# Patient Record
Sex: Female | Born: 1950 | Race: Black or African American | Hispanic: No | Marital: Married | Smoking: Former smoker
Health system: Southern US, Community
[De-identification: ages and names within clinical notes are randomized; demographics above are authoritative.]

## PROBLEM LIST (undated history)

## (undated) DIAGNOSIS — I2510 Atherosclerotic heart disease of native coronary artery without angina pectoris: Secondary | ICD-10-CM

## (undated) DIAGNOSIS — E0500 Thyrotoxicosis with diffuse goiter without thyrotoxic crisis or storm: Secondary | ICD-10-CM

## (undated) DIAGNOSIS — J45909 Unspecified asthma, uncomplicated: Secondary | ICD-10-CM

## (undated) DIAGNOSIS — K219 Gastro-esophageal reflux disease without esophagitis: Secondary | ICD-10-CM

## (undated) DIAGNOSIS — F32A Depression, unspecified: Secondary | ICD-10-CM

## (undated) DIAGNOSIS — R519 Headache, unspecified: Secondary | ICD-10-CM

## (undated) DIAGNOSIS — E785 Hyperlipidemia, unspecified: Secondary | ICD-10-CM

## (undated) DIAGNOSIS — Z87891 Personal history of nicotine dependence: Secondary | ICD-10-CM

## (undated) DIAGNOSIS — E042 Nontoxic multinodular goiter: Secondary | ICD-10-CM

## (undated) DIAGNOSIS — R072 Precordial pain: Secondary | ICD-10-CM

## (undated) DIAGNOSIS — E119 Type 2 diabetes mellitus without complications: Secondary | ICD-10-CM

## (undated) DIAGNOSIS — I10 Essential (primary) hypertension: Secondary | ICD-10-CM

## (undated) DIAGNOSIS — M1990 Unspecified osteoarthritis, unspecified site: Secondary | ICD-10-CM

## (undated) HISTORY — PX: ABDOMINAL HYSTERECTOMY: SHX81

## (undated) HISTORY — DX: Precordial pain: R07.2

## (undated) HISTORY — DX: Personal history of nicotine dependence: Z87.891

## (undated) HISTORY — PX: TONSILLECTOMY: SUR1361

## (undated) HISTORY — DX: Hyperlipidemia, unspecified: E78.5

## (undated) HISTORY — PX: EYE SURGERY: SHX253

## (undated) HISTORY — PX: CHOLECYSTECTOMY: SHX55

## (undated) HISTORY — DX: Atherosclerotic heart disease of native coronary artery without angina pectoris: I25.10

---

## 1993-03-12 DIAGNOSIS — K759 Inflammatory liver disease, unspecified: Secondary | ICD-10-CM

## 1993-03-12 HISTORY — DX: Inflammatory liver disease, unspecified: K75.9

## 2010-12-26 DIAGNOSIS — F1911 Other psychoactive substance abuse, in remission: Secondary | ICD-10-CM | POA: Insufficient documentation

## 2010-12-26 DIAGNOSIS — E119 Type 2 diabetes mellitus without complications: Secondary | ICD-10-CM | POA: Insufficient documentation

## 2010-12-26 DIAGNOSIS — F32A Depression, unspecified: Secondary | ICD-10-CM | POA: Diagnosis present

## 2010-12-26 DIAGNOSIS — M4802 Spinal stenosis, cervical region: Secondary | ICD-10-CM | POA: Insufficient documentation

## 2010-12-26 DIAGNOSIS — E785 Hyperlipidemia, unspecified: Secondary | ICD-10-CM | POA: Insufficient documentation

## 2010-12-26 DIAGNOSIS — I10 Essential (primary) hypertension: Secondary | ICD-10-CM | POA: Insufficient documentation

## 2010-12-26 DIAGNOSIS — H547 Unspecified visual loss: Secondary | ICD-10-CM | POA: Insufficient documentation

## 2011-04-17 DIAGNOSIS — H40009 Preglaucoma, unspecified, unspecified eye: Secondary | ICD-10-CM | POA: Insufficient documentation

## 2011-04-28 DIAGNOSIS — K219 Gastro-esophageal reflux disease without esophagitis: Secondary | ICD-10-CM | POA: Insufficient documentation

## 2011-06-21 DIAGNOSIS — J309 Allergic rhinitis, unspecified: Secondary | ICD-10-CM | POA: Insufficient documentation

## 2011-07-18 DIAGNOSIS — IMO0002 Reserved for concepts with insufficient information to code with codable children: Secondary | ICD-10-CM | POA: Insufficient documentation

## 2013-05-20 DIAGNOSIS — M5116 Intervertebral disc disorders with radiculopathy, lumbar region: Secondary | ICD-10-CM | POA: Insufficient documentation

## 2016-01-12 DIAGNOSIS — E042 Nontoxic multinodular goiter: Secondary | ICD-10-CM | POA: Insufficient documentation

## 2016-08-14 DIAGNOSIS — K760 Fatty (change of) liver, not elsewhere classified: Secondary | ICD-10-CM | POA: Insufficient documentation

## 2016-09-20 DIAGNOSIS — G8929 Other chronic pain: Secondary | ICD-10-CM | POA: Insufficient documentation

## 2017-02-26 ENCOUNTER — Ambulatory Visit: Payer: PRIVATE HEALTH INSURANCE

## 2017-02-28 ENCOUNTER — Ambulatory Visit: Payer: PRIVATE HEALTH INSURANCE

## 2017-03-22 ENCOUNTER — Encounter: Payer: Self-pay | Admitting: Emergency Medicine

## 2017-03-22 ENCOUNTER — Emergency Department
Admission: EM | Admit: 2017-03-22 | Discharge: 2017-03-22 | Disposition: A | Discharge status: Home / Self Care | Payer: Commercial Managed Care - HMO | Attending: Emergency Medicine | Admitting: Emergency Medicine

## 2017-03-22 ENCOUNTER — Other Ambulatory Visit: Payer: Self-pay

## 2017-03-22 DIAGNOSIS — I10 Essential (primary) hypertension: Secondary | ICD-10-CM | POA: Insufficient documentation

## 2017-03-22 DIAGNOSIS — F17210 Nicotine dependence, cigarettes, uncomplicated: Secondary | ICD-10-CM | POA: Insufficient documentation

## 2017-03-22 DIAGNOSIS — L309 Dermatitis, unspecified: Secondary | ICD-10-CM | POA: Diagnosis not present

## 2017-03-22 DIAGNOSIS — E119 Type 2 diabetes mellitus without complications: Secondary | ICD-10-CM | POA: Diagnosis not present

## 2017-03-22 DIAGNOSIS — L308 Other specified dermatitis: Secondary | ICD-10-CM | POA: Insufficient documentation

## 2017-03-22 DIAGNOSIS — R21 Rash and other nonspecific skin eruption: Secondary | ICD-10-CM | POA: Diagnosis present

## 2017-03-22 HISTORY — DX: Essential (primary) hypertension: I10

## 2017-03-22 HISTORY — DX: Type 2 diabetes mellitus without complications: E11.9

## 2017-03-22 MED ORDER — CLOTRIMAZOLE-BETAMETHASONE 1-0.05 % EX LOTN: TOPICAL_LOTION | Freq: Two times a day (BID) | CUTANEOUS | 0 refills | Status: DC

## 2017-03-22 NOTE — ED Triage Notes (Signed)
Pt reports that she fell a year ago and ever since has skin breakout since. She states that it started on the right first digit and is now on the 2nd digit also. Skin appears dry in appearance. States that the clinc was suppose to call to get her an appointment with dermatology but they have not called her.

## 2017-03-22 NOTE — ED Provider Notes (Signed)
[facility] Emergency Department Provider Note  ____________________________________________   First MD Initiated Contact with Patient 03/22/17 1303     (approximate)  I have reviewed the triage vital signs and the nursing notes.   HISTORY  Chief Complaint Finger Injury    HPI Erica Estrada is a 67 y.o. female complains of having a rash on her finger for several months, she was seen at her doctor's office 2 weeks ago and they are to refer to dermatology, she has not heard from them so she came to the ER to have it evaluated she reports that there is no drainage from the area is just dark and crackly, she denies fever chills   Past Medical History:  Diagnosis Date  . Diabetes mellitus without complication (HCC)   . Hypertension     There are no active problems to display for this patient.   Past Surgical History:  Procedure Laterality Date  . ABDOMINAL HYSTERECTOMY    . EYE SURGERY      Prior to Admission medications   Medication Sig Start Date End Date Taking? Authorizing Provider  clotrimazole-betamethasone (LOTRISONE) lotion Apply topically 2 (two) times daily. 03/22/17   Faythe Ghee, PA-C    Allergies Patient has no known allergies.  No family history on file.  Social History Social History   Tobacco Use  . Smoking status: Current Every Day Smoker    Packs/day: 0.50  Substance Use Topics  . Alcohol use: No    Frequency: Never  . Drug use: No    Review of Systems  Constitutional: No fever/chills Eyes: No visual changes. ENT: No sore throat. Respiratory: Denies cough Genitourinary: Negative for dysuria. Musculoskeletal: Negative for back pain. Skin: Positive for scaly skin on the middle and fourth finger    ____________________________________________   PHYSICAL EXAM:  VITAL SIGNS: ED Triage Vitals  Enc Vitals Group     BP 03/22/17 1242 140/73     Pulse Rate 03/22/17 1242 93     Resp 03/22/17 1242 20   Temp 03/22/17 1242 98 F (36.7 C)     Temp Source 03/22/17 1242 Oral     SpO2 03/22/17 1242 96 %     Weight 03/22/17 1244 177 lb (80.3 kg)     Height 03/22/17 1244 5\' 5"  (1.651 m)     Head Circumference --      Peak Flow --      Pain Score 03/22/17 1242 8     Pain Loc --      Pain Edu? --      Excl. in GC? --     Constitutional: Alert and oriented. Well appearing and in no acute distress. Eyes: Conjunctivae are normal.  Head: Atraumatic. Nose: No congestion/rhinnorhea. Mouth/Throat: Mucous membranes are moist.   Cardiovascular: Normal rate, regular rhythm.  Heart sounds are normal Respiratory: Normal respiratory effort.  No retractions, lungs clear to auscultation GU: deferred Musculoskeletal: FROM all extremities, warm and well perfused Neurologic:  Normal speech and language.  Skin:  Skin is warm, dry and intact.  There is dark thickened tissue on the inner aspect of the fingers that is covering the volar side of the middle finger, the area is not tender, there is no drainage, no vesicles noted neurovascular is intact Psychiatric: Mood and affect are normal. Speech and behavior are normal.  ____________________________________________   LABS (all labs ordered are listed, but only abnormal results are displayed)  Labs Reviewed - No data to display ____________________________________________  ____________________________________________  RADIOLOGY    ____________________________________________   PROCEDURES  Procedure(s) performed: No  Procedures    ____________________________________________   INITIAL IMPRESSION / ASSESSMENT AND PLAN / ED COURSE  Pertinent labs & imaging results that were available during my care of the patient were reviewed by me and considered in my medical decision making (see chart for details).  Patient is a 67 year old female who is concerned about a rash that is been on her finger for 7-8 months, there is no drainage, she saw  her family doctor 2 weeks ago and they are supposed to refer to dermatology  On physical exam the skin is rough, scaly, dark, questionable eczema versus fungal  The patient was given a prescription for Lotrisone cream to apply to the area, she is given the phone number to dermatology, she is to call for an appointment, advised her that she should call her family doctor first to see if they have already set up an appointment for her, patient states she understands comply with recommendations, she is discharged in stable condition     As part of my medical decision making, I reviewed the following data within the electronic MEDICAL RECORD NUMBER ____________________________________________   FINAL CLINICAL IMPRESSION(S) / ED DIAGNOSES  Final diagnoses:  Other eczema      NEW MEDICATIONS STARTED DURING THIS VISIT:  Discharge Medication List as of 03/22/2017  1:08 PM    START taking these medications   Details  clotrimazole-betamethasone (LOTRISONE) lotion Apply topically 2 (two) times daily., Starting Fri 03/22/2017, Print         Note:  This document was prepared using Dragon voice recognition software and may include unintentional dictation errors.    Faythe GheeFisher, Susan W, PA-C 03/22/17 1630    Jene EveryKinner, Robert, MD 03/25/17 1057

## 2017-03-22 NOTE — Discharge Instructions (Signed)
Use the medication as prescribed, follow up with dermatology

## 2017-03-26 DIAGNOSIS — L089 Local infection of the skin and subcutaneous tissue, unspecified: Secondary | ICD-10-CM | POA: Diagnosis not present

## 2017-03-26 DIAGNOSIS — L308 Other specified dermatitis: Secondary | ICD-10-CM | POA: Diagnosis not present

## 2017-04-08 DIAGNOSIS — R1110 Vomiting, unspecified: Secondary | ICD-10-CM | POA: Diagnosis not present

## 2017-04-12 ENCOUNTER — Emergency Department
Admission: EM | Admit: 2017-04-12 | Discharge: 2017-04-13 | Disposition: A | Discharge status: Home / Self Care | Payer: Medicare HMO | Attending: Emergency Medicine | Admitting: Emergency Medicine

## 2017-04-12 ENCOUNTER — Encounter: Payer: Self-pay | Admitting: Emergency Medicine

## 2017-04-12 ENCOUNTER — Other Ambulatory Visit: Payer: Self-pay

## 2017-04-12 DIAGNOSIS — R1030 Lower abdominal pain, unspecified: Secondary | ICD-10-CM | POA: Diagnosis not present

## 2017-04-12 DIAGNOSIS — R112 Nausea with vomiting, unspecified: Secondary | ICD-10-CM | POA: Diagnosis not present

## 2017-04-12 DIAGNOSIS — E119 Type 2 diabetes mellitus without complications: Secondary | ICD-10-CM | POA: Insufficient documentation

## 2017-04-12 DIAGNOSIS — F17200 Nicotine dependence, unspecified, uncomplicated: Secondary | ICD-10-CM | POA: Insufficient documentation

## 2017-04-12 DIAGNOSIS — I2510 Atherosclerotic heart disease of native coronary artery without angina pectoris: Secondary | ICD-10-CM | POA: Insufficient documentation

## 2017-04-12 DIAGNOSIS — I10 Essential (primary) hypertension: Secondary | ICD-10-CM | POA: Insufficient documentation

## 2017-04-12 DIAGNOSIS — R197 Diarrhea, unspecified: Secondary | ICD-10-CM | POA: Insufficient documentation

## 2017-04-12 DIAGNOSIS — R110 Nausea: Secondary | ICD-10-CM | POA: Diagnosis not present

## 2017-04-12 DIAGNOSIS — R1010 Upper abdominal pain, unspecified: Secondary | ICD-10-CM | POA: Diagnosis not present

## 2017-04-12 DIAGNOSIS — Z794 Long term (current) use of insulin: Secondary | ICD-10-CM | POA: Insufficient documentation

## 2017-04-12 LAB — BASIC METABOLIC PANEL
Anion gap: 11 (ref 5–15)
BUN: 19 mg/dL (ref 6–20)
CHLORIDE: 103 mmol/L (ref 101–111)
CO2: 21 mmol/L — AB (ref 22–32)
CREATININE: 1.09 mg/dL — AB (ref 0.44–1.00)
Calcium: 8.8 mg/dL — ABNORMAL LOW (ref 8.9–10.3)
GFR calc non Af Amer: 51 mL/min — ABNORMAL LOW (ref >60)
GFR, EST AFRICAN AMERICAN: 59 mL/min — AB (ref >60)
GLUCOSE: 270 mg/dL — AB (ref 65–99)
Potassium: 3.2 mmol/L — ABNORMAL LOW (ref 3.5–5.1)
Sodium: 135 mmol/L (ref 135–145)

## 2017-04-12 LAB — CBC
HCT: 37 % (ref 35.0–47.0)
Hemoglobin: 11.8 g/dL — ABNORMAL LOW (ref 12.0–16.0)
MCH: 26.4 pg (ref 26.0–34.0)
MCHC: 32 g/dL (ref 32.0–36.0)
MCV: 82.4 fL (ref 80.0–100.0)
PLATELETS: 255 10*3/uL (ref 150–440)
RBC: 4.49 MIL/uL (ref 3.80–5.20)
RDW: 15.8 % — ABNORMAL HIGH (ref 11.5–14.5)
WBC: 19.7 10*3/uL — ABNORMAL HIGH (ref 3.6–11.0)

## 2017-04-12 LAB — URINALYSIS, COMPLETE (UACMP) WITH MICROSCOPIC
Bilirubin Urine: NEGATIVE
Ketones, ur: NEGATIVE mg/dL
Leukocytes, UA: NEGATIVE
Nitrite: NEGATIVE
PROTEIN: NEGATIVE mg/dL
Specific Gravity, Urine: 1.025 (ref 1.005–1.030)
pH: 5 (ref 5.0–8.0)

## 2017-04-12 MED ORDER — ONDANSETRON HCL 4 MG/2ML IJ SOLN
4.0000 mg | Freq: Once | INTRAMUSCULAR | Status: DC
  Filled 2017-04-12: qty 2

## 2017-04-12 MED ORDER — SODIUM CHLORIDE 0.9 % IV BOLUS (SEPSIS): 1000.0000 mL | Freq: Once | INTRAVENOUS | Status: DC

## 2017-04-12 NOTE — ED Notes (Signed)
[facility] stat [facility] [employer] [facility].  Per EMS patient reported similar episode 3 days ago that lasted approximately 20-30 minutes.  VS:  HR - 94, BP - 124/72; pulse oxi 90% on room air; cbg - 326

## 2017-04-12 NOTE — ED Provider Notes (Signed)
[facility] Emergency Department Provider Note  ____________________________________________   First MD Initiated Contact with Patient 04/12/17 2335     (approximate)  I have reviewed the triage vital signs and the nursing notes.   HISTORY  Chief Complaint Emesis and Weakness   HPI Erica Fairyatricia Brucato is a 67 y.o. female who resents emergency department via EMS with nausea vomiting and upper abdominal pain that began at dinner tonight [place] [facility] [employer].  She began to eat her meal and she felt immediate severe nausea.  She stood up felt lightheaded and felt she might pass out.  She had cramping upper abdominal discomfort.  Nonradiating.  Her symptoms have nearly completely resolved now that she is here in the hospital.  No one else had identical symptoms.  She denies diarrhea.  She denies chest pain shortness of breath or palpitations.  Past Medical History:  Diagnosis Date  . Diabetes mellitus without complication (HCC)   . Hypertension     There are no active problems to display for this patient.   Past Surgical History:  Procedure Laterality Date  . ABDOMINAL HYSTERECTOMY    . EYE SURGERY      Prior to Admission medications   Medication Sig Start Date End Date Taking? Authorizing Provider  amLODipine (NORVASC) 10 MG tablet Take 1 tablet by mouth daily. 03/26/17  Yes [provider]  atorvastatin (LIPITOR) 80 MG tablet Take 1 tablet by mouth daily. 03/26/17  Yes [provider]  betamethasone dipropionate (DIPROLENE) 0.05 % cream Apply 1 application topically daily as needed. 04/01/17  Yes [provider]  clotrimazole-betamethasone (LOTRISONE) lotion Apply topically 2 (two) times daily. 03/22/17  Yes Fisher, Roselyn BeringSusan W, PA-C  JARDIANCE 10 MG TABS tablet Take 1 tablet by mouth daily. 03/26/17  Yes [provider]  metformin (FORTAMET) 1000 MG (OSM) 24 hr tablet Take 1 tablet by mouth 2 (two) times daily with a meal.   03/21/17  Yes [provider]  naproxen (NAPROSYN) 500 MG tablet Take 1 tablet by mouth 2 (two) times daily. 03/26/17  Yes [provider]  NOVOLOG FLEXPEN 100 UNIT/ML FlexPen Inject 20 Units into the skin 3 (three) times daily with meals. Plus sliding scale for up to 100 units TDD 03/22/17  Yes [provider]  omeprazole (PRILOSEC) 40 MG capsule Take 1 capsule by mouth daily. 04/08/17  Yes [provider]  SPIRIVA HANDIHALER 18 MCG inhalation capsule Place 1 capsule into inhaler and inhale daily. 03/22/17  Yes [provider]  TRESIBA FLEXTOUCH 200 UNIT/ML SOPN Inject 60 Units into the skin daily. 04/02/17  Yes [provider]  VENTOLIN HFA 108 (90 Base) MCG/ACT inhaler Inhale 2 puffs into the lungs every 6 (six) hours as needed. 03/26/17  Yes [provider]  insulin lispro (HUMALOG KWIKPEN) 100 UNIT/ML KiwkPen Inject 20 Units into the skin 3 (three) times daily. Plus sliding scale for up to 100 units TDD 04/18/16 04/18/17  [provider]  ondansetron (ZOFRAN ODT) 4 MG disintegrating tablet Take 1 tablet (4 mg total) by mouth every 8 (eight) hours as needed for nausea or vomiting. 04/13/17   Merrily Brittleifenbark, Haven Foss, MD    Allergies Patient has no known allergies.  No family history on file.  Social History Social History   Tobacco Use  . Smoking status: Current Every Day Smoker    Packs/day: 0.50  . Smokeless tobacco: Never Used  Substance Use Topics  . Alcohol use: No    Frequency: Never  .  Drug use: No    Review of Systems Constitutional: No fever/chills Eyes: No visual changes. ENT: No sore throat. Cardiovascular: Denies chest pain. Respiratory: Denies shortness of breath. Gastrointestinal: Positive for abdominal pain.  Positive for nausea, no vomiting.  No diarrhea.  No constipation. Genitourinary: Negative for dysuria. Musculoskeletal: Negative for back pain. Skin: Negative for rash. Neurological: Negative for  headaches, focal weakness or numbness.   ____________________________________________   PHYSICAL EXAM:  VITAL SIGNS: ED Triage Vitals  Enc Vitals Group     BP 04/12/17 1918 134/64     Pulse Rate 04/12/17 1918 94     Resp 04/12/17 1918 20     Temp 04/12/17 1918 99.1 F (37.3 C)     Temp Source 04/12/17 1918 Oral     SpO2 04/12/17 1918 94 %     Weight 04/12/17 1915 177 lb (80.3 kg)     Height 04/12/17 1915 5\' 5"  (1.651 m)     Head Circumference --      Peak Flow --      Pain Score 04/12/17 1915 9     Pain Loc --      Pain Edu? --      Excl. in GC? --     Constitutional: Alert and oriented x4 pleasant cooperative speaks in full clear sentences no diaphoresis Eyes: PERRL EOMI. Head: Atraumatic. Nose: No congestion/rhinnorhea. Mouth/Throat: No trismus Neck: No stridor.   Cardiovascular: Normal rate, regular rhythm. Grossly normal heart sounds.  Good peripheral circulation. Respiratory: Normal respiratory effort.  No retractions. Lungs CTAB and moving good air Gastrointestinal: Soft abdomen mild upper tenderness with no rebound or guarding no peritonitis no McBurney's tenderness negative Rovsing's no costovertebral tenderness Musculoskeletal: No lower extremity edema   Neurologic:  Normal speech and language. No gross focal neurologic deficits are appreciated. Skin:  Skin is warm, dry and intact. No rash noted. Psychiatric: Mood and affect are normal. Speech and behavior are normal.    ____________________________________________   DIFFERENTIAL includes but not limited to  Biliary colic, cholecystitis, cholangitis, appendicitis, diverticulitis, dehydration, food poisoning ____________________________________________   LABS (all labs ordered are listed, but only abnormal results are displayed)  Labs Reviewed  BASIC METABOLIC PANEL - Abnormal; Notable for the following components:      Result Value   Potassium 3.2 (*)    CO2 21 (*)    Glucose, Bld 270 (*)     Creatinine, Ser 1.09 (*)    Calcium 8.8 (*)    GFR calc non Af Amer 51 (*)    GFR calc Af Amer 59 (*)    All other components within normal limits  CBC - Abnormal; Notable for the following components:   WBC 19.7 (*)    Hemoglobin 11.8 (*)    RDW 15.8 (*)    All other components within normal limits  URINALYSIS, COMPLETE (UACMP) WITH MICROSCOPIC - Abnormal; Notable for the following components:   Color, Urine YELLOW (*)    APPearance CLOUDY (*)    Glucose, UA >=500 (*)    Hgb urine dipstick SMALL (*)    Bacteria, UA RARE (*)    Squamous Epithelial / LPF 0-5 (*)    All other components within normal limits  HEPATIC FUNCTION PANEL - Abnormal; Notable for the following components:   ALT 10 (*)    Bilirubin, Direct <0.1 (*)    All other components within normal limits  GLUCOSE, CAPILLARY - Abnormal; Notable for the following components:   Glucose-Capillary 253 (*)  All other components within normal limits  LIPASE, BLOOD  CBG MONITORING, ED    Lab work reviewed by me with elevated white count which is nonspecific and could be secondary to infection versus pain __________________________________________  EKG  ED ECG REPORT I, Merrily Brittle, the attending physician, personally viewed and interpreted this ECG.  Date: 04/12/2017 EKG Time:  Rate: 95 Rhythm: normal sinus rhythm QRS Axis: Rightward axis Intervals: normal ST/T Wave abnormalities: normal Narrative Interpretation: no evidence of acute ischemia  ____________________________________________  RADIOLOGY  CT abdomen pelvis reviewed by me with no acute disease ____________________________________________   PROCEDURES  Procedure(s) performed: no  Procedures  Critical Care performed: no  Observation: no ____________________________________________   INITIAL IMPRESSION / ASSESSMENT AND PLAN / ED COURSE  Pertinent labs & imaging results that were available during my care of the patient were reviewed by  me and considered in my medical decision making (see chart for details).  Currently the patient is relatively asymptomatic and well-appearing, however postprandial abdominal pain diarrhea vomiting and diaphoresis is concerning.  She does have a white count of 20.  CT abdomen pelvis is pending now.    ----------------------------------------- 1:26 AM on 04/13/2017 -----------------------------------------  Fortunately the patient's LFTs and CT scan were reassuring.  Her symptoms are completely resolved.  She is able to eat and drink.  She likely had food poisoning from dinner.  I will discharge her home with a short course of Zofran and strict return precautions.  She verbalizes understanding and agreement with the plan.  ____________________________________________   FINAL CLINICAL IMPRESSION(S) / ED DIAGNOSES  Final diagnoses:  Nausea vomiting and diarrhea      NEW MEDICATIONS STARTED DURING THIS VISIT:  Discharge Medication List as of 04/13/2017  1:26 AM    START taking these medications   Details  ondansetron (ZOFRAN ODT) 4 MG disintegrating tablet Take 1 tablet (4 mg total) by mouth every 8 (eight) hours as needed for nausea or vomiting., Starting Sat 04/13/2017, Print         Note:  This document was prepared using Dragon voice recognition software and may include unintentional dictation errors.     Merrily Brittle, MD 04/14/17 [phone]

## 2017-04-12 NOTE — ED Triage Notes (Signed)
Pt reports that she had some red emesis "the week before last" but felt like she was getting better. [facility] [employer] and was feeling nauseated and felt like she was going to pass out. Pt reports extreme weakness without LOC. Pt is diabetic and reports EMS CBG of 236. Pt is in NAD.

## 2017-04-12 NOTE — ED Notes (Signed)
ED Provider at bedside. 

## 2017-04-13 ENCOUNTER — Encounter: Payer: Self-pay | Admitting: Radiology

## 2017-04-13 ENCOUNTER — Emergency Department: Payer: Medicare HMO

## 2017-04-13 DIAGNOSIS — R110 Nausea: Secondary | ICD-10-CM | POA: Diagnosis not present

## 2017-04-13 LAB — HEPATIC FUNCTION PANEL
ALK PHOS: 104 U/L (ref 38–126)
ALT: 10 U/L — AB (ref 14–54)
AST: 31 U/L (ref 15–41)
Albumin: 3.7 g/dL (ref 3.5–5.0)
BILIRUBIN TOTAL: 0.3 mg/dL (ref 0.3–1.2)
Bilirubin, Direct: <0.1 mg/dL — ABNORMAL LOW (ref 0.1–0.5)
Total Protein: 7.8 g/dL (ref 6.5–8.1)

## 2017-04-13 LAB — LIPASE, BLOOD: Lipase: 23 U/L (ref 11–51)

## 2017-04-13 MED ORDER — IOPAMIDOL (ISOVUE-300) INJECTION 61%
100.0000 mL | Freq: Once | INTRAVENOUS | Status: AC | PRN
  Administered 2017-04-13: 100 mL via INTRAVENOUS

## 2017-04-13 MED ORDER — ONDANSETRON 4 MG PO TBDP: 4.0000 mg | ORAL_TABLET | Freq: Three times a day (TID) | ORAL | 0 refills | Status: DC | PRN

## 2017-04-13 NOTE — Discharge Instructions (Signed)
Fortunately today your blood work and your CT scan were reassuring.  Please take your nausea medication as needed for severe symptoms and follow-up with primary care as needed.  Return to the emergency department for any new or worsening symptoms such as fevers, chills, if you cannot eat or drink, or for any other issues whatsoever.  It was a pleasure to take care of you today, and thank you for coming to our emergency department.  If you have any questions or concerns before leaving please ask the nurse to grab me and I'm more than happy to go through your aftercare instructions again.  If you were prescribed any opioid pain medication today such as Norco, Vicodin, Percocet, morphine, hydrocodone, or oxycodone please make sure you do not drive when you are taking this medication as it can alter your ability to drive safely.  If you have any concerns once you are home that you are not improving or are in fact getting worse before you can make it to your follow-up appointment, please do not hesitate to call [phone] and come back for further evaluation.  Merrily BrittleNeil Tyrion Glaude, MD  Results for orders placed or performed during the hospital encounter of 04/12/17  Basic metabolic panel  Result Value Ref Range   Sodium 135 135 - 145 mmol/L   Potassium 3.2 (L) 3.5 - 5.1 mmol/L   Chloride 103 101 - 111 mmol/L   CO2 21 (L) 22 - 32 mmol/L   Glucose, Bld 270 (H) 65 - 99 mg/dL   BUN 19 6 - 20 mg/dL   Creatinine, Ser 1.661.09 (H) 0.44 - 1.00 mg/dL   Calcium 8.8 (L) 8.9 - 10.3 mg/dL   GFR calc non Af Amer 51 (L) >60 mL/min   GFR calc Af Amer 59 (L) >60 mL/min   Anion gap 11 5 - 15  CBC  Result Value Ref Range   WBC 19.7 (H) 3.6 - 11.0 K/uL   RBC 4.49 3.80 - 5.20 MIL/uL   Hemoglobin 11.8 (L) 12.0 - 16.0 g/dL   HCT 06.337.0 01.635.0 - 01.047.0 %   MCV 82.4 80.0 - 100.0 fL   MCH 26.4 26.0 - 34.0 pg   MCHC 32.0 32.0 - 36.0 g/dL   RDW 93.215.8 (H) 35.511.5 - 73.214.5 %   Platelets 255 150 - 440 K/uL  Urinalysis, Complete w Microscopic    Result Value Ref Range   Color, Urine YELLOW (A) YELLOW   APPearance CLOUDY (A) CLEAR   Specific Gravity, Urine 1.025 1.005 - 1.030   pH 5.0 5.0 - 8.0   Glucose, UA >=500 (A) NEGATIVE mg/dL   Hgb urine dipstick SMALL (A) NEGATIVE   Bilirubin Urine NEGATIVE NEGATIVE   Ketones, ur NEGATIVE NEGATIVE mg/dL   Protein, ur NEGATIVE NEGATIVE mg/dL   Nitrite NEGATIVE NEGATIVE   Leukocytes, UA NEGATIVE NEGATIVE   RBC / HPF 6-30 0 - 5 RBC/hpf   WBC, UA 6-30 0 - 5 WBC/hpf   Bacteria, UA RARE (A) NONE SEEN   Squamous Epithelial / LPF 0-5 (A) NONE SEEN  Lipase, blood  Result Value Ref Range   Lipase 23 11 - 51 U/L  Hepatic function panel  Result Value Ref Range   Total Protein 7.8 6.5 - 8.1 g/dL   Albumin 3.7 3.5 - 5.0 g/dL   AST 31 15 - 41 U/L   ALT 10 (L) 14 - 54 U/L   Alkaline Phosphatase 104 38 - 126 U/L   Total Bilirubin 0.3 0.3 - 1.2 mg/dL  Bilirubin, Direct <0.1 (L) 0.1 - 0.5 mg/dL   Indirect Bilirubin NOT CALCULATED 0.3 - 0.9 mg/dL   Ct Abdomen Pelvis W Contrast  Result Date: 04/13/2017 CLINICAL DATA:  Presents from [employer] with nausea. Similar episode 3 days ago. History of hypertension and diabetes. EXAM: CT ABDOMEN AND PELVIS WITH CONTRAST TECHNIQUE: Multidetector CT imaging of the abdomen and pelvis was performed using the standard protocol following bolus administration of intravenous contrast. CONTRAST:  ISOVUE-300 IOPAMIDOL (ISOVUE-300) INJECTION 61% COMPARISON:  None. FINDINGS: LOWER CHEST: Patchy ground-glass opacities bilateral lung bases. Included heart size is normal. Mild coronary artery calcifications. No pericardial effusion. HEPATOBILIARY: Normal gallbladder. Tiny hypodensities in the liver, too small to characterize. Liver otherwise unremarkable. PANCREAS: Normal. SPLEEN: Normal. ADRENALS/URINARY TRACT: Kidneys are orthotopic, demonstrating symmetric enhancement. No nephrolithiasis, hydronephrosis or solid renal masses. The unopacified ureters are normal in  course and caliber. Delayed imaging through the kidneys demonstrates symmetric prompt contrast excretion within the proximal urinary collecting system. Urinary bladder is partially distended and unremarkable. Normal adrenal glands. STOMACH/BOWEL: The stomach, small and large bowel are normal in course and caliber without inflammatory changes. Normal appendix. VASCULAR/LYMPHATIC: Aortoiliac vessels are normal in course and caliber. Moderate calcific atherosclerosis aortoiliac vessels. No lymphadenopathy by CT size criteria. Phleboliths in the pelvis. REPRODUCTIVE: Status post hysterectomy. OTHER: No intraperitoneal free fluid or free air. MUSCULOSKELETAL: Nonacute. 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 Anterior abdominal wall scarring. IMPRESSION: 1. Moderate amount of retained large bowel stool without bowel obstruction or acute intra-abdominal/pelvic process. 2. Bilateral included lung non-specific ground-glass consolidation seen with pneumonia or pulmonary edema. Aortic Atherosclerosis (ICD10-I70.0). Electronically Signed   By: Awilda Metro M.D.   On: 04/13/2017 01:17

## 2017-04-14 LAB — GLUCOSE, CAPILLARY: GLUCOSE-CAPILLARY: 253 mg/dL — AB (ref 65–99)

## 2017-04-15 ENCOUNTER — Other Ambulatory Visit: Payer: Self-pay

## 2017-04-15 ENCOUNTER — Ambulatory Visit: Payer: Medicare HMO | Attending: Family Medicine

## 2017-04-15 VITALS — BP 110/66 | HR 89

## 2017-04-15 DIAGNOSIS — M25512 Pain in left shoulder: Secondary | ICD-10-CM | POA: Insufficient documentation

## 2017-04-15 DIAGNOSIS — M5412 Radiculopathy, cervical region: Secondary | ICD-10-CM | POA: Insufficient documentation

## 2017-04-15 DIAGNOSIS — G8929 Other chronic pain: Secondary | ICD-10-CM | POA: Diagnosis not present

## 2017-04-15 DIAGNOSIS — M6281 Muscle weakness (generalized): Secondary | ICD-10-CM

## 2017-04-15 NOTE — Therapy (Signed)
[facility] PHYSICAL AND SPORTS MEDICINE [address] [place], [address] Phone: [phone]   Fax:  [phone]  Physical Therapy Evaluation  Patient Details  Name: Erica Estrada MRN: [id number] Date of Birth: Aug 18, 1950 Referring Provider: Armstead Peaks FNP   Encounter Date: 04/15/2017  PT End of Session - 04/15/17 1741    Visit Number  1    Number of Visits  13    Date for PT Re-Evaluation  05/27/17    PT Start Time  0935    PT Stop Time  1030    PT Time Calculation (min)  55 min    Activity Tolerance  Patient tolerated treatment well    Behavior During Therapy  [facility] for tasks assessed/performed       Past Medical History:  Diagnosis Date  . Diabetes mellitus without complication (HCC)   . Hypertension     Past Surgical History:  Procedure Laterality Date  . ABDOMINAL HYSTERECTOMY    . EYE SURGERY      Vitals:   04/15/17 1105  BP: 110/66  Pulse: 89     Subjective Assessment - 04/15/17 1114    Subjective  Patient reports L UE pain with insidious onset about 7 months ago. Pt reports no cervical pain. Pt reports pain travels from L shoulder to just proximal to L wrist, but reports no numbness or tingling. Pt reports pain is currently 5/10.  Rates worst pain as 10/10 and states best pain as 4-5/10. Pt states she does not know what aggravates sx but that resting her L UE decreases pain within 15-20 minutes.    Limitations  Lifting    Currently in Pain?  Yes    Pain Score  5     Pain Location  Shoulder    Pain Orientation  Left    Pain Descriptors / Indicators  Aching    Pain Type  Chronic pain    Pain Radiating Towards  down L UE toward wrist    Pain Onset  More than a month ago    Pain Frequency  Constant    Aggravating Factors   household chores    Pain Relieving Factors  rest         [facility] PT Assessment - 04/15/17 0936      Assessment   Medical Diagnosis  L elbow/arm/shoulder pain    Referring Provider   Armstead Peaks FNP    Hand Dominance  Right    Next MD Visit  unknown    Prior Therapy  yes, for LBP      Balance Screen   Has the patient fallen in the past 6 months  Yes    How many times?  2    Has the patient had a decrease in activity level because of a fear of falling?   Yes      Prior Function   Level of Independence  Independent    Vocation  Retired    Leisure  Special educational needs teacher   Overall Cognitive Status  Within Functional Limits for tasks assessed      Observation/Other Assessments   Quick DASH   61%      ROM / Strength   AROM / PROM / Strength  AROM;Strength      AROM   Overall AROM Comments  L shoulder impaired and painful; R UE WNL    AROM Assessment Site  Shoulder;Elbow;Cervical    Right/Left Shoulder  Right;Left  Right Shoulder Flexion  170 Degrees    Right Shoulder ABduction  170 Degrees    Left Shoulder Extension  -- full range, painful    Left Shoulder ABduction  110 Degrees    Left Shoulder Internal Rotation  -- limited by pain    Left Shoulder External Rotation  -- limited by pain    Left Shoulder Horizontal ABduction  -- limited painful    Right/Left Elbow  Right;Left    Cervical Flexion  WNL    Cervical Extension  WNL with pain at end range    Cervical - Right Side Bend  WNL    Cervical - Left Side Bend  WNL with pain at end range    Cervical - Right Rotation  WNL    Cervical - Left Rotation  WNL with pain at end range      Strength   Strength Assessment Site  Shoulder;Elbow    Right/Left Shoulder  Right;Left    Right Shoulder Flexion  5/5    Right Shoulder Internal Rotation  5/5    Right Shoulder External Rotation  5/5    Left Shoulder Flexion  4/5 painful    Left Shoulder ABduction  4-/5 painful    Left Shoulder Internal Rotation  4+/5 painful    Left Shoulder External Rotation  4+/5 weak but not painful    Right/Left Elbow  Right;Left    Right Elbow Flexion  5/5    Right Elbow Extension  5/5    Left Elbow Flexion  5/5    Left  Elbow Extension  5/5      Palpation   Palpation comment  C7, L RTC, L upper trap, and L cervical extensors TTP      Spurling's   Findings  Positive    Side  Left       Treatment- Standing scapular squeezes - 1x20 to decrease pain Seated, scapular retraction with RTB - 1x20 to decrease pain and increase endurance of the scapular retractor musculature  Patient demonstrates decreased pain at end of session.      PT Education - 04/15/17 1514    Education provided  Yes    Education Details  HEP: Scapular retraction with RTB    Person(s) Educated  Patient    Methods  Explanation;Demonstration;Verbal cues;Tactile cues    Comprehension  Verbalized understanding;Returned demonstration          PT Long Term Goals - 04/15/17 1716      PT LONG TERM GOAL #1   Title  Patient will be independent with HEP to continue the benefits of therapy after discharge from therapy.    Baseline  Pt requires cueing with HEP    Time  6    Period  Weeks    Status  New    Target Date  05/27/17      PT LONG TERM GOAL #2   Title  Patient's worst pain will decrease from 10/10 to 5/10 in order for patient to perform household chores without increased pain.    Baseline  Pt's activity is currently limited secondary to increased L shoulder and UE pain.    Time  6    Period  Weeks    Status  New    Target Date  05/27/17      PT LONG TERM GOAL #3   Title  Patient's QuickDash score will decrease from 65% to 40% to indicate improvement in L shoulder function and pain so patient will be able to prepare  food without increased pain.    Baseline  Pt has increased L shoulder pain with preparing food.    Time  6    Period  Weeks    Status  New    Target Date  05/27/17      PT LONG TERM GOAL #4   Title  --    Baseline  --    Time  --    Period  --    Status  --    Target Date  --             Plan - 04/15/17 1515    Clinical Impression Statement  Pt is 67 yo, right hand-dominant female  presenting with L shoulder pain that radiates down the L UE of insidious onset ~7 months ago.  Pt demonstrates L cervical and shoulder dysfunction as indicated by increased pain and decreased strength with MMT of the L UE and with increased pain with shoulder flexion, abduction, and IR; patient also demonstrates increased pain with cervical extension, left cervical lateral flexion and cervical rotation to the left.  Pt. also demonstrates a positive Spurlings test on the left.  The aforementioned symptoms are consistent with cervical radiculopathy with referred pain down the L UE.  Pt will benefit from further skilled therapy to return to prior levels of function.    History and Personal Factors relevant to plan of care:  diabetes, RA, high blood pressure, Hepatitis A/B/C, history of depression    Clinical Presentation  Stable    Clinical Presentation due to:  L shoulder pain with radiating L UE not improving    Rehab Potential  Fair    Clinical Impairments Affecting Rehab Potential  + highly motivated ; - chronicity of pain    PT Frequency  2x / week    PT Duration  6 weeks    PT Treatment/Interventions  ADLs/Self Care Home Management;Cryotherapy;Electrical Stimulation;Iontophoresis 4mg /ml Dexamethasone;Moist Heat;Traction;Ultrasound;Parrafin;Fluidtherapy;Contrast Bath;Functional mobility training;Therapeutic activities;Therapeutic exercise;Neuromuscular re-education;Patient/family education;Manual techniques;Passive range of motion;Dry needling;Energy conservation;Splinting;Taping    PT Next Visit Plan  manual therapy, strength and endurance exercises    PT Home Exercise Plan  see education     Consulted and Agree with Plan of Care  Patient       Patient will benefit from skilled therapeutic intervention in order to improve the following deficits and impairments:  Decreased range of motion, Abnormal gait, Increased fascial restricitons, Improper body mechanics, Pain, Decreased coordination, Decreased  mobility, Increased muscle spasms, Postural dysfunction, Decreased activity tolerance, Decreased endurance, Decreased strength, Hypomobility, Impaired UE functional use, Impaired flexibility  Visit Diagnosis: Chronic left shoulder pain  Muscle weakness (generalized)  Radiculopathy, cervical region     Problem List There are no active problems to display for this patient.   Temple PaciniHaley Kyisha Fowle, SPT 04/15/2017, 5:50 PM  [facility] [address] [place], [place], [address] Phone: [phone]   Fax:  [phone]  Name: Erica Estrada MRN: [id number] Date of Birth: [birth year]

## 2017-04-17 ENCOUNTER — Encounter: Payer: Self-pay | Admitting: Physical Therapy

## 2017-04-17 ENCOUNTER — Ambulatory Visit: Payer: Medicare HMO | Admitting: Physical Therapy

## 2017-04-17 DIAGNOSIS — M25512 Pain in left shoulder: Secondary | ICD-10-CM | POA: Diagnosis not present

## 2017-04-17 DIAGNOSIS — M6281 Muscle weakness (generalized): Secondary | ICD-10-CM | POA: Diagnosis not present

## 2017-04-17 DIAGNOSIS — M5412 Radiculopathy, cervical region: Secondary | ICD-10-CM | POA: Diagnosis not present

## 2017-04-17 DIAGNOSIS — G8929 Other chronic pain: Secondary | ICD-10-CM

## 2017-04-17 NOTE — Therapy (Signed)
[facility] PHYS[facility]ICINE [address] [place], [place], [address] Phone: [phone]   Fax:  [phone]  Physical Therapy Treatment  Patient Details  Name: Erica Estrada MRN: [id number] Date of Birth: [birth year] Referring Provider: Armstead PeaksHeadrick, Emiliy FNP   Encounter Date: 04/17/2017  PT End of Session - 04/17/17 0930    Visit Number  2    Number of Visits  13    Date for PT Re-Evaluation  05/27/17    PT Start Time  0930    PT Stop Time  1011    PT Time Calculation (min)  41 min    Activity Tolerance  Patient tolerated treatment well    Behavior During Therapy  [facility] for tasks assessed/performed       Past Medical History:  Diagnosis Date  . Diabetes mellitus without complication (HCC)   . Hypertension     Past Surgical History:  Procedure Laterality Date  . ABDOMINAL HYSTERECTOMY    . EYE SURGERY      There were no vitals filed for this visit.  Subjective Assessment - 04/17/17 0933    Subjective  Pt reports her symptoms seems to have improved since last session.  She feels like her pain is mainly down in her L forearm now.      Pertinent History  diabetes, hypertension, RA, Hepatitis    Limitations  Lifting    Patient Stated Goals  To decrease pain    Currently in Pain?  Yes    Pain Score  4     Pain Location  -- forearm    Pain Orientation  Left    Pain Descriptors / Indicators  Aching    Pain Onset  More than a month ago       TREATMENT   Manual Therapy:   Gentle cervical distraction in supine 3x30 seconds, pt reports decrease in symptoms with this   Bil UT manual stretch 2x30 seconds each UE   STM L UT and Bil cervical paraspinals   Pt reports decrease in pain following manual therapy techniques      Therapeutic Exercise:   Sitting L UT stretch 4x30 seconds (added to HEP)   Standing Bil shoulder ER with GTB 2x10 with cues for scapular squeeze   Standing scapular retraction with RTB 2x10  with demonstration and cues for proper form and technique   Standing low row with RTB 2x10 with demonstration and cues for proper form and technique                        PT Education - 04/17/17 0930    Education provided  Yes    Education Details  Exercise technique    Person(s) Educated  Patient    Methods  Explanation;Verbal cues    Comprehension  Verbalized understanding;Returned demonstration;Verbal cues required;Need further instruction          PT Long Term Goals - 04/15/17 1716      PT LONG TERM GOAL #1   Title  Patient will be independent with HEP to continue the benefits of therapy after discharge from therapy.    Baseline  Pt requires cueing with HEP    Time  6    Period  Weeks    Status  New    Target Date  05/27/17      PT LONG TERM GOAL #2   Title  Patient's worst pain will decrease from 10/10 to 5/10 in  order for patient to perform household chores without increased pain.    Baseline  Pt's activity is currently limited secondary to increased L shoulder and UE pain.    Time  6    Period  Weeks    Status  New    Target Date  05/27/17      PT LONG TERM GOAL #3   Title  Patient's QuickDash score will decrease from 65% to 40% to indicate improvement in L shoulder function and pain so patient will be able to prepare food without increased pain.    Baseline  Pt has increased L shoulder pain with preparing food.    Time  6    Period  Weeks    Status  New    Target Date  05/27/17      PT LONG TERM GOAL #4   Title  --    Baseline  --    Time  --    Period  --    Status  --    Target Date  --            Plan - 04/17/17 1610    Clinical Impression Statement  Pt demonstrates significant muscle tension in L UT and Bil paraspinal regions.  STM provided to these areas with pt reporting decrease in pain following.  Pt responded positively to gentle cervical distraction as well.  Introduced L UT stretch to address tight musculature.  Pt  will benfit from continued skilled PT interventions for decreased pain and improved QOL.     Rehab Potential  Fair    Clinical Impairments Affecting Rehab Potential  + highly motivated ; - chronicity of pain    PT Frequency  2x / week    PT Duration  6 weeks    PT Treatment/Interventions  ADLs/Self Care Home Management;Cryotherapy;Electrical Stimulation;Iontophoresis 4mg /ml Dexamethasone;Moist Heat;Traction;Ultrasound;Parrafin;Fluidtherapy;Contrast Bath;Functional mobility training;Therapeutic activities;Therapeutic exercise;Neuromuscular re-education;Patient/family education;Manual techniques;Passive range of motion;Dry needling;Energy conservation;Splinting;Taping    PT Next Visit Plan  manual therapy, strength and endurance exercises    PT Home Exercise Plan  see education; UT stretch added on 2/6    Consulted and Agree with Plan of Care  Patient       Patient will benefit from skilled therapeutic intervention in order to improve the following deficits and impairments:  Decreased range of motion, Abnormal gait, Increased fascial restricitons, Improper body mechanics, Pain, Decreased coordination, Decreased mobility, Increased muscle spasms, Postural dysfunction, Decreased activity tolerance, Decreased endurance, Decreased strength, Hypomobility, Impaired UE functional use, Impaired flexibility  Visit Diagnosis: Chronic left shoulder pain  Muscle weakness (generalized)  Radiculopathy, cervical region     Problem List There are no active problems to display for this patient.  Encarnacion Chu PT, DPT 04/17/2017, 10:08 AM  [facility] PHYSICAL AND SPORTS MEDICINE [address] [place], [address] Phone: [phone]   Fax:  [phone]  Name: Erica Estrada MRN: [id number] Date of Birth: [birth year]

## 2017-04-22 ENCOUNTER — Ambulatory Visit: Payer: Medicare HMO

## 2017-04-22 DIAGNOSIS — M6281 Muscle weakness (generalized): Secondary | ICD-10-CM

## 2017-04-22 DIAGNOSIS — M25512 Pain in left shoulder: Secondary | ICD-10-CM | POA: Diagnosis not present

## 2017-04-22 DIAGNOSIS — M5412 Radiculopathy, cervical region: Secondary | ICD-10-CM | POA: Diagnosis not present

## 2017-04-22 DIAGNOSIS — G8929 Other chronic pain: Secondary | ICD-10-CM

## 2017-04-22 NOTE — Therapy (Signed)
[facility] [address] [place], [place], [address] Phone: [phone]   Fax:  [phone]  Physical Therapy Treatment  Patient Details  Name: Erica Estrada MRN: [id number] Date of Birth: 1950-08-19 Referring Provider: Armstead PeaksHeadrick, Emiliy FNP   Encounter Date: 04/22/2017  PT End of Session - 04/22/17 1609    Visit Number  3    Number of Visits  13    Date for PT Re-Evaluation  05/27/17    PT Start Time  1609    PT Stop Time  1650    PT Time Calculation (min)  41 min    Activity Tolerance  Patient tolerated treatment well    Behavior During Therapy  [facility] for tasks assessed/performed       Past Medical History:  Diagnosis Date  . Diabetes mellitus without complication (HCC)   . Hypertension     Past Surgical History:  Procedure Laterality Date  . ABDOMINAL HYSTERECTOMY    . EYE SURGERY      There were no vitals filed for this visit.  Subjective Assessment - 04/22/17 1610    Subjective  Pt states having to go the the ER last Friday due to eating something. Feels a lot better now. Able to raise her L arm up a little higher. Still has pain in her L forearm and posterior arm.     Pertinent History  diabetes, hypertension, RA, Hepatitis    Limitations  Lifting    Patient Stated Goals  To decrease pain    Currently in Pain?  Yes    Pain Score  0-No pain    Pain Onset  More than a month ago                              PT Education - 04/22/17 1634    Education provided  Yes    Education Details  ther-ex, HEP    Person(s) Educated  Patient    Methods  Explanation;Demonstration;Tactile cues;Verbal cues;Handout    Comprehension  Returned demonstration;Verbalized understanding        Objectives  Manual Therapy:    STM L UT. No change STM R UT to decrease tension. Improved abilty to raise L arm up with less pain   STM Bil cervical paraspinals R > L     Therapeutic  Exercise:  Supine chin tucks 10x5 seconds for 2 sets. Reviewed and given as part of her HEP. Pt demonstrated and verbalized understanding.   Seated manually resisted R scapular retraction targeting lower trap 10x5 seconds for 3 sets to help decrease R upper trap tension  Standing Bil shoulder ER with GTB 2x10 with cues for scapular squeeze   Standing scapular retraction with RTB 2x10 with demonstration and cues for proper form and technique   L shoulder abduction with R cervical rotation position 2x. Decreased pain with L shoulder abduction.   Improved exercise technique, movement at target joints, use of target muscles after min to mod verbal, visual, tactile cues.   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 Decreased L UE pain with abduction following STM to decrease R UT muscle tension and decreasing L cervical rotation position.             PT Long Term Goals - 04/15/17 1716      PT LONG TERM GOAL #1  Title  Patient will be independent with HEP to continue the benefits of therapy after discharge from therapy.    Baseline  Pt requires cueing with HEP    Time  6    Period  Weeks    Status  New    Target Date  05/27/17      PT LONG TERM GOAL #2   Title  Patient's worst pain will decrease from 10/10 to 5/10 in order for patient to perform household chores without increased pain.    Baseline  Pt's activity is currently limited secondary to increased L shoulder and UE pain.    Time  6    Period  Weeks    Status  New    Target Date  05/27/17      PT LONG TERM GOAL #3   Title  Patient's QuickDash score will decrease from 65% to 40% to indicate improvement in L shoulder function and pain so patient will be able to prepare food without increased pain.    Baseline  Pt has increased L shoulder pain with preparing food.    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 Decreased L UE pain with abduction following STM to decrease R UT muscle tension and decreasing L cervical rotation position.    Rehab Potential  Fair    Clinical Impairments Affecting Rehab Potential  + highly motivated ; - chronicity of pain    PT Frequency  2x / week    PT Duration  6 weeks    PT Treatment/Interventions  ADLs/Self Care Home Management;Cryotherapy;Electrical Stimulation;Iontophoresis 4mg /ml Dexamethasone;Moist Heat;Traction;Ultrasound;Parrafin;Fluidtherapy;Contrast Bath;Functional mobility training;Therapeutic activities;Therapeutic exercise;Neuromuscular re-education;Patient/family education;Manual techniques;Passive range of motion;Dry needling;Energy conservation;Splinting;Taping    PT Next Visit Plan  manual therapy, strength and endurance exercises    PT Home Exercise Plan  see education; UT stretch added on 2/6    Consulted and Agree with Plan of Care  Patient       Patient will benefit from skilled therapeutic intervention in order to improve the following deficits and impairments:  Decreased range of motion, Abnormal gait, Increased fascial restricitons, Improper body mechanics, Pain, Decreased coordination, Decreased mobility, Increased muscle spasms, Postural dysfunction, Decreased activity tolerance, Decreased endurance, Decreased strength, Hypomobility, Impaired UE functional use, Impaired flexibility  Visit Diagnosis: Chronic left shoulder pain  Muscle weakness (generalized)  Radiculopathy, cervical region     Problem List There are no active problems to display for this patient.   Loralyn Freshwater PT, DPT   04/22/2017, 5:04 PM  [place] [facility] PHYSICAL AND SPORTS MEDICINE [address] [place], [address] Phone: [phone]   Fax:  [phone]  Name: Carlyn Lemke MRN: [id number] Date of Birth: 1950-04-13

## 2017-04-22 NOTE — Patient Instructions (Signed)
Lying on your back, looking straight   Give yourself a double chin for 5 seconds.    Repeat 10 times   Perform 3 sets daily.

## 2017-04-24 ENCOUNTER — Ambulatory Visit: Payer: Medicare HMO

## 2017-04-30 ENCOUNTER — Ambulatory Visit: Payer: Medicare HMO

## 2017-04-30 DIAGNOSIS — G8929 Other chronic pain: Secondary | ICD-10-CM

## 2017-04-30 DIAGNOSIS — M6281 Muscle weakness (generalized): Secondary | ICD-10-CM

## 2017-04-30 DIAGNOSIS — M5412 Radiculopathy, cervical region: Secondary | ICD-10-CM

## 2017-04-30 DIAGNOSIS — M25512 Pain in left shoulder: Principal | ICD-10-CM

## 2017-04-30 DIAGNOSIS — L2084 Intrinsic (allergic) eczema: Secondary | ICD-10-CM | POA: Diagnosis not present

## 2017-04-30 NOTE — Therapy (Signed)
[facility] PHYSICAL AND SPORTS MEDICINE [address] [place], [place], [address] Phone: [phone]   Fax:  [phone]  Physical Therapy Treatment  Patient Details  Name: Erica Fairyatricia Mccorry MRN: [id number] Date of Birth: [birth year] Referring Provider: Armstead PeaksHeadrick, Emiliy FNP   Encounter Date: 04/30/2017  PT End of Session - 04/30/17 0944    Visit Number  4    Number of Visits  13    Date for PT Re-Evaluation  05/27/17    PT Start Time  0900    PT Stop Time  0945    PT Time Calculation (min)  45 min    Activity Tolerance  Patient tolerated treatment well    Behavior During Therapy  [facility] for tasks assessed/performed       Past Medical History:  Diagnosis Date  . Diabetes mellitus without complication (HCC)   . Hypertension     Past Surgical History:  Procedure Laterality Date  . ABDOMINAL HYSTERECTOMY    . EYE SURGERY      There were no vitals filed for this visit.  Subjective Assessment - 04/30/17 0950    Subjective  Pt states arm aches from L elbow down. . Pt reports doing HEP. Pt reports no shoulder pain currently.  Pt rates L arm pain can get up to 10/10.     Pertinent History  diabetes, hypertension, RA, Hepatitis    Limitations  Lifting    Patient Stated Goals  To decrease pain    Currently in Pain?  No/denies    Pain Onset  More than a month ago      Treatment  Manual therapy: with patient in sitting  STM utilizing a superficial technique to the L tricep, and bilateral cervical extensors and upper traps.  Treatment Therapeutic exercise Chin tucks - 1x15 to decrease cervical pain  Seated scapular retractions with RTB -- 2x20 to increase strength and endurance of the scapular retractors RTB pulldowns - 2x20 to increase strength and endurance of the upper traps Prayer stretch - x3 min to decrease shoulder and cervical pain   Pt demonstrates increased fatigue with exercise at end of session.     PT Education - 04/30/17  0944    Education provided  Yes    Education Details  Form/technique with exercise    Person(s) Educated  Patient    Methods  Explanation;Demonstration    Comprehension  Verbalized understanding;Returned demonstration          PT Long Term Goals - 04/15/17 1716      PT LONG TERM GOAL #1   Title  Patient will be independent with HEP to continue the benefits of therapy after discharge from therapy.    Baseline  Pt requires cueing with HEP    Time  6    Period  Weeks    Status  New    Target Date  05/27/17      PT LONG TERM GOAL #2   Title  Patient's worst pain will decrease from 10/10 to 5/10 in order for patient to perform household chores without increased pain.    Baseline  Pt's activity is currently limited secondary to increased L shoulder and UE pain.    Time  6    Period  Weeks    Status  New    Target Date  05/27/17      PT LONG TERM GOAL #3   Title  Patient's QuickDash score will decrease from 65% to 40% to indicate improvement  in L shoulder function and pain so patient will be able to prepare food without increased pain.    Baseline  Pt has increased L shoulder pain with preparing food.    Time  6    Period  Weeks    Status  New    Target Date  05/27/17      PT LONG TERM GOAL #4   Title  --    Baseline  --    Time  --    Period  --    Status  --    Target Date  --            Plan - 04/30/17 0946    Clinical Impression Statement  Pt demonstrates improvement with scapular retractions by performing more repititions wtih greater range of motion compared to previous session, indicating functional carryover between sessions. Although patient demonstrates improvement, pt continues to demonstrate deceased range and poor technique with cervical retractions, indicating poor motor control and spasm of the cervical musculature.  Pt will continue to benefit from further skilled therapy to return to prior levels of function.    Rehab Potential  Fair    Clinical  Impairments Affecting Rehab Potential  + highly motivated ; - chronicity of pain    PT Frequency  2x / week    PT Duration  6 weeks    PT Treatment/Interventions  ADLs/Self Care Home Management;Cryotherapy;Electrical Stimulation;Iontophoresis 4mg /ml Dexamethasone;Moist Heat;Traction;Ultrasound;Parrafin;Fluidtherapy;Contrast Bath;Functional mobility training;Therapeutic activities;Therapeutic exercise;Neuromuscular re-education;Patient/family education;Manual techniques;Passive range of motion;Dry needling;Energy conservation;Splinting;Taping    PT Next Visit Plan  manual therapy, strength and endurance exercises    PT Home Exercise Plan  see education; UT stretch added on 2/6    Consulted and Agree with Plan of Care  Patient       Patient will benefit from skilled therapeutic intervention in order to improve the following deficits and impairments:  Decreased range of motion, Abnormal gait, Increased fascial restricitons, Improper body mechanics, Pain, Decreased coordination, Decreased mobility, Increased muscle spasms, Postural dysfunction, Decreased activity tolerance, Decreased endurance, Decreased strength, Hypomobility, Impaired UE functional use, Impaired flexibility  Visit Diagnosis: Chronic left shoulder pain  Muscle weakness (generalized)  Radiculopathy, cervical region     Problem List There are no active problems to display for this patient.   Temple Pacini, SPT 04/30/2017, 10:45 AM  [facility] PHYSICAL AND SPORTS MEDICINE [address], [place], [address] Phone: [phone]   Fax:  [phone]  Name: Erica Estrada MRN: [id number] Date of Birth: 01-17-1951

## 2017-05-02 ENCOUNTER — Ambulatory Visit: Payer: Medicare HMO

## 2017-05-06 ENCOUNTER — Ambulatory Visit: Payer: Medicare HMO

## 2017-05-07 DIAGNOSIS — E119 Type 2 diabetes mellitus without complications: Secondary | ICD-10-CM | POA: Diagnosis not present

## 2017-05-07 DIAGNOSIS — M25512 Pain in left shoulder: Secondary | ICD-10-CM | POA: Diagnosis not present

## 2017-05-08 ENCOUNTER — Ambulatory Visit: Payer: Medicare HMO

## 2017-05-13 ENCOUNTER — Ambulatory Visit: Payer: Medicare HMO | Attending: Family Medicine

## 2017-05-16 ENCOUNTER — Ambulatory Visit: Payer: Medicare HMO

## 2017-09-21 ENCOUNTER — Encounter: Payer: Self-pay | Admitting: Emergency Medicine

## 2017-09-21 ENCOUNTER — Other Ambulatory Visit: Payer: Self-pay

## 2017-09-21 ENCOUNTER — Emergency Department
Admission: EM | Admit: 2017-09-21 | Discharge: 2017-09-21 | Disposition: A | Discharge status: Home / Self Care | Payer: Medicare Other | Attending: Emergency Medicine | Admitting: Emergency Medicine

## 2017-09-21 DIAGNOSIS — M542 Cervicalgia: Secondary | ICD-10-CM

## 2017-09-21 DIAGNOSIS — M5412 Radiculopathy, cervical region: Secondary | ICD-10-CM | POA: Diagnosis not present

## 2017-09-21 DIAGNOSIS — G8929 Other chronic pain: Secondary | ICD-10-CM | POA: Diagnosis not present

## 2017-09-21 DIAGNOSIS — E119 Type 2 diabetes mellitus without complications: Secondary | ICD-10-CM | POA: Insufficient documentation

## 2017-09-21 DIAGNOSIS — Z794 Long term (current) use of insulin: Secondary | ICD-10-CM | POA: Insufficient documentation

## 2017-09-21 DIAGNOSIS — Z79899 Other long term (current) drug therapy: Secondary | ICD-10-CM | POA: Insufficient documentation

## 2017-09-21 DIAGNOSIS — I10 Essential (primary) hypertension: Secondary | ICD-10-CM | POA: Diagnosis not present

## 2017-09-21 DIAGNOSIS — F17210 Nicotine dependence, cigarettes, uncomplicated: Secondary | ICD-10-CM | POA: Diagnosis not present

## 2017-09-21 LAB — COMPREHENSIVE METABOLIC PANEL
ALBUMIN: 3.7 g/dL (ref 3.5–5.0)
ALT: 8 U/L (ref 0–44)
AST: 22 U/L (ref 15–41)
Alkaline Phosphatase: 117 U/L (ref 38–126)
Anion gap: 9 (ref 5–15)
BUN: 10 mg/dL (ref 8–23)
CHLORIDE: 103 mmol/L (ref 98–111)
CO2: 28 mmol/L (ref 22–32)
CREATININE: 0.76 mg/dL (ref 0.44–1.00)
Calcium: 9.3 mg/dL (ref 8.9–10.3)
GFR calc Af Amer: >60 mL/min (ref >60)
GLUCOSE: 136 mg/dL — AB (ref 70–99)
Potassium: 3.8 mmol/L (ref 3.5–5.1)
Sodium: 140 mmol/L (ref 135–145)
Total Bilirubin: 0.3 mg/dL (ref 0.3–1.2)
Total Protein: 8 g/dL (ref 6.5–8.1)

## 2017-09-21 LAB — CBC WITH DIFFERENTIAL/PLATELET
BASOS ABS: 0.2 10*3/uL — AB (ref 0–0.1)
BASOS PCT: 1 %
EOS PCT: 1 %
Eosinophils Absolute: 0.1 10*3/uL (ref 0–0.7)
HEMATOCRIT: 37.5 % (ref 35.0–47.0)
Hemoglobin: 12.5 g/dL (ref 12.0–16.0)
LYMPHS PCT: 20 %
Lymphs Abs: 2.9 10*3/uL (ref 1.0–3.6)
MCH: 27.8 pg (ref 26.0–34.0)
MCHC: 33.3 g/dL (ref 32.0–36.0)
MCV: 83.4 fL (ref 80.0–100.0)
Monocytes Absolute: 0.6 10*3/uL (ref 0.2–0.9)
Monocytes Relative: 4 %
NEUTROS ABS: 10.6 10*3/uL — AB (ref 1.4–6.5)
Neutrophils Relative %: 74 %
PLATELETS: 290 10*3/uL (ref 150–440)
RBC: 4.5 MIL/uL (ref 3.80–5.20)
RDW: 14.8 % — ABNORMAL HIGH (ref 11.5–14.5)
WBC: 14.5 10*3/uL — ABNORMAL HIGH (ref 3.6–11.0)

## 2017-09-21 LAB — TROPONIN I

## 2017-09-21 MED ORDER — METHOCARBAMOL 500 MG PO TABS: 500.0000 mg | ORAL_TABLET | Freq: Four times a day (QID) | ORAL | 0 refills | Status: DC

## 2017-09-21 MED ORDER — OXYCODONE-ACETAMINOPHEN 5-325 MG PO TABS
1.0000 | ORAL_TABLET | Freq: Once | ORAL | Status: AC
  Administered 2017-09-21: 1 via ORAL
  Filled 2017-09-21: qty 1

## 2017-09-21 MED ORDER — HYDROCODONE-ACETAMINOPHEN 5-325 MG PO TABS: 1.0000 | ORAL_TABLET | Freq: Four times a day (QID) | ORAL | 0 refills | Status: DC | PRN

## 2017-09-21 NOTE — ED Provider Notes (Signed)
[facility] Emergency Department Provider Note  ____________________________________________   First MD Initiated Contact with Patient 09/21/17 1211     (approximate)  I have reviewed the triage vital signs and the nursing notes.   HISTORY  Chief Complaint Neck Pain  HPI Erica Estrada is a 67 y.o. female is here with complaint of left-sided neck pain that radiates from her neck to her upper back and down her left arm.  Patient states that she had symptoms since Monday.  She states she saw her PCP yesterday and was told to increase her gabapentin.  She denies any nausea, vomiting, shortness of breath, diaphoresis or chest pain.  Patient does have hypertension and hyperlipidemia.  Currently she rates her pain as an 8/10.   Past Medical History:  Diagnosis Date  . Diabetes mellitus without complication (HCC)   . Hypertension     There are no active problems to display for this patient.   Past Surgical History:  Procedure Laterality Date  . ABDOMINAL HYSTERECTOMY    . EYE SURGERY      Prior to Admission medications   Medication Sig Start Date End Date Taking? Authorizing Provider  amLODipine (NORVASC) 10 MG tablet Take 1 tablet by mouth daily. 03/26/17   [provider]  atorvastatin (LIPITOR) 80 MG tablet Take 1 tablet by mouth daily. 03/26/17   [provider]  betamethasone dipropionate (DIPROLENE) 0.05 % cream Apply 1 application topically daily as needed. 04/01/17   [provider]  buPROPion (ZYBAN) 150 MG 12 hr tablet Take 150 mg by mouth 2 (two) times daily.    [provider]  clotrimazole-betamethasone (LOTRISONE) lotion Apply topically 2 (two) times daily. 03/22/17   Fisher, Roselyn BeringSusan W, PA-C  HYDROcodone-acetaminophen (NORCO/VICODIN) 5-325 MG tablet Take 1 tablet by mouth every 6 (six) hours as needed for moderate pain. 09/21/17   Tommi RumpsSummers, Rhonda L, PA-C  insulin lispro (HUMALOG KWIKPEN) 100 UNIT/ML KiwkPen  Inject 20 Units into the skin 3 (three) times daily. Plus sliding scale for up to 100 units TDD 04/18/16 04/18/17  [provider]  JARDIANCE 10 MG TABS tablet Take 1 tablet by mouth daily. 03/26/17   [provider]  losartan-hydrochlorothiazide (HYZAAR) 100-25 MG tablet Take 1 tablet by mouth daily.    [provider]  metformin (FORTAMET) 1000 MG (OSM) 24 hr tablet Take 1 tablet by mouth 2 (two) times daily with a meal.  03/21/17   [provider]  methocarbamol (ROBAXIN) 500 MG tablet Take 1 tablet (500 mg total) by mouth 4 (four) times daily. 09/21/17   Tommi RumpsSummers, Rhonda L, PA-C  naproxen (NAPROSYN) 500 MG tablet Take 1 tablet by mouth 2 (two) times daily. 03/26/17   [provider]  NOVOLOG FLEXPEN 100 UNIT/ML FlexPen Inject 20 Units into the skin 3 (three) times daily with meals. Plus sliding scale for up to 100 units TDD 03/22/17   [provider]  omeprazole (PRILOSEC) 40 MG capsule Take 1 capsule by mouth daily. 04/08/17   [provider]  ondansetron (ZOFRAN ODT) 4 MG disintegrating tablet Take 1 tablet (4 mg total) by mouth every 8 (eight) hours as needed for nausea or vomiting. 04/13/17   Merrily Brittleifenbark, Neil, MD  PARoxetine (PAXIL) 40 MG tablet Take 40 mg by mouth every morning.    [provider]  SPIRIVA HANDIHALER 18 MCG inhalation capsule Place 1 capsule into inhaler and inhale daily. 03/22/17   [provider]  TRESIBA FLEXTOUCH 200 UNIT/ML SOPN Inject 60  Units into the skin daily. 04/02/17   [provider]  VENTOLIN HFA 108 (90 Base) MCG/ACT inhaler Inhale 2 puffs into the lungs every 6 (six) hours as needed. 03/26/17   [provider]    Allergies Patient has no known allergies.  No family history on file.  Social History Social History   Tobacco Use  . Smoking status: Current Every Day Smoker    Packs/day: 0.50  . Smokeless tobacco: Never Used  Substance Use Topics  . Alcohol use: No     Frequency: Never  . Drug use: No    Review of Systems Constitutional: No fever/chills Cardiovascular: Denies chest pain. Respiratory: Denies shortness of breath. Gastrointestinal:  No nausea, no vomiting.   Musculoskeletal: Positive left upper back pain with radiation into the left arm. Skin: Negative for rash. Neurological: Negative for headaches, focal weakness or numbness. ____________________________________________   PHYSICAL EXAM:  VITAL SIGNS: ED Triage Vitals  Enc Vitals Group     BP 09/21/17 1127 (!) 141/89     Pulse Rate 09/21/17 1127 77     Resp 09/21/17 1127 15     Temp 09/21/17 1127 98.3 F (36.8 C)     Temp Source 09/21/17 1127 Oral     SpO2 09/21/17 1127 99 %     Weight 09/21/17 1123 177 lb (80.3 kg)     Height 09/21/17 1122 5\' 5"  (1.651 m)     Head Circumference --      Peak Flow --      Pain Score 09/21/17 1122 8     Pain Loc --      Pain Edu? --      Excl. in GC? --    Constitutional: Alert and oriented. Well appearing and in no acute distress. Eyes: Conjunctivae are normal.  Head: Atraumatic. Neck: No stridor.  Mild diffuse tenderness on palpation of the cervical spine.  Range of motion is restricted secondary to discomfort but patient is able to move in all 4 planes.  There is tenderness on palpation of the left trapezius muscle.  No ecchymosis or abrasions are seen.  And is intact. Cardiovascular: Normal rate, regular rhythm. Grossly normal heart sounds.  Good peripheral circulation. Respiratory: Normal respiratory effort.  No retractions. Lungs CTAB. Musculoskeletal: On examination of left shoulder there is no gross deformity and no soft tissue swelling.  Patient is able to move in all 4 planes without any difficulties.  No crepitus is appreciated.  Skin is intact.  Pulse is present and motor sensory function intact. Neurologic:  Normal speech and language. No gross focal neurologic deficits are appreciated.  Skin:  Skin is warm, dry and intact. No  rash noted. Psychiatric: Mood and affect are normal. Speech and behavior are normal.  ____________________________________________   LABS (all labs ordered are listed, but only abnormal results are displayed)  Labs Reviewed  CBC WITH DIFFERENTIAL/PLATELET - Abnormal; Notable for the following components:      Result Value   WBC 14.5 (*)    RDW 14.8 (*)    Neutro Abs 10.6 (*)    Basophils Absolute 0.2 (*)    All other components within normal limits  COMPREHENSIVE METABOLIC PANEL - Abnormal; Notable for the following components:   Glucose, Bld 136 (*)    All other components within normal limits  TROPONIN I   ____________________________________________  EKG  Was read by Dr. Don Perking. Normal sinus rhythm with a ventricular rate of 73, PR interval 148, QRS duration 92 ____________________________________________  PROCEDURES  Procedure(s) performed: None  Procedures  Critical Care performed: No  ____________________________________________   INITIAL IMPRESSION / ASSESSMENT AND PLAN / ED COURSE  As part of my medical decision making, I reviewed the following data within the electronic MEDICAL RECORD NUMBER Notes from prior ED visits and [place] Controlled Substance Database  Patient has acute exacerbation of her chronic neck pain.  Patient is to follow-up with her PCP if any continued problems.  She was discharged with a prescription for the carbinol 500 mg 4 times daily and Norco 1 every 6 hours as needed for moderate pain.  She is to continue with her regular medications.  She is reassured that her lab studies did not indicate any problems with her heart. ____________________________________________   FINAL CLINICAL IMPRESSION(S) / ED DIAGNOSES  Final diagnoses:  Acute cervical radiculopathy  Chronic neck pain     ED Discharge Orders        Ordered    HYDROcodone-acetaminophen (NORCO/VICODIN) 5-325 MG tablet  Every 6 hours PRN     09/21/17 1423    methocarbamol  (ROBAXIN) 500 MG tablet  4 times daily     09/21/17 1423       Note:  This document was prepared using Dragon voice recognition software and may include unintentional dictation errors.    Tommi Rumps, PA-C 09/21/17 1459    Minna Antis, MD 09/21/17 1505

## 2017-09-21 NOTE — ED Notes (Signed)
See triage note  States she has had neck pain and left upper back pain   Has been seen by PCP  But states pain is not any better

## 2017-09-21 NOTE — ED Triage Notes (Signed)
First Nurse Note:  C/O left neck pain that radiates to upper back and left arm.  Onset of symptoms Monday 7/8.  Seen by PCP yesterday for same and was told to increase gabapentin.

## 2017-09-21 NOTE — Discharge Instructions (Addendum)
Increase her gabapentin as directed by your doctor.  You may use ice or heat to your neck as needed for discomfort.  Also begin taking hydrocodone 1 every 6 hours as needed for pain and Robaxin 500 mg 4 times daily as needed for muscle pain.  You should plan on following up with your primary care provider next week if any continued problems.

## 2017-09-21 NOTE — ED Provider Notes (Signed)
ED ECG REPORT I, Nita Sicklearolina Bright Spielmann, the attending physician, personally viewed and interpreted this ECG.  NSR, rate 73, normal intervals, normal axis, STE on V2, no ST depression. Unchanged from prior   [place], [place], MD 09/21/17 1137

## 2017-09-30 ENCOUNTER — Other Ambulatory Visit: Payer: Self-pay | Admitting: Emergency Medicine

## 2017-11-09 ENCOUNTER — Emergency Department: Payer: Medicare Other

## 2017-11-09 ENCOUNTER — Other Ambulatory Visit: Payer: Self-pay

## 2017-11-09 ENCOUNTER — Emergency Department
Admission: EM | Admit: 2017-11-09 | Discharge: 2017-11-10 | Disposition: A | Discharge status: Other Acute Inpatient Hospital | Payer: Medicare Other | Attending: Emergency Medicine | Admitting: Emergency Medicine

## 2017-11-09 DIAGNOSIS — Z79899 Other long term (current) drug therapy: Secondary | ICD-10-CM | POA: Diagnosis not present

## 2017-11-09 DIAGNOSIS — Z794 Long term (current) use of insulin: Secondary | ICD-10-CM | POA: Diagnosis not present

## 2017-11-09 DIAGNOSIS — I10 Essential (primary) hypertension: Secondary | ICD-10-CM | POA: Diagnosis not present

## 2017-11-09 DIAGNOSIS — F331 Major depressive disorder, recurrent, moderate: Secondary | ICD-10-CM

## 2017-11-09 DIAGNOSIS — E119 Type 2 diabetes mellitus without complications: Secondary | ICD-10-CM | POA: Diagnosis not present

## 2017-11-09 DIAGNOSIS — F17200 Nicotine dependence, unspecified, uncomplicated: Secondary | ICD-10-CM | POA: Insufficient documentation

## 2017-11-09 DIAGNOSIS — F329 Major depressive disorder, single episode, unspecified: Secondary | ICD-10-CM | POA: Diagnosis present

## 2017-11-09 LAB — COMPREHENSIVE METABOLIC PANEL
ALBUMIN: 4 g/dL (ref 3.5–5.0)
ALT: 9 U/L (ref 0–44)
AST: 26 U/L (ref 15–41)
Alkaline Phosphatase: 112 U/L (ref 38–126)
Anion gap: 8 (ref 5–15)
BUN: 22 mg/dL (ref 8–23)
CHLORIDE: 102 mmol/L (ref 98–111)
CO2: 26 mmol/L (ref 22–32)
Calcium: 9.4 mg/dL (ref 8.9–10.3)
Creatinine, Ser: 0.84 mg/dL (ref 0.44–1.00)
GFR calc Af Amer: >60 mL/min (ref >60)
GFR calc non Af Amer: >60 mL/min (ref >60)
Glucose, Bld: 149 mg/dL — ABNORMAL HIGH (ref 70–99)
POTASSIUM: 3.3 mmol/L — AB (ref 3.5–5.1)
Sodium: 136 mmol/L (ref 135–145)
Total Bilirubin: 0.3 mg/dL (ref 0.3–1.2)
Total Protein: 8.1 g/dL (ref 6.5–8.1)

## 2017-11-09 LAB — URINALYSIS, COMPLETE (UACMP) WITH MICROSCOPIC
Bilirubin Urine: NEGATIVE
Glucose, UA: >=500 mg/dL — AB
HGB URINE DIPSTICK: NEGATIVE
Ketones, ur: NEGATIVE mg/dL
Leukocytes, UA: NEGATIVE
Nitrite: NEGATIVE
PROTEIN: NEGATIVE mg/dL
Specific Gravity, Urine: 1.018 (ref 1.005–1.030)
pH: 6 (ref 5.0–8.0)

## 2017-11-09 LAB — SALICYLATE LEVEL: Salicylate Lvl: <7 mg/dL (ref 2.8–30.0)

## 2017-11-09 LAB — ACETAMINOPHEN LEVEL: Acetaminophen (Tylenol), Serum: <10 ug/mL — ABNORMAL LOW (ref 10–30)

## 2017-11-09 LAB — URINE DRUG SCREEN, QUALITATIVE (ARMC ONLY)
AMPHETAMINES, UR SCREEN: NOT DETECTED
Barbiturates, Ur Screen: NOT DETECTED
Cannabinoid 50 Ng, Ur ~~LOC~~: POSITIVE — AB
Cocaine Metabolite,Ur ~~LOC~~: NOT DETECTED
MDMA (ECSTASY) UR SCREEN: NOT DETECTED
Methadone Scn, Ur: NOT DETECTED
OPIATE, UR SCREEN: NOT DETECTED
PHENCYCLIDINE (PCP) UR S: NOT DETECTED
Tricyclic, Ur Screen: NOT DETECTED

## 2017-11-09 LAB — ETHANOL: Alcohol, Ethyl (B): <10 mg/dL (ref <10)

## 2017-11-09 LAB — GLUCOSE, CAPILLARY
GLUCOSE-CAPILLARY: 199 mg/dL — AB (ref 70–99)
GLUCOSE-CAPILLARY: 121 mg/dL — AB (ref 70–99)
GLUCOSE-CAPILLARY: 110 mg/dL — AB (ref 70–99)
Glucose-Capillary: 167 mg/dL — ABNORMAL HIGH (ref 70–99)

## 2017-11-09 LAB — CBC
HEMATOCRIT: 38.3 % (ref 35.0–47.0)
HEMOGLOBIN: 12.5 g/dL (ref 12.0–16.0)
MCH: 27.6 pg (ref 26.0–34.0)
MCHC: 32.6 g/dL (ref 32.0–36.0)
MCV: 84.7 fL (ref 80.0–100.0)
PLATELETS: 292 10*3/uL (ref 150–440)
RBC: 4.52 MIL/uL (ref 3.80–5.20)
RDW: 15.6 % — ABNORMAL HIGH (ref 11.5–14.5)
WBC: 17 10*3/uL — AB (ref 3.6–11.0)

## 2017-11-09 MED ORDER — HYDROCHLOROTHIAZIDE 25 MG PO TABS
25.0000 mg | ORAL_TABLET | Freq: Every day | ORAL | Status: DC
  Administered 2017-11-09 – 2017-11-10 (×2): 25 mg via ORAL
  Filled 2017-11-09 (×2): qty 1

## 2017-11-09 MED ORDER — BUPROPION HCL ER (SR) 150 MG PO TB12
150.0000 mg | ORAL_TABLET | Freq: Two times a day (BID) | ORAL | Status: DC
  Administered 2017-11-09 – 2017-11-10 (×3): 150 mg via ORAL
  Filled 2017-11-09 (×5): qty 1

## 2017-11-09 MED ORDER — ALBUTEROL SULFATE (2.5 MG/3ML) 0.083% IN NEBU
3.0000 mL | INHALATION_SOLUTION | Freq: Four times a day (QID) | RESPIRATORY_TRACT | Status: DC | PRN
Start: 2017-11-09 — End: 2017-11-10
  Filled 2017-11-09: qty 3

## 2017-11-09 MED ORDER — INSULIN ASPART 100 UNIT/ML ~~LOC~~ SOLN
0.0000 [IU] | Freq: Three times a day (TID) | SUBCUTANEOUS | Status: DC
  Administered 2017-11-09: 3 [IU] via SUBCUTANEOUS
  Administered 2017-11-09 – 2017-11-10 (×2): 2 [IU] via SUBCUTANEOUS
  Filled 2017-11-09 (×4): qty 1

## 2017-11-09 MED ORDER — LOSARTAN POTASSIUM 50 MG PO TABS
100.0000 mg | ORAL_TABLET | Freq: Every day | ORAL | Status: DC
  Administered 2017-11-09 – 2017-11-10 (×2): 100 mg via ORAL
  Filled 2017-11-09 (×2): qty 2

## 2017-11-09 MED ORDER — TIOTROPIUM BROMIDE MONOHYDRATE 18 MCG IN CAPS
1.0000 | ORAL_CAPSULE | Freq: Every day | RESPIRATORY_TRACT | Status: DC
  Administered 2017-11-09: 18 ug via RESPIRATORY_TRACT
  Filled 2017-11-09 (×2): qty 5

## 2017-11-09 MED ORDER — METFORMIN HCL ER 500 MG PO TB24
1000.0000 mg | ORAL_TABLET | Freq: Two times a day (BID) | ORAL | Status: DC
  Administered 2017-11-09 – 2017-11-10 (×2): 1000 mg via ORAL
  Filled 2017-11-09 (×4): qty 2

## 2017-11-09 MED ORDER — PAROXETINE HCL 20 MG PO TABS
40.0000 mg | ORAL_TABLET | ORAL | Status: DC
  Administered 2017-11-09 – 2017-11-10 (×2): 40 mg via ORAL
  Filled 2017-11-09 (×3): qty 2

## 2017-11-09 MED ORDER — LOSARTAN POTASSIUM-HCTZ 100-25 MG PO TABS: 1.0000 | ORAL_TABLET | Freq: Every day | ORAL | Status: DC

## 2017-11-09 MED ORDER — PANTOPRAZOLE SODIUM 40 MG PO TBEC
40.0000 mg | DELAYED_RELEASE_TABLET | Freq: Every day | ORAL | Status: DC
  Administered 2017-11-09 – 2017-11-10 (×2): 40 mg via ORAL
  Filled 2017-11-09 (×2): qty 1

## 2017-11-09 MED ORDER — INSULIN ASPART 100 UNIT/ML FLEXPEN: 20.0000 [IU] | PEN_INJECTOR | Freq: Three times a day (TID) | SUBCUTANEOUS | Status: DC

## 2017-11-09 MED ORDER — INSULIN LISPRO 100 UNIT/ML (KWIKPEN): 20.0000 [IU] | PEN_INJECTOR | Freq: Three times a day (TID) | SUBCUTANEOUS | Status: DC

## 2017-11-09 MED ORDER — ATORVASTATIN CALCIUM 20 MG PO TABS
80.0000 mg | ORAL_TABLET | Freq: Every day | ORAL | Status: DC
  Administered 2017-11-09 – 2017-11-10 (×2): 80 mg via ORAL
  Filled 2017-11-09 (×2): qty 8

## 2017-11-09 MED ORDER — AMLODIPINE BESYLATE 5 MG PO TABS
10.0000 mg | ORAL_TABLET | Freq: Every day | ORAL | Status: DC
  Administered 2017-11-09 – 2017-11-10 (×2): 10 mg via ORAL
  Filled 2017-11-09 (×2): qty 2

## 2017-11-09 MED ORDER — LORAZEPAM 2 MG/ML IJ SOLN: 2.0000 mg | Freq: Three times a day (TID) | INTRAMUSCULAR | Status: DC | PRN

## 2017-11-09 MED ORDER — METFORMIN HCL 500 MG PO TABS
ORAL_TABLET | ORAL | Status: AC
  Administered 2017-11-09: 1000 mg
  Filled 2017-11-09: qty 2

## 2017-11-09 NOTE — ED Notes (Signed)
Hourly rounding reveals patient in room. No complaints, stable, in no acute distress. Q15 minute rounds and monitoring via Security Cameras to continue. 

## 2017-11-09 NOTE — ED Notes (Signed)
One red printed fleece jacket, one green t shirt, one pair of green and white pj pants, one pair of black sandals, one purple scarf and one yellow metal ring with black stone placed in labeled belonging bag. One moto cell phone with black wired earbuds and pink case placed in labeled one of one belonging bag with clothing.

## 2017-11-09 NOTE — ED Notes (Signed)
Report to include Situation, Background, Assessment, and Recommendations received from RN Amy. Patient alert and oriented, warm and dry, in no acute distress. Patient denies SI, HI, AVH and pain. Patient made aware of Q15 minute rounds and security cameras for their safety. Patient instructed to come to me with needs or concerns.  

## 2017-11-09 NOTE — ED Provider Notes (Signed)
[facility] Emergency Department Provider Note   ____________________________________________   First MD Initiated Contact with Patient 11/09/17 0222     (approximate)  I have reviewed the triage vital signs and the nursing notes.   HISTORY  Chief Complaint Psychiatric Evaluation    HPI Erica Estrada is a 67 y.o. female brought to the ED under IVC for depression.  Patient has a history of depression, taking antidepressants, who endorses vague suicidal ideation without plan.  Her IVC papers that she is off her medications.  Voices no medical complaints.  Specifically, denies recent fever, chills, chest pain, shortness of breath, abdominal pain, nausea, vomiting or dysuria.  Denies recent travel or trauma.   Past Medical History:  Diagnosis Date  . Diabetes mellitus without complication (HCC)   . Hypertension     There are no active problems to display for this patient.   Past Surgical History:  Procedure Laterality Date  . ABDOMINAL HYSTERECTOMY    . EYE SURGERY      Prior to Admission medications   Medication Sig Start Date End Date Taking? Authorizing Provider  amLODipine (NORVASC) 10 MG tablet Take 1 tablet by mouth daily. 03/26/17   [provider]  atorvastatin (LIPITOR) 80 MG tablet Take 1 tablet by mouth daily. 03/26/17   [provider]  betamethasone dipropionate (DIPROLENE) 0.05 % cream Apply 1 application topically daily as needed. 04/01/17   [provider]  buPROPion (ZYBAN) 150 MG 12 hr tablet Take 150 mg by mouth 2 (two) times daily.    [provider]  clotrimazole-betamethasone (LOTRISONE) lotion Apply topically 2 (two) times daily. 03/22/17   Fisher, Roselyn Bering, PA-C  HYDROcodone-acetaminophen (NORCO/VICODIN) 5-325 MG tablet Take 1 tablet by mouth every 6 (six) hours as needed for moderate pain. 09/21/17   Tommi Rumps, PA-C  insulin lispro (HUMALOG KWIKPEN) 100 UNIT/ML KiwkPen Inject 20 Units  into the skin 3 (three) times daily. Plus sliding scale for up to 100 units TDD 04/18/16 04/18/17  [provider]  JARDIANCE 10 MG TABS tablet Take 1 tablet by mouth daily. 03/26/17   [provider]  losartan-hydrochlorothiazide (HYZAAR) 100-25 MG tablet Take 1 tablet by mouth daily.    [provider]  metformin (FORTAMET) 1000 MG (OSM) 24 hr tablet Take 1 tablet by mouth 2 (two) times daily with a meal.  03/21/17   [provider]  methocarbamol (ROBAXIN) 500 MG tablet Take 1 tablet (500 mg total) by mouth 4 (four) times daily. 09/21/17   Tommi Rumps, PA-C  naproxen (NAPROSYN) 500 MG tablet Take 1 tablet by mouth 2 (two) times daily. 03/26/17   [provider]  NOVOLOG FLEXPEN 100 UNIT/ML FlexPen Inject 20 Units into the skin 3 (three) times daily with meals. Plus sliding scale for up to 100 units TDD 03/22/17   [provider]  omeprazole (PRILOSEC) 40 MG capsule Take 1 capsule by mouth daily. 04/08/17   [provider]  ondansetron (ZOFRAN ODT) 4 MG disintegrating tablet Take 1 tablet (4 mg total) by mouth every 8 (eight) hours as needed for nausea or vomiting. 04/13/17   Merrily Brittle, MD  PARoxetine (PAXIL) 40 MG tablet Take 40 mg by mouth every morning.    [provider]  SPIRIVA HANDIHALER 18 MCG inhalation capsule Place 1 capsule into inhaler and inhale daily. 03/22/17   [provider]  TRESIBA FLEXTOUCH 200 UNIT/ML SOPN Inject 60 Units into the skin daily. 04/02/17   [provider]  VENTOLIN HFA 108 (90 Base) MCG/ACT inhaler Inhale 2 puffs into the lungs every 6 (six) hours as needed. 03/26/17   [provider]    Allergies Ace inhibitors and Fluticasone  No family history on file.  Social History Social History   Tobacco Use  . Smoking status: Current Every Day Smoker    Packs/day: 0.50  . Smokeless tobacco: Never Used  Substance Use Topics  . Alcohol use: No    Frequency: Never    . Drug use: No    Review of Systems  Constitutional: No fever/chills Eyes: No visual changes. ENT: No sore throat. Cardiovascular: Denies chest pain. Respiratory: Denies shortness of breath. Gastrointestinal: No abdominal pain.  No nausea, no vomiting.  No diarrhea.  No constipation. Genitourinary: Negative for dysuria. Musculoskeletal: Negative for back pain. Skin: Negative for rash. Neurological: Negative for headaches, focal weakness or numbness. Psychiatric: Positive for depression with suicidal thoughts.  ____________________________________________   PHYSICAL EXAM:  VITAL SIGNS: ED Triage Vitals [11/09/17 0136]  Enc Vitals Group     BP (!) 164/91     Pulse Rate (!) 108     Resp (!) 22     Temp 98.8 F (37.1 C)     Temp Source Oral     SpO2 100 %     Weight 172 lb (78 kg)     Height 5\' 5"  (1.651 m)     Head Circumference      Peak Flow      Pain Score 0     Pain Loc      Pain Edu?      Excl. in GC?     Constitutional: Alert and oriented. Well appearing and in mild acute distress. Eyes: Conjunctivae are normal. PERRL. EOMI. Left eye with sequela of eye surgery. Head: Atraumatic. Nose: No congestion/rhinnorhea. Mouth/Throat: Mucous membranes are moist.  Oropharynx non-erythematous. Neck: No stridor.   Cardiovascular: Normal rate, regular rhythm. Grossly normal heart sounds.  Good peripheral circulation. Respiratory: Normal respiratory effort.  No retractions. Lungs CTAB. Gastrointestinal: Soft and nontender. No distention. No abdominal bruits. No CVA tenderness. Musculoskeletal: No lower extremity tenderness nor edema.  No joint effusions. Neurologic:  Normal speech and language. No gross focal neurologic deficits are appreciated. No gait instability. Skin:  Skin is warm, dry and intact. No rash noted. Psychiatric: Mood and affect are tearful. Speech and behavior are normal.  ____________________________________________   LABS (all labs ordered are  listed, but only abnormal results are displayed)  Labs Reviewed  COMPREHENSIVE METABOLIC PANEL - Abnormal; Notable for the following components:      Result Value   Potassium 3.3 (*)    Glucose, Bld 149 (*)    All other components within normal limits  ACETAMINOPHEN LEVEL - Abnormal; Notable for the following components:   Acetaminophen (Tylenol), Serum <10 (*)    All other components within normal limits  CBC - Abnormal; Notable for the following components:   WBC 17.0 (*)    RDW 15.6 (*)    All other components within normal limits  URINE DRUG SCREEN, QUALITATIVE ([facility] ONLY) - Abnormal; Notable for the following components:   Cannabinoid 50 Ng, Ur [place] POSITIVE (*)    Benzodiazepine, Ur Scrn TEST NOT PERFORMED, REAGENT NOT AVAILABLE (*)    All other components within normal limits  URINALYSIS, COMPLETE (UACMP) WITH MICROSCOPIC - Abnormal; Notable for the following components:   Color, Urine STRAW (*)    APPearance CLEAR (*)    Glucose,  UA >=500 (*)    Bacteria, UA RARE (*)    All other components within normal limits  ETHANOL  SALICYLATE LEVEL   ____________________________________________  EKG  None ____________________________________________  RADIOLOGY  ED MD interpretation: None  Official radiology report(s): Dg Chest Port 1 View  Result Date: 11/09/2017 CLINICAL DATA:  67 year old female with chest pain and shortness of breath. EXAM: PORTABLE CHEST 1 VIEW COMPARISON:  None. FINDINGS: There is no focal consolidation, pleural effusion, or pneumothorax. The cardiac silhouette is within normal limits. Atherosclerotic calcification of the aortic arch. No acute osseous pathology. IMPRESSION: No active disease. Electronically Signed   By: Elgie CollardArash  Radparvar M.D.   On: 11/09/2017 03:32    ____________________________________________   PROCEDURES  Procedure(s) performed: None  Procedures  Critical Care performed:  No  ____________________________________________   INITIAL IMPRESSION / ASSESSMENT AND PLAN / ED COURSE  As part of my medical decision making, I reviewed the following data within the electronic MEDICAL RECORD NUMBER Nursing notes reviewed and incorporated, Labs reviewed, Old chart reviewed, A consult was requested and obtained from this/these consultant(s) Psychiatry and Notes from prior ED visits   67 year old female who presents under IVC for depression with suicidal ideation without plan.  Laboratory and urine results remarkable for leukocytosis, positive cannabinoids.  Patient denies fever, cough or dysuria.  Will check chest x-ray for thorough work-up.  Will maintain [facility].  Clinical Course as of Nov 10 734  Sat Nov 09, 2017  0307 Medications are not reconciled.  There are multiple insulins listed for the patient in the computer.  I am unsure which ones she is supposed be taking.  Will cover her with sliding scale insulin until pharmacy tech can reconcile her medications.   [JS]  0609 [facility] Dr[facility] Orpah Clintonollin who recommends admission to inpatient psychiatry service.  Recommends continuing Paxil and Zyban.  Recommends as needed Ativan.   [JS]    Clinical Course User Index [JS] Irean HongSung, Niquita Digioia J, MD     ____________________________________________   FINAL CLINICAL IMPRESSION(S) / ED DIAGNOSES  Final diagnoses:  Moderate episode of recurrent major depressive disorder [facility](HCC)     ED Discharge Orders    None       Note:  This document was prepared using Dragon voice recognition software and may include unintentional dictation errors.    Irean HongSung, Eisa Conaway J, MD 11/09/17 [phone]

## 2017-11-09 NOTE — ED Notes (Signed)
Patient is alert and oriented to person, place and time.  Presents with sad flat, affect and depressed mood.  Denies suicidal thoughts, auditory and visual hallucinations.  Medications given as prescribed.  No signs/ symptoms of hypoglycemia noted.  Offered support and encouragement as needed.  Routine safety checks  Maintained every 15 minutes.  Patient is safe on the unit.

## 2017-11-09 NOTE — ED Notes (Signed)
Pt reports increased stress over family issues and finances over the last few weeks. Pt reports she has been on antidepressants for years. Pt reports hx of one prior in patient hospitalization in the 1990's. Pt states she wished it would all just end. Pt will not contract for safety at this time with this RN. Charge RN informed and will place sitter at bedside.

## 2017-11-09 NOTE — ED Notes (Signed)
Patient BG 167 at 2019. No coverage given per MD order.

## 2017-11-09 NOTE — ED Notes (Signed)
IVC 
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
## 2017-11-09 NOTE — ED Notes (Signed)
Pt. Introduced to unit.  Pt. Advised of security cameras and 15 min. Safety checks.  Pt. Requested and was given additional blanket, pillow and cup of apple juice.  Pt. Calm and cooperative.

## 2017-11-09 NOTE — ED Notes (Signed)
Report given to Dr Orpah Clintonollin, [facility]. Camera in the room and pt understanding of process.

## 2017-11-09 NOTE — BH Assessment (Signed)
Per AC-Shaleta, BMU is holding at 13 patients due to a shortage in staffing tomorrow. AC stated she will review patient for possible admission to [facility] tomorrow pending transport. Informed AC, per ED Secretary Luann, there is no transportation today.  

## 2017-11-09 NOTE — ED Notes (Signed)
Pt to room 20. Bagged, labeled clothing handed to ann, Melina Coparn. Sarah ed tech to bedside for one to one.

## 2017-11-09 NOTE — ED Triage Notes (Signed)
Pt here with BPD with IVC papers for depression. Pt states she does not actively feel suicidal but then states "if I wasn't here it would be alright". Pt very tearful in triage.

## 2017-11-09 NOTE — BH Assessment (Signed)
[facility].  Patient assigned to Room 405-Bed 1 [facility].  Call report to [phone].  Representative was AC-Shaleta.   ER Staff is aware of it:  Ronnie: ER Secretary  Dr. Langston MaskerShaevitz: ER MD  Amy: Patient's Nurse     Patient's Family/Support System [facility](Selvie Durwin NoraWinston [phone] (daughter) left HIPPA message) have been updated as well.  Address: [address], [place], [place] [id number]

## 2017-11-09 NOTE — ED Notes (Signed)
Snack and beverage given. 

## 2017-11-09 NOTE — BH Assessment (Signed)
[facility]  [facility]eta [facility] and she stated patients white blood count is low which indicates an infection and she needs to be given her home meds for blood pressure due to it being elevated.   Writer informed [facility] nurse and she stated she will informed EDP Dr. Darnelle CatalanMalinda.

## 2017-11-10 ENCOUNTER — Encounter (HOSPITAL_COMMUNITY): Payer: Self-pay | Admitting: Behavioral Health

## 2017-11-10 ENCOUNTER — Inpatient Hospital Stay (HOSPITAL_COMMUNITY)
Admission: AD | Admit: 2017-11-10 | Discharge: 2017-11-13 | DRG: 885 | Disposition: A | Discharge status: Home / Self Care | Payer: Medicare Other | Attending: Psychiatry | Admitting: Psychiatry

## 2017-11-10 ENCOUNTER — Other Ambulatory Visit: Payer: Self-pay

## 2017-11-10 DIAGNOSIS — K219 Gastro-esophageal reflux disease without esophagitis: Secondary | ICD-10-CM | POA: Diagnosis present

## 2017-11-10 DIAGNOSIS — R45851 Suicidal ideations: Secondary | ICD-10-CM | POA: Diagnosis present

## 2017-11-10 DIAGNOSIS — F419 Anxiety disorder, unspecified: Secondary | ICD-10-CM | POA: Diagnosis not present

## 2017-11-10 DIAGNOSIS — F4310 Post-traumatic stress disorder, unspecified: Secondary | ICD-10-CM

## 2017-11-10 DIAGNOSIS — Z62810 Personal history of physical and sexual abuse in childhood: Secondary | ICD-10-CM | POA: Diagnosis present

## 2017-11-10 DIAGNOSIS — F331 Major depressive disorder, recurrent, moderate: Secondary | ICD-10-CM | POA: Diagnosis not present

## 2017-11-10 DIAGNOSIS — F332 Major depressive disorder, recurrent severe without psychotic features: Secondary | ICD-10-CM | POA: Diagnosis present

## 2017-11-10 DIAGNOSIS — I10 Essential (primary) hypertension: Secondary | ICD-10-CM | POA: Diagnosis present

## 2017-11-10 DIAGNOSIS — J45909 Unspecified asthma, uncomplicated: Secondary | ICD-10-CM | POA: Diagnosis present

## 2017-11-10 DIAGNOSIS — Z888 Allergy status to other drugs, medicaments and biological substances status: Secondary | ICD-10-CM | POA: Diagnosis not present

## 2017-11-10 DIAGNOSIS — G4700 Insomnia, unspecified: Secondary | ICD-10-CM | POA: Diagnosis present

## 2017-11-10 DIAGNOSIS — F411 Generalized anxiety disorder: Secondary | ICD-10-CM | POA: Diagnosis present

## 2017-11-10 DIAGNOSIS — E119 Type 2 diabetes mellitus without complications: Secondary | ICD-10-CM | POA: Diagnosis present

## 2017-11-10 DIAGNOSIS — Z791 Long term (current) use of non-steroidal anti-inflammatories (NSAID): Secondary | ICD-10-CM

## 2017-11-10 DIAGNOSIS — Z79899 Other long term (current) drug therapy: Secondary | ICD-10-CM | POA: Diagnosis not present

## 2017-11-10 DIAGNOSIS — F329 Major depressive disorder, single episode, unspecified: Secondary | ICD-10-CM | POA: Diagnosis present

## 2017-11-10 DIAGNOSIS — Z90710 Acquired absence of both cervix and uterus: Secondary | ICD-10-CM

## 2017-11-10 DIAGNOSIS — F32A Depression, unspecified: Secondary | ICD-10-CM | POA: Diagnosis present

## 2017-11-10 DIAGNOSIS — Z794 Long term (current) use of insulin: Secondary | ICD-10-CM | POA: Diagnosis not present

## 2017-11-10 DIAGNOSIS — F17210 Nicotine dependence, cigarettes, uncomplicated: Secondary | ICD-10-CM | POA: Diagnosis present

## 2017-11-10 LAB — GLUCOSE, CAPILLARY
GLUCOSE-CAPILLARY: 138 mg/dL — AB (ref 70–99)
GLUCOSE-CAPILLARY: 142 mg/dL — AB (ref 70–99)
GLUCOSE-CAPILLARY: 116 mg/dL — AB (ref 70–99)
Glucose-Capillary: 165 mg/dL — ABNORMAL HIGH (ref 70–99)

## 2017-11-10 MED ORDER — ACETAMINOPHEN 325 MG PO TABS: 650.0000 mg | ORAL_TABLET | Freq: Four times a day (QID) | ORAL | Status: DC | PRN

## 2017-11-10 MED ORDER — HYDROCHLOROTHIAZIDE 25 MG PO TABS
25.0000 mg | ORAL_TABLET | Freq: Every day | ORAL | Status: DC
  Administered 2017-11-10 – 2017-11-13 (×4): 25 mg via ORAL
  Filled 2017-11-10 (×8): qty 1

## 2017-11-10 MED ORDER — ATORVASTATIN CALCIUM 80 MG PO TABS
80.0000 mg | ORAL_TABLET | Freq: Every day | ORAL | Status: DC
  Administered 2017-11-10 – 2017-11-13 (×4): 80 mg via ORAL
  Filled 2017-11-10 (×3): qty 1
  Filled 2017-11-10: qty 2
  Filled 2017-11-10 (×2): qty 1
  Filled 2017-11-10: qty 2
  Filled 2017-11-10: qty 1

## 2017-11-10 MED ORDER — METFORMIN HCL 500 MG PO TABS
ORAL_TABLET | ORAL | Status: AC
  Filled 2017-11-10: qty 2

## 2017-11-10 MED ORDER — PANTOPRAZOLE SODIUM 40 MG PO TBEC
40.0000 mg | DELAYED_RELEASE_TABLET | Freq: Every day | ORAL | Status: DC
  Administered 2017-11-10 – 2017-11-13 (×4): 40 mg via ORAL
  Filled 2017-11-10 (×8): qty 1

## 2017-11-10 MED ORDER — ALUM & MAG HYDROXIDE-SIMETH 200-200-20 MG/5ML PO SUSP: 30.0000 mL | ORAL | Status: DC | PRN

## 2017-11-10 MED ORDER — LOSARTAN POTASSIUM 50 MG PO TABS
100.0000 mg | ORAL_TABLET | Freq: Every day | ORAL | Status: DC
  Administered 2017-11-11 – 2017-11-13 (×3): 100 mg via ORAL
  Filled 2017-11-10 (×8): qty 2

## 2017-11-10 MED ORDER — PAROXETINE HCL 20 MG PO TABS
40.0000 mg | ORAL_TABLET | ORAL | Status: DC
  Administered 2017-11-11 – 2017-11-13 (×3): 40 mg via ORAL
  Filled 2017-11-10 (×6): qty 2

## 2017-11-10 MED ORDER — TRAZODONE HCL 50 MG PO TABS
50.0000 mg | ORAL_TABLET | Freq: Every evening | ORAL | Status: DC | PRN
  Administered 2017-11-11 – 2017-11-12 (×2): 50 mg via ORAL
  Filled 2017-11-10 (×2): qty 1

## 2017-11-10 MED ORDER — AMLODIPINE BESYLATE 10 MG PO TABS
10.0000 mg | ORAL_TABLET | Freq: Every day | ORAL | Status: DC
  Administered 2017-11-10 – 2017-11-13 (×4): 10 mg via ORAL
  Filled 2017-11-10 (×8): qty 1

## 2017-11-10 MED ORDER — METFORMIN HCL ER 500 MG PO TB24
1000.0000 mg | ORAL_TABLET | Freq: Two times a day (BID) | ORAL | Status: DC
  Administered 2017-11-10 – 2017-11-11 (×2): 1000 mg via ORAL
  Filled 2017-11-10 (×8): qty 2

## 2017-11-10 MED ORDER — HYDROXYZINE HCL 25 MG PO TABS: 25.0000 mg | ORAL_TABLET | Freq: Three times a day (TID) | ORAL | Status: DC | PRN

## 2017-11-10 MED ORDER — BUPROPION HCL ER (SR) 150 MG PO TB12: 150.0000 mg | ORAL_TABLET | Freq: Two times a day (BID) | ORAL | Status: DC

## 2017-11-10 MED ORDER — ACETAMINOPHEN 325 MG PO TABS
650.0000 mg | ORAL_TABLET | Freq: Once | ORAL | Status: AC
  Administered 2017-11-10: 650 mg via ORAL
  Filled 2017-11-10: qty 2

## 2017-11-10 MED ORDER — NICOTINE 7 MG/24HR TD PT24
7.0000 mg | MEDICATED_PATCH | Freq: Every day | TRANSDERMAL | Status: DC
  Filled 2017-11-10 (×6): qty 1

## 2017-11-10 MED ORDER — INSULIN ASPART 100 UNIT/ML ~~LOC~~ SOLN
0.0000 [IU] | Freq: Three times a day (TID) | SUBCUTANEOUS | Status: DC
  Administered 2017-11-11 (×3): 3 [IU] via SUBCUTANEOUS
  Administered 2017-11-12: 8 [IU] via SUBCUTANEOUS
  Administered 2017-11-12: 3 [IU] via SUBCUTANEOUS
  Administered 2017-11-12: 2 [IU] via SUBCUTANEOUS
  Administered 2017-11-13: 5 [IU] via SUBCUTANEOUS
  Administered 2017-11-13: 2 [IU] via SUBCUTANEOUS

## 2017-11-10 MED ORDER — ALBUTEROL SULFATE (2.5 MG/3ML) 0.083% IN NEBU
3.0000 mL | INHALATION_SOLUTION | Freq: Four times a day (QID) | RESPIRATORY_TRACT | Status: DC | PRN
  Administered 2017-11-13: 3 mL via RESPIRATORY_TRACT
  Filled 2017-11-10: qty 3

## 2017-11-10 MED ORDER — MAGNESIUM HYDROXIDE 400 MG/5ML PO SUSP: 30.0000 mL | Freq: Every day | ORAL | Status: DC | PRN

## 2017-11-10 MED ORDER — TIOTROPIUM BROMIDE MONOHYDRATE 18 MCG IN CAPS
1.0000 | ORAL_CAPSULE | Freq: Every day | RESPIRATORY_TRACT | Status: DC
  Administered 2017-11-11 – 2017-11-13 (×3): 18 ug via RESPIRATORY_TRACT
  Filled 2017-11-10 (×2): qty 5

## 2017-11-10 NOTE — ED Notes (Signed)
Hourly rounding reveals patient in room. No complaints, stable, in no acute distress. Q15 minute rounds and monitoring via Security Cameras to continue. 
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
## 2017-11-10 NOTE — Progress Notes (Signed)
D Patient is adjusting well to the milieu of the 400 hall in the hospital. She is cooperative, appreciative and she is involved in her care, ie she asks appropriate questions about her medications, she is knowledgeable about her dosages and times, etc and she  Suggested to this writer that she may have taken " some of those pills" earlier today- which is documented on patient's MAR.         A She shares with this writer that she has no suicidality- that she is feeling " much better now that I've been here awhile" and she is " settling in" now.     R Safety is in place.

## 2017-11-10 NOTE — ED Notes (Signed)
Patient transferred to [facility] with ASD, patient and ASD received discharge papers. Patient received belongings and verbalized she has received all of her belongings. Patient appropriate and cooperative, Denies SI/HI AVH. Vital signs taken. NAD noted.

## 2017-11-10 NOTE — BH Assessment (Signed)
This Clinical research associate received a call from pt's daughter who was irate about her mother being admitted in the hospital. She questioned "Why is my mother being held against her will? She does not want to be there!" HIPPA compliant information was provided to caller. This Clinical research associate provided caller with the accepting facility address and contact information.

## 2017-11-10 NOTE — ED Provider Notes (Signed)
-----------------------------------------   8:06 AM on 11/10/2017 -----------------------------------------   Blood pressure (!) 95/51, pulse 82, temperature 98.3 F (36.8 C), temperature source Oral, resp. rate 18, height 5\' 5"  (1.651 m), weight 78 kg, SpO2 98 %.  The patient had no acute events since last update.  Calm and cooperative at this time.  Disposition is pending Psychiatry/Behavioral Medicine team recommendations.     Sharyn Creamer, MD 11/10/17 [phone]

## 2017-11-10 NOTE — Plan of Care (Signed)
Erica Estrada is in dayroom with her peers. She denies current S.I. and reports she does not remember what she was screaming prior her admission but reports she apparently made a statement about wanting to be dead. She currently denies S.I. And is unable to identify what was making her so sad prior admission. She is quiet and cooperative and participated in group. She has no physical complaints and is contracting for safety.
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
## 2017-11-10 NOTE — ED Notes (Signed)
Hourly rounding reveals patient sleeping in room. No complaints, stable, in no acute distress. Q15 minute rounds and monitoring via Security Cameras to continue. 

## 2017-11-10 NOTE — ED Provider Notes (Signed)
Patient accepted in transfer to [facility].  Patient understanding of plan for transfer, somewhat disappointed that she requires transfer but is agreeable and understanding of the plan.  Alert and oriented no distress.  Stable for transfer.  Vitals:   11/09/17 1945 11/10/17 1038  BP: (!) 95/51 128/70  Pulse: 82 78  Resp: 18 18  Temp: 98.3 F (36.8 C) 98.2 F (36.8 C)  SpO2: 98% 100%    Normal vital signs   Sharyn Creamer, MD 11/10/17 1100

## 2017-11-10 NOTE — Tx Team (Addendum)
Initial Treatment Plan 11/10/2017 1:52 PM [name] BBC:[id number]    PATIENT STRESSORS: Marital or family conflict   PATIENT STRENGTHS: Ability for insight Active sense of humor Capable of independent living   PATIENT IDENTIFIED PROBLEMS: "coping skills"  "being myself"                   DISCHARGE CRITERIA:  Ability to meet basic life and health needs Adequate post-discharge living arrangements Improved stabilization in mood, thinking, and/or behavior  PRELIMINARY DISCHARGE PLAN: Attend aftercare/continuing care group  PATIENT/FAMILY INVOLVEMENT: This treatment plan has been presented to and reviewed with the patient, Erica Estrada, and/or family member.  The patient and family have been given the opportunity to ask questions and make suggestions.  Layla Barter, RN 11/10/2017, 1:52 PM
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
## 2017-11-10 NOTE — Plan of Care (Signed)
  Problem: Health Behavior/Discharge Planning: Goal: Identification of resources available to assist in meeting health care needs will improve Outcome: Progressing   

## 2017-11-10 NOTE — Progress Notes (Signed)
Writer called pt's daughter [name]. No answer. Writer left a Engineer, technical sales for [employer] to return writer's called in regards to verifying pt's medications.

## 2017-11-11 LAB — GLUCOSE, CAPILLARY
GLUCOSE-CAPILLARY: 153 mg/dL — AB (ref 70–99)
GLUCOSE-CAPILLARY: 185 mg/dL — AB (ref 70–99)
Glucose-Capillary: 168 mg/dL — ABNORMAL HIGH (ref 70–99)
Glucose-Capillary: 188 mg/dL — ABNORMAL HIGH (ref 70–99)

## 2017-11-11 LAB — TSH: TSH: 1.127 u[IU]/mL (ref 0.350–4.500)

## 2017-11-11 MED ORDER — BUSPIRONE HCL 15 MG PO TABS
15.0000 mg | ORAL_TABLET | Freq: Two times a day (BID) | ORAL | Status: DC
  Administered 2017-11-11 – 2017-11-13 (×4): 15 mg via ORAL
  Filled 2017-11-11 (×8): qty 1

## 2017-11-11 MED ORDER — INSULIN ASPART 100 UNIT/ML ~~LOC~~ SOLN: 0.0000 [IU] | Freq: Three times a day (TID) | SUBCUTANEOUS | Status: DC

## 2017-11-11 MED ORDER — METFORMIN HCL 500 MG PO TABS
500.0000 mg | ORAL_TABLET | Freq: Two times a day (BID) | ORAL | Status: DC
  Administered 2017-11-11 – 2017-11-13 (×4): 500 mg via ORAL
  Filled 2017-11-11 (×10): qty 1

## 2017-11-11 MED ORDER — DOCUSATE SODIUM 100 MG PO CAPS
100.0000 mg | ORAL_CAPSULE | Freq: Every day | ORAL | Status: DC
  Administered 2017-11-11 – 2017-11-13 (×3): 100 mg via ORAL
  Filled 2017-11-11 (×5): qty 1

## 2017-11-11 MED ORDER — GABAPENTIN 100 MG PO CAPS
100.0000 mg | ORAL_CAPSULE | Freq: Every day | ORAL | Status: DC
  Administered 2017-11-11 – 2017-11-12 (×2): 100 mg via ORAL
  Filled 2017-11-11 (×5): qty 1

## 2017-11-11 MED ORDER — INSULIN DEGLUDEC 100 UNIT/ML ~~LOC~~ SOPN: 50.0000 [IU] | PEN_INJECTOR | Freq: Every day | SUBCUTANEOUS | Status: DC

## 2017-11-11 MED ORDER — ASPIRIN EC 81 MG PO TBEC
81.0000 mg | DELAYED_RELEASE_TABLET | Freq: Every day | ORAL | Status: DC
  Administered 2017-11-11 – 2017-11-13 (×3): 81 mg via ORAL
  Filled 2017-11-11 (×5): qty 1

## 2017-11-11 MED ORDER — METFORMIN HCL ER 500 MG PO TB24
500.0000 mg | ORAL_TABLET | Freq: Two times a day (BID) | ORAL | Status: DC
  Filled 2017-11-11 (×2): qty 1

## 2017-11-11 MED ORDER — INSULIN ASPART PROT & ASPART (70-30 MIX) 100 UNIT/ML ~~LOC~~ SUSP: 20.0000 [IU] | Freq: Two times a day (BID) | SUBCUTANEOUS | Status: DC

## 2017-11-11 MED ORDER — INSULIN GLARGINE 100 UNIT/ML ~~LOC~~ SOLN
50.0000 [IU] | Freq: Every day | SUBCUTANEOUS | Status: DC
  Administered 2017-11-11 – 2017-11-12 (×2): 50 [IU] via SUBCUTANEOUS

## 2017-11-11 MED ORDER — MELOXICAM 7.5 MG PO TABS
7.5000 mg | ORAL_TABLET | Freq: Two times a day (BID) | ORAL | Status: DC
  Administered 2017-11-11 – 2017-11-13 (×4): 7.5 mg via ORAL
  Filled 2017-11-11 (×8): qty 1

## 2017-11-11 NOTE — Progress Notes (Signed)
D:  Patient's self inventory sheet, patient sleeps good, no sleep medication given.  Good appetite, normal energy level, good concentration.   Rated depression, hopeless and anxiety #9.  Denied withdrawals.  Denied SI.  Denied physical problems.  Denied physical pain.  Goal is stay focused and strong strong.  No discharge plans. A:  Medications administered per MD orders.  Emotional support and encouragement given patient. R:  Denied SI and Hi, contracts for safety.  Denied A/V hallucinations.  Safety maintained with 15 minute checks.

## 2017-11-11 NOTE — Progress Notes (Signed)
Adult Psychoeducational Group Note  Date:  11/11/2017 Time:  10:39 PM  Group Topic/Focus:  Wrap-Up Group:   The focus of this group is to help patients review their daily goal of treatment and discuss progress on daily workbooks.  Participation Level:  Active  Participation Quality:  Appropriate  Affect:  Appropriate  Cognitive:  Appropriate  Insight: Appropriate  Engagement in Group:  Engaged  Modes of Intervention:  Discussion  Additional Comments:  Pt worked on being strong/positive and trying not overly worry about things she can't control.  Pt rated the day at a 9/10.  Jj Enyeart 11/11/2017, 10:39 PM

## 2017-11-11 NOTE — Progress Notes (Signed)
Recreation Therapy Notes  Date: 11/11/17 Time: 0930 Location: [address] Dayroom  Group Topic: Stress Management  Goal Area(s) Addresses:  Patient will verbalize importance of using healthy stress management.  Patient will identify positive emotions associated with healthy stress management.   Intervention: Stress Management  Activity :  Meditation.  LRT introduced the stress management technique of meditation.  Patients were to listen and follow along as the meditation played in order to relax.  Education:  Stress Management, Discharge Planning.   Education Outcome: Acknowledges edcuation/In group clarification offered/Needs additional education  Clinical Observations/Feedback: Pt did not attend group.     Forest Pruden, LRT/CTRS         Eviana Sibilia A 11/11/2017 12:19 PM 

## 2017-11-11 NOTE — Progress Notes (Signed)
Patient ID: Erica Estrada, female   DOB: [birth year], 66 y.o.   MRN: [id number]  Patient's pharmacy currently closed due to holiday [facility] on [address]). With consent from patient, patient's daughter Lattie Corns [phone]) contacted to obtain list of medications. Medication list provided by daughter. Copy of list placed on chart and handed to MD.

## 2017-11-11 NOTE — Plan of Care (Signed)
Nurse discussed anxiety, depression and coping skills with patient.  

## 2017-11-11 NOTE — Progress Notes (Signed)
[facility] MD Progress Note  11/11/2017 2:28 PM Erica Estrada  MRN:  [id number] Subjective: Patient is seen and examined.  Patient is a 67 year old female with a past psychiatric history significant for major depression, generalized anxiety disorder and posttraumatic stress disorder who is seen in follow-up.  She states she feels good today.  She denied any suicidal ideation.  She is pleasant and smiling.  She is engaging.  She is not sure what happened yesterday.  We finally got a list of her active medications, and she was not taking the Wellbutrin at home.  She was taking buspirone.  With regards to her diabetic medication she is also on metformin 500 mg p.o. twice daily versus the Metformin extended release at thousand milligrams p.o. twice daily.  Her sugars have been elevated.  She is also taking Jardiance, Tresiba, and as well Humalog 20 units and a sliding scale up to 32 units with meals 3 times daily.  Her laboratories are present, and her creatinine is 0.84.  Her blood sugar this morning was 168.  Thyroid was normal at 1.127.  No anemias.  Her blood pressure was 111/65 this morning, she is afebrile and her heart rate was 75.  Sleep was not recorded. Principal Problem: <principal problem not specified> Diagnosis:   Patient Active Problem List   Diagnosis Date Noted  . MDD (major depressive disorder) [F32.9] 11/10/2017  . Severe recurrent major depression without psychotic features (HCC) [F33.2] 11/10/2017  . Posttraumatic stress disorder [F43.10]    Total Time spent with patient: 15 minutes  Past Psychiatric History: See admission H&P  Past Medical History:  Past Medical History:  Diagnosis Date  . Diabetes mellitus without complication (HCC)   . Hypertension     Past Surgical History:  Procedure Laterality Date  . ABDOMINAL HYSTERECTOMY    . EYE SURGERY     Family History: History reviewed. No pertinent family history. Family Psychiatric  History: See admission H&P Social History:   Social History   Substance and Sexual Activity  Alcohol Use No  . Frequency: Never     Social History   Substance and Sexual Activity  Drug Use No    Social History   Socioeconomic History  . Marital status: Married    Spouse name: Not on file  . Number of children: Not on file  . Years of education: Not on file  . Highest education level: Not on file  Occupational History  . Not on file  Social Needs  . Financial resource strain: Not on file  . Food insecurity:    Worry: Not on file    Inability: Not on file  . Transportation needs:    Medical: Not on file    Non-medical: Not on file  Tobacco Use  . Smoking status: Current Every Day Smoker    Packs/day: 0.50  . Smokeless tobacco: Never Used  Substance and Sexual Activity  . Alcohol use: No    Frequency: Never  . Drug use: No  . Sexual activity: Yes  Lifestyle  . Physical activity:    Days per week: Not on file    Minutes per session: Not on file  . Stress: Not on file  Relationships  . Social connections:    Talks on phone: Not on file    Gets together: Not on file    Attends religious service: Not on file    Active member of club or organization: Not on file    Attends meetings of clubs or  organizations: Not on file    Relationship status: Not on file  Other Topics Concern  . Not on file  Social History Narrative  . Not on file   Additional Social History:                         Sleep: Fair  Appetite:  Good  Current Medications: Current Facility-Administered Medications  Medication Dose Route Frequency Provider Last Rate Last Dose  . acetaminophen (TYLENOL) tablet 650 mg  650 mg Oral Q6H PRN Antonieta Pert, MD      . albuterol (PROVENTIL) (2.5 MG/3ML) 0.083% nebulizer solution 3 mL  3 mL Inhalation Q6H PRN He, Jun, MD      . alum & mag hydroxide-simeth (MAALOX/MYLANTA) 200-200-20 MG/5ML suspension 30 mL  30 mL Oral Q4H PRN Nira Conn A, NP      . amLODipine (NORVASC) tablet 10  mg  10 mg Oral Daily He, Jun, MD   10 mg at 11/11/17 0801  . aspirin EC tablet 81 mg  81 mg Oral Daily Antonieta Pert, MD      . atorvastatin (LIPITOR) tablet 80 mg  80 mg Oral Daily He, Jun, MD   80 mg at 11/11/17 0801  . busPIRone (BUSPAR) tablet 15 mg  15 mg Oral BID Antonieta Pert, MD      . docusate sodium (COLACE) capsule 100 mg  100 mg Oral Daily Antonieta Pert, MD      . gabapentin (NEURONTIN) capsule 100 mg  100 mg Oral QHS Antonieta Pert, MD      . losartan (COZAAR) tablet 100 mg  100 mg Oral Daily He, Jun, MD   100 mg at 11/11/17 0802   And  . hydrochlorothiazide (HYDRODIURIL) tablet 25 mg  25 mg Oral Daily He, Jun, MD   25 mg at 11/11/17 1610  . hydrOXYzine (ATARAX/VISTARIL) tablet 25 mg  25 mg Oral TID PRN Nira Conn A, NP      . insulin aspart (novoLOG) injection 0-15 Units  0-15 Units Subcutaneous TID WC He, Jun, MD   3 Units at 11/11/17 1216  . insulin aspart protamine- aspart (NOVOLOG MIX 70/30) injection 20 Units  20 Units Subcutaneous BID WC Antonieta Pert, MD      . insulin degludec (TRESIBA) 100 UNIT/ML FlexTouch Pen 50 Units  50 Units Subcutaneous Q2200 Antonieta Pert, MD      . magnesium hydroxide (MILK OF MAGNESIA) suspension 30 mL  30 mL Oral Daily PRN Antonieta Pert, MD      . meloxicam [facility]) tablet 7.5 mg  7.5 mg Oral BID Antonieta Pert, MD      . metFORMIN (GLUCOPHAGE-XR) 24 hr tablet 500 mg  500 mg Oral BID WC Antonieta Pert, MD      . nicotine (NICODERM CQ - dosed in mg/24 hr) patch 7 mg  7 mg Transdermal Daily Antonieta Pert, MD      . pantoprazole (PROTONIX) EC tablet 40 mg  40 mg Oral Daily He, Jun, MD   40 mg at 11/11/17 0807  . PARoxetine (PAXIL) tablet 40 mg  40 mg Oral BH-q7a He, Jun, MD   40 mg at 11/11/17 0808  . tiotropium [facility]) inhalation capsule 18 mcg  1 capsule Inhalation Daily He, Jun, MD   18 mcg at 11/11/17 0809  . traZODone (DESYREL) tablet 50 mg  50 mg Oral QHS PRN Antonieta Pert, MD  Lab  Results:  Results for orders placed or performed during the hospital encounter of 11/10/17 (from the past 48 hour(s))  Glucose, capillary     Status: Abnormal   Collection Time: 11/10/17  1:13 PM  Result Value Ref Range   Glucose-Capillary 142 (H) 70 - 99 mg/dL   Comment 1 Notify RN   Glucose, capillary     Status: Abnormal   Collection Time: 11/10/17  5:04 PM  Result Value Ref Range   Glucose-Capillary 116 (H) 70 - 99 mg/dL   Comment 1 Notify RN    Comment 2 Document in Chart   Glucose, capillary     Status: Abnormal   Collection Time: 11/10/17  9:02 PM  Result Value Ref Range   Glucose-Capillary 165 (H) 70 - 99 mg/dL   Comment 1 Notify RN    Comment 2 Document in Chart   Glucose, capillary     Status: Abnormal   Collection Time: 11/11/17  6:05 AM  Result Value Ref Range   Glucose-Capillary 153 (H) 70 - 99 mg/dL   Comment 1 Notify RN    Comment 2 Document in Chart   TSH     Status: None   Collection Time: 11/11/17  6:24 AM  Result Value Ref Range   TSH 1.127 0.350 - 4.500 uIU/mL    Comment: Performed by a 3rd Generation assay with a functional sensitivity of <=0.01 uIU/mL. Performed at [facility], [address] [place], [place] [address]   Glucose, capillary     Status: Abnormal   Collection Time: 11/11/17 12:01 PM  Result Value Ref Range   Glucose-Capillary 168 (H) 70 - 99 mg/dL   Comment 1 Notify RN    Comment 2 Document in Chart     Blood Alcohol level:  Lab Results  Component Value Date   ETH <10 11/09/2017    Metabolic Disorder Labs: No results found for: HGBA1C, MPG No results found for: PROLACTIN No results found for: CHOL, TRIG, HDL, CHOLHDL, VLDL, LDLCALC  Physical Findings: AIMS: Facial and Oral Movements Muscles of Facial Expression: None, normal Lips and Perioral Area: None, normal Jaw: None, normal Tongue: None, normal,Extremity Movements Upper (arms, wrists, hands, fingers): None, normal Lower (legs, knees, ankles, toes):  None, normal, Trunk Movements Neck, shoulders, hips: None, normal, Overall Severity Severity of abnormal movements (highest score from questions above): None, normal Incapacitation due to abnormal movements: None, normal Patient's awareness of abnormal movements (rate only patient's report): No Awareness, Dental Status Current problems with teeth and/or dentures?: No Does patient usually wear dentures?: No  CIWA:    COWS:     Musculoskeletal: Strength & Muscle Tone: within normal limits Gait & Station: normal Patient leans: N/A  Psychiatric Specialty Exam: Physical Exam  Nursing note and vitals reviewed. Constitutional: She is oriented to person, place, and time. She appears well-developed and well-nourished.  HENT:  Head: Normocephalic and atraumatic.  Respiratory: Effort normal.  Neurological: She is alert and oriented to person, place, and time.    ROS  Blood pressure (!) 101/59, pulse 75, temperature 98.4 F (36.9 C), temperature source Oral, resp. rate 16, height 5\' 4"  (1.626 m), weight 76.7 kg.Body mass index is 29.01 kg/m.  General Appearance: Casual  Eye Contact:  Fair  Speech:  Normal Rate  Volume:  Normal  Mood:  Euthymic  Affect:  Congruent  Thought Process:  Coherent and Descriptions of Associations: Intact  Orientation:  Full (Time, Place, and Person)  Thought Content:  Logical  Suicidal Thoughts:  No  Homicidal Thoughts:  No  Memory:  Immediate;   Fair Recent;   Fair Remote;   Fair  Judgement:  Intact  Insight:  Fair  Psychomotor Activity:  Normal  Concentration:  Concentration: Fair and Attention Span: Fair  Recall:  [employer] of Knowledge:  Fair  Language:  Fair  Akathisia:  Negative  Handed:  Right  AIMS (if indicated):     Assets:  Communication Skills Desire for Improvement Financial Resources/Insurance Housing Leisure Time Physical Health Resilience Social Support Talents/Skills  ADL's:  Intact  Cognition:  WNL  Sleep:  Number of  Hours: 6.5     Treatment Plan Summary: Daily contact with patient to assess and evaluate symptoms and progress in treatment, Medication management and Plan : Patient is seen and examined.  Patient is a 67 year old female with a past psychiatric history significant for major depression and generalized anxiety disorder she is seen in follow-up.  #1 we will continue the Paxil at 40 mg p.o. daily for mood and anxiety.  BuSpar 15 mg p.o. twice daily will be added.  At home she was taking 15 mg p.o. daily.  #2 aspirin at 81 mg a day will be restarted, #3 meloxicam 7.5 mg p.o. twice daily for arthritic pain will be restarted, #4 metformin short acting will be added 500 mg p.o. twice daily for diabetes, and the extended release Metformin will be stopped.  #5 Protonix 40 mg p.o. daily has been substituted for omeprazole for GERD, #6 gabapentin 100 mg p.o. nightly for sleep will be re-added.  #7 Tresiba 50 units subcu nightly will be added, #7 Humalog 20 units will be substituted and be given as insulin aspartate 70/3020 units subcu twice daily with meals, a sliding scale will be continued.  #8 discharge planning with probable discharge in 1 to 2 days.  Antonieta Pert, MD 11/11/2017, 2:28 PM

## 2017-11-11 NOTE — Tx Team (Signed)
Interdisciplinary Treatment and Diagnostic Plan Update  11/11/2017 Time of Session: 9:25am Erica Estrada MRN: [id number]  Principal Diagnosis: <principal problem not specified>  Secondary Diagnoses: Active Problems:   MDD (major depressive disorder)   Severe recurrent major depression without psychotic features (HCC)   Posttraumatic stress disorder   Current Medications:  Current Facility-Administered Medications  Medication Dose Route Frequency Provider Last Rate Last Dose  . acetaminophen (TYLENOL) tablet 650 mg  650 mg Oral Q6H PRN Sharma Covert, MD      . albuterol (PROVENTIL) (2.5 MG/3ML) 0.083% nebulizer solution 3 mL  3 mL Inhalation Q6H PRN He, Jun, MD      . alum & mag hydroxide-simeth (MAALOX/MYLANTA) 200-200-20 MG/5ML suspension 30 mL  30 mL Oral Q4H PRN Lindon Romp A, NP      . amLODipine (NORVASC) tablet 10 mg  10 mg Oral Daily He, Jun, MD   10 mg at 11/11/17 0801  . atorvastatin (LIPITOR) tablet 80 mg  80 mg Oral Daily He, Jun, MD   80 mg at 11/11/17 0801  . losartan (COZAAR) tablet 100 mg  100 mg Oral Daily He, Jun, MD   100 mg at 11/11/17 0802   And  . hydrochlorothiazide (HYDRODIURIL) tablet 25 mg  25 mg Oral Daily He, Jun, MD   25 mg at 11/11/17 1103  . hydrOXYzine (ATARAX/VISTARIL) tablet 25 mg  25 mg Oral TID PRN Lindon Romp A, NP      . insulin aspart (novoLOG) injection 0-15 Units  0-15 Units Subcutaneous TID WC He, Jun, MD   3 Units at 11/11/17 1594  . magnesium hydroxide (MILK OF MAGNESIA) suspension 30 mL  30 mL Oral Daily PRN Sharma Covert, MD      . metFORMIN (GLUCOPHAGE-XR) 24 hr tablet 1,000 mg  1,000 mg Oral BID WC He, Jun, MD   1,000 mg at 11/11/17 0810  . nicotine (NICODERM CQ - dosed in mg/24 hr) patch 7 mg  7 mg Transdermal Daily Sharma Covert, MD      . pantoprazole (PROTONIX) EC tablet 40 mg  40 mg Oral Daily He, Jun, MD   40 mg at 11/11/17 0807  . PARoxetine (PAXIL) tablet 40 mg  40 mg Oral BH-q7a He, Jun, MD   40 mg at 11/11/17  0808  . tiotropium [facility]) inhalation capsule 18 mcg  1 capsule Inhalation Daily He, Jun, MD   18 mcg at 11/11/17 0809  . traZODone (DESYREL) tablet 50 mg  50 mg Oral QHS PRN Sharma Covert, MD       PTA Medications: Medications Prior to Admission  Medication Sig Dispense Refill Last Dose  . amLODipine (NORVASC) 10 MG tablet Take 1 tablet by mouth daily.   11/10/2017  . atorvastatin (LIPITOR) 80 MG tablet Take 1 tablet by mouth daily.   11/10/2017  . betamethasone dipropionate (DIPROLENE) 0.05 % cream Apply 1 application topically daily as needed.   11/10/2017  . buPROPion (ZYBAN) 150 MG 12 hr tablet Take 150 mg by mouth 2 (two) times daily.   11/10/2017  . clotrimazole-betamethasone (LOTRISONE) lotion Apply topically 2 (two) times daily. 30 mL 0 11/10/2017  . HYDROcodone-acetaminophen (NORCO/VICODIN) 5-325 MG tablet Take 1 tablet by mouth every 6 (six) hours as needed for moderate pain. 15 tablet 0 11/10/2017  . JARDIANCE 10 MG TABS tablet Take 1 tablet by mouth daily.   11/10/2017  . losartan-hydrochlorothiazide (HYZAAR) 100-25 MG tablet Take 1 tablet by mouth daily.   11/10/2017  . metformin (  FORTAMET) 1000 MG (OSM) 24 hr tablet Take 1 tablet by mouth 2 (two) times daily with a meal.    11/10/2017  . methocarbamol (ROBAXIN) 500 MG tablet Take 1 tablet (500 mg total) by mouth 4 (four) times daily. 12 tablet 0 11/10/2017  . naproxen (NAPROSYN) 500 MG tablet Take 1 tablet by mouth 2 (two) times daily.   11/10/2017  . NOVOLOG FLEXPEN 100 UNIT/ML FlexPen Inject 20 Units into the skin 3 (three) times daily with meals. Plus sliding scale for up to 100 units TDD   11/10/2017  . omeprazole (PRILOSEC) 40 MG capsule Take 1 capsule by mouth daily.   11/10/2017  . ondansetron (ZOFRAN ODT) 4 MG disintegrating tablet Take 1 tablet (4 mg total) by mouth every 8 (eight) hours as needed for nausea or vomiting. 20 tablet 0 11/10/2017  . PARoxetine (PAXIL) 40 MG tablet Take 40 mg by mouth every morning.   11/10/2017  . SPIRIVA  HANDIHALER 18 MCG inhalation capsule Place 1 capsule into inhaler and inhale daily.   11/10/2017  . TRESIBA FLEXTOUCH 200 UNIT/ML SOPN Inject 60 Units into the skin daily.   11/10/2017  . VENTOLIN HFA 108 (90 Base) MCG/ACT inhaler Inhale 2 puffs into the lungs every 6 (six) hours as needed.   11/10/2017  . insulin lispro (HUMALOG KWIKPEN) 100 UNIT/ML KiwkPen Inject 20 Units into the skin 3 (three) times daily. Plus sliding scale for up to 100 units TDD   Not Taking at Unknown time    Patient Stressors: Marital or family conflict  Patient Strengths: Ability for insight Active sense of humor Capable of independent living  Treatment Modalities: Medication Management, Group therapy, Case management,  1 to 1 session with clinician, Psychoeducation, Recreational therapy.   Physician Treatment Plan for Primary Diagnosis: <principal problem not specified> Long Term Goal(s): Improvement in symptoms so as ready for discharge Improvement in symptoms so as ready for discharge   Short Term Goals: Ability to identify changes in lifestyle to reduce recurrence of condition will improve Ability to verbalize feelings will improve Ability to disclose and discuss suicidal ideas Ability to demonstrate self-control will improve Ability to identify and develop effective coping behaviors will improve Ability to maintain clinical measurements within normal limits will improve Ability to identify changes in lifestyle to reduce recurrence of condition will improve Ability to verbalize feelings will improve Ability to disclose and discuss suicidal ideas Ability to demonstrate self-control will improve Ability to identify and develop effective coping behaviors will improve Ability to maintain clinical measurements within normal limits will improve  Medication Management: Evaluate patient's response, side effects, and tolerance of medication regimen.  Therapeutic Interventions: 1 to 1 sessions, Unit Group sessions  and Medication administration.  Evaluation of Outcomes: Not Met  Physician Treatment Plan for Secondary Diagnosis: Active Problems:   MDD (major depressive disorder)   Severe recurrent major depression without psychotic features (HCC)   Posttraumatic stress disorder  Long Term Goal(s): Improvement in symptoms so as ready for discharge Improvement in symptoms so as ready for discharge   Short Term Goals: Ability to identify changes in lifestyle to reduce recurrence of condition will improve Ability to verbalize feelings will improve Ability to disclose and discuss suicidal ideas Ability to demonstrate self-control will improve Ability to identify and develop effective coping behaviors will improve Ability to maintain clinical measurements within normal limits will improve Ability to identify changes in lifestyle to reduce recurrence of condition will improve Ability to verbalize feelings will improve Ability to disclose  and discuss suicidal ideas Ability to demonstrate self-control will improve Ability to identify and develop effective coping behaviors will improve Ability to maintain clinical measurements within normal limits will improve     Medication Management: Evaluate patient's response, side effects, and tolerance of medication regimen.  Therapeutic Interventions: 1 to 1 sessions, Unit Group sessions and Medication administration.  Evaluation of Outcomes: Not Met   RN Treatment Plan for Primary Diagnosis: <principal problem not specified> Long Term Goal(s): Knowledge of disease and therapeutic regimen to maintain health will improve  Short Term Goals: Ability to verbalize frustration and anger appropriately will improve, Ability to verbalize feelings will improve, Ability to disclose and discuss suicidal ideas, Ability to identify and develop effective coping behaviors will improve and Compliance with prescribed medications will improve  Medication Management: RN will  administer medications as ordered by provider, will assess and evaluate patient's response and provide education to patient for prescribed medication. RN will report any adverse and/or side effects to prescribing provider.  Therapeutic Interventions: 1 on 1 counseling sessions, Psychoeducation, Medication administration, Evaluate responses to treatment, Monitor vital signs and CBGs as ordered, Perform/monitor CIWA, COWS, AIMS and Fall Risk screenings as ordered, Perform wound care treatments as ordered.  Evaluation of Outcomes: Not Met   LCSW Treatment Plan for Primary Diagnosis: <principal problem not specified> Long Term Goal(s): Safe transition to appropriate next level of care at discharge, Engage patient in therapeutic group addressing interpersonal concerns.  Short Term Goals: Engage patient in aftercare planning with referrals and resources and Increase skills for wellness and recovery  Therapeutic Interventions: Assess for all discharge needs, 1 to 1 time with Social worker, Explore available resources and support systems, Assess for adequacy in community support network, Educate family and significant other(s) on suicide prevention, Complete Psychosocial Assessment, Interpersonal group therapy.  Evaluation of Outcomes: Not Met   Progress in Treatment: Attending groups: No. New to unit Participating in groups: No. Taking medication as prescribed: Yes. Toleration medication: Yes. Family/Significant other contact made: No, will contact:  if patient consents Patient understands diagnosis: Yes. Discussing patient identified problems/goals with staff: Yes. Medical problems stabilized or resolved: Yes. Denies suicidal/homicidal ideation: Yes. Issues/concerns per patient self-inventory: No. Other:   New problem(s) identified: None  New Short Term/Long Term Goal(s):medication stabilization, elimination of SI thoughts, development of comprehensive mental wellness plan.   Patient  Goals:  "To get better, improve my actions and become stronger"  Discharge Plan or Barriers: CSW will assess for an appropriate discharge plan.   Reason for Continuation of Hospitalization: Depression Medication stabilization Suicidal ideation  Estimated Length of Stay: 3-5 days  Attendees: Patient: Erica Estrada  11/11/2017 8:30 AM  Physician: Dr. Myles Lipps, MD 11/11/2017 8:30 AM  Nursing: Rise Paganini.Raliegh Ip, RN 11/11/2017 8:30 AM  RN Care Manager:X 11/11/2017 8:30 AM  Social Worker: Radonna Ricker, [place] 11/11/2017 8:30 AM  Recreational Therapist: X 11/11/2017 8:30 AM  Other: X 11/11/2017 8:30 AM  Other: X 11/11/2017 8:30 AM  Other:X 11/11/2017 8:30 AM    Scribe for Treatment Team: Marylee Floras, [place] 11/11/2017 8:30 AM
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
## 2017-11-11 NOTE — Progress Notes (Signed)
Adult Psychoeducational Group Note  Date:  11/11/2017 Time:  4:52 PM  Group Topic/Focus:  Developing a Wellness Toolbox:   The focus of this group is to help patients develop a "wellness toolbox" with skills and strategies to promote recovery upon discharge.  Participation Level:  Active  Participation Quality:  Appropriate  Affect:  Appropriate  Cognitive:  Appropriate  Insight: Appropriate  Engagement in Group:  Engaged  Modes of Intervention:  Discussion  Additional Comments:  Pt attended group and shared what parts of the wellness she needed to work on.   Erica Estrada 11/11/2017, 4:52 PM

## 2017-11-12 DIAGNOSIS — G4700 Insomnia, unspecified: Secondary | ICD-10-CM

## 2017-11-12 DIAGNOSIS — F419 Anxiety disorder, unspecified: Secondary | ICD-10-CM

## 2017-11-12 DIAGNOSIS — F331 Major depressive disorder, recurrent, moderate: Secondary | ICD-10-CM

## 2017-11-12 LAB — GLUCOSE, CAPILLARY
GLUCOSE-CAPILLARY: 175 mg/dL — AB (ref 70–99)
Glucose-Capillary: 144 mg/dL — ABNORMAL HIGH (ref 70–99)
Glucose-Capillary: 297 mg/dL — ABNORMAL HIGH (ref 70–99)
Glucose-Capillary: 179 mg/dL — ABNORMAL HIGH (ref 70–99)
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
## 2017-11-12 NOTE — BHH Group Notes (Signed)
Pt attended spiritual care group on grief and loss facilitated by chaplain Ygnacio Fecteau   Group opened with brief discussion and psycho-social ed around grief and loss in relationships and in relation to self.  Group identifyed life patterns, circumstances, changes that are connected to grief response. Established group norm of speaking from own life experience. Group goal of establishing open and affirming space for members to share loss and experience with grief, normalize grief experience and provide psycho social education and grief support. Group engaged in facilitated discussion or grief process.  Engaged with Worden's 4 tasks of grief.    

## 2017-11-12 NOTE — Progress Notes (Signed)
D: Patient is pleasant; she feels she is ready for discharge.  Patient continues to report some depressive symptoms, however, she feels better since admission.  She rates her depression and hopelessness as a 9; anxiety as an 8.  She is now focused on discharge.  Patient is interacting well with staff and peers.  A: Continue to monitor medication management and MD orders.  Safety checks completed every 15 minutes per protocol.  Offer support and encouragement as needed.  R: Patient is receptive to staff; her behavior is appropriate.

## 2017-11-12 NOTE — Progress Notes (Signed)
D: Pt denies SI/HI/AVH. Pt is pleasant and cooperative. Pt stated she was feeling " good" and was ready to go home. Pt visible on unit interacting with peers at times.  A: Pt was offered support and encouragement. Pt was given scheduled medications. Pt was encourage to attend groups. Q 15 minute checks were done for safety.  R:Pt attends groups and interacts well with peers and staff. Pt is taking medication. Pt has no complaints.Pt receptive to treatment and safety maintained on unit.  Problem: Education: Goal: Ability to make informed decisions regarding treatment will improve Outcome: Progressing   Problem: Coping: Goal: Coping ability will improve Outcome: Progressing   Problem: Medication: Goal: Compliance with prescribed medication regimen will improve Outcome: Progressing   Problem: Self-Concept: Goal: Will verbalize positive feelings about self Outcome: Progressing   Problem: Activity: Goal: Interest or engagement in leisure activities will improve Outcome: Progressing

## 2017-11-12 NOTE — B[facility] Group Notes (Signed)
LCSW Group Therapy Note 11/12/2017 11:03 AM  Type of Therapy and Topic: Group Therapy: Overcoming Obstacles  Participation Level: Active  Description of Group:  In this group patients will be encouraged to explore what they see as obstacles to their own wellness and recovery. They will be guided to discuss their thoughts, feelings, and behaviors related to these obstacles. The group will process together ways to cope with barriers, with attention given to specific choices patients can make. Each patient will be challenged to identify changes they are motivated to make in order to overcome their obstacles. This group will be process-oriented, with patients participating in exploration of their own experiences as well as giving and receiving support and challenge from other group members.  Therapeutic Goals: 1. Patient will identify personal and current obstacles as they relate to admission. 2. Patient will identify barriers that currently interfere with their wellness or overcoming obstacles.  3. Patient will identify feelings, thought process and behaviors related to these barriers. 4. Patient will identify two changes they are willing to make to overcome these obstacles:   Summary of Patient Progress  Erica Estrada was engaged and participated throughout the group session. Erica Estrada reports that her main obstacle currently is "I'm nervous about discharging". Erica Estrada states that she is worried about confronting the same stressors that led her to the hospital. Erica Estrada states that she plans to use the coping skills and tools she has learned so far to help her manage her life once she returns to the community.     Therapeutic Modalities:  Cognitive Behavioral Therapy Solution Focused Therapy Motivational Interviewing Relapse Prevention Therapy   Alcario Drought Clinical Social Worker
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
## 2017-11-12 NOTE — Progress Notes (Signed)
D: Pt denies SI/HI/AVH. Pt is pleasant and cooperative. Pt stated she was doing good this evening . Pt visible on the unit  A: Pt was offered support and encouragement. Pt was given scheduled medications. Pt was encourage to attend groups. Q 15 minute checks were done for safety.   R:Pt attends groups and interacts well with peers and staff. Pt is taking medication. Pt has no complaints.Pt receptive to treatment and safety maintained on unit.   Problem: Education: Goal: Ability to make informed decisions regarding treatment will improve Outcome: Progressing   Problem: Coping: Goal: Coping ability will improve Outcome: Progressing

## 2017-11-13 ENCOUNTER — Encounter (HOSPITAL_COMMUNITY): Payer: Self-pay | Admitting: Behavioral Health

## 2017-11-13 LAB — BASIC METABOLIC PANEL
Anion gap: 8 (ref 5–15)
BUN: 15 mg/dL (ref 8–23)
CALCIUM: 9.7 mg/dL (ref 8.9–10.3)
CHLORIDE: 104 mmol/L (ref 98–111)
CO2: 29 mmol/L (ref 22–32)
CREATININE: 0.77 mg/dL (ref 0.44–1.00)
GFR calc Af Amer: >60 mL/min (ref >60)
GFR calc non Af Amer: >60 mL/min (ref >60)
GLUCOSE: 166 mg/dL — AB (ref 70–99)
Potassium: 3.9 mmol/L (ref 3.5–5.1)
Sodium: 141 mmol/L (ref 135–145)

## 2017-11-13 LAB — HEMOGLOBIN A1C
HEMOGLOBIN A1C: 7.1 % — AB (ref 4.8–5.6)
Mean Plasma Glucose: 157.07 mg/dL

## 2017-11-13 LAB — GLUCOSE, CAPILLARY
Glucose-Capillary: 140 mg/dL — ABNORMAL HIGH (ref 70–99)
Glucose-Capillary: 228 mg/dL — ABNORMAL HIGH (ref 70–99)

## 2017-11-13 MED ORDER — BUSPIRONE HCL 15 MG PO TABS: 15.0000 mg | ORAL_TABLET | Freq: Two times a day (BID) | ORAL | 0 refills | Status: DC

## 2017-11-13 MED ORDER — MELOXICAM 7.5 MG PO TABS: 7.5000 mg | ORAL_TABLET | Freq: Two times a day (BID) | ORAL | 0 refills | Status: DC

## 2017-11-13 MED ORDER — AMLODIPINE BESYLATE 10 MG PO TABS: 10.0000 mg | ORAL_TABLET | Freq: Every day | ORAL | 0 refills | Status: DC

## 2017-11-13 MED ORDER — ATORVASTATIN CALCIUM 80 MG PO TABS: 80.0000 mg | ORAL_TABLET | Freq: Every day | ORAL | 0 refills | Status: AC

## 2017-11-13 MED ORDER — INSULIN GLARGINE 100 UNIT/ML ~~LOC~~ SOLN: 50.0000 [IU] | Freq: Every day | SUBCUTANEOUS | 0 refills | Status: DC

## 2017-11-13 MED ORDER — HYDROXYZINE HCL 25 MG PO TABS: 25.0000 mg | ORAL_TABLET | Freq: Three times a day (TID) | ORAL | 0 refills | Status: DC | PRN

## 2017-11-13 MED ORDER — GABAPENTIN 100 MG PO CAPS: 100.0000 mg | ORAL_CAPSULE | Freq: Every day | ORAL | 0 refills | Status: DC

## 2017-11-13 MED ORDER — HYDROCHLOROTHIAZIDE 25 MG PO TABS: 25.0000 mg | ORAL_TABLET | Freq: Every day | ORAL | 0 refills | Status: DC

## 2017-11-13 MED ORDER — TRAZODONE HCL 50 MG PO TABS: 50.0000 mg | ORAL_TABLET | Freq: Every evening | ORAL | 0 refills | Status: DC | PRN

## 2017-11-13 MED ORDER — METFORMIN HCL 500 MG PO TABS: 500.0000 mg | ORAL_TABLET | Freq: Two times a day (BID) | ORAL | 0 refills | Status: DC

## 2017-11-13 MED ORDER — PAROXETINE HCL 40 MG PO TABS: 40.0000 mg | ORAL_TABLET | ORAL | 0 refills | Status: AC

## 2017-11-13 MED ORDER — LOSARTAN POTASSIUM 100 MG PO TABS: 100.0000 mg | ORAL_TABLET | Freq: Every day | ORAL | 0 refills | Status: DC

## 2017-11-13 NOTE — Progress Notes (Signed)
  [facility] Adult Case Management Discharge Plan :  Will you be returning to the same living situation after discharge:  Yes,  patient reports she will return home with her daughter at discharge At discharge, do you have transportation home?: Yes,  patient reports her daughter will pick her up at discharge Do you have the ability to pay for your medications: Yes,  [employer], [employer], support from daughter  Release of information consent forms completed and in the chart;  Patient's signature needed at discharge.  Patient to Follow up at: Follow-up Information    [facility] Go on 11/13/2017.   Why:  Hospital follow up appointment is Monday, 11/18/17 at 2:30pm. Please be sure to bring your Photo ID, insurance information, and any discharge paperwork from this hospitalization. If you cannot make this appointment time, please call and reschedule.  Contact information: [address] [place] [address] [phone]           Next level of care provider has access to [facility] Link:yes  Safety Planning and Suicide Prevention discussed: Yes,  with the patient's daughter  Have you used any form of tobacco in the last 30 days? (Cigarettes, Smokeless Tobacco, Cigars, and/or Pipes): Yes  Has patient been referred to the Quitline?: Patient refused referral  Patient has been referred for addiction treatment: N/A  Maeola Sarah, LCSWA 11/13/2017, 11:05 AM
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
## 2017-11-13 NOTE — Progress Notes (Signed)
Recreation Therapy Notes  Date: 9.4.19 Time: 0930 Location: [address] Dayroom  Group Topic: Stress Management  Goal Area(s) Addresses:  Patient will verbalize importance of using healthy stress management.  Patient will identify positive emotions associated with healthy stress management.   Intervention: Stress Management  Activity :  Guided Imagery.  LRT introduced the stress management technique of guided imagery.  LRT read Estrada script that allowed patients to enjoy the summer clouds.  Patients were to follow along as LRT read script to engage in activity.  Education:  Stress Management, Discharge Planning.   Education Outcome: Acknowledges edcuation/In group clarification offered/Needs additional education  Clinical Observations/Feedback: Pt did not attend group.     Erica Estrada, LRT/CTRS         Erica Estrada, Erica Estrada 11/13/2017 11:01 AM

## 2017-11-13 NOTE — [facility] Group Notes (Signed)
[facility] Mental [facility] Association Group Therapy 11/13/2017 1:15pm  Type of Therapy: Mental Health Association Presentation  Participation Level: Active  Participation Quality: Attentive  Affect: Appropriate  Cognitive: Oriented  Insight: Developing/Improving  Engagement in Therapy: Engaged  Modes of Intervention: Discussion, Education and Socialization  Summary of Progress/Problems: Mental Health Association (MHA) Speaker came to talk about his personal journey with mental health. The pt processed ways by which to relate to the speaker. MHA speaker provided handouts and educational information pertaining to groups and services offered by the [facility]. Pt was engaged in speaker's presentation and was receptive to resources provided.    Rona Ravens, LCSW 11/13/2017 11:48 AM

## 2017-11-13 NOTE — Progress Notes (Signed)
Patient ID: Erica Estrada, female   DOB: 28-Jul-1950, 67 y.o.   MRN: [id number]   Patient discharged per MD orders. Patient given education regarding follow-up appointments and medications. Patient denies any questions or concerns about these instructions. Patient was escorted to locker and given belongings before discharge to hospital lobby. Patient currently denies SI/HI and auditory and visual hallucinations on discharge.
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
## 2017-11-25 ENCOUNTER — Emergency Department
Admission: EM | Admit: 2017-11-25 | Discharge: 2017-11-26 | Disposition: A | Discharge status: Home / Self Care | Payer: Medicare Other | Attending: Emergency Medicine | Admitting: Emergency Medicine

## 2017-11-25 ENCOUNTER — Emergency Department: Payer: Medicare Other

## 2017-11-25 ENCOUNTER — Other Ambulatory Visit: Payer: Self-pay

## 2017-11-25 DIAGNOSIS — I10 Essential (primary) hypertension: Secondary | ICD-10-CM | POA: Insufficient documentation

## 2017-11-25 DIAGNOSIS — Z794 Long term (current) use of insulin: Secondary | ICD-10-CM | POA: Insufficient documentation

## 2017-11-25 DIAGNOSIS — E119 Type 2 diabetes mellitus without complications: Secondary | ICD-10-CM | POA: Insufficient documentation

## 2017-11-25 DIAGNOSIS — R0789 Other chest pain: Secondary | ICD-10-CM | POA: Insufficient documentation

## 2017-11-25 DIAGNOSIS — Z79899 Other long term (current) drug therapy: Secondary | ICD-10-CM | POA: Insufficient documentation

## 2017-11-25 DIAGNOSIS — R110 Nausea: Secondary | ICD-10-CM | POA: Insufficient documentation

## 2017-11-25 DIAGNOSIS — F17210 Nicotine dependence, cigarettes, uncomplicated: Secondary | ICD-10-CM | POA: Diagnosis not present

## 2017-11-25 DIAGNOSIS — R51 Headache: Secondary | ICD-10-CM | POA: Insufficient documentation

## 2017-11-25 DIAGNOSIS — R519 Headache, unspecified: Secondary | ICD-10-CM

## 2017-11-25 LAB — CBC
HCT: 34.5 % — ABNORMAL LOW (ref 35.0–47.0)
HEMOGLOBIN: 11.4 g/dL — AB (ref 12.0–16.0)
MCH: 27.8 pg (ref 26.0–34.0)
MCHC: 33.2 g/dL (ref 32.0–36.0)
MCV: 83.7 fL (ref 80.0–100.0)
PLATELETS: 264 10*3/uL (ref 150–440)
RBC: 4.12 MIL/uL (ref 3.80–5.20)
RDW: 15.8 % — ABNORMAL HIGH (ref 11.5–14.5)
WBC: 15.3 10*3/uL — ABNORMAL HIGH (ref 3.6–11.0)

## 2017-11-25 LAB — TROPONIN I

## 2017-11-25 MED ORDER — KETOROLAC TROMETHAMINE 30 MG/ML IJ SOLN
30.0000 mg | Freq: Once | INTRAMUSCULAR | Status: AC
  Administered 2017-11-25: 30 mg via INTRAVENOUS

## 2017-11-25 MED ORDER — KETOROLAC TROMETHAMINE 30 MG/ML IJ SOLN
INTRAMUSCULAR | Status: AC
  Filled 2017-11-25: qty 1

## 2017-11-25 NOTE — ED Provider Notes (Signed)
[facility] Emergency Department Provider Note  ____________________________________________   None    (approximate)  I have reviewed the triage vital signs and the nursing notes.   HISTORY  Chief Complaint Chest Pain and Headache   HPI Erica Estrada is a 67 y.o. female who presents to the emergency department for treatment and evaluation of chest pain and headache. She states that they both started at the same time this afternoon. She also had some nausea and cold sweat but ate a cracker and the nausea went away. She took a nitroglycerin and chest pain lessened but headache got worse. She has been evaluated for this in the past and was told it was "gas."  Past Medical History:  Diagnosis Date  . Diabetes mellitus without complication (HCC)   . Hypertension     Patient Active Problem List   Diagnosis Date Noted  . MDD (major depressive disorder) 11/10/2017  . 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 Posttraumatic stress disorder     Past Surgical History:  Procedure Laterality Date  . ABDOMINAL HYSTERECTOMY    . EYE SURGERY      Prior to Admission medications   Medication Sig Start Date End Date Taking? Authorizing Provider  amLODipine (NORVASC) 10 MG tablet Take 1 tablet (10 mg total) by mouth daily. 11/13/17   Denzil Magnuson, NP  atorvastatin (LIPITOR) 80 MG tablet Take 1 tablet (80 mg total) by mouth daily. 11/13/17   Denzil Magnuson, NP  busPIRone (BUSPAR) 15 MG tablet Take 1 tablet (15 mg total) by mouth 2 (two) times daily. 11/13/17   Denzil Magnuson, NP  gabapentin (NEURONTIN) 100 MG capsule Take 1 capsule (100 mg total) by mouth at bedtime. 11/13/17   Denzil Magnuson, NP  hydrochlorothiazide (HYDRODIURIL) 25 MG tablet Take 1 tablet (25 mg total) by mouth daily. 11/14/17   Denzil Magnuson, NP  hydrOXYzine (ATARAX/VISTARIL) 25 MG tablet Take 1 tablet (25 mg total) by mouth 3 (three) times daily as  needed for anxiety. 11/13/17   Denzil Magnuson, NP  insulin glargine (LANTUS) 100 UNIT/ML injection Inject 0.5 mLs (50 Units total) into the skin at bedtime. 11/13/17   Denzil Magnuson, NP  insulin lispro (HUMALOG KWIKPEN) 100 UNIT/ML KiwkPen Inject 20 Units into the skin 3 (three) times daily. Plus sliding scale for up to 100 units TDD 04/18/16 04/18/17  [provider]  losartan (COZAAR) 100 MG tablet Take 1 tablet (100 mg total) by mouth daily. 11/14/17   Denzil Magnuson, NP  meloxicam (MOBIC) 7.5 MG tablet Take 1 tablet (7.5 mg total) by mouth 2 (two) times daily. 11/13/17   Denzil Magnuson, NP  metFORMIN (GLUCOPHAGE) 500 MG tablet Take 1 tablet (500 mg total) by mouth 2 (two) times daily with a meal. 11/13/17   Denzil Magnuson, NP  NOVOLOG FLEXPEN 100 UNIT/ML FlexPen Inject 20 Units into the skin 3 (three) times daily with meals. Plus sliding scale for up to 100 units TDD 03/22/17   [provider]  omeprazole (PRILOSEC) 40 MG capsule Take 1 capsule by mouth daily. 04/08/17   [provider]  PARoxetine (PAXIL) 40 MG tablet Take 1 tablet (40 mg total) by mouth every morning. 11/13/17   Denzil Magnuson, NP  SPIRIVA HANDIHALER 18 MCG inhalation capsule Place 1 capsule into inhaler and inhale daily. 03/22/17   [provider]  traZODone (DESYREL) 50 MG tablet Take 1 tablet (50 mg total) by mouth at bedtime as needed for sleep. 11/13/17  Denzil Magnusonhomas, [place], NP  VENTOLIN HFA 108 (90 Base) MCG/ACT inhaler Inhale 2 puffs into the lungs every 6 (six) hours as needed. 03/26/17   [provider]    Allergies Ace inhibitors and Fluticasone  History reviewed. No pertinent family history.  Social History Social History   Tobacco Use  . Smoking status: Current Every Day Smoker    Packs/day: 0.50  . Smokeless tobacco: Never Used  Substance Use Topics  . Alcohol use: No    Frequency: Never  . Drug use: No    Review of Systems  Constitutional: No fever/chills Eyes:  No visual changes. ENT: No sore throat. Cardiovascular: Positive for chest pain. Respiratory: Denies shortness of breath. Gastrointestinal: No abdominal pain.  No nausea, no vomiting.  No diarrhea.  No constipation. Genitourinary: Negative for dysuria. Musculoskeletal: Negative for back pain. Skin: Negative for rash. Neurological: Negative for headaches, focal weakness or numbness. ____________________________________________   PHYSICAL EXAM:  VITAL SIGNS: ED Triage Vitals  Enc Vitals Group     BP 11/25/17 2137 128/70     Pulse Rate 11/25/17 2137 92     Resp 11/25/17 2137 18     Temp 11/25/17 2137 98.5 F (36.9 C)     Temp Source 11/25/17 2137 Oral     SpO2 11/25/17 2137 98 %     Weight 11/25/17 2138 173 lb (78.5 kg)     Height 11/25/17 2138 5\' 5"  (1.651 m)     Head Circumference --      Peak Flow --      Pain Score 11/25/17 2138 3     Pain Loc --      Pain Edu? --      Excl. in GC? --     Constitutional: Alert and oriented. Well appearing and in no acute distress. Eyes: Conjunctivae are normal.  Head: Atraumatic. Nose: No congestion/rhinnorhea. Mouth/Throat: Mucous membranes are moist.  Oropharynx non-erythematous. Neck: No stridor.   Cardiovascular: Normal rate, regular rhythm. Grossly normal heart sounds.  Good peripheral circulation. Respiratory: Normal respiratory effort.  No retractions. Lungs CTAB. Gastrointestinal: Soft and nontender. No distention. No abdominal bruits. No CVA tenderness. Musculoskeletal: No lower extremity tenderness nor edema.  No joint effusions. Neurologic:  Normal speech and language. No gross focal neurologic deficits are appreciated. No gait instability. Skin:  Skin is warm, dry and intact. No rash noted. Psychiatric: Mood and affect are normal. Speech and behavior are normal.  ____________________________________________   LABS (all labs ordered are listed, but only abnormal results are displayed)  Labs Reviewed  CBC - Abnormal;  Notable for the following components:      Result Value   WBC 15.3 (*)    Hemoglobin 11.4 (*)    HCT 34.5 (*)    RDW 15.8 (*)    All other components within normal limits  BASIC METABOLIC PANEL - Abnormal; Notable for the following components:   Potassium 3.1 (*)    Glucose, Bld 154 (*)    All other components within normal limits  TROPONIN I  TROPONIN I   ____________________________________________  EKG  Sinus rhythm, rate 90, normal axis, no ST elevation. ____________________________________________  RADIOLOGY  ED MD interpretation:  Chest x-ray negative for acute cardiopulmonary abnormality.  Official radiology report(s): Dg Chest 2 View  Result Date: 11/25/2017 CLINICAL DATA:  Chest pain and headache throughout the day. History of smoker, hypertension, diabetes. EXAM: CHEST - 2 VIEW COMPARISON:  11/09/2017 FINDINGS: Normal heart size and pulmonary vascularity. No focal airspace disease or  consolidation in the lungs. No blunting of costophrenic angles. No pneumothorax. Mediastinal contours appear intact. Calcification of the aorta. IMPRESSION: No active cardiopulmonary disease. Electronically Signed   By: Burman Nieves M.D.   On: 11/25/2017 22:18    ____________________________________________   PROCEDURES  Procedure(s) performed: None  Procedures  Critical Care performed: No  ____________________________________________   INITIAL IMPRESSION / ASSESSMENT AND PLAN / ED COURSE  As part of my medical decision making, I reviewed the following data within the electronic MEDICAL RECORD NUMBER Notes from prior ED visits   67 year old female presenting to the emergency department for treatment and evaluation of chest pain and headache. She has no past cardiac history.  ----------------------------------------- 1:48 AM on 11/26/2017 -----------------------------------------  Patient is currently chest pain free. Serial troponins are normal. She is to follow up with  cardiology as an outpatient. She is to return to the ER for symptoms that change or worsen if unable to see her PCP or cardiology.      ____________________________________________   FINAL CLINICAL IMPRESSION(S) / ED DIAGNOSES  Final diagnoses:  Atypical chest pain  Acute intractable headache, unspecified headache type     ED Discharge Orders    None       Note:  This document was prepared using Dragon voice recognition software and may include unintentional dictation errors.    Chinita Pester, FNP 11/26/17 0214    Minna Antis, MD 12/02/17 [phone]

## 2017-11-25 NOTE — ED Provider Notes (Signed)
-----------------------------------------   11:28 PM on 11/25/2017 -----------------------------------------  Patient seen in conjunction with nurse practitioner Triplett.  Overall patient appears well, negative cardiac enzymes, normal chest x-ray.  Denies any chest pain at this time her only complaint is moderate headache.  States she has a history of headaches but it has been a long time since she has had one this bad.  The only thing she has tried at home is a aspirin powder, without significant relief.  We will dose headache medication for the patient continue to closely monitor.  If the patient appears well with an otherwise normal work-up anticipate likely discharge home.  Patient agreeable to plan of care  EKG reviewed and interpreted by myself shows sinus rhythm at 90 bpm with a narrow QRS, normal axis, normal intervals, no concerning ST changes.   Minna AntisPaduchowski, Mikhail Hallenbeck, MD 11/25/17 2329

## 2017-11-25 NOTE — ED Triage Notes (Addendum)
Pt arrives via ems from home. Ems states pt has been experiencing chest pain and headache throughout the day. Ems report VS WNS. NAD at this time. EMS states pt took one SL nitro prior to their arrival

## 2017-11-26 LAB — BASIC METABOLIC PANEL
Anion gap: 8 (ref 5–15)
BUN: 11 mg/dL (ref 8–23)
CHLORIDE: 102 mmol/L (ref 98–111)
CO2: 28 mmol/L (ref 22–32)
Calcium: 9.1 mg/dL (ref 8.9–10.3)
Creatinine, Ser: 0.93 mg/dL (ref 0.44–1.00)
GFR calc Af Amer: >60 mL/min (ref >60)
GFR calc non Af Amer: >60 mL/min (ref >60)
Glucose, Bld: 154 mg/dL — ABNORMAL HIGH (ref 70–99)
POTASSIUM: 3.1 mmol/L — AB (ref 3.5–5.1)
SODIUM: 138 mmol/L (ref 135–145)

## 2017-11-26 LAB — TROPONIN I

## 2017-11-26 NOTE — Discharge Instructions (Signed)
Take an 81mg  aspirin daily. Call and schedule an appointment with Cardiology. Return to the ER for symptoms that change or worsen if unable to see your primary care provider or cardiology.

## 2017-11-26 NOTE — ED Notes (Signed)
Pt resting quietly.

## 2018-01-09 ENCOUNTER — Emergency Department: Payer: Medicare Other

## 2018-01-09 ENCOUNTER — Other Ambulatory Visit: Payer: Self-pay

## 2018-01-09 ENCOUNTER — Emergency Department
Admission: EM | Admit: 2018-01-09 | Discharge: 2018-01-09 | Disposition: A | Discharge status: Home / Self Care | Payer: Medicare Other | Attending: Student in an Organized Health Care Education/Training Program | Admitting: Student in an Organized Health Care Education/Training Program

## 2018-01-09 ENCOUNTER — Encounter: Payer: Self-pay | Admitting: Emergency Medicine

## 2018-01-09 DIAGNOSIS — Z794 Long term (current) use of insulin: Secondary | ICD-10-CM | POA: Insufficient documentation

## 2018-01-09 DIAGNOSIS — Z79899 Other long term (current) drug therapy: Secondary | ICD-10-CM | POA: Diagnosis not present

## 2018-01-09 DIAGNOSIS — I10 Essential (primary) hypertension: Secondary | ICD-10-CM | POA: Insufficient documentation

## 2018-01-09 DIAGNOSIS — E119 Type 2 diabetes mellitus without complications: Secondary | ICD-10-CM | POA: Diagnosis not present

## 2018-01-09 DIAGNOSIS — F17210 Nicotine dependence, cigarettes, uncomplicated: Secondary | ICD-10-CM | POA: Diagnosis not present

## 2018-01-09 DIAGNOSIS — K5641 Fecal impaction: Secondary | ICD-10-CM | POA: Diagnosis not present

## 2018-01-09 DIAGNOSIS — K5900 Constipation, unspecified: Secondary | ICD-10-CM | POA: Diagnosis present

## 2018-01-09 LAB — CBC
HCT: 44.2 % (ref 36.0–46.0)
Hemoglobin: 13.7 g/dL (ref 12.0–15.0)
MCH: 26.3 pg (ref 26.0–34.0)
MCHC: 31 g/dL (ref 30.0–36.0)
MCV: 84.8 fL (ref 80.0–100.0)
NRBC: 0 % (ref 0.0–0.2)
PLATELETS: 376 10*3/uL (ref 150–400)
RBC: 5.21 MIL/uL — AB (ref 3.87–5.11)
RDW: 14 % (ref 11.5–15.5)
WBC: 12 10*3/uL — ABNORMAL HIGH (ref 4.0–10.5)

## 2018-01-09 LAB — COMPREHENSIVE METABOLIC PANEL
ALK PHOS: 133 U/L — AB (ref 38–126)
ALT: 10 U/L (ref 0–44)
ANION GAP: 10 (ref 5–15)
AST: 25 U/L (ref 15–41)
Albumin: 4.1 g/dL (ref 3.5–5.0)
BILIRUBIN TOTAL: 0.6 mg/dL (ref 0.3–1.2)
BUN: 18 mg/dL (ref 8–23)
CALCIUM: 9.7 mg/dL (ref 8.9–10.3)
CO2: 25 mmol/L (ref 22–32)
Chloride: 105 mmol/L (ref 98–111)
Creatinine, Ser: 0.98 mg/dL (ref 0.44–1.00)
GFR calc Af Amer: >60 mL/min (ref >60)
GFR calc non Af Amer: 58 mL/min — ABNORMAL LOW (ref >60)
Glucose, Bld: 148 mg/dL — ABNORMAL HIGH (ref 70–99)
POTASSIUM: 4.1 mmol/L (ref 3.5–5.1)
SODIUM: 140 mmol/L (ref 135–145)
TOTAL PROTEIN: 8.9 g/dL — AB (ref 6.5–8.1)

## 2018-01-09 LAB — LIPASE, BLOOD: Lipase: 32 U/L (ref 11–51)

## 2018-01-09 MED ORDER — LACTULOSE 10 G PO PACK: 10.0000 g | PACK | Freq: Three times a day (TID) | ORAL | 0 refills | Status: DC

## 2018-01-09 MED ORDER — LIDOCAINE HCL URETHRAL/MUCOSAL 2 % EX GEL
1.0000 "application " | Freq: Once | CUTANEOUS | Status: AC
  Administered 2018-01-09: 1

## 2018-01-09 MED ORDER — LIDOCAINE HCL URETHRAL/MUCOSAL 2 % EX GEL
CUTANEOUS | Status: AC
  Administered 2018-01-09: 1
  Filled 2018-01-09: qty 10

## 2018-01-09 MED ORDER — ACETAMINOPHEN 500 MG PO TABS
1000.0000 mg | ORAL_TABLET | Freq: Once | ORAL | Status: AC
  Administered 2018-01-09: 1000 mg via ORAL
  Filled 2018-01-09: qty 2

## 2018-01-09 MED ORDER — MAGNESIUM CITRATE PO SOLN
1.0000 | Freq: Once | ORAL | Status: AC
  Administered 2018-01-09: 1 via ORAL
  Filled 2018-01-09: qty 296

## 2018-01-09 MED ORDER — MORPHINE SULFATE (PF) 4 MG/ML IV SOLN
4.0000 mg | INTRAVENOUS | Status: DC | PRN
  Administered 2018-01-09: 4 mg via INTRAVENOUS
  Filled 2018-01-09: qty 1

## 2018-01-09 MED ORDER — LIDOCAINE HCL URETHRAL/MUCOSAL 2 % EX GEL
1.0000 "application " | Freq: Once | CUTANEOUS | Status: AC
  Administered 2018-01-09: 1 via URETHRAL
  Filled 2018-01-09: qty 10

## 2018-01-09 MED ORDER — METOCLOPRAMIDE HCL 5 MG/ML IJ SOLN
10.0000 mg | Freq: Once | INTRAMUSCULAR | Status: AC
  Administered 2018-01-09: 10 mg via INTRAVENOUS
  Filled 2018-01-09: qty 2

## 2018-01-09 MED ORDER — GLYCERIN (LAXATIVE) 2.1 G RE SUPP
1.0000 | Freq: Once | RECTAL | Status: AC
  Administered 2018-01-09: 1 via RECTAL
  Filled 2018-01-09 (×2): qty 1

## 2018-01-09 NOTE — ED Notes (Signed)
Pt offered crackers and given pain medication. Pt also informed of needing to consume the mag citrate.

## 2018-01-09 NOTE — ED Notes (Signed)
Report given to Alivia RN 

## 2018-01-09 NOTE — ED Notes (Signed)
Nurse in to assist Dr. Roxan Hockey with removal of stool. Pt is laying on left side

## 2018-01-09 NOTE — ED Notes (Signed)
Pt tolerated well. About six medium balls of stool were removed from pt's rectum and Glycerin was given. Pt stating pressure is gone at this time.

## 2018-01-09 NOTE — ED Triage Notes (Signed)
Patient reports constipation for 5-6 days with no relief from OTC medications. Denies N/V. Reports pain in abdomen and rectum.

## 2018-01-09 NOTE — ED Provider Notes (Signed)
[facility] Emergency Department Provider Note    First MD Initiated Contact with Patient 01/09/18 1246     (approximate)  I have reviewed the triage vital signs and the nursing notes.   HISTORY  Chief Complaint Constipation    HPI Erica Estrada is a 67 y.o. female history of diabetes hypertension presents to the  for constipation and inability to move her bowels for the past several days.  Denies any history of constipation.  Does feel bloated.  No nausea or vomiting.  No fevers.  Has not tried anything to help at home.   Past Medical History:  Diagnosis Date  . Diabetes mellitus without complication (HCC)   . Hypertension    No family history on file. Past Surgical History:  Procedure Laterality Date  . ABDOMINAL HYSTERECTOMY    . EYE SURGERY     Patient Active Problem List   Diagnosis Date Noted  . MDD (major depressive disorder) 11/10/2017  . 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 Posttraumatic stress disorder       Prior to Admission medications   Medication Sig Start Date End Date Taking? Authorizing Provider  amLODipine (NORVASC) 10 MG tablet Take 1 tablet (10 mg total) by mouth daily. 11/13/17   Denzil Magnuson, NP  atorvastatin (LIPITOR) 80 MG tablet Take 1 tablet (80 mg total) by mouth daily. 11/13/17   Denzil Magnuson, NP  busPIRone (BUSPAR) 15 MG tablet Take 1 tablet (15 mg total) by mouth 2 (two) times daily. 11/13/17   Denzil Magnuson, NP  gabapentin (NEURONTIN) 100 MG capsule Take 1 capsule (100 mg total) by mouth at bedtime. 11/13/17   Denzil Magnuson, NP  hydrochlorothiazide (HYDRODIURIL) 25 MG tablet Take 1 tablet (25 mg total) by mouth daily. 11/14/17   Denzil Magnuson, NP  hydrOXYzine (ATARAX/VISTARIL) 25 MG tablet Take 1 tablet (25 mg total) by mouth 3 (three) times daily as needed for anxiety. 11/13/17   Denzil Magnuson, NP  insulin glargine (LANTUS) 100 UNIT/ML injection Inject 0.5  mLs (50 Units total) into the skin at bedtime. 11/13/17   Denzil Magnuson, NP  insulin lispro (HUMALOG KWIKPEN) 100 UNIT/ML KiwkPen Inject 20 Units into the skin 3 (three) times daily. Plus sliding scale for up to 100 units TDD 04/18/16 04/18/17  [provider]  lactulose (CEPHULAC) 10 g packet Take 1 packet (10 g total) by mouth 3 (three) times daily. 01/09/18   Willy Eddy, MD  losartan (COZAAR) 100 MG tablet Take 1 tablet (100 mg total) by mouth daily. 11/14/17   Denzil Magnuson, NP  meloxicam (MOBIC) 7.5 MG tablet Take 1 tablet (7.5 mg total) by mouth 2 (two) times daily. 11/13/17   Denzil Magnuson, NP  metFORMIN (GLUCOPHAGE) 500 MG tablet Take 1 tablet (500 mg total) by mouth 2 (two) times daily with a meal. 11/13/17   Denzil Magnuson, NP  NOVOLOG FLEXPEN 100 UNIT/ML FlexPen Inject 20 Units into the skin 3 (three) times daily with meals. Plus sliding scale for up to 100 units TDD 03/22/17   [provider]  omeprazole (PRILOSEC) 40 MG capsule Take 1 capsule by mouth daily. 04/08/17   [provider]  PARoxetine (PAXIL) 40 MG tablet Take 1 tablet (40 mg total) by mouth every morning. 11/13/17   Denzil Magnuson, NP  SPIRIVA HANDIHALER 18 MCG inhalation capsule Place 1 capsule into inhaler and inhale daily. 03/22/17   [provider]  traZODone (DESYREL) 50 MG tablet Take 1 tablet (50 mg  total) by mouth at bedtime as needed for sleep. 11/13/17   Denzil Magnuson, NP  VENTOLIN HFA 108 (90 Base) MCG/ACT inhaler Inhale 2 puffs into the lungs every 6 (six) hours as needed. 03/26/17   [provider]    Allergies Ace inhibitors and Fluticasone    Social History Social History   Tobacco Use  . Smoking status: Current Every Day Smoker    Packs/day: 0.50  . Smokeless tobacco: Never Used  Substance Use Topics  . Alcohol use: No    Frequency: Never  . Drug use: No    Review of Systems Patient denies headaches, rhinorrhea, blurry vision, numbness,  shortness of breath, chest pain, edema, cough, abdominal pain, nausea, vomiting, diarrhea, dysuria, fevers, rashes or hallucinations unless otherwise stated above in HPI. ____________________________________________   PHYSICAL EXAM:  VITAL SIGNS: Vitals:   01/09/18 1430 01/09/18 1500  BP: 116/83 (!) 135/95  Pulse: 81 82  Resp:  15  Temp:    SpO2: 100% 99%    Constitutional: Alert and oriented.  Eyes: Conjunctivae are normal.  Head: Atraumatic. Nose: No congestion/rhinnorhea. Mouth/Throat: Mucous membranes are moist.   Neck: No stridor. Painless ROM.  Cardiovascular: Normal rate, regular rhythm. Grossly normal heart sounds.  Good peripheral circulation. Respiratory: Normal respiratory effort.  No retractions. Lungs CTAB. Gastrointestinal: Soft and nontender. No distention. No abdominal bruits. No CVA tenderness. Genitourinary: Large firm stool ball in the rectal vault.  No masses.  No blood on DRE Musculoskeletal: No lower extremity tenderness nor edema.  No joint effusions. Neurologic:  Normal speech and language. No gross focal neurologic deficits are appreciated. No facial droop Skin:  Skin is warm, dry and intact. No rash noted. Psychiatric: Mood and affect are normal. Speech and behavior are normal.  ____________________________________________   LABS (all labs ordered are listed, but only abnormal results are displayed)  Results for orders placed or performed during the hospital encounter of 01/09/18 (from the past 24 hour(s))  Lipase, blood     Status: None   Collection Time: 01/09/18 12:27 PM  Result Value Ref Range   Lipase 32 11 - 51 U/L  Comprehensive metabolic panel     Status: Abnormal   Collection Time: 01/09/18 12:27 PM  Result Value Ref Range   Sodium 140 135 - 145 mmol/L   Potassium 4.1 3.5 - 5.1 mmol/L   Chloride 105 98 - 111 mmol/L   CO2 25 22 - 32 mmol/L   Glucose, Bld 148 (H) 70 - 99 mg/dL   BUN 18 8 - 23 mg/dL   Creatinine, Ser 1.61 0.44 - 1.00  mg/dL   Calcium 9.7 8.9 - 09.6 mg/dL   Total Protein 8.9 (H) 6.5 - 8.1 g/dL   Albumin 4.1 3.5 - 5.0 g/dL   AST 25 15 - 41 U/L   ALT 10 0 - 44 U/L   Alkaline Phosphatase 133 (H) 38 - 126 U/L   Total Bilirubin 0.6 0.3 - 1.2 mg/dL   GFR calc non Af Amer 58 (L) >60 mL/min   GFR calc Af Amer >60 >60 mL/min   Anion gap 10 5 - 15  CBC     Status: Abnormal   Collection Time: 01/09/18 12:27 PM  Result Value Ref Range   WBC 12.0 (H) 4.0 - 10.5 K/uL   RBC 5.21 (H) 3.87 - 5.11 MIL/uL   Hemoglobin 13.7 12.0 - 15.0 g/dL   HCT 04.5 40.9 - 81.1 %   MCV 84.8 80.0 - 100.0 fL  MCH 26.3 26.0 - 34.0 pg   MCHC 31.0 30.0 - 36.0 g/dL   RDW 16.1 09.6 - 04.5 %   Platelets 376 150 - 400 K/uL   nRBC 0.0 0.0 - 0.2 %   ____________________________________________ ____________________________________________  RADIOLOGY  I personally reviewed all radiographic images ordered to evaluate for the above acute complaints and reviewed radiology reports and findings.  These findings were personally discussed with the patient.  Please see medical record for radiology report.  ____________________________________________   PROCEDURES  Procedure(s) performed:  Procedures  ------------------------------------------------------------------------------------------------------------------- Fecal Disimpaction Procedure Note:  Performed by me:  Patient placed in the lateral recumbent position with knees drawn towards chest. Nurse present for patient support. Large amount of hard brown stool removed. No complications during procedure.   ------------------------------------------------------------------------------------------------------------------    Critical Care performed: no ____________________________________________   INITIAL IMPRESSION / ASSESSMENT AND PLAN / ED COURSE  Pertinent labs & imaging results that were available during my care of the patient were reviewed by me and considered in my  medical decision making (see chart for details).   DDX: Fecal impaction, mass, constipation, obstipation, SBO  Erica Estrada is a 67 y.o. who presents to the ED with symptoms as described above.  High suspicion for fecal impaction.  Blood work sent for the above differential does show mild leukocytosis likely secondary to pain possible stercoral colitis.  Remainder of abdominal exam is benign.  We will plan disimpaction.  Clinical Course as of Jan 09 1606  Thu Jan 09, 2018  1350 ------------------------------------------------------------------------------------------------------------------- Fecal Disimpaction Procedure Note:  Performed by me:  Patient placed in the lateral recumbent position with knees drawn towards chest. Nurse present for patient support. Large amount of hard brown stool removed. No complications during procedure.   ------------------------------------------------------------------------------------------------------------------     [PR]  1416 Called emergently to bedside.  Patient had vasovagal episode while sitting on commode receiving enema.  Protecting her airway.  Placed back in stretcher with improvement in symptoms.  We will continue to observe patient.   [PR]  1435 Based the patient's age however will order CT imaging to exclude other intra-abdominal process that could be concomitantly present with constipation fecal impaction.  Vital signs remained stable.   [PR]  1553 Patient with recurrent fecal impaction.  Will repeat disimpaction and discharge patient home on oral lactulose.   [PR]    Clinical Course User Index [PR] Willy Eddy, MD    ----------------------------------------- 4:07 PM on 01/09/2018 -----------------------------------------  Patient tolerated repeat disimpaction now with only soft formed stool.  Glycerin suppository placed.  Have discussed with the patient and available family all diagnostics and treatments performed thus  far and all questions were answered to the best of my ability. The patient demonstrates understanding and agreement with plan.    As part of my medical decision making, I reviewed the following data within the electronic MEDICAL RECORD NUMBER Nursing notes reviewed and incorporated, Labs reviewed, notes from prior ED visits and [place] Controlled Substance Database   ____________________________________________   FINAL CLINICAL IMPRESSION(S) / ED DIAGNOSES  Final diagnoses:  Fecal impaction (HCC)      NEW MEDICATIONS STARTED DURING THIS VISIT:  New Prescriptions   LACTULOSE (CEPHULAC) 10 G PACKET    Take 1 packet (10 g total) by mouth 3 (three) times daily.     Note:  This document was prepared using Dragon voice recognition software and may include unintentional dictation errors.    Willy Eddy, MD 01/09/18 [phone]

## 2018-01-09 NOTE — ED Notes (Signed)
Pt was able to have large BM on toilet. Pt became little dizzy while on toilet. Pt assisted back into bed by RN Annette Stable, Student techs Shawna Orleans and Natalia Leatherwood. Pt resting comfortably in bed at this time.  MD aware

## 2018-01-09 NOTE — ED Notes (Signed)
Pt back from CT

## 2018-01-09 NOTE — ED Notes (Signed)
Patient transported to CT 

## 2018-03-23 ENCOUNTER — Encounter: Payer: Self-pay | Admitting: Emergency Medicine

## 2018-03-23 ENCOUNTER — Emergency Department
Admission: EM | Admit: 2018-03-23 | Discharge: 2018-03-23 | Disposition: A | Discharge status: Home / Self Care | Payer: Medicare Other | Attending: Emergency Medicine | Admitting: Emergency Medicine

## 2018-03-23 ENCOUNTER — Other Ambulatory Visit: Payer: Self-pay

## 2018-03-23 DIAGNOSIS — N898 Other specified noninflammatory disorders of vagina: Secondary | ICD-10-CM

## 2018-03-23 DIAGNOSIS — I10 Essential (primary) hypertension: Secondary | ICD-10-CM | POA: Diagnosis not present

## 2018-03-23 DIAGNOSIS — E119 Type 2 diabetes mellitus without complications: Secondary | ICD-10-CM | POA: Diagnosis not present

## 2018-03-23 DIAGNOSIS — R102 Pelvic and perineal pain: Secondary | ICD-10-CM | POA: Diagnosis not present

## 2018-03-23 DIAGNOSIS — F17210 Nicotine dependence, cigarettes, uncomplicated: Secondary | ICD-10-CM | POA: Diagnosis not present

## 2018-03-23 DIAGNOSIS — R81 Glycosuria: Secondary | ICD-10-CM | POA: Diagnosis not present

## 2018-03-23 DIAGNOSIS — N939 Abnormal uterine and vaginal bleeding, unspecified: Secondary | ICD-10-CM | POA: Diagnosis present

## 2018-03-23 LAB — URINALYSIS, COMPLETE (UACMP) WITH MICROSCOPIC
Bacteria, UA: NONE SEEN
Bilirubin Urine: NEGATIVE
HGB URINE DIPSTICK: NEGATIVE
KETONES UR: NEGATIVE mg/dL
Leukocytes, UA: NEGATIVE
NITRITE: NEGATIVE
PROTEIN: NEGATIVE mg/dL
Specific Gravity, Urine: 1.031 — ABNORMAL HIGH (ref 1.005–1.030)
pH: 5 (ref 5.0–8.0)

## 2018-03-23 LAB — GLUCOSE, CAPILLARY: Glucose-Capillary: 114 mg/dL — ABNORMAL HIGH (ref 70–99)

## 2018-03-23 LAB — CHLAMYDIA/NGC RT PCR (ARMC ONLY)
Chlamydia Tr: NOT DETECTED
N gonorrhoeae: NOT DETECTED

## 2018-03-23 LAB — WET PREP, GENITAL
Clue Cells Wet Prep HPF POC: NONE SEEN
Sperm: NONE SEEN
Trich, Wet Prep: NONE SEEN
Yeast Wet Prep HPF POC: NONE SEEN

## 2018-03-23 MED ORDER — ACETAMINOPHEN 500 MG PO TABS: 1000.0000 mg | ORAL_TABLET | Freq: Once | ORAL | Status: DC

## 2018-03-23 MED ORDER — CLOTRIMAZOLE 1 % VA CREA: 1.0000 | TOPICAL_CREAM | Freq: Every day | VAGINAL | 0 refills | Status: AC

## 2018-03-23 NOTE — ED Triage Notes (Signed)
Vaginal itching / pain / small amount of discharge. x1 month , comes and goes

## 2018-03-23 NOTE — ED Notes (Signed)
Pelvic cart to bedside, pt instructed to remove pants at this time. Dr. Sharma Covert aware pt is ready.

## 2018-03-23 NOTE — Discharge Instructions (Signed)
Today, your test did not show evidence of any infection in your vagina, including a yeast infection.  However, you may feel improvement with clotrimazole cream, which you can apply inside the vagina and around the vulva to decrease itching.  Please make a follow-up appointment with a gynecologist for further evaluation, as there are other conditions that can cause vaginal itching.  Please make an appoint with your primary care physician for reevaluation of your diabetes, as you do have blood sugar that is spilling into your urine.  Turn to the emergency department if you develop severe pain, lightheadedness or fainting, fever, inability keep down fluids, or any other symptoms concerning to you.

## 2018-03-23 NOTE — ED Provider Notes (Signed)
[facility] Emergency Department Provider Note  ____________________________________________  Time seen: Approximately 8:21 AM  I have reviewed the triage vital signs and the nursing notes.   HISTORY  Chief Complaint Vaginal Bleeding    HPI Erica Estrada is a 68 y.o. female diabetic, status post hysterectomy, but no longer sexually active, presenting with 1 month of intermittent vaginal itching, discomfort, and white discharge.  The patient reports that her blood sugars have been running in the low 100s.  She has not had any fevers or chills, abdominal pain.  She does have pain with urination.  She has not tried anything for her symptoms.    Past Medical History:  Diagnosis Date  . Diabetes mellitus without complication (HCC)   . Hypertension     Patient Active Problem List   Diagnosis Date Noted  . MDD (major depressive disorder) 11/10/2017  . 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 Posttraumatic stress disorder     Past Surgical History:  Procedure Laterality Date  . ABDOMINAL HYSTERECTOMY    . EYE SURGERY      Current Outpatient Rx  . Order #: [id number] Class: Print  . Order #: [id number] Class: Print  . Order #: [id number] Class: Print  . Order #: [id number] Class: Print  . Order #: [id number] Class: Print  . Order #: [id number] Class: Print  . Order #: [id number] Class: Print  . Order #: [id number] Class: Print  . Order #: [id number] Class: Historical Med  . Order #: [id number] Class: Print  . Order #: [id number] Class: Print  . Order #: [id number] Class: Print  . Order #: [id number] Class: Print  . Order #: [id number] Class: Historical Med  . Order #: [id number] Class: Historical Med  . Order #: [id number] Class: Print  . Order #: [id number] Class: Historical Med  . Order #: [id number] Class: Print  . Order #: [id number] Class: Historical Med    Allergies Ace inhibitors and Fluticasone  No family history on  file.  Social History Social History   Tobacco Use  . Smoking status: Current Every Day Smoker    Packs/day: 0.50  . Smokeless tobacco: Never Used  Substance Use Topics  . Alcohol use: No    Frequency: Never  . Drug use: No    Review of Systems Constitutional: No fever/chills.  No lightheadedness or syncope. Eyes: No visual changes. ENT: No sore throat. No congestion or rhinorrhea. Cardiovascular: Denies chest pain. Denies palpitations. Respiratory: Denies shortness of breath.  No cough. Gastrointestinal: No abdominal pain.  No nausea, no vomiting.  No diarrhea.  No constipation. Genitourinary: Positive for dysuria without urinary frequency or hematuria.  Positive for vaginal itching, pain, and increased white discharge. Musculoskeletal: Negative for back pain. Skin: Negative for rash. Neurological: Negative for headaches. No focal numbness, tingling or weakness.     ____________________________________________   PHYSICAL EXAM:  VITAL SIGNS: ED Triage Vitals [03/23/18 0730]  Enc Vitals Group     BP (!) 162/79     Pulse Rate 82     Resp 18     Temp 97.9 F (36.6 C)     Temp Source Oral     SpO2 100 %     Weight 170 lb (77.1 kg)     Height 5\' 5"  (1.651 m)     Head Circumference      Peak Flow      Pain Score 10     Pain Loc      Pain Edu?      Excl. in GC?     Constitutional: Alert and  oriented. Answers questions appropriately. Eyes: Conjunctivae are normal.  EOMI. No scleral icterus. Head: Atraumatic. Nose: No congestion/rhinnorhea. Mouth/Throat: Mucous membranes are moist.  Neck: No stridor.  Supple.   Cardiovascular: Normal rate Respiratory: Normal respiratory effort.   Gastrointestinal: Soft, nontender and nondistended.  No guarding or rebound.  No peritoneal signs. Genitourinary: Normal-appearing external genitalia without lesions or significant erythema or excoriations. Vaginal exam with moderate white thick discharge, the patient has a vaginal  cough; I do not see a cervix;, normal vaginal wall tissue. Bimanual exam is negative for CMT, adnexal tenderness to palpation, no palpable masses.   Musculoskeletal: No LE edema. No ttp in the calves or palpable cords.  Negative Homan's sign. Neurologic:  A&Ox3.  Speech is clear.  Face and smile are symmetric.  EOMI.  Moves all extremities well. Skin:  Skin is warm, dry and intact. No rash noted. Psychiatric: Mood and affect are normal. Speech and behavior are normal.  Normal judgement.  ____________________________________________   LABS (all labs ordered are listed, but only abnormal results are displayed)  Labs Reviewed  WET PREP, GENITAL - Abnormal; Notable for the following components:      Result Value   WBC, Wet Prep HPF POC FEW (*)    All other components within normal limits  URINALYSIS, COMPLETE (UACMP) WITH MICROSCOPIC - Abnormal; Notable for the following components:   Color, Urine YELLOW (*)    APPearance CLEAR (*)    Specific Gravity, Urine 1.031 (*)    Glucose, UA >=500 (*)    All other components within normal limits  CHLAMYDIA/NGC RT PCR ([facility] ONLY)  CBG MONITORING, ED   ____________________________________________  EKG  Not indicated ____________________________________________  RADIOLOGY  No results found.  ____________________________________________   PROCEDURES  Procedure(s) performed: None  Procedures  Critical Care performed: No ____________________________________________   INITIAL IMPRESSION / ASSESSMENT AND PLAN / ED COURSE  Pertinent labs & imaging results that were available during my care of the patient were reviewed by me and considered in my medical decision making (see chart for details).  68 y.o. female, diabetic, presenting with 1 month of intermittent vaginal discharge.  Overall, given the patient's diabetes, I am most concerned about a candidal infection.  A pelvic examination has been performed, and samples have  been sent to the lab, including gonorrhea and chlamydia although the patient states she is not sexually active.  We also get a UA to rule out UTI.  Patient will be given Tylenol for her symptoms.  Plan reevaluation for final disposition.  ----------------------------------------- 10:14 AM on 03/23/2018 -----------------------------------------  The patient's pelvic studies show a few white blood cells but otherwise no evidence of yeast or other infection including gonorrhea or chlamydia.  However, given her history of diabetes and itching, I will give her a 7-day course of clotrimazole to see if this helps with her symptoms.  Other etiologies that are less common, including skin disorder such as lichen sclerosis, are still on the differential and I have encouraged the patient to follow-up with gynecology if she has persistent symptoms.  She does have glucosuria in her urine so we will get a CBG; I have let the patient know about this abnormality and asked her to follow-up with her primary care physician about her diabetes medications.  At this time, the patient continues to be hemodynamically stable and will discharge her safely from the emergency department.  Follow-up instructions as well as return precautions were discussed. ____________________________________________  FINAL CLINICAL IMPRESSION(S) / ED DIAGNOSES  Final diagnoses:  Glucosuria  Vaginal itching  Vaginal pain  Vaginal discharge         NEW MEDICATIONS STARTED DURING THIS VISIT:  New Prescriptions   CLOTRIMAZOLE (GYNE-LOTRIMIN) 1 % VAGINAL CREAM    Place 1 Applicatorful vaginally at bedtime for 7 days. You may also put some cream on your vulva as needed for itching.      Rockne Menghini, MD 03/23/18 1015

## 2018-07-23 IMAGING — CT CT ABD-PELV W/ CM
2 of 5 series · 16 of 46 positions shown, 18 images · IV contrast (iopamidol)
Comparison: None.

CLINICAL DATA: Presents from local restaurant with nausea. Similar
episode 3 days ago. History of hypertension and diabetes.

EXAM:
CT ABDOMEN AND PELVIS WITH CONTRAST
TECHNIQUE: Multidetector CT imaging of the abdomen and pelvis was performed
using the standard protocol following bolus administration of
intravenous contrast.
CONTRAST:  100mL HEUXU8-NJJ IOPAMIDOL (HEUXU8-NJJ) INJECTION 61%

[Series 2: routine abd/pel with · axial · 0.85mm/px · z∈[-1042,-617]mm · 13 of 97 slices shown, 15 images]
[im 6/97  soft-tissue]
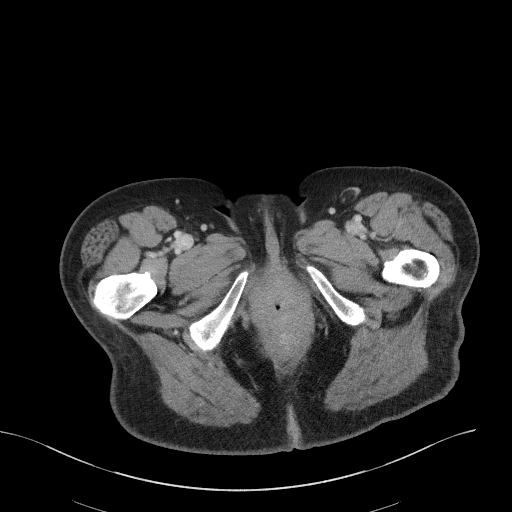
[im 6/97  bone]
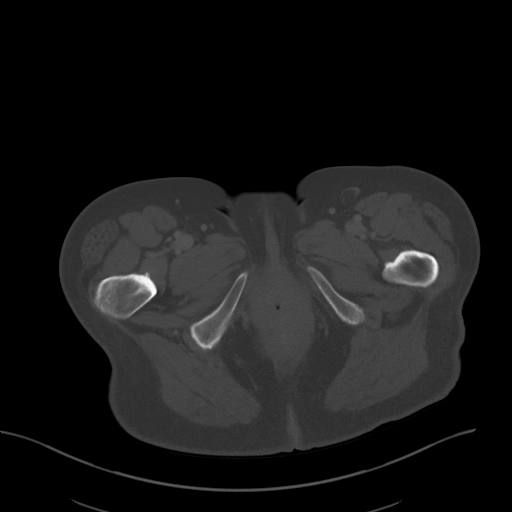
[im 16/97  soft-tissue]
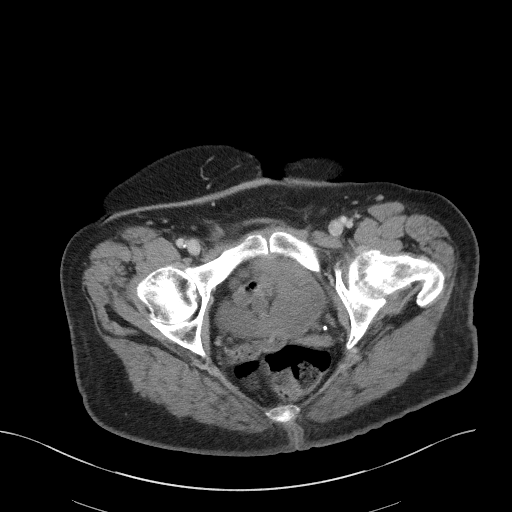
[im 21/97  soft-tissue]
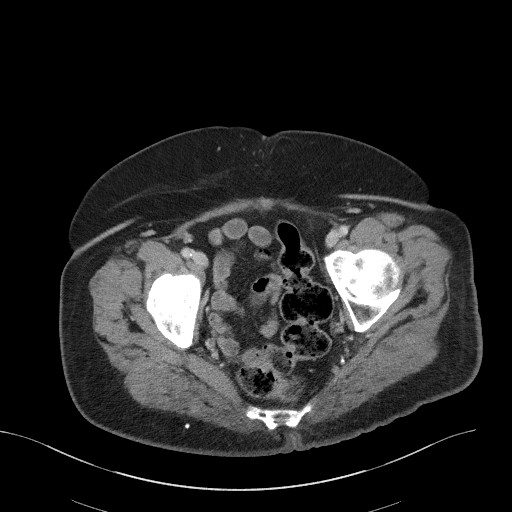
[im 26/97  soft-tissue]
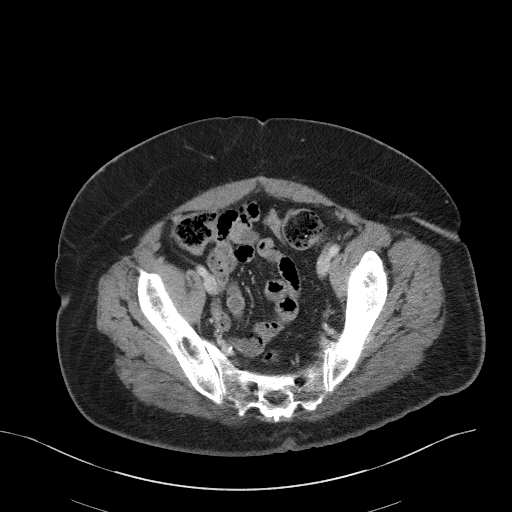
[im 36/97  soft-tissue]
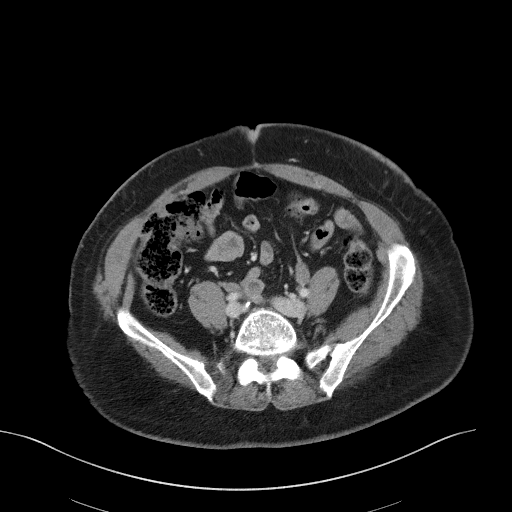
[im 41/97  soft-tissue]
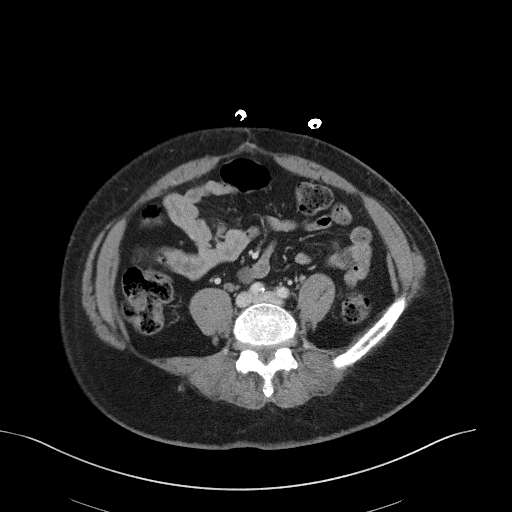
[im 51/97  soft-tissue]
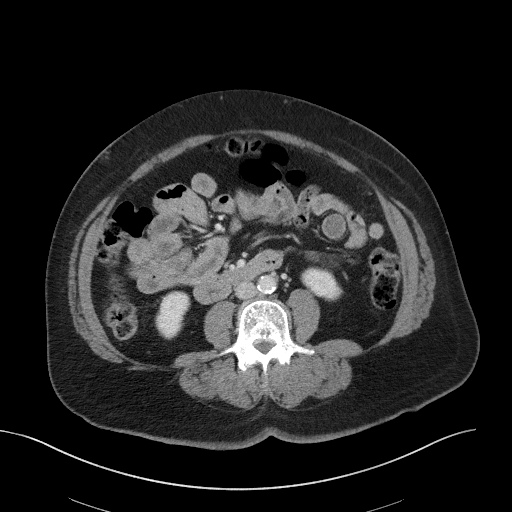
[im 56/97  soft-tissue]
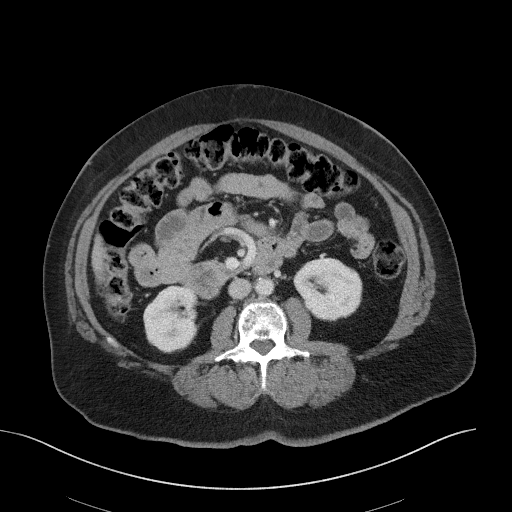
[im 61/97  soft-tissue]
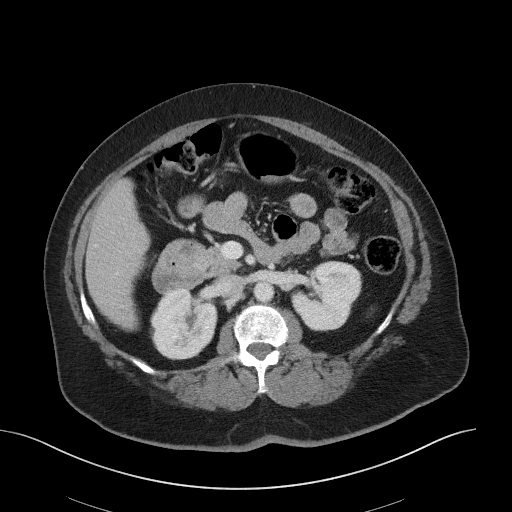
[im 61/97  bone]
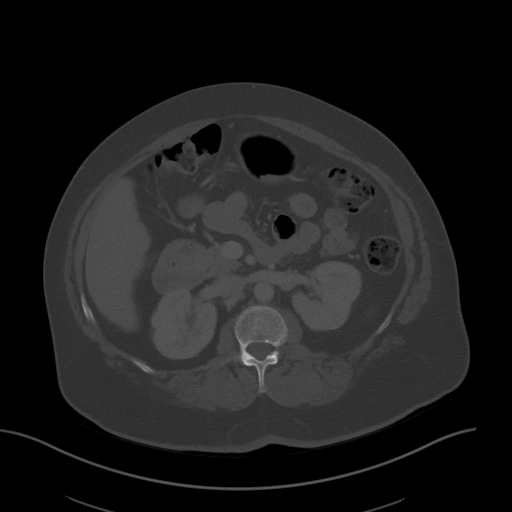
[im 71/97  soft-tissue]
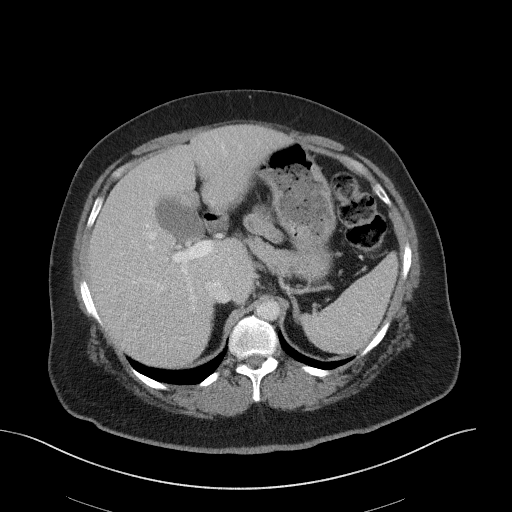
[im 76/97  soft-tissue]
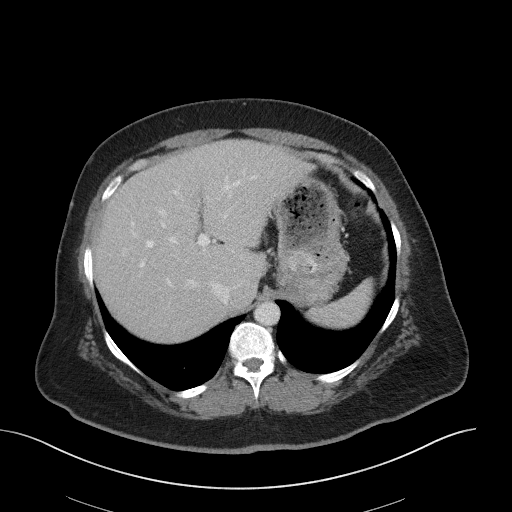
[im 81/97  soft-tissue]
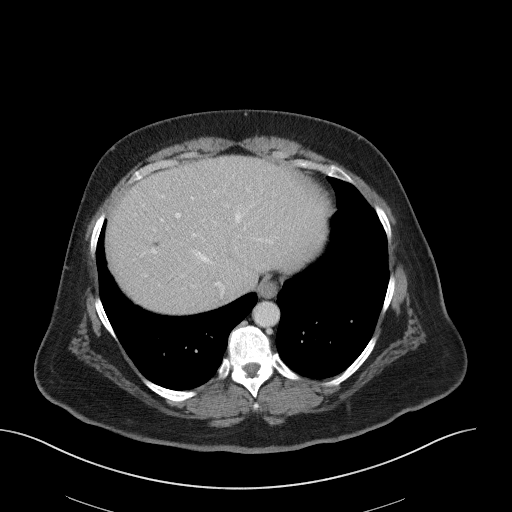
[im 91/97  soft-tissue]
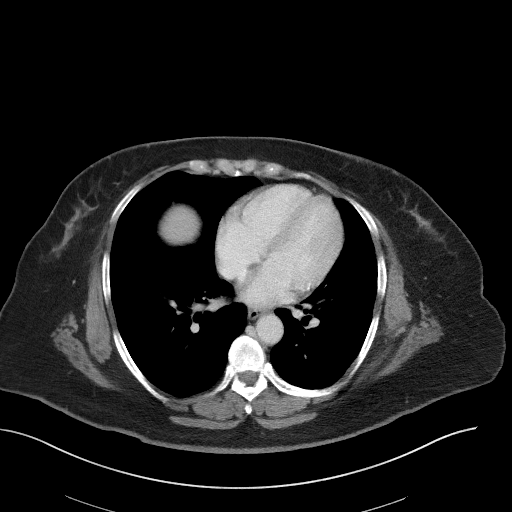

[Series 5: coronal st · coronal · 0.80mm/px · 3 of 102 slices shown]
[im 34/102  soft-tissue]
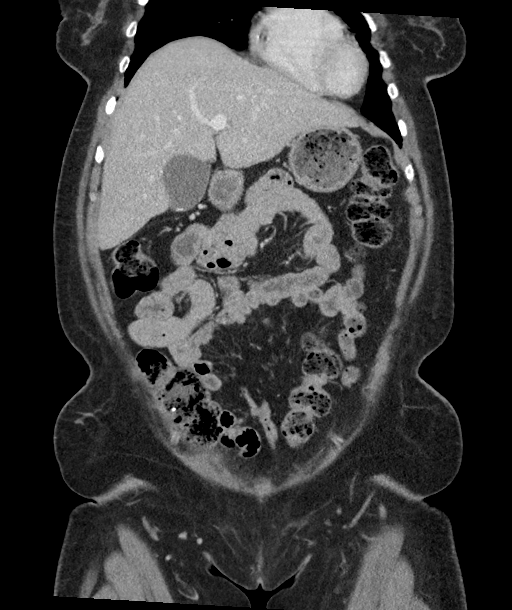
[im 45/102  soft-tissue]
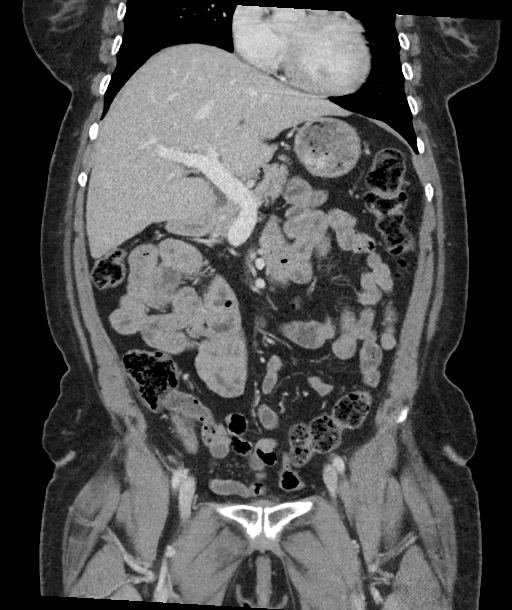
[im 57/102  soft-tissue]
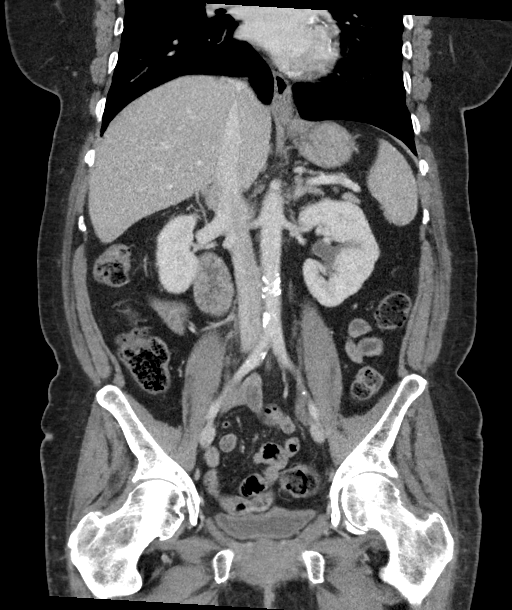

[16 of 46 positions shown; findings below may reference images not displayed]

FINDINGS: LOWER CHEST: Patchy ground-glass opacities bilateral lung bases.
Included heart size is normal. Mild coronary artery calcifications.
No pericardial effusion.

HEPATOBILIARY: Normal gallbladder. Tiny hypodensities in the liver,
too small to characterize. Liver otherwise unremarkable.

PANCREAS: Normal.

SPLEEN: Normal.

ADRENALS/URINARY TRACT: Kidneys are orthotopic, demonstrating
symmetric enhancement. No nephrolithiasis, hydronephrosis or solid
renal masses. The unopacified ureters are normal in course and
caliber. Delayed imaging through the kidneys demonstrates symmetric
prompt contrast excretion within the proximal urinary collecting
system. Urinary bladder is partially distended and unremarkable.
Normal adrenal glands.

STOMACH/BOWEL: The stomach, small and large bowel are normal in
course and caliber without inflammatory changes. Normal appendix.

VASCULAR/LYMPHATIC: Aortoiliac vessels are normal in course and
caliber. Moderate calcific atherosclerosis aortoiliac vessels. No
lymphadenopathy by CT size criteria. Phleboliths in the pelvis.

REPRODUCTIVE: Status post hysterectomy.

OTHER: No intraperitoneal free fluid or free air.

MUSCULOSKELETAL: Nonacute. Moderate LEFT greater than RIGHT hip
osteoarthrosis. Anterior abdominal wall scarring.
IMPRESSION: 1. Moderate amount of retained large bowel stool without bowel
obstruction or acute intra-abdominal/pelvic process.
2. Bilateral included lung non-specific ground-glass consolidation
seen with pneumonia or pulmonary edema.

Aortic Atherosclerosis ([id number]-BV3.3).

## 2018-08-01 ENCOUNTER — Other Ambulatory Visit: Payer: Self-pay | Admitting: Family

## 2018-08-01 DIAGNOSIS — R52 Pain, unspecified: Secondary | ICD-10-CM

## 2018-08-15 ENCOUNTER — Ambulatory Visit
Admission: RE | Admit: 2018-08-15 | Discharge: 2018-08-15 | Disposition: A | Discharge status: Home / Self Care | Payer: Medicare (Managed Care) | Source: Ambulatory Visit | Attending: Family | Admitting: Family

## 2018-08-15 ENCOUNTER — Ambulatory Visit
Admission: RE | Admit: 2018-08-15 | Discharge: 2018-08-15 | Disposition: A | Discharge status: Home / Self Care | Payer: Medicare (Managed Care) | Attending: Family | Admitting: Family

## 2018-08-15 DIAGNOSIS — R52 Pain, unspecified: Secondary | ICD-10-CM | POA: Diagnosis not present

## 2018-11-11 ENCOUNTER — Other Ambulatory Visit: Payer: Self-pay | Admitting: Family

## 2018-11-11 DIAGNOSIS — Z0000 Encounter for general adult medical examination without abnormal findings: Secondary | ICD-10-CM

## 2018-11-11 DIAGNOSIS — Z1231 Encounter for screening mammogram for malignant neoplasm of breast: Secondary | ICD-10-CM

## 2018-12-02 ENCOUNTER — Other Ambulatory Visit: Payer: Self-pay

## 2018-12-23 DIAGNOSIS — F4310 Post-traumatic stress disorder, unspecified: Secondary | ICD-10-CM

## 2018-12-25 ENCOUNTER — Emergency Department: Payer: Medicare (Managed Care)

## 2018-12-25 ENCOUNTER — Emergency Department
Admission: EM | Admit: 2018-12-25 | Discharge: 2018-12-25 | Disposition: A | Discharge status: Home / Self Care | Payer: Medicare (Managed Care) | Attending: Emergency Medicine | Admitting: Emergency Medicine

## 2018-12-25 ENCOUNTER — Other Ambulatory Visit: Payer: Self-pay

## 2018-12-25 DIAGNOSIS — I10 Essential (primary) hypertension: Secondary | ICD-10-CM | POA: Insufficient documentation

## 2018-12-25 DIAGNOSIS — E119 Type 2 diabetes mellitus without complications: Secondary | ICD-10-CM | POA: Insufficient documentation

## 2018-12-25 DIAGNOSIS — R55 Syncope and collapse: Secondary | ICD-10-CM | POA: Diagnosis present

## 2018-12-25 DIAGNOSIS — E860 Dehydration: Secondary | ICD-10-CM | POA: Diagnosis not present

## 2018-12-25 DIAGNOSIS — F17210 Nicotine dependence, cigarettes, uncomplicated: Secondary | ICD-10-CM | POA: Diagnosis not present

## 2018-12-25 DIAGNOSIS — Z794 Long term (current) use of insulin: Secondary | ICD-10-CM | POA: Diagnosis not present

## 2018-12-25 DIAGNOSIS — Z79899 Other long term (current) drug therapy: Secondary | ICD-10-CM | POA: Diagnosis not present

## 2018-12-25 LAB — CBC WITH DIFFERENTIAL/PLATELET
Abs Immature Granulocytes: 0.04 10*3/uL (ref 0.00–0.07)
Basophils Absolute: 0 10*3/uL (ref 0.0–0.1)
Basophils Relative: 0 %
Eosinophils Absolute: 0.1 10*3/uL (ref 0.0–0.5)
Eosinophils Relative: 1 %
HCT: 36.5 % (ref 36.0–46.0)
Hemoglobin: 11.1 g/dL — ABNORMAL LOW (ref 12.0–15.0)
Immature Granulocytes: 0 %
Lymphocytes Relative: 20 %
Lymphs Abs: 2.3 10*3/uL (ref 0.7–4.0)
MCH: 24.9 pg — ABNORMAL LOW (ref 26.0–34.0)
MCHC: 30.4 g/dL (ref 30.0–36.0)
MCV: 82 fL (ref 80.0–100.0)
Monocytes Absolute: 0.6 10*3/uL (ref 0.1–1.0)
Monocytes Relative: 5 %
Neutro Abs: 8.4 10*3/uL — ABNORMAL HIGH (ref 1.7–7.7)
Neutrophils Relative %: 74 %
Platelets: 286 10*3/uL (ref 150–400)
RBC: 4.45 MIL/uL (ref 3.87–5.11)
RDW: 15.3 % (ref 11.5–15.5)
WBC: 11.5 10*3/uL — ABNORMAL HIGH (ref 4.0–10.5)
nRBC: 0 % (ref 0.0–0.2)

## 2018-12-25 LAB — URINALYSIS, COMPLETE (UACMP) WITH MICROSCOPIC
Bacteria, UA: NONE SEEN
Bilirubin Urine: NEGATIVE
Glucose, UA: >=500 mg/dL — AB
Hgb urine dipstick: NEGATIVE
Ketones, ur: NEGATIVE mg/dL
Leukocytes,Ua: NEGATIVE
Nitrite: NEGATIVE
Protein, ur: NEGATIVE mg/dL
Specific Gravity, Urine: 1.02 (ref 1.005–1.030)
pH: 6 (ref 5.0–8.0)

## 2018-12-25 LAB — BASIC METABOLIC PANEL
Anion gap: 5 (ref 5–15)
BUN: 9 mg/dL (ref 8–23)
CO2: 27 mmol/L (ref 22–32)
Calcium: 9.2 mg/dL (ref 8.9–10.3)
Chloride: 107 mmol/L (ref 98–111)
Creatinine, Ser: 0.81 mg/dL (ref 0.44–1.00)
GFR calc Af Amer: >60 mL/min (ref >60)
GFR calc non Af Amer: >60 mL/min (ref >60)
Glucose, Bld: 91 mg/dL (ref 70–99)
Potassium: 4.3 mmol/L (ref 3.5–5.1)
Sodium: 139 mmol/L (ref 135–145)

## 2018-12-25 LAB — TROPONIN I (HIGH SENSITIVITY): Troponin I (High Sensitivity): 4 ng/L (ref <18)

## 2018-12-25 MED ORDER — SODIUM CHLORIDE 0.9 % IV BOLUS
1000.0000 mL | Freq: Once | INTRAVENOUS | Status: AC
  Administered 2018-12-25: 1000 mL via INTRAVENOUS

## 2018-12-25 NOTE — ED Notes (Signed)
Pt c/o R knee pain upon standing. States this is not chronic. States may have hit it today when she passed out.

## 2018-12-25 NOTE — ED Notes (Signed)
Pt denies hitting head today.

## 2018-12-25 NOTE — ED Notes (Addendum)
Pt updated. Pt to be switched to room 18 soon.

## 2018-12-25 NOTE — ED Provider Notes (Signed)
[facility] Emergency Department Provider Note  ____________________________________________   First MD Initiated Contact with Patient 12/25/18 1301     (approximate)  I have reviewed the triage vital signs and the nursing notes.   HISTORY  Chief Complaint Loss of Consciousness    HPI Brett Fairyatricia Lenz is a 68 y.o. female with history of diabetes, hypertension, here with near syncopal episode.  The patient states that yesterday, she got 3 teeth pulled at the dentist.  She admits to not eating and drinking as much.  She got up this morning, and started to get ready for the day.  She states she began to feel mildly lightheaded, and weak in her bilateral legs.  She then felt like her leg gave out, causing her to go to the floor.  She does not believe she actually lost consciousness.  She did not hit her head.  She states that she now feels normal.  She denies any recent medication changes.  She does admit to not eating and drinking much yesterday.  Her tooth pain is improving.  She been taking ibuprofen as prescribed.  There is no fevers or chills.  No chest pain or palpitations.  No history of recurrent syncope.  No known history of coronary disease.        Past Medical History:  Diagnosis Date  . Diabetes mellitus without complication (HCC)   . Hypertension     Patient Active Problem List   Diagnosis Date Noted  . MDD (major depressive disorder) 11/10/2017  . 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 Posttraumatic stress disorder     Past Surgical History:  Procedure Laterality Date  . ABDOMINAL HYSTERECTOMY    . EYE SURGERY      Prior to Admission medications   Medication Sig Start Date End Date Taking? Authorizing Provider  amLODipine (NORVASC) 10 MG tablet Take 1 tablet (10 mg total) by mouth daily. 11/13/17   Denzil Magnusonhomas, Lashunda, NP  atorvastatin (LIPITOR) 80 MG tablet Take 1 tablet (80 mg total) by mouth  daily. 11/13/17   Denzil Magnusonhomas, Lashunda, NP  busPIRone (BUSPAR) 15 MG tablet Take 1 tablet (15 mg total) by mouth 2 (two) times daily. 11/13/17   Denzil Magnusonhomas, Lashunda, NP  gabapentin (NEURONTIN) 100 MG capsule Take 1 capsule (100 mg total) by mouth at bedtime. 11/13/17   Denzil Magnusonhomas, Lashunda, NP  hydrochlorothiazide (HYDRODIURIL) 25 MG tablet Take 1 tablet (25 mg total) by mouth daily. 11/14/17   Denzil Magnusonhomas, Lashunda, NP  hydrOXYzine (ATARAX/VISTARIL) 25 MG tablet Take 1 tablet (25 mg total) by mouth 3 (three) times daily as needed for anxiety. 11/13/17   Denzil Magnusonhomas, Lashunda, NP  insulin glargine (LANTUS) 100 UNIT/ML injection Inject 0.5 mLs (50 Units total) into the skin at bedtime. 11/13/17   Denzil Magnusonhomas, Lashunda, NP  insulin lispro (HUMALOG KWIKPEN) 100 UNIT/ML KiwkPen Inject 20 Units into the skin 3 (three) times daily. Plus sliding scale for up to 100 units TDD 04/18/16 04/18/17  [provider]  lactulose (CEPHULAC) 10 g packet Take 1 packet (10 g total) by mouth 3 (three) times daily. 01/09/18   Willy Eddyobinson, Patrick, MD  losartan (COZAAR) 100 MG tablet Take 1 tablet (100 mg total) by mouth daily. 11/14/17   Denzil Magnusonhomas, Lashunda, NP  meloxicam (MOBIC) 7.5 MG tablet Take 1 tablet (7.5 mg total) by mouth 2 (two) times daily. 11/13/17   Denzil Magnusonhomas, Lashunda, NP  metFORMIN (GLUCOPHAGE) 500 MG tablet Take 1 tablet (500 mg total) by mouth 2 (two) times  daily with a meal. 11/13/17   Denzil Magnuson, NP  NOVOLOG FLEXPEN 100 UNIT/ML FlexPen Inject 20 Units into the skin 3 (three) times daily with meals. Plus sliding scale for up to 100 units TDD 03/22/17   [provider]  omeprazole (PRILOSEC) 40 MG capsule Take 1 capsule by mouth daily. 04/08/17   [provider]  PARoxetine (PAXIL) 40 MG tablet Take 1 tablet (40 mg total) by mouth every morning. 11/13/17   Denzil Magnuson, NP  SPIRIVA HANDIHALER 18 MCG inhalation capsule Place 1 capsule into inhaler and inhale daily. 03/22/17   [provider]  traZODone (DESYREL) 50 MG  tablet Take 1 tablet (50 mg total) by mouth at bedtime as needed for sleep. 11/13/17   Denzil Magnuson, NP  VENTOLIN HFA 108 (90 Base) MCG/ACT inhaler Inhale 2 puffs into the lungs every 6 (six) hours as needed. 03/26/17   [provider]    Allergies Ace inhibitors and Fluticasone  History reviewed. No pertinent family history.  Social History Social History   Tobacco Use  . Smoking status: Current Every Day Smoker    Packs/day: 0.50  . Smokeless tobacco: Never Used  Substance Use Topics  . Alcohol use: No    Frequency: Never  . Drug use: No    Review of Systems  Review of Systems  Constitutional: Positive for fatigue. Negative for fever.  HENT: Negative for congestion and sore throat.   Eyes: Negative for visual disturbance.  Respiratory: Negative for cough and shortness of breath.   Cardiovascular: Negative for chest pain.  Gastrointestinal: Negative for abdominal pain, diarrhea, nausea and vomiting.  Genitourinary: Negative for flank pain.  Musculoskeletal: Negative for back pain and neck pain.  Skin: Negative for rash and wound.  Neurological: Positive for syncope, weakness and light-headedness.  All other systems reviewed and are negative.    ____________________________________________  PHYSICAL EXAM:      VITAL SIGNS: ED Triage Vitals  Enc Vitals Group     BP 12/25/18 1301 106/72     Pulse Rate 12/25/18 1301 66     Resp 12/25/18 1301 18     Temp 12/25/18 1301 98.7 F (37.1 C)     Temp Source 12/25/18 1301 Oral     SpO2 12/25/18 1301 98 %     Weight 12/25/18 1258 171 lb (77.6 kg)     Height 12/25/18 1258 5\' 5"  (1.651 m)     Head Circumference --      Peak Flow --      Pain Score 12/25/18 1257 0     Pain Loc --      Pain Edu? --      Excl. in GC? --      Physical Exam Vitals signs and nursing note reviewed.  Constitutional:      General: She is not in acute distress.    Appearance: She is well-developed.  HENT:     Head: Normocephalic  and atraumatic.     Mouth/Throat:     Mouth: Mucous membranes are dry.  Eyes:     Conjunctiva/sclera: Conjunctivae normal.  Neck:     Musculoskeletal: Neck supple.  Cardiovascular:     Rate and Rhythm: Normal rate and regular rhythm.     Heart sounds: Normal heart sounds. No murmur. No friction rub.  Pulmonary:     Effort: Pulmonary effort is normal. No respiratory distress.     Breath sounds: Normal breath sounds. No wheezing or rales.  Abdominal:  General: There is no distension.     Palpations: Abdomen is soft.     Tenderness: There is no abdominal tenderness.  Skin:    General: Skin is warm.     Capillary Refill: Capillary refill takes less than 2 seconds.  Neurological:     Mental Status: She is alert and oriented to person, place, and time.     GCS: GCS eye subscore is 4. GCS verbal subscore is 5. GCS motor subscore is 6.     Cranial Nerves: No cranial nerve deficit.     Sensory: Sensation is intact. No sensory deficit.     Motor: No abnormal muscle tone.     Coordination: Coordination is intact.       ____________________________________________   LABS (all labs ordered are listed, but only abnormal results are displayed)  Labs Reviewed  CBC WITH DIFFERENTIAL/PLATELET - Abnormal; Notable for the following components:      Result Value   WBC 11.5 (*)    Hemoglobin 11.1 (*)    MCH 24.9 (*)    Neutro Abs 8.4 (*)    All other components within normal limits  URINALYSIS, COMPLETE (UACMP) WITH MICROSCOPIC - Abnormal; Notable for the following components:   Color, Urine YELLOW (*)    APPearance HAZY (*)    Glucose, UA >=500 (*)    All other components within normal limits  BASIC METABOLIC PANEL  TROPONIN I (HIGH SENSITIVITY)    ____________________________________________  EKG: Normal sinus rhythm, ventricular rate 68.  PR 140, QRS 98, QTc 440.  No acute ST elevations or depressions. ________________________________________  RADIOLOGY All imaging,  including plain films, CT scans, and ultrasounds, independently reviewed by me, and interpretations confirmed via formal radiology reads.  ED MD interpretation:   Chest x-ray: Negative Knee x-ray: Negative  Official radiology report(s): Dg Chest 2 View  Result Date: 12/25/2018 CLINICAL DATA:  Fall after syncope. EXAM: CHEST - 2 VIEW COMPARISON:  November 25, 2017. FINDINGS: The heart size and mediastinal contours are within normal limits. Both lungs are clear. The visualized skeletal structures are unremarkable. IMPRESSION: No active cardiopulmonary disease. Electronically Signed   By: Marijo Conception M.D.   On: 12/25/2018 15:08   Dg Knee Complete 4 Views Right  Result Date: 12/25/2018 CLINICAL DATA:  Right knee pain after fall EXAM: RIGHT KNEE - COMPLETE 4+ VIEW COMPARISON:  None. FINDINGS: No evidence of fracture, dislocation, or joint effusion. Joint spaces are maintained. Distal quadriceps enthesopathy. Soft tissues are unremarkable. IMPRESSION: Negative. Electronically Signed   By: Davina Poke M.D.   On: 12/25/2018 15:08    ____________________________________________  PROCEDURES   Procedure(s) performed (including Critical Care):  Procedures  ____________________________________________  INITIAL IMPRESSION / MDM / [place] / ED COURSE  As part of my medical decision making, I reviewed the following data within the [place] was evaluated in Emergency Department on 12/25/2018 for the symptoms described in the history of present illness. She was evaluated in the context of the global COVID-19 pandemic, which necessitated consideration that the patient might be at risk for infection with the SARS-CoV-2 virus that causes COVID-19. Institutional protocols and algorithms that pertain to the evaluation of patients at risk for COVID-19 are in a state of rapid change based on information released by regulatory bodies including  the CDC and federal and state organizations. These policies and algorithms were followed during the patient's care in the ED.  Some ED  evaluations and interventions may be delayed as a result of limited staffing during the pandemic.*   Clinical Course as of Dec 24 1721  Thu Dec 25, 2018  1324 69 yo F here with near syncopal episode. VSS here though mildly hypotensive. Suspect possible orthostasis in setting of poor PO intake 2/2 getting her teeth pulled yesterday. No bleeding from her dental sites. Pt is o/w without complaints and had reassuring prodrome. She is a patient of [facility] and would like to avoid hospitalization. Will check basic labs, EKG, keep on monitor and re-assess.    [CI]  1538 Case was discussed with patient's provider at [facility].  She has a mild baseline anemia and leukocytosis that is not acutely worsened.  Troponin negative.  BMP unremarkable.  Of note, her baseline heart rate is in the 50s and blood pressure in the 140s per [facility].  Suspect this is due to dehydration.  Is a history of similar near syncopal episodes in the setting of not eating and drinking much.  Otherwise, she appears clinically well.  Urinalysis is pending.  Per shared decision making discussion with the patient, she would like to return home which I think is reasonable.  Her [facility] provider is aware and will follow up very closely with her.  Suspect this was likely a combination of dehydration and mild orthostasis due to poor p.o. intake from getting her teeth pulled yesterday.  No evidence of arrhythmia, ACS, PE, or acute emergent pathology.   [CI]  1708 Urinalysis reassuring.  Patient feels markedly improved with fluids and her blood pressure is back to her baseline.  I suspect this was likely due to p.o. or p.o. intake.  As mentioned, based on shared decision-making with the patient as well as her PCP, will have her follow-up at home.  Return precautions given.   [CI]    Clinical Course User Index [CI] Shaune Pollack,  MD    Medical Decision Making: As above  ____________________________________________  FINAL CLINICAL IMPRESSION(S) / ED DIAGNOSES  Final diagnoses:  Near syncope  Dehydration     MEDICATIONS GIVEN DURING THIS VISIT:  Medications  sodium chloride 0.9 % bolus 1,000 mL (0 mLs Intravenous Stopped 12/25/18 1605)     ED Discharge Orders    None       Note:  This document was prepared using Dragon voice recognition software and may include unintentional dictation errors.   Shaune Pollack, MD 12/25/18 1723

## 2018-12-25 NOTE — ED Notes (Signed)
Pt given snack and drink with verbal okay from EDP Isaacs.  

## 2018-12-25 NOTE — ED Notes (Signed)
EKG to EDP Isaacs.  

## 2018-12-25 NOTE — ED Notes (Signed)
Pt leaving for imaging.

## 2018-12-25 NOTE — ED Notes (Signed)
EKG completed by Sam RN.

## 2018-12-25 NOTE — Discharge Instructions (Addendum)
As we discussed, it is very important that you eat frequent, small meals, and eat a normal amount over the next several days.  Drink at least 6 to 8 glasses of water throughout the next 24 hours.  Call your doctor's office on discharge to set up an appointment in the next 12 to 24 hours.  Be very careful when going from sitting to standing.  If you begin to feel lightheaded, sit back down immediately, and ideally lay down.

## 2018-12-25 NOTE — ED Triage Notes (Signed)
Pt in via EMS from home d/t near syncope. BG 102. 98/58 BP with EMS. NSR unremarkable EKG with EMS. Unsteady gate, NO facial droop or slurred speech. DNR and Most form with pt. DM and HTN history. 20g L fa IV by EMS.

## 2018-12-25 NOTE — ED Notes (Signed)
Red/grn/blue/lav tubes sent to lab. Lab aware tubes sent with white labels as yellow ones wouldn't print from portable computer.

## 2018-12-25 NOTE — ED Notes (Signed)
Pt up to bedside toilet to provide urine sample. Pt steady with cane. States R knee still hurts but feels it is "probably just related to the joint." Will send urine sample to lab once pt done.

## 2019-02-18 IMAGING — DX DG CHEST 1V PORT
1 series · 1 of 1 positions shown · non-contrast
Comparison: None.

CLINICAL DATA: 67-year-old female with chest pain and shortness of
breath.

EXAM:
PORTABLE CHEST 1 VIEW

[chest ap]
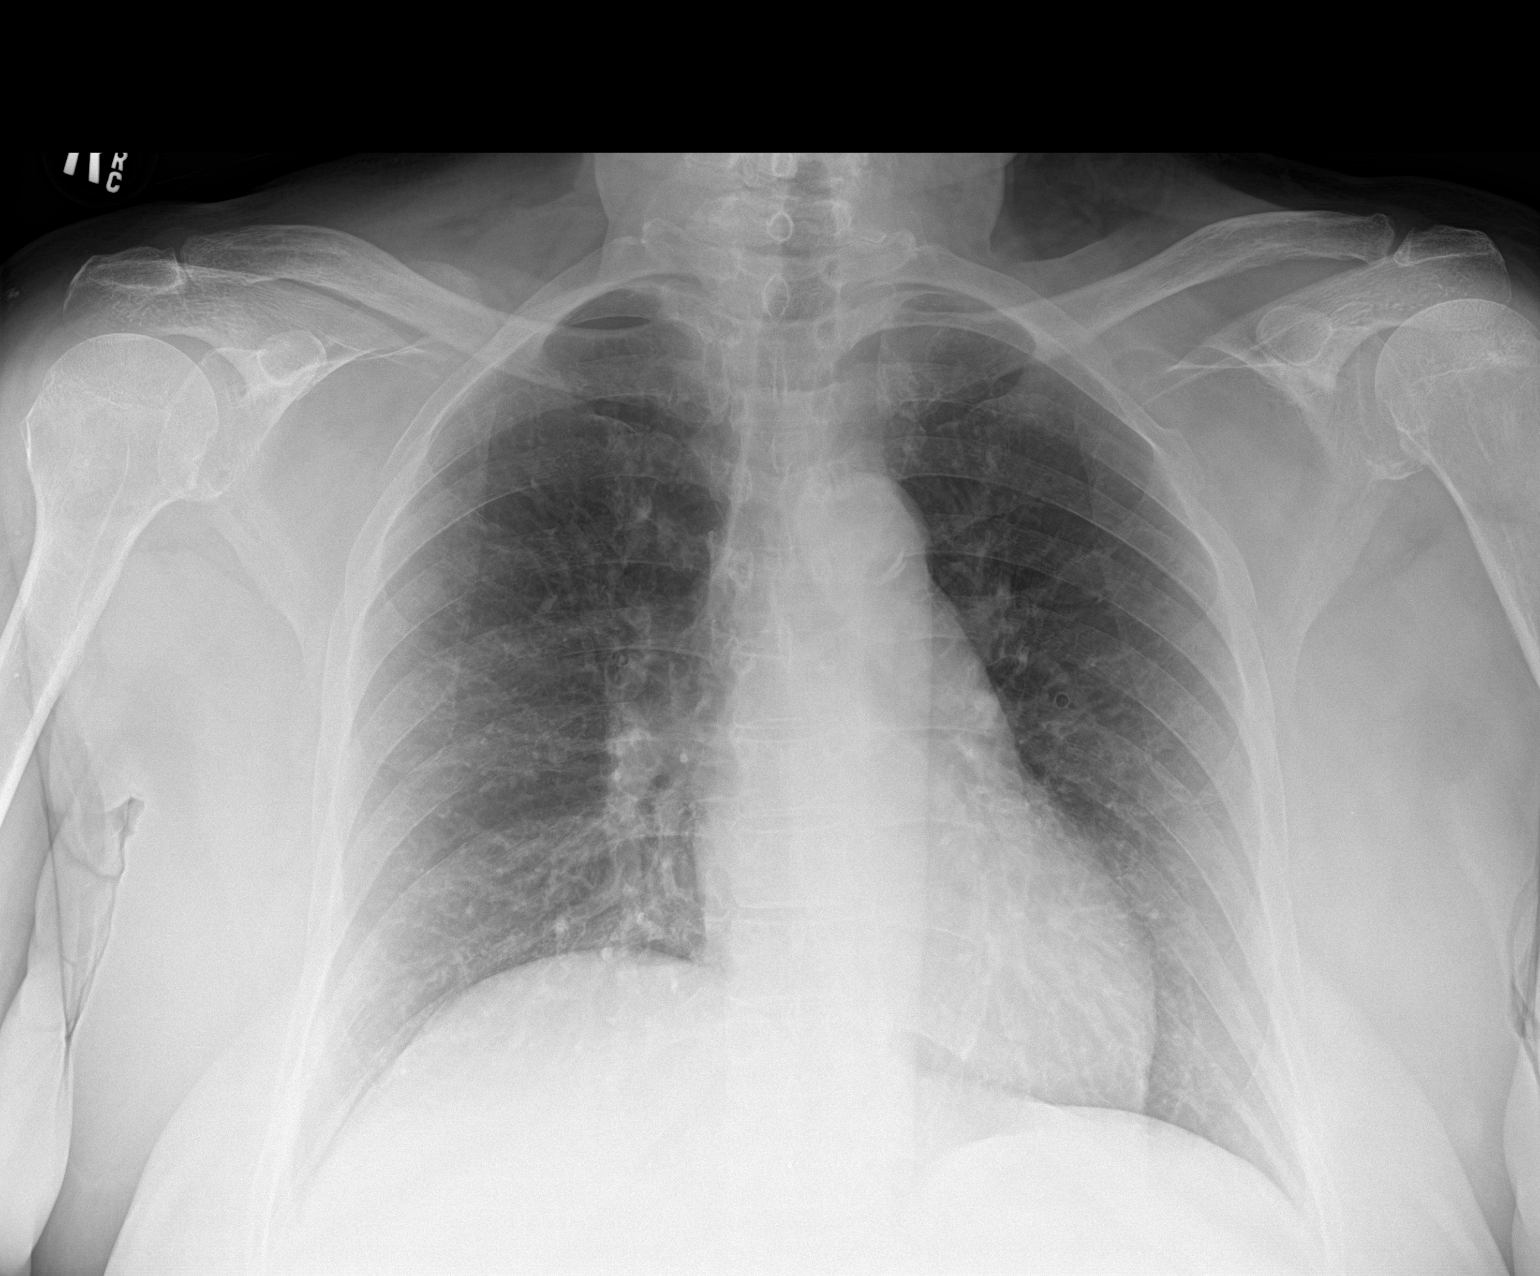

[1 of 1 positions shown; findings below may reference images not displayed]

FINDINGS: There is no focal consolidation, pleural effusion, or pneumothorax.
The cardiac silhouette is within normal limits. Atherosclerotic
calcification of the aortic arch. No acute osseous pathology.
IMPRESSION: No active disease.

## 2019-11-19 ENCOUNTER — Other Ambulatory Visit: Payer: Self-pay | Admitting: Family

## 2019-11-19 DIAGNOSIS — Z1231 Encounter for screening mammogram for malignant neoplasm of breast: Secondary | ICD-10-CM

## 2019-12-10 ENCOUNTER — Ambulatory Visit
Admission: RE | Admit: 2019-12-10 | Discharge: 2019-12-10 | Disposition: A | Discharge status: Home / Self Care | Payer: Medicare (Managed Care) | Source: Ambulatory Visit | Attending: Family | Admitting: Family

## 2019-12-10 ENCOUNTER — Other Ambulatory Visit: Payer: Self-pay

## 2019-12-10 DIAGNOSIS — Z1231 Encounter for screening mammogram for malignant neoplasm of breast: Secondary | ICD-10-CM | POA: Diagnosis present

## 2019-12-14 ENCOUNTER — Inpatient Hospital Stay
Admission: RE | Admit: 2019-12-14 | Discharge: 2019-12-14 | Disposition: A | Discharge status: Home / Self Care | Payer: Self-pay | Source: Ambulatory Visit | Attending: *Deleted | Admitting: *Deleted

## 2019-12-14 ENCOUNTER — Other Ambulatory Visit: Payer: Self-pay | Admitting: *Deleted

## 2019-12-14 DIAGNOSIS — Z1231 Encounter for screening mammogram for malignant neoplasm of breast: Secondary | ICD-10-CM

## 2019-12-16 ENCOUNTER — Other Ambulatory Visit: Payer: Self-pay | Admitting: Family

## 2019-12-16 DIAGNOSIS — N6320 Unspecified lump in the left breast, unspecified quadrant: Secondary | ICD-10-CM

## 2019-12-16 DIAGNOSIS — R928 Other abnormal and inconclusive findings on diagnostic imaging of breast: Secondary | ICD-10-CM

## 2020-01-21 ENCOUNTER — Ambulatory Visit
Admission: RE | Admit: 2020-01-21 | Discharge: 2020-01-21 | Disposition: A | Discharge status: Home / Self Care | Payer: Medicare (Managed Care) | Source: Ambulatory Visit | Attending: Family | Admitting: Family

## 2020-01-21 ENCOUNTER — Other Ambulatory Visit: Payer: Self-pay

## 2020-01-21 DIAGNOSIS — N6320 Unspecified lump in the left breast, unspecified quadrant: Secondary | ICD-10-CM

## 2020-01-21 DIAGNOSIS — R928 Other abnormal and inconclusive findings on diagnostic imaging of breast: Secondary | ICD-10-CM | POA: Diagnosis present

## 2020-03-16 ENCOUNTER — Other Ambulatory Visit (HOSPITAL_COMMUNITY): Payer: Self-pay | Admitting: Family

## 2020-03-16 ENCOUNTER — Other Ambulatory Visit: Payer: Self-pay | Admitting: Family

## 2020-03-16 DIAGNOSIS — S0990XA Unspecified injury of head, initial encounter: Secondary | ICD-10-CM

## 2020-03-16 DIAGNOSIS — R519 Headache, unspecified: Secondary | ICD-10-CM

## 2020-03-17 ENCOUNTER — Ambulatory Visit
Admission: RE | Admit: 2020-03-17 | Discharge: 2020-03-17 | Disposition: A | Discharge status: Home / Self Care | Payer: Medicare (Managed Care) | Source: Ambulatory Visit | Attending: Family | Admitting: Family

## 2020-03-17 ENCOUNTER — Other Ambulatory Visit: Payer: Self-pay

## 2020-03-17 DIAGNOSIS — S0990XA Unspecified injury of head, initial encounter: Secondary | ICD-10-CM

## 2020-03-17 DIAGNOSIS — R519 Headache, unspecified: Secondary | ICD-10-CM | POA: Insufficient documentation

## 2020-04-08 ENCOUNTER — Other Ambulatory Visit: Payer: Self-pay | Admitting: Family

## 2020-04-08 DIAGNOSIS — Z1231 Encounter for screening mammogram for malignant neoplasm of breast: Secondary | ICD-10-CM

## 2020-08-29 ENCOUNTER — Encounter: Payer: Self-pay | Admitting: Ophthalmology

## 2020-08-29 ENCOUNTER — Encounter: Payer: Self-pay | Admitting: Anesthesiology

## 2020-09-13 ENCOUNTER — Ambulatory Visit (LOCAL_COMMUNITY_HEALTH_CENTER): Payer: Medicare (Managed Care)

## 2020-09-13 ENCOUNTER — Other Ambulatory Visit: Payer: Self-pay

## 2020-09-13 DIAGNOSIS — Z111 Encounter for screening for respiratory tuberculosis: Secondary | ICD-10-CM

## 2020-09-13 NOTE — Progress Notes (Signed)
Patient is here today for QFT testing.  Her daughter has Active TB. Informed [facility] will TB test now and in 8-10 weeks. Patient verbalized understanding.  Would like HIV testing today.Richmond Campbell, RN    No charge QFT

## 2020-09-16 LAB — QUANTIFERON-TB GOLD PLUS
QuantiFERON Mitogen Value: >10 IU/mL
QuantiFERON Nil Value: 8.51 IU/mL
QuantiFERON TB1 Ag Value: >10 IU/mL
QuantiFERON TB2 Ag Value: >10 IU/mL
QuantiFERON-TB Gold Plus: UNDETERMINED — AB

## 2020-09-16 LAB — HM HIV SCREENING LAB: HM HIV Screening: NEGATIVE

## 2020-09-22 ENCOUNTER — Encounter: Payer: Self-pay | Admitting: Ophthalmology

## 2020-09-22 NOTE — Progress Notes (Signed)
Spoke to patient regarding upcoming cataract surgery,  Written instructions mailed to patient per her request

## 2020-09-22 NOTE — Discharge Instructions (Addendum)

## 2020-09-26 ENCOUNTER — Ambulatory Visit (LOCAL_COMMUNITY_HEALTH_CENTER): Payer: Medicare (Managed Care)

## 2020-09-26 DIAGNOSIS — Z111 Encounter for screening for respiratory tuberculosis: Secondary | ICD-10-CM

## 2020-09-26 NOTE — Progress Notes (Signed)
Patient in clinic today for a repeat QFT d/t indeterminate QFT test from 09/13/20. Denies TB sx. Richmond Campbell, RN   No charge QFT today

## 2020-10-01 LAB — QUANTIFERON-TB GOLD PLUS
QuantiFERON Mitogen Value: 3.97 IU/mL
QuantiFERON Nil Value: 2.28 IU/mL
QuantiFERON TB1 Ag Value: >10 IU/mL
QuantiFERON TB2 Ag Value: >10 IU/mL
QuantiFERON-TB Gold Plus: POSITIVE — AB

## 2020-10-03 ENCOUNTER — Other Ambulatory Visit: Payer: Self-pay | Admitting: Family Medicine

## 2020-10-03 ENCOUNTER — Telehealth: Payer: Self-pay

## 2020-10-03 ENCOUNTER — Ambulatory Visit (LOCAL_COMMUNITY_HEALTH_CENTER): Payer: Medicare (Managed Care)

## 2020-10-03 VITALS — Wt 173.0 lb

## 2020-10-03 DIAGNOSIS — R7612 Nonspecific reaction to cell mediated immunity measurement of gamma interferon antigen response without active tuberculosis: Secondary | ICD-10-CM

## 2020-10-03 NOTE — Telephone Encounter (Signed)
TC with patient. Informed +QFT and need for CXR. Denies sx's of TB.  TB RN will call [facility] to see if they can do baseline labs for patient. (CBC and LFTs). Patient would like to do LTBI tx Richmond Campbell, RN

## 2020-10-03 NOTE — Telephone Encounter (Signed)
TC with [facility]. [facility] aware of +QFT; will draw CBC and LFTs Thursday when patient is there.  Informed CXR order has been faxed to [facility]. Richmond Campbell, RN

## 2020-10-05 ENCOUNTER — Ambulatory Visit: Admission: RE | Admit: 2020-10-05 | Payer: Medicare (Managed Care) | Source: Home / Self Care | Admitting: Ophthalmology

## 2020-10-05 DIAGNOSIS — R7612 Nonspecific reaction to cell mediated immunity measurement of gamma interferon antigen response without active tuberculosis: Secondary | ICD-10-CM | POA: Insufficient documentation

## 2020-10-05 HISTORY — DX: Thyrotoxicosis with diffuse goiter without thyrotoxic crisis or storm: E05.00

## 2020-10-05 HISTORY — DX: Unspecified osteoarthritis, unspecified site: M19.90

## 2020-10-05 HISTORY — DX: Unspecified asthma, uncomplicated: J45.909

## 2020-10-05 SURGERY — PHACOEMULSIFICATION, CATARACT, WITH IOL INSERTION
Anesthesia: Topical | Laterality: Right

## 2020-10-05 NOTE — Progress Notes (Signed)
EPI completed via phone.  Patient informed +QFT and need for CXR.  States she will go Thursday 10/06/20 for CXR.  TB RN spoke with RN at [facility]. [facility] will draw baseline labs when patient comes for her regular visit on 10/06/20. HIV on 09/13/20 was negative. Patient is a close contact to her daughter that is being treated for Active TB. Patient denies sx's of TB.  Discussed LTBI tx and patient would like to proceed with treatment once the CXR is complete.Richmond Campbell, RN

## 2020-10-07 ENCOUNTER — Ambulatory Visit
Admission: RE | Admit: 2020-10-07 | Discharge: 2020-10-07 | Disposition: A | Discharge status: Home / Self Care | Payer: Medicare (Managed Care) | Source: Ambulatory Visit | Attending: Family Medicine | Admitting: Family Medicine

## 2020-10-07 DIAGNOSIS — R7612 Nonspecific reaction to cell mediated immunity measurement of gamma interferon antigen response without active tuberculosis: Secondary | ICD-10-CM

## 2020-10-17 ENCOUNTER — Other Ambulatory Visit (LOCAL_COMMUNITY_HEALTH_CENTER): Payer: Medicare (Managed Care)

## 2020-10-17 DIAGNOSIS — R7612 Nonspecific reaction to cell mediated immunity measurement of gamma interferon antigen response without active tuberculosis: Secondary | ICD-10-CM
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
## 2020-10-18 LAB — CBC WITH DIFFERENTIAL/PLATELET
Basophils Absolute: 0.1 10*3/uL (ref 0.0–0.2)
Basos: 1 %
EOS (ABSOLUTE): 0.3 10*3/uL (ref 0.0–0.4)
Eos: 3 %
Hematocrit: 34.4 % (ref 34.0–46.6)
Hemoglobin: 10.1 g/dL — ABNORMAL LOW (ref 11.1–15.9)
Immature Grans (Abs): 0 10*3/uL (ref 0.0–0.1)
Immature Granulocytes: 0 %
Lymphocytes Absolute: 2.6 10*3/uL (ref 0.7–3.1)
Lymphs: 25 %
MCH: 22.3 pg — ABNORMAL LOW (ref 26.6–33.0)
MCHC: 29.4 g/dL — ABNORMAL LOW (ref 31.5–35.7)
MCV: 76 fL — ABNORMAL LOW (ref 79–97)
Monocytes Absolute: 0.6 10*3/uL (ref 0.1–0.9)
Monocytes: 5 %
Neutrophils Absolute: 7 10*3/uL (ref 1.4–7.0)
Neutrophils: 66 %
Platelets: 361 10*3/uL (ref 150–450)
RBC: 4.53 x10E6/uL (ref 3.77–5.28)
RDW: 15.9 % — ABNORMAL HIGH (ref 11.7–15.4)
WBC: 10.5 10*3/uL (ref 3.4–10.8)

## 2020-10-18 LAB — HEPATIC FUNCTION PANEL
ALT: 6 IU/L (ref 0–32)
AST: 19 IU/L (ref 0–40)
Albumin: 4.1 g/dL (ref 3.7–4.7)
Alkaline Phosphatase: 143 IU/L — ABNORMAL HIGH (ref 44–121)
Bilirubin Total: <0.2 mg/dL (ref 0.0–1.2)
Bilirubin, Direct: <0.1 mg/dL (ref 0.00–0.40)
Total Protein: 7.6 g/dL (ref 6.0–8.5)

## 2020-10-20 ENCOUNTER — Other Ambulatory Visit: Payer: Self-pay | Admitting: Family Medicine

## 2020-10-20 NOTE — Progress Notes (Signed)
Tuberculosis treatment orders  All patients are to be monitored per [place] and county TB policies.   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 Treat for latent TB per the following:  Rifampin 600mg  daily by mouth x 4 months, draw LFTs monthly per Dr. .  +QFT 09/26/20 CXR without Active TB 10/07/20 Close contact to Active TB  HTN Hepatic steatosis DM type 2 Hyperlipidemia

## 2020-10-24 ENCOUNTER — Ambulatory Visit (LOCAL_COMMUNITY_HEALTH_CENTER): Payer: Self-pay

## 2020-10-24 VITALS — Wt 173.0 lb

## 2020-10-24 DIAGNOSIS — R7612 Nonspecific reaction to cell mediated immunity measurement of gamma interferon antigen response without active tuberculosis: Secondary | ICD-10-CM

## 2020-10-24 NOTE — Progress Notes (Signed)
Rifampin 300mg  #60 dispensed to patient per [facility] orders.[facility], RN

## 2020-11-17 ENCOUNTER — Emergency Department: Payer: Medicare (Managed Care)

## 2020-11-17 ENCOUNTER — Encounter: Payer: Self-pay | Admitting: Emergency Medicine

## 2020-11-17 ENCOUNTER — Other Ambulatory Visit: Payer: Self-pay

## 2020-11-17 DIAGNOSIS — Z87891 Personal history of nicotine dependence: Secondary | ICD-10-CM | POA: Insufficient documentation

## 2020-11-17 DIAGNOSIS — Z7982 Long term (current) use of aspirin: Secondary | ICD-10-CM | POA: Diagnosis not present

## 2020-11-17 DIAGNOSIS — E1169 Type 2 diabetes mellitus with other specified complication: Secondary | ICD-10-CM | POA: Diagnosis not present

## 2020-11-17 DIAGNOSIS — Z7984 Long term (current) use of oral hypoglycemic drugs: Secondary | ICD-10-CM | POA: Insufficient documentation

## 2020-11-17 DIAGNOSIS — I10 Essential (primary) hypertension: Secondary | ICD-10-CM | POA: Insufficient documentation

## 2020-11-17 DIAGNOSIS — J45909 Unspecified asthma, uncomplicated: Secondary | ICD-10-CM | POA: Diagnosis not present

## 2020-11-17 DIAGNOSIS — K5900 Constipation, unspecified: Secondary | ICD-10-CM | POA: Insufficient documentation

## 2020-11-17 DIAGNOSIS — Z794 Long term (current) use of insulin: Secondary | ICD-10-CM | POA: Insufficient documentation

## 2020-11-17 DIAGNOSIS — Z79899 Other long term (current) drug therapy: Secondary | ICD-10-CM | POA: Insufficient documentation

## 2020-11-17 DIAGNOSIS — E785 Hyperlipidemia, unspecified: Secondary | ICD-10-CM | POA: Insufficient documentation

## 2020-11-17 LAB — CBC WITH DIFFERENTIAL/PLATELET
Abs Immature Granulocytes: 0.04 10*3/uL (ref 0.00–0.07)
Basophils Absolute: 0.1 10*3/uL (ref 0.0–0.1)
Basophils Relative: 1 %
Eosinophils Absolute: 0.3 10*3/uL (ref 0.0–0.5)
Eosinophils Relative: 2 %
HCT: 31.5 % — ABNORMAL LOW (ref 36.0–46.0)
Hemoglobin: 9.6 g/dL — ABNORMAL LOW (ref 12.0–15.0)
Immature Granulocytes: 0 %
Lymphocytes Relative: 22 %
Lymphs Abs: 3.1 10*3/uL (ref 0.7–4.0)
MCH: 22.7 pg — ABNORMAL LOW (ref 26.0–34.0)
MCHC: 30.5 g/dL (ref 30.0–36.0)
MCV: 74.5 fL — ABNORMAL LOW (ref 80.0–100.0)
Monocytes Absolute: 0.7 10*3/uL (ref 0.1–1.0)
Monocytes Relative: 5 %
Neutro Abs: 10.2 10*3/uL — ABNORMAL HIGH (ref 1.7–7.7)
Neutrophils Relative %: 70 %
Platelets: 296 10*3/uL (ref 150–400)
RBC: 4.23 MIL/uL (ref 3.87–5.11)
RDW: 16.5 % — ABNORMAL HIGH (ref 11.5–15.5)
WBC: 14.4 10*3/uL — ABNORMAL HIGH (ref 4.0–10.5)
nRBC: 0 % (ref 0.0–0.2)

## 2020-11-17 LAB — BASIC METABOLIC PANEL
Anion gap: 9 (ref 5–15)
BUN: 7 mg/dL — ABNORMAL LOW (ref 8–23)
CO2: 26 mmol/L (ref 22–32)
Calcium: 9.2 mg/dL (ref 8.9–10.3)
Chloride: 100 mmol/L (ref 98–111)
Creatinine, Ser: 0.77 mg/dL (ref 0.44–1.00)
GFR, Estimated: >60 mL/min (ref >60)
Glucose, Bld: 348 mg/dL — ABNORMAL HIGH (ref 70–99)
Potassium: 4.2 mmol/L (ref 3.5–5.1)
Sodium: 135 mmol/L (ref 135–145)

## 2020-11-17 LAB — HEPATIC FUNCTION PANEL
ALT: 15 U/L (ref 0–44)
AST: 45 U/L — ABNORMAL HIGH (ref 15–41)
Albumin: 3.2 g/dL — ABNORMAL LOW (ref 3.5–5.0)
Alkaline Phosphatase: 118 U/L (ref 38–126)
Bilirubin, Direct: <0.1 mg/dL (ref 0.0–0.2)
Total Bilirubin: 0.4 mg/dL (ref 0.3–1.2)
Total Protein: 7.7 g/dL (ref 6.5–8.1)

## 2020-11-17 LAB — LIPASE, BLOOD: Lipase: 41 U/L (ref 11–51)

## 2020-11-17 NOTE — ED Triage Notes (Signed)
Pt reports that she has not been able to have a BM for the last 4 to 5 days. Her husband tried to dig her out but did not have any success. She did not try any laxatives or suppositories.

## 2020-11-17 NOTE — ED Notes (Signed)
Assisted pt to restroom. Patient aware that we need urine sample for testing, unable at this time. Pt given instruction on providing urine sample when able to do so.

## 2020-11-17 NOTE — ED Notes (Signed)
First nurse- pt brought in via ems from home with rectal pain.  Pt constipated x 4 days.  Fsbs 363 per ems.  Bp123/68  pt alert.   Iv 20g lac. No meds for 2 months per ems.

## 2020-11-18 ENCOUNTER — Emergency Department
Admission: EM | Admit: 2020-11-18 | Discharge: 2020-11-18 | Disposition: A | Discharge status: Home / Self Care | Payer: Medicare (Managed Care) | Attending: Emergency Medicine | Admitting: Emergency Medicine

## 2020-11-18 DIAGNOSIS — K5900 Constipation, unspecified: Secondary | ICD-10-CM

## 2020-11-18 MED ORDER — DOCUSATE SODIUM 50 MG/5ML PO LIQD
100.0000 mg | ORAL | Status: AC
  Administered 2020-11-18: 100 mg
  Filled 2020-11-18: qty 10

## 2020-11-18 MED ORDER — LACTULOSE 10 GM/15ML PO SOLN
20.0000 g | Freq: Once | ORAL | Status: AC
  Administered 2020-11-18: 20 g via ORAL
  Filled 2020-11-18: qty 30

## 2020-11-18 MED ORDER — ONDANSETRON HCL 4 MG/2ML IJ SOLN
4.0000 mg | INTRAMUSCULAR | Status: AC
  Administered 2020-11-18: 4 mg via INTRAVENOUS
  Filled 2020-11-18: qty 2

## 2020-11-18 NOTE — ED Provider Notes (Signed)
[facility] Emergency Department Provider Note  ____________________________________________   Event Date/Time   First MD Initiated Contact with Patient 11/18/20 [phone]     (approximate)  I have reviewed the triage vital signs and the nursing notes.   HISTORY  Chief Complaint Constipation    HPI Erica Fairyatricia Housman is a 70 y.o. female with medical history as listed below who presents for evaluation of abdominal cramping and constipation.  She states she has not had a bowel movement in 4 to 5 days.  She has had trouble with constipation in the past and she usually takes stool softeners but she is out and states that no one could get any from the store for her.  She is not sure which she usually takes.  She has been having episodes of abdominal cramping that have gotten worse and tonight it was severe.  She said that she and her husband both tried to pull some stool out of her rectum and they got a little bit out but not enough.  She has had some nausea but no vomiting.  She denies fever, chest pain, shortness of breath.  Symptoms are severe and nothing in particular seems to make it better or worse but she has not tried taking anything at home.     Past Medical History:  Diagnosis Date   Arthritis    Asthma    Diabetes mellitus without complication (HCC)    Graves' disease    Hepatitis 1995   Hep C.  Treated.   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   Abdominal pain, chronic, generalized 09/20/2016   Hepatic steatosis 08/14/2016   Multiple thyroid nodules 01/12/2016   Herniation of lumbar intervertebral disc with radiculopathy 05/20/2013   Degenerative disc disease 07/18/2011   Allergic rhinitis 06/21/2011   GERD (gastroesophageal reflux disease) 04/28/2011    Glaucoma suspect with open angle 04/17/2011   Depression 12/26/2010   Vision impairment 12/26/2010   Hypertension 12/26/2010   Hyperlipidemia with target LDL less than 70 12/26/2010   H/O: substance abuse (HCC) 12/26/2010   Diabetes mellitus, type 2 (HCC) 12/26/2010   Cervical stenosis of spine 12/26/2010    Past Surgical History:  Procedure Laterality Date   ABDOMINAL HYSTERECTOMY     EYE SURGERY      Prior to Admission medications   Medication Sig Start Date End Date Taking? Authorizing Provider  acetaminophen (ACETAMINOPHEN 8 HOUR) 650 MG CR tablet Take 1,300 mg by mouth 2 (two) times daily as needed for pain.    [provider]  amLODipine (NORVASC) 10 MG tablet Take 1 tablet (10 mg total) by mouth daily. 11/13/17   Denzil Magnusonhomas, Lashunda, NP  ASPIRIN 81 PO Take by mouth daily.    [provider]  atorvastatin (LIPITOR) 80 MG tablet Take 1 tablet (80 mg total) by mouth daily. 11/13/17   Denzil Magnusonhomas, Lashunda, NP  calcium carbonate (TUMS - DOSED IN MG ELEMENTAL CALCIUM) 500 MG chewable tablet Chew 2 tablets by mouth 2 (two) times daily as needed for indigestion or heartburn.    [provider]  camphor-menthol Wynelle Fanny(SARNA) lotion Apply 1 application topically as needed for itching.    [provider]  Cholecalciferol 25 MCG (1000 UT) capsule Take 1,000 Units by mouth daily.    [provider]  clobetasol cream (  TEMOVATE) 0.05 % Apply 1 application topically 2 (two) times a week.    [provider]  clotrimazole (LOTRIMIN) 1 % cream Apply 1 application topically 2 (two) times daily.    [provider]  empagliflozin (JARDIANCE) 10 MG TABS tablet Take 10 mg by mouth daily.    [provider]  insulin degludec (TRESIBA FLEXTOUCH) 200 UNIT/ML FlexTouch Pen Inject 60 Units into the skin at bedtime.    [provider]  meloxicam (MOBIC) 7.5 MG tablet Take 1 tablet (7.5 mg total) by mouth 2 (two) times daily. 11/13/17   Denzil Magnuson, NP  metFORMIN (GLUCOPHAGE) 500 MG tablet Take 1 tablet (500 mg total) by mouth 2 (two) times daily with a meal. Patient taking differently: Take 1,000 mg by mouth 2 (two) times daily with a meal. 11/13/17   Denzil Magnuson, NP  methimazole (TAPAZOLE) 5 MG tablet Take 2.5 mg by mouth daily.    [provider]  mometasone (NASONEX) 50 MCG/ACT nasal spray Place 2 sprays into the nose daily as needed.    [provider]  nitroGLYCERIN (NITROSTAT) 0.4 MG SL tablet Place 0.4 mg under the tongue every 5 (five) minutes as needed for chest pain.    [provider]  omeprazole (PRILOSEC) 40 MG capsule Take 1 capsule by mouth daily. 04/08/17   [provider]  PARoxetine (PAXIL) 40 MG tablet Take 1 tablet (40 mg total) by mouth every morning. 11/13/17   Denzil Magnuson, NP  pregabalin (LYRICA) 100 MG capsule Take 100 mg by mouth 2 (two) times daily.    [provider]  pregabalin (LYRICA) 25 MG capsule Take 25 mg by mouth 2 (two) times daily.    [provider]  SPIRIVA HANDIHALER 18 MCG inhalation capsule Place 1 capsule into inhaler and inhale daily. 03/22/17   [provider]  traZODone (DESYREL) 50 MG tablet Take 1 tablet (50 mg total) by mouth at bedtime as needed for sleep. 11/13/17   Denzil Magnuson, NP  triamcinolone 0.5%-Eucerin equivalent 1:1 cream mixture Apply topically 2 (two) times daily.    [provider]  VENTOLIN HFA 108 (90 Base) MCG/ACT inhaler Inhale 2 puffs into the lungs every 6 (six) hours as needed. 03/26/17   [provider]    Allergies Ace inhibitors and Fluticasone  Family History  Problem Relation Age of Onset   Breast cancer Neg Hx     Social History Social History   Tobacco Use   Smoking status: Former    Packs/day: 0.50    Types: Cigarettes    Quit date: 08/11/2019    Years since quitting: 1.2   Smokeless tobacco: Never  Vaping Use   Vaping Use: Never used  Substance Use Topics    Alcohol use: No   Drug use: No    Review of Systems Constitutional: No fever/chills Eyes: No visual changes. ENT: No sore throat. Cardiovascular: Denies chest pain. Respiratory: Denies shortness of breath. Gastrointestinal: Constipation with abdominal cramping.  Nausea, no vomiting. Genitourinary: Negative for dysuria. Musculoskeletal: Negative for neck pain.  Negative for back pain. Integumentary: Negative for rash. Neurological: Negative for headaches, focal weakness or numbness.   ____________________________________________   PHYSICAL EXAM:  VITAL SIGNS: ED Triage Vitals  Enc Vitals Group     BP 11/17/20 2224 (!) 182/69     Pulse Rate 11/17/20 2224 61     Resp 11/17/20 2224 18     Temp 11/17/20 2224 98.5 F (36.9 C)  Temp Source 11/17/20 2224 Oral     SpO2 11/17/20 2224 96 %     Weight 11/17/20 2225 78 kg (172 lb)     Height 11/17/20 2225 1.651 m (5\' 5" )     Head Circumference --      Peak Flow --      Pain Score 11/17/20 2224 10     Pain Loc --      Pain Edu? --      Excl. in GC? --     Constitutional: Alert and oriented.  Appears uncomfortable. Eyes: Conjunctivae are normal.  Head: Atraumatic. Nose: No congestion/rhinnorhea. Mouth/Throat: Patient is wearing a mask. Neck: No stridor.  No meningeal signs.   Cardiovascular: Normal rate, regular rhythm. Good peripheral circulation. Respiratory: Normal respiratory effort.  No retractions. Gastrointestinal: Soft with no obvious distention.  Mild nonlocalized tenderness to palpation.  No peritonitis. Rectal: Patient declines rectal exam and manual disimpaction. Musculoskeletal: No lower extremity tenderness nor edema. No gross deformities of extremities. Neurologic:  Normal speech and language. No gross focal neurologic deficits are appreciated.  Skin:  Skin is warm, dry and intact. Psychiatric: Mood and affect are normal. Speech and behavior are normal.  ____________________________________________    LABS (all labs ordered are listed, but only abnormal results are displayed)  Labs Reviewed  CBC WITH DIFFERENTIAL/PLATELET - Abnormal; Notable for the following components:      Result Value   WBC 14.4 (*)    Hemoglobin 9.6 (*)    HCT 31.5 (*)    MCV 74.5 (*)    MCH 22.7 (*)    RDW 16.5 (*)    Neutro Abs 10.2 (*)    All other components within normal limits  BASIC METABOLIC PANEL - Abnormal; Notable for the following components:   Glucose, Bld 348 (*)    BUN 7 (*)    All other components within normal limits  HEPATIC FUNCTION PANEL - Abnormal; Notable for the following components:   Albumin 3.2 (*)    AST 45 (*)    All other components within normal limits  LIPASE, BLOOD  URINALYSIS, COMPLETE (UACMP) WITH MICROSCOPIC   ____________________________________________   RADIOLOGY I, 2225, personally viewed and evaluated these images (plain radiographs) as part of my medical decision making, as well as reviewing the written report by the radiologist.  ED MD interpretation: Large amount of stool with nonobstructive pattern  Official radiology report(s): DG Abdomen 1 View  Result Date: 11/17/2020 CLINICAL DATA:  Constipation EXAM: ABDOMEN - 1 VIEW COMPARISON:  01/09/2018 FINDINGS: Nonobstructed gas pattern with large volume of stool colon moderate retained feces at the rectum. Multiple phleboliths. No radiopaque calculi over the kidneys IMPRESSION: Nonobstructed gas pattern with large amount of stool and rectum Electronically Signed   By: 01/11/2018 M.D.   On: 11/17/2020 22:47    ____________________________________________   PROCEDURES   Procedure(s) performed (including Critical Care):  Procedures   ____________________________________________   INITIAL IMPRESSION / MDM / ASSESSMENT AND PLAN / ED COURSE  As part of my medical decision making, I reviewed the following data within the electronic MEDICAL RECORD NUMBER Nursing notes reviewed and incorporated, Labs  reviewed , Old chart reviewed, and Notes from prior ED visits   Differential diagnosis includes, but is not limited to, constipation, obstipation, SBO/ileus, acute intra-abdominal infection.  Vital signs are notable for some mild tachycardia which could be due to discomfort.  Basic metabolic panel normal other than hyperglycemia.  Lipase normal, hepatic function tests are essentially normal  other than a very slight elevation of AST which is likely not clinically significant.  CBC is notable for mild leukocytosis of 14.4 of unclear etiology.  Patient and I discussed the plan and she declines rectal exam and manual disimpaction and would prefer to try an enema.  We will try an enema first and see if any additional treatment or imaging is needed.  I also am giving her lactulose 20 g by mouth.  She agrees with the plan.       Clinical Course as of 11/18/20 0334  Fri Nov 18, 2020  0230 The patient is now vomiting after drinking the lactulose, providing Zofran 4 mg IV [CF]  0318 Patient had good success from her enema.  I went back in to reassess and she says she feels "like a new woman".  No more abdominal pain, she is laughing and joking with me, and feels completely better and wants to go home.  I told her I was considering getting a CT scan but she says she feels all better and would like to go home.  At this point I will discharge her but I gave her strict instructions to return to the ED if she develops new or worsening symptoms that she understands and agrees.  I also strongly suggested she stop by a store on the way home to pick up some Colace and/or MiraLAX and take them according to label instructions at least until she can follow-up with her PCP. [CF]    Clinical Course User Index [CF] Loleta Rose, MD     ____________________________________________  FINAL CLINICAL IMPRESSION(S) / ED DIAGNOSES  Final diagnoses:  Constipation, unspecified constipation type     MEDICATIONS GIVEN  DURING THIS VISIT:  Medications  lactulose (CHRONULAC) 10 GM/15ML solution 20 g (20 g Oral Given 11/18/20 0201)  docusate (COLACE) 50 MG/5ML liquid 100 mg (100 mg Per Tube Given 11/18/20 0236)  ondansetron (ZOFRAN) injection 4 mg (4 mg Intravenous Given 11/18/20 0233)     ED Discharge Orders     None        Note:  This document was prepared using Dragon voice recognition software and may include unintentional dictation errors.   Loleta Rose, MD 11/18/20 [phone]

## 2020-11-18 NOTE — Discharge Instructions (Addendum)
You were seen in the emergency department today for constipation.  We recommend that you use one or more of the following over-the-counter medications in the order described:   1)  Miralax (powder):  This medication works by drawing additional fluid into your intestines and helps to flush out your stool.  Mix the powder with water or juice according to label instructions.  Be sure to use the recommended amount of water or juice when you mix up the powder.  Plenty of fluids will help to prevent constipation. 2)  Colace (or Dulcolax) 100 mg:  This is a stool softener, and you may take it once or twice a day as needed. 3)  Senna tablets:  This is a bowel stimulant that will help "push" out your stool. It is the next step to add after you have tried a stool softener.  If the three options above are not working, even when you use them together, look for magnesium citrate at the pharmacy (it is usually in a small glass bottle).  Drink the bottle according to the label instructions.  You may also want to consider using glycerin suppositories, which you insert into your rectum.  You hold it in place and is dissolves and softens your stool and stimulates your bowels.  You could also consider using an enema, which is also available over the counter.  Remember that narcotic pain medications are constipating, so avoid them or minimize their use.  Drink plenty of fluids.  Please return to the Emergency Department immediately if you develop new or worsening symptoms that concern you, such as (but not limited to) fever > 101 degrees, severe abdominal pain, or persistent vomiting.  

## 2020-11-21 ENCOUNTER — Ambulatory Visit (LOCAL_COMMUNITY_HEALTH_CENTER): Payer: Medicare (Managed Care)

## 2020-11-21 VITALS — Wt 173.0 lb

## 2020-11-21 DIAGNOSIS — R7612 Nonspecific reaction to cell mediated immunity measurement of gamma interferon antigen response without active tuberculosis: Secondary | ICD-10-CM

## 2020-11-21 MED ORDER — RIFAMPIN 300 MG PO CAPS: 600.0000 mg | ORAL_CAPSULE | Freq: Every day | ORAL | 0 refills | Status: AC

## 2020-11-21 NOTE — Progress Notes (Signed)
Rifampin 300mg  #60 dispensed to patient.  Explained that patient will have 2 more months after this month of LTBI tx. This is month #2.  Patient did not come into clinic today for visit.  TB RN completed a home visit. Patient had a recent ER visit and LFTs were completed. No labs today.  See Epic for 11/17/20 LFTs; Dr. 01/17/21 made aware of this info.Alvester Morin, RN

## 2020-11-24 ENCOUNTER — Encounter: Payer: Self-pay | Admitting: Ophthalmology

## 2020-12-01 ENCOUNTER — Encounter: Admission: RE | Payer: Self-pay | Source: Home / Self Care

## 2020-12-01 ENCOUNTER — Ambulatory Visit: Admission: RE | Admit: 2020-12-01 | Payer: Medicare (Managed Care) | Source: Home / Self Care | Admitting: Ophthalmology

## 2020-12-01 SURGERY — PHACOEMULSIFICATION, CATARACT, WITH IOL INSERTION
Anesthesia: Topical | Laterality: Right

## 2020-12-22 ENCOUNTER — Other Ambulatory Visit: Payer: Self-pay

## 2020-12-22 ENCOUNTER — Ambulatory Visit: Payer: Medicare (Managed Care) | Attending: Family

## 2021-01-02 ENCOUNTER — Ambulatory Visit (LOCAL_COMMUNITY_HEALTH_CENTER): Payer: Medicare (Managed Care)

## 2021-01-02 VITALS — Wt 173.0 lb

## 2021-01-02 DIAGNOSIS — R7612 Nonspecific reaction to cell mediated immunity measurement of gamma interferon antigen response without active tuberculosis: Secondary | ICD-10-CM

## 2021-01-03 LAB — HEPATIC FUNCTION PANEL
ALT: 9 IU/L (ref 0–32)
AST: 34 IU/L (ref 0–40)
Albumin: 4.2 g/dL (ref 3.7–4.7)
Alkaline Phosphatase: 120 IU/L (ref 44–121)
Bilirubin Total: <0.2 mg/dL (ref 0.0–1.2)
Bilirubin, Direct: <0.1 mg/dL (ref 0.00–0.40)
Total Protein: 8.1 g/dL (ref 6.0–8.5)

## 2021-01-05 ENCOUNTER — Ambulatory Visit: Payer: Medicare (Managed Care)

## 2021-01-05 VITALS — Wt 173.0 lb

## 2021-01-05 DIAGNOSIS — R7612 Nonspecific reaction to cell mediated immunity measurement of gamma interferon antigen response without active tuberculosis: Secondary | ICD-10-CM

## 2021-01-05 NOTE — Progress Notes (Signed)
Patient in for LFTs and flowsheet today for LTBI  tx.  Reports in the last week she has had nausea after taking Rifampin.  Denies abdominal pain, diarrhea and unusual bruising or bleeding. Patient had recently changed Rifampin to nights d/t the nausea during the day. Will check LFTs today and TB RN will follow up with patient. No meds dispensed today.  Denies changes to any of her regular medications.  Instructed patient to pick up OTC Prilosec or Pepcid and take daily.  Encouraged her to follow up with her PCP.  States her PCP has changed recently and has had trouble getting in with her provider to review and refill regular meds.  She thinks she has an appt with [facility] the first week of November. Richmond Campbell, RN

## 2021-01-06 NOTE — Progress Notes (Signed)
Rifampin 300mg  #60 dispensed to patient at home. Had LFTs earlier this week and complained of nausea.  Patient has not taken Rifampin since last Friday and still has nausea. States she "can't stand the smell of grease". Asked if she still had her gallbladder and she doesn't know.  Is awaiting her PCP appt next week. Sunday, RN

## 2021-01-21 ENCOUNTER — Emergency Department: Payer: Medicare Other

## 2021-01-21 ENCOUNTER — Observation Stay
Admission: EM | Admit: 2021-01-21 | Discharge: 2021-01-23 | Disposition: A | Discharge status: Home / Self Care | Payer: Medicare Other | Attending: Emergency Medicine | Admitting: Emergency Medicine

## 2021-01-21 ENCOUNTER — Other Ambulatory Visit: Payer: Self-pay

## 2021-01-21 DIAGNOSIS — R079 Chest pain, unspecified: Secondary | ICD-10-CM | POA: Diagnosis present

## 2021-01-21 DIAGNOSIS — J45909 Unspecified asthma, uncomplicated: Secondary | ICD-10-CM | POA: Diagnosis not present

## 2021-01-21 DIAGNOSIS — H40009 Preglaucoma, unspecified, unspecified eye: Secondary | ICD-10-CM | POA: Diagnosis present

## 2021-01-21 DIAGNOSIS — R911 Solitary pulmonary nodule: Secondary | ICD-10-CM | POA: Insufficient documentation

## 2021-01-21 DIAGNOSIS — E1165 Type 2 diabetes mellitus with hyperglycemia: Secondary | ICD-10-CM

## 2021-01-21 DIAGNOSIS — E042 Nontoxic multinodular goiter: Secondary | ICD-10-CM | POA: Diagnosis present

## 2021-01-21 DIAGNOSIS — I700 Atherosclerosis of aorta: Secondary | ICD-10-CM | POA: Insufficient documentation

## 2021-01-21 DIAGNOSIS — Z20822 Contact with and (suspected) exposure to covid-19: Secondary | ICD-10-CM | POA: Insufficient documentation

## 2021-01-21 DIAGNOSIS — Z7982 Long term (current) use of aspirin: Secondary | ICD-10-CM | POA: Insufficient documentation

## 2021-01-21 DIAGNOSIS — E119 Type 2 diabetes mellitus without complications: Secondary | ICD-10-CM

## 2021-01-21 DIAGNOSIS — K219 Gastro-esophageal reflux disease without esophagitis: Secondary | ICD-10-CM | POA: Diagnosis present

## 2021-01-21 DIAGNOSIS — Z7984 Long term (current) use of oral hypoglycemic drugs: Secondary | ICD-10-CM | POA: Diagnosis not present

## 2021-01-21 DIAGNOSIS — Z87891 Personal history of nicotine dependence: Secondary | ICD-10-CM | POA: Insufficient documentation

## 2021-01-21 DIAGNOSIS — R0789 Other chest pain: Secondary | ICD-10-CM | POA: Diagnosis not present

## 2021-01-21 DIAGNOSIS — I10 Essential (primary) hypertension: Secondary | ICD-10-CM | POA: Diagnosis not present

## 2021-01-21 DIAGNOSIS — J449 Chronic obstructive pulmonary disease, unspecified: Secondary | ICD-10-CM

## 2021-01-21 DIAGNOSIS — D649 Anemia, unspecified: Secondary | ICD-10-CM | POA: Diagnosis not present

## 2021-01-21 DIAGNOSIS — Z79899 Other long term (current) drug therapy: Secondary | ICD-10-CM | POA: Diagnosis not present

## 2021-01-21 LAB — BASIC METABOLIC PANEL
Anion gap: 7 (ref 5–15)
BUN: 11 mg/dL (ref 8–23)
CO2: 22 mmol/L (ref 22–32)
Calcium: 8.8 mg/dL — ABNORMAL LOW (ref 8.9–10.3)
Chloride: 106 mmol/L (ref 98–111)
Creatinine, Ser: 0.67 mg/dL (ref 0.44–1.00)
GFR, Estimated: >60 mL/min (ref >60)
Glucose, Bld: 358 mg/dL — ABNORMAL HIGH (ref 70–99)
Potassium: 4.5 mmol/L (ref 3.5–5.1)
Sodium: 135 mmol/L (ref 135–145)

## 2021-01-21 LAB — CBC
HCT: 31.7 % — ABNORMAL LOW (ref 36.0–46.0)
Hemoglobin: 9.3 g/dL — ABNORMAL LOW (ref 12.0–15.0)
MCH: 22.3 pg — ABNORMAL LOW (ref 26.0–34.0)
MCHC: 29.3 g/dL — ABNORMAL LOW (ref 30.0–36.0)
MCV: 76 fL — ABNORMAL LOW (ref 80.0–100.0)
Platelets: 284 10*3/uL (ref 150–400)
RBC: 4.17 MIL/uL (ref 3.87–5.11)
RDW: 16.5 % — ABNORMAL HIGH (ref 11.5–15.5)
WBC: 11.4 10*3/uL — ABNORMAL HIGH (ref 4.0–10.5)
nRBC: 0 % (ref 0.0–0.2)

## 2021-01-21 LAB — TROPONIN I (HIGH SENSITIVITY): Troponin I (High Sensitivity): 6 ng/L (ref <18)

## 2021-01-21 LAB — RESP PANEL BY RT-PCR (FLU A&B, COVID) ARPGX2
Influenza A by PCR: NEGATIVE
Influenza B by PCR: NEGATIVE
SARS Coronavirus 2 by RT PCR: NEGATIVE

## 2021-01-21 MED ORDER — ENOXAPARIN SODIUM 40 MG/0.4ML IJ SOSY
40.0000 mg | PREFILLED_SYRINGE | INTRAMUSCULAR | Status: DC
  Administered 2021-01-21 – 2021-01-22 (×2): 40 mg via SUBCUTANEOUS
  Filled 2021-01-21 (×3): qty 0.4

## 2021-01-21 MED ORDER — ONDANSETRON HCL 4 MG/2ML IJ SOLN
4.0000 mg | Freq: Four times a day (QID) | INTRAMUSCULAR | Status: DC | PRN
  Administered 2021-01-22: 4 mg via INTRAVENOUS
  Filled 2021-01-21: qty 2

## 2021-01-21 MED ORDER — ALBUTEROL SULFATE (2.5 MG/3ML) 0.083% IN NEBU: 2.5000 mg | INHALATION_SOLUTION | Freq: Four times a day (QID) | RESPIRATORY_TRACT | Status: DC | PRN

## 2021-01-21 MED ORDER — NITROGLYCERIN 0.4 MG SL SUBL: 0.4000 mg | SUBLINGUAL_TABLET | SUBLINGUAL | Status: DC | PRN

## 2021-01-21 MED ORDER — PAROXETINE HCL 20 MG PO TABS
40.0000 mg | ORAL_TABLET | ORAL | Status: DC
  Administered 2021-01-22 – 2021-01-23 (×2): 40 mg via ORAL
  Filled 2021-01-21 (×2): qty 2

## 2021-01-21 MED ORDER — ATORVASTATIN CALCIUM 80 MG PO TABS
80.0000 mg | ORAL_TABLET | Freq: Every day | ORAL | Status: DC
  Administered 2021-01-22 – 2021-01-23 (×2): 80 mg via ORAL
  Filled 2021-01-21: qty 1
  Filled 2021-01-21: qty 4

## 2021-01-21 MED ORDER — ACETAMINOPHEN 500 MG PO TABS
1000.0000 mg | ORAL_TABLET | Freq: Once | ORAL | Status: AC
  Administered 2021-01-21: 1000 mg via ORAL
  Filled 2021-01-21: qty 2

## 2021-01-21 MED ORDER — NITROGLYCERIN 0.4 MG SL SUBL
0.4000 mg | SUBLINGUAL_TABLET | SUBLINGUAL | Status: DC | PRN
  Administered 2021-01-21: 0.4 mg via SUBLINGUAL
  Filled 2021-01-21: qty 1

## 2021-01-21 MED ORDER — ACETAMINOPHEN 325 MG PO TABS
650.0000 mg | ORAL_TABLET | ORAL | Status: DC | PRN
  Administered 2021-01-22: 650 mg via ORAL
  Filled 2021-01-21: qty 2

## 2021-01-21 MED ORDER — INSULIN ASPART 100 UNIT/ML IJ SOLN
0.0000 [IU] | Freq: Three times a day (TID) | INTRAMUSCULAR | Status: DC
  Administered 2021-01-22 (×2): 3 [IU] via SUBCUTANEOUS
  Administered 2021-01-22: 5 [IU] via SUBCUTANEOUS
  Administered 2021-01-23 (×2): 3 [IU] via SUBCUTANEOUS
  Filled 2021-01-21 (×5): qty 1

## 2021-01-21 MED ORDER — METHIMAZOLE 5 MG PO TABS
2.5000 mg | ORAL_TABLET | Freq: Every day | ORAL | Status: DC
  Administered 2021-01-22 – 2021-01-23 (×2): 2.5 mg via ORAL
  Filled 2021-01-21 (×3): qty 1

## 2021-01-21 MED ORDER — PANTOPRAZOLE SODIUM 40 MG PO TBEC
40.0000 mg | DELAYED_RELEASE_TABLET | Freq: Every day | ORAL | Status: DC
  Administered 2021-01-22 – 2021-01-23 (×2): 40 mg via ORAL
  Filled 2021-01-21 (×2): qty 1

## 2021-01-21 MED ORDER — TIOTROPIUM BROMIDE MONOHYDRATE 18 MCG IN CAPS
1.0000 | ORAL_CAPSULE | Freq: Every day | RESPIRATORY_TRACT | Status: DC
  Administered 2021-01-22 – 2021-01-23 (×2): 18 ug via RESPIRATORY_TRACT
  Filled 2021-01-21: qty 5

## 2021-01-21 MED ORDER — ATORVASTATIN CALCIUM 20 MG PO TABS: 40.0000 mg | ORAL_TABLET | Freq: Every day | ORAL | Status: DC

## 2021-01-21 MED ORDER — ASPIRIN 81 MG PO CHEW
81.0000 mg | CHEWABLE_TABLET | Freq: Every day | ORAL | Status: DC
  Administered 2021-01-22 – 2021-01-23 (×2): 81 mg via ORAL
  Filled 2021-01-21 (×2): qty 1

## 2021-01-21 MED ORDER — AMLODIPINE BESYLATE 10 MG PO TABS
10.0000 mg | ORAL_TABLET | Freq: Every day | ORAL | Status: DC
  Administered 2021-01-22 – 2021-01-23 (×2): 10 mg via ORAL
  Filled 2021-01-21: qty 1
  Filled 2021-01-21: qty 2

## 2021-01-21 MED ORDER — FLUTICASONE PROPIONATE 50 MCG/ACT NA SUSP: 1.0000 | Freq: Every day | NASAL | Status: DC

## 2021-01-21 MED ORDER — TRAZODONE HCL 50 MG PO TABS
50.0000 mg | ORAL_TABLET | Freq: Every evening | ORAL | Status: DC | PRN
  Filled 2021-01-21: qty 1

## 2021-01-21 MED ORDER — IOHEXOL 350 MG/ML SOLN
75.0000 mL | Freq: Once | INTRAVENOUS | Status: AC | PRN
  Administered 2021-01-21: 75 mL via INTRAVENOUS

## 2021-01-21 NOTE — H&P (Signed)
History and Physical    Erica Estrada [id number] DOB: Sep 19, 1950 DOA: 01/21/2021  PCP: Bunnie Pion, FNP    Patient coming from:  Home   Chief Complaint:  Chest pain   HPI:  Erica Estrada is a 70 y.o. female seen in ed with complaints of chest pain that started today at about 9:00 patient reports the pain is 10/10, like a tightness, it does radiate to the left arm, she did have shortness of breath and it is a sharp pain intermittently that is worse with exertion and better with rest.  Patient had sublingual nitroglycerin that she took that helped her pain. Patient otherwise denies any palpitations, orthopnea, blurred vision headaches dizziness speech or gait issues abdominal pain fevers chills bowel or bladder complaints.  Patient took 324 mg of aspirin today.  Chart review shows that patient was seen by Dr. Clayborn Bigness in 2019 October for cardiology.  I do not see echocardiograms cardiac caths or any procedures or stress tests in our chart review system.  Pt has past medical history of past medical history of diabetes mellitus type 2, hypertension, asthma, arthritis, Graves' disease, GERD, glaucoma, hyperlipidemia, thyroid nodules.  ED Course: In the emergency room patient is afebrile hypertensive oxygenating 100% on room air. Vitals:   01/21/21 2200 01/21/21 2202 01/21/21 2300 01/22/21 0000  BP: 131/67  137/82 135/66  Pulse: 87 87 84 85  Resp: 11 16 12 18   Temp:      TempSrc:      SpO2: 98% 98% 98% 100%  Weight:      Height:      Blood work shows hyperglycemia with a glucose of 358, normal creatinine at 0.67 with a GFR of more than 60, CBC shows white count of 11.4 hemoglobin of 9.3 MCV of 76 RDW of 16.5 platelets of 284.  Initial troponin of 6.  Respiratory panel pending.  Chart review shows anemia as far back as 2019 February when the patient's hemoglobin was 11.8.  Patient received 1 g of Tylenol, sublingual nitroglycerin.  And is currently in CT angio of the  chest. EKG sinus rhythm 85 with no ST-T wave changes.  Q wave in lead III and T wave inversion in lead III.  Chest x-ray is negative.   Review of Systems:  Review of Systems  Constitutional:  Positive for malaise/fatigue.  Cardiovascular:  Positive for chest pain.  All other systems reviewed and are negative.   Past Medical History:  Diagnosis Date   Arthritis    Asthma    Diabetes mellitus without complication ([place])    Graves' disease    Hepatitis 1995   Hep C.  Treated.   Hypertension     Past Surgical History:  Procedure Laterality Date   ABDOMINAL HYSTERECTOMY     EYE SURGERY       reports that she quit smoking about 17 months ago. Her smoking use included cigarettes. She smoked an average of .5 packs per day. She has never used smokeless tobacco. She reports that she does not drink alcohol and does not use drugs.  Allergies  Allergen Reactions   Ace Inhibitors Cough    Chronic cough for 3 months. Trial off lisinopril starting 05/11/2011 effective in stopping cough.   Fluticasone Other (See Comments)    choking    Family History  Problem Relation Age of Onset   Breast cancer Neg Hx     Prior to Admission medications   Medication Sig Start Date End Date Taking? Authorizing Provider  acetaminophen (TYLENOL) 650 MG CR tablet Take 1,300 mg by mouth 2 (two) times daily as needed for pain.    [provider]  amLODipine (NORVASC) 10 MG tablet Take 1 tablet (10 mg total) by mouth daily. 11/13/17   Denzil Magnuson, NP  Artificial Saliva (BIOTENE MOISTURIZING MOUTH MT) Use as directed 1-2 sprays in the mouth or throat 4 (four) times daily as needed.    [provider]  ASPERCREME LIDOCAINE 4 % CREA Apply 1 application topically 2 (two) times daily. Apply to back and knees prn for pain    [provider]  ASPIRIN 81 PO Take by mouth daily.    [provider]  atorvastatin (LIPITOR) 80 MG tablet Take 1 tablet (80 mg total) by mouth daily.  11/13/17   Denzil Magnuson, NP  calcium carbonate (TUMS - DOSED IN MG ELEMENTAL CALCIUM) 500 MG chewable tablet Chew 2 tablets by mouth 2 (two) times daily as needed for indigestion or heartburn.    [provider]  camphor-menthol Wynelle Fanny) lotion Apply 1 application topically as needed for itching.    [provider]  chlorhexidine (PERIDEX) 0.12 % solution Use as directed 15 mLs in the mouth or throat 2 (two) times daily. Swish for 30 seconds then spit    [provider]  Cholecalciferol 25 MCG (1000 UT) capsule Take 1,000 Units by mouth daily.    [provider]  clobetasol cream (TEMOVATE) 0.05 % Apply 1 application topically 2 (two) times a week. Apply to vulva    [provider]  clotrimazole (LOTRIMIN) 1 % cream Apply 1 application topically 2 (two) times daily.    [provider]  empagliflozin (JARDIANCE) 10 MG TABS tablet Take 10 mg by mouth daily.    [provider]  insulin degludec (TRESIBA FLEXTOUCH) 200 UNIT/ML FlexTouch Pen Inject 60 Units into the skin at bedtime.    [provider]  meloxicam (MOBIC) 7.5 MG tablet Take 1 tablet (7.5 mg total) by mouth 2 (two) times daily. 11/13/17   Denzil Magnuson, NP  metFORMIN (GLUCOPHAGE) 500 MG tablet Take 1 tablet (500 mg total) by mouth 2 (two) times daily with a meal. Patient not taking: Reported on 11/24/2020 11/13/17   Denzil Magnuson, NP  metFORMIN (GLUMETZA) 1000 MG (MOD) 24 hr tablet Take 1,000 mg by mouth 2 (two) times daily with a meal.    [provider]  methimazole (TAPAZOLE) 5 MG tablet Take 2.5 mg by mouth daily.    [provider]  mometasone (NASONEX) 50 MCG/ACT nasal spray Place 2 sprays into the nose daily as needed.    [provider]  nitroGLYCERIN (NITROSTAT) 0.4 MG SL tablet Place 0.4 mg under the tongue every 5 (five) minutes as needed for chest pain.    [provider]  omeprazole (PRILOSEC) 40 MG capsule Take 1  capsule by mouth daily. 04/08/17   [provider]  PARoxetine (PAXIL) 40 MG tablet Take 1 tablet (40 mg total) by mouth every morning. 11/13/17   Denzil Magnuson, NP  pregabalin (LYRICA) 100 MG capsule Take 25 mg by mouth 2 (two) times daily. Patient not taking: Reported on 11/24/2020    [provider]  pregabalin (LYRICA) 100 MG capsule Take 100 mg by mouth 2 (two) times daily. Patient not taking: Reported on 11/24/2020    [provider]  pregabalin (LYRICA) 50 MG capsule Take 50 mg by mouth daily. In the afternoon for neuropathy    [provider]  SPIRIVA HANDIHALER 18 MCG inhalation capsule Place 1 capsule into inhaler and inhale daily. 03/22/17   [provider]  traZODone (DESYREL) 50 MG tablet Take 1 tablet (50 mg total) by mouth at bedtime as needed for sleep. 11/13/17   Mordecai Maes, NP  triamcinolone 0.5%-Eucerin equivalent 1:1 cream mixture Apply topically 2 (two) times daily.    [provider]  Vaginal Lubricant (REPLENS) GEL Place 1 application vaginally 3 (three) times daily as needed. Apply to vagina and vulva as needed for itching. Use on days that you do not use clobetasol    [provider]  VENTOLIN HFA 108 (90 Base) MCG/ACT inhaler Inhale 2 puffs into the lungs every 6 (six) hours as needed. 03/26/17   [provider]    Physical Exam: Vitals:   01/21/21 2200 01/21/21 2202 01/21/21 2300 01/22/21 0000  BP: 131/67  137/82 135/66  Pulse: 87 87 84 85  Resp: 11 16 12 18   Temp:      TempSrc:      SpO2: 98% 98% 98% 100%  Weight:      Height:       Physical Exam Vitals and nursing note reviewed.  Constitutional:      General: She is not in acute distress.    Appearance: She is not ill-appearing, toxic-appearing or diaphoretic.  HENT:     Head: Normocephalic and atraumatic.     Right Ear: External ear normal.     Left Ear: External ear normal.     Nose: Nose normal.     Mouth/Throat:     Mouth:  Mucous membranes are dry.  Eyes:     Extraocular Movements: Extraocular movements intact.     Pupils: Pupils are equal, round, and reactive to light.     Comments: Left eye blindness  Neck:     Vascular: No carotid bruit.  Cardiovascular:     Rate and Rhythm: Normal rate and regular rhythm.     Pulses: Normal pulses.     Heart sounds: Normal heart sounds.  Pulmonary:     Effort: Pulmonary effort is normal.     Breath sounds: Normal breath sounds.  Abdominal:     General: Bowel sounds are normal. There is no distension.     Palpations: Abdomen is soft. There is no mass.     Tenderness: There is no abdominal tenderness. There is no guarding.     Hernia: No hernia is present.  Skin:    General: Skin is warm and dry.  Neurological:     General: No focal deficit present.     Mental Status: She is alert and oriented to person, place, and time.     Cranial Nerves: No cranial nerve deficit.     Motor: No weakness.  Psychiatric:        Mood and Affect: Mood normal.        Behavior: Behavior normal.    Labs on Admission: I have personally reviewed following labs and imaging studies  No results for input(s): CKTOTAL, CKMB, TROPONINI in the last 72 hours. Lab Results  Component Value Date   WBC 11.4 (H) 01/21/2021   HGB 9.3 (L) 01/21/2021   HCT 31.7 (L) 01/21/2021   MCV 76.0 (L) 01/21/2021   PLT 284 01/21/2021    Recent Labs  Lab 01/21/21 1203  NA 135  K 4.5  CL 106  CO2 22  BUN 11  CREATININE 0.67  CALCIUM 8.8*  GLUCOSE 358*   No results found  for: CHOL, HDL, LDLCALC, TRIG No results found for: DDIMER Invalid input(s): POCBNP   COVID-19 Labs No results for input(s): DDIMER, FERRITIN, LDH, CRP in the last 72 hours. Lab Results  Component Value Date   [place] NEGATIVE 01/21/2021    Radiological Exams on Admission: DG Chest 2 View  Result Date: 01/21/2021 CLINICAL DATA:  Sudden onset chest pain. EXAM: CHEST - 2 VIEW COMPARISON:  Chest x-ray dated October 07, 2020. FINDINGS: The heart size and mediastinal contours are within normal limits. Both lungs are clear. The visualized skeletal structures are unremarkable. IMPRESSION: No active cardiopulmonary disease. Electronically Signed   By: Titus Dubin M.D.   On: 01/21/2021 13:06   CT Angio Chest PE W and/or Wo Contrast  Result Date: 01/21/2021 CLINICAL DATA:  Chest pain EXAM: CT ANGIOGRAPHY CHEST WITH CONTRAST TECHNIQUE: Multidetector CT imaging of the chest was performed using the standard protocol during bolus administration of intravenous contrast. Multiplanar CT image reconstructions and MIPs were obtained to evaluate the vascular anatomy. CONTRAST:  33mL OMNIPAQUE IOHEXOL 350 MG/ML SOLN COMPARISON:  None. FINDINGS: Cardiovascular: Adequate contrast opacification of the pulmonary arteries. No evidence pulmonary embolus. Normal heart size. Three-vessel coronary artery calcifications. Atherosclerotic disease of the thoracic aorta. Mediastinum/Nodes: Enlarged heterogeneous thyroid. Esophagus is unremarkable. No pathologically enlarged lymph nodes seen in the chest. Lungs/Pleura: Central airways are patent. Mild bibasilar atelectasis. No consolidation, pleural effusion or pneumothorax. Juxtapleural nodule of the right upper lobe measuring 4 mm series 6, image 32. Upper Abdomen: Cholelithiasis.  No acute abnormality. Musculoskeletal: No chest wall abnormality. No acute or significant osseous findings. Review of the MIP images confirms the above findings. IMPRESSION: 1. No evidence of pulmonary embolus. 2. No acute lung parenchymal abnormality. 3. Three-vessel coronary artery calcifications, recommend ASCVD risk assessment. 4. Small solid pulmonary nodule of the right upper lobe measuring 4 mm. No follow-up needed if patient is low-risk. Non-contrast chest CT can be considered in 12 months if patient is high-risk. This recommendation follows the consensus statement: Guidelines for Management of Incidental Pulmonary  Nodules Detected on CT Images: From the Fleischner Society 2017; Radiology 2017; 284:228-243. 5.  Aortic Atherosclerosis (ICD10-I70.0). Electronically Signed   By: Yetta Glassman M.D.   On: 01/21/2021 19:59    EKG: Independently reviewed.  SR 85 with twi in lead III.   Assessment/Plan: Patient is a 70 year old African-American female presenting with acute onset of chest pain. Principal Problem:   Chest pain Active Problems:   Anemia   Hypertension   GERD (gastroesophageal reflux disease)   Multiple thyroid nodules   Diabetes mellitus, type 2 (HCC)   Glaucoma suspect with open angle Chest pain: Differentials include unstable angina, PE, PUD, GERD. Will await CT scan of the chest. Will also obtain a Lexiscan and echocardiogram. As needed nitroglycerin, lipid panel in am. As needed morphine for pain. Patient has ACE inhibitor allergy and therefore cannot get ACE or ARB. GI prophylaxis with IV PPI. Cardiology consult for a.m. team  as deemed appropriate. Supportive care with antiemetics antipyretics and nausea medications. Supplemental oxygen as needed. We will continue patient on oxygen, aspirin, amlodipine, Lipitor 80, as needed sublingual nitro.   Anemia: Chart review shows patient is anemic from April 12, 2017 till current date. Patient's hemoglobin today is 9.3. Will obtain anemia panel, IV PPI, type and screen, stool guaiac. I suspect patient's anemia also is contributing to her anginal symptoms secondary to increased demand. We will therefore evaluate and correct.    Hypertension: Blood pressure 135/66, pulse 85,  temperature 98.6 F (37 C), temperature source Oral, resp. rate 18, height 5\' 5"  (1.651 m), weight 73.9 kg, SpO2 100 %. We will continue patient on amlodipine.  GERD: IV PPI therapy.  Multiple thyroid nodules: We will check a free T4 and TSH and continue methimazole per home regimen.   Diabetes mellitus type 2: Regimen with insulin and metformin and  Jardiance held. We will continue patient on sliding scale insulin regimen with glycemic protocol.   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 Once med rec is available we will resume her home eyedrops.  DVT prophylaxis:  Heparin    Code Status:  Full code    Family Communication:  Brunetta Genera (Daughter)  [phone] (Mobile)   Disposition Plan:  Home    Consults called:  None   Admission status: Inpatient.    Para Skeans MD [facility] [phone] How to contact the [facility] Attending or Consulting provider [place] or covering provider during after hours [place], for this patient.    Check the care team in [facility] and look for a) attending/consulting [facility] provider listed and b) the [facility] team listed Log into [email or web address] and use [place]'s universal password to access. If you do not have the password, please contact the hospital operator. Locate the [facility] provider you are looking for under [facility] and page to a number that you can be directly reached. If you still have difficulty reaching the provider, please page the [facility] (Director on Call) for the Hospitalists listed on amion for assistance. [email or web address] Password [email or web address] 01/22/2021, 1:24 AM

## 2021-01-21 NOTE — ED Triage Notes (Signed)
Pt arrives via EMS from home for CP- pt having pain in her L chest into her back and down her L arm- pt states it started around 9 this AM

## 2021-01-21 NOTE — ED Notes (Signed)
Second troponin collected late - drawn from IV upon arrival to room 26 from lobby.

## 2021-01-21 NOTE — ED Triage Notes (Signed)
Pt in via EMS from home with c/o CP starting suddenly stabbing and sharp in nature radiating to left arm. 143/60, pt took 1 nitro prior to EMS arrival. FSBS 540, HR 81, pt was given 324 asa at 10:57.

## 2021-01-21 NOTE — ED Provider Notes (Signed)
Emergency Medicine Provider Triage Evaluation Note  Erica Estrada , a 70 y.o. female  was evaluated in triage.  Pt complains of left-sided chest pain.  Patient states that she has had a pain radiating in her front to posterior left chest.  It is sharp, occasionally radiates into the arm.  No cardiac history.  She states that this started after becoming involved in an argument with her daughter.  No symptoms precipitating, no URI symptoms.  No shortness of breath.  Review of Systems  Positive: Left-sided chest pain Negative: URI, fever, chills, shortness of breath, abdominal complaints  Physical Exam  BP (!) 142/74 (BP Location: Right Arm)   Pulse 90   Temp 98.6 F (37 C) (Oral)   Resp 20   Ht 5\' 5"  (1.651 m)   Wt 73.9 kg   SpO2 97%   BMI 27.12 kg/m  Gen:   Awake, no distress   Resp:  Normal effort  MSK:   Moves extremities without difficulty  Other:    Medical Decision Making  Medically screening exam initiated at 12:11 PM.  Appropriate orders placed.  Erica Estrada was informed that the remainder of the evaluation will be completed by another provider, this initial triage assessment does not replace that evaluation, and the importance of remaining in the ED until their evaluation is complete.  Patient arrives with left-sided chest pain x3 hours.  This started after becoming involved in an argument with her daughter.  Patient will have labs, chest x-ray, EKG   Erica Estrada 01/21/21 1211    13/12/22, MD 01/21/21 [phone]

## 2021-01-21 NOTE — ED Provider Notes (Signed)
[facility] Emergency Department Provider Note  ____________________________________________  Time seen: Approximately 7:25 PM  I have reviewed the triage vital signs and the nursing notes.   HISTORY  Chief Complaint Chest Pain    HPI Erica Estrada is a 70 y.o. female with a history of asthma, diabetes, hypertension who comes ED complaining of chest pain that started this morning at about 9:00 AM.  Feels like tightness.  Radiates to the left arm.  Associated with shortness of breath.  Feels sharp.  Worse with exertion, better with rest.  Tried a nitroglycerin at home which did temporarily help a little bit but describes it at 10/10 at its worst.  She had 324 mg of aspirin earlier today.  Denies palpitations or syncope.  She does endorse a pleuritic component of the chest pain.    Past Medical History:  Diagnosis Date   Arthritis    Asthma    Diabetes mellitus without complication (HCC)    Graves' disease    Hepatitis 1995   Hep C.  Treated.   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   Posttraumatic stress disorder 12/23/2018   Severe recurrent major depression without psychotic features (HCC) 11/10/2017   Abdominal pain, chronic, generalized 09/20/2016   Hepatic steatosis 08/14/2016   Multiple thyroid nodules 01/12/2016   Herniation of lumbar intervertebral disc with radiculopathy 05/20/2013   Degenerative disc disease 07/18/2011   Allergic rhinitis 06/21/2011   GERD (gastroesophageal reflux disease) 04/28/2011   Glaucoma suspect with open angle 04/17/2011   Depression 12/26/2010   Vision impairment 12/26/2010   Hypertension 12/26/2010   Hyperlipidemia with target LDL less than 70 12/26/2010   H/O: substance abuse (HCC) 12/26/2010   Diabetes mellitus, type 2 (HCC) 12/26/2010   Cervical stenosis of spine 12/26/2010     Past Surgical History:  Procedure  Laterality Date   ABDOMINAL HYSTERECTOMY     EYE SURGERY       Prior to Admission medications   Medication Sig Start Date End Date Taking? Authorizing Provider  acetaminophen (TYLENOL) 650 MG CR tablet Take 1,300 mg by mouth 2 (two) times daily as needed for pain.    [provider]  amLODipine (NORVASC) 10 MG tablet Take 1 tablet (10 mg total) by mouth daily. 11/13/17   Denzil Magnuson, NP  Artificial Saliva (BIOTENE MOISTURIZING MOUTH MT) Use as directed 1-2 sprays in the mouth or throat 4 (four) times daily as needed.    [provider]  ASPERCREME LIDOCAINE 4 % CREA Apply 1 application topically 2 (two) times daily. Apply to back and knees prn for pain    [provider]  ASPIRIN 81 PO Take by mouth daily.    [provider]  atorvastatin (LIPITOR) 80 MG tablet Take 1 tablet (80 mg total) by mouth daily. 11/13/17   Denzil Magnuson, NP  calcium carbonate (TUMS - DOSED IN MG ELEMENTAL CALCIUM) 500 MG chewable tablet Chew 2 tablets by mouth 2 (two) times daily as needed for indigestion or heartburn.    [provider]  camphor-menthol Wynelle Fanny) lotion Apply 1 application topically as needed for itching.    [provider]  chlorhexidine (PERIDEX) 0.12 % solution Use as directed 15 mLs in the mouth or throat 2 (two) times daily. Swish for 30 seconds then spit    [provider]  Cholecalciferol 25 MCG (1000 UT) capsule Take 1,000 Units by mouth  daily.    [provider]  clobetasol cream (TEMOVATE) 0.05 % Apply 1 application topically 2 (two) times a week. Apply to vulva    [provider]  clotrimazole (LOTRIMIN) 1 % cream Apply 1 application topically 2 (two) times daily.    [provider]  empagliflozin (JARDIANCE) 10 MG TABS tablet Take 10 mg by mouth daily.    [provider]  insulin degludec (TRESIBA FLEXTOUCH) 200 UNIT/ML FlexTouch Pen Inject 60 Units into the skin at bedtime.    [provider]  meloxicam (MOBIC) 7.5 MG tablet Take 1 tablet (7.5 mg total) by mouth 2 (two) times daily. 11/13/17   Denzil Magnuson, NP  metFORMIN (GLUCOPHAGE) 500 MG tablet Take 1 tablet (500 mg total) by mouth 2 (two) times daily with a meal. Patient not taking: Reported on 11/24/2020 11/13/17   Denzil Magnuson, NP  metFORMIN (GLUMETZA) 1000 MG (MOD) 24 hr tablet Take 1,000 mg by mouth 2 (two) times daily with a meal.    [provider]  methimazole (TAPAZOLE) 5 MG tablet Take 2.5 mg by mouth daily.    [provider]  mometasone (NASONEX) 50 MCG/ACT nasal spray Place 2 sprays into the nose daily as needed.    [provider]  nitroGLYCERIN (NITROSTAT) 0.4 MG SL tablet Place 0.4 mg under the tongue every 5 (five) minutes as needed for chest pain.    [provider]  omeprazole (PRILOSEC) 40 MG capsule Take 1 capsule by mouth daily. 04/08/17   [provider]  PARoxetine (PAXIL) 40 MG tablet Take 1 tablet (40 mg total) by mouth every morning. 11/13/17   Denzil Magnuson, NP  pregabalin (LYRICA) 100 MG capsule Take 25 mg by mouth 2 (two) times daily. Patient not taking: Reported on 11/24/2020    [provider]  pregabalin (LYRICA) 100 MG capsule Take 100 mg by mouth 2 (two) times daily. Patient not taking: Reported on 11/24/2020    [provider]  pregabalin (LYRICA) 50 MG capsule Take 50 mg by mouth daily. In the afternoon for neuropathy    [provider]  SPIRIVA HANDIHALER 18 MCG inhalation capsule Place 1 capsule into inhaler and inhale daily. 03/22/17   [provider]  traZODone (DESYREL) 50 MG tablet Take 1 tablet (50 mg total) by mouth at bedtime as needed for sleep. 11/13/17   Denzil Magnuson, NP  triamcinolone 0.5%-Eucerin equivalent 1:1 cream mixture Apply topically 2 (two) times daily.    [provider]  Vaginal Lubricant (REPLENS) GEL Place 1 application vaginally 3 (three) times daily as needed.  Apply to vagina and vulva as needed for itching. Use on days that you do not use clobetasol    [provider]  VENTOLIN HFA 108 (90 Base) MCG/ACT inhaler Inhale 2 puffs into the lungs every 6 (six) hours as needed. 03/26/17   [provider]     Allergies Ace inhibitors and Fluticasone   Family History  Problem Relation Age of Onset   Breast cancer Neg Hx     Social History Social History   Tobacco Use   Smoking status: Former    Packs/day: 0.50    Types: Cigarettes    Quit date: 08/11/2019    Years since quitting: 1.4   Smokeless tobacco: Never  Vaping Use   Vaping Use: Never used  Substance Use Topics   Alcohol use: No   Drug use: No    Review of Systems  Constitutional:   No fever  or chills.  ENT:   No sore throat. No rhinorrhea. Cardiovascular:   Positive chest pain as above without syncope. Respiratory: Positive shortness of breath without cough. Gastrointestinal:   Negative for abdominal pain, vomiting and diarrhea.  Musculoskeletal:   Negative for focal pain or swelling All other systems reviewed and are negative except as documented above in ROS and HPI.  ____________________________________________   PHYSICAL EXAM:  VITAL SIGNS: ED Triage Vitals  Enc Vitals Group     BP 01/21/21 1201 (!) 142/74     Pulse Rate 01/21/21 1201 90     Resp 01/21/21 1201 20     Temp 01/21/21 1204 98.6 F (37 C)     Temp Source 01/21/21 1204 Oral     SpO2 01/21/21 1201 97 %     Weight 01/21/21 1202 163 lb (73.9 kg)     Height 01/21/21 1202 5\' 5"  (1.651 m)     Head Circumference --      Peak Flow --      Pain Score 01/21/21 1201 10     Pain Loc --      Pain Edu? --      Excl. in GC? --     Vital signs reviewed, nursing assessments reviewed.   Constitutional:   Alert and oriented. Non-toxic appearance. Eyes:   Conjunctivae are normal. EOMI. PERRL. ENT      Head:   Normocephalic and atraumatic.      Nose:   Wearing a mask.      Mouth/Throat:    Wearing a mask.      Neck:   No meningismus. Full ROM. Hematological/Lymphatic/Immunilogical:   No cervical lymphadenopathy. Cardiovascular:   RRR. Symmetric bilateral radial and DP pulses.  No murmurs. Cap refill less than 2 seconds. Respiratory:   Normal respiratory effort without tachypnea/retractions. Breath sounds are clear and equal bilaterally. No wheezes/rales/rhonchi. Gastrointestinal:   Soft and nontender. Non distended. There is no CVA tenderness.  No rebound, rigidity, or guarding. Genitourinary:   deferred Musculoskeletal:   Normal range of motion in all extremities. No joint effusions.  No lower extremity tenderness.  No edema. Neurologic:   Normal speech and language.  Motor grossly intact. No acute focal neurologic deficits are appreciated.  Skin:    Skin is warm, dry and intact. No rash noted.  No petechiae, purpura, or bullae.  ____________________________________________    LABS (pertinent positives/negatives) (all labs ordered are listed, but only abnormal results are displayed) Labs Reviewed  BASIC METABOLIC PANEL - Abnormal; Notable for the following components:      Result Value   Glucose, Bld 358 (*)    Calcium 8.8 (*)    All other components within normal limits  CBC - Abnormal; Notable for the following components:   WBC 11.4 (*)    Hemoglobin 9.3 (*)    HCT 31.7 (*)    MCV 76.0 (*)    MCH 22.3 (*)    MCHC 29.3 (*)    RDW 16.5 (*)    All other components within normal limits  RESP PANEL BY RT-PCR (FLU A&B, COVID) ARPGX2  TROPONIN I (HIGH SENSITIVITY)  TROPONIN I (HIGH SENSITIVITY)   ____________________________________________   EKG  Interpreted by me Normal sinus rhythm rate of 85.  Normal axis and intervals.  Normal QRS and ST segments.  There is a Q wave in isolated T wave inversion in lead III, new since December 02, 2018.  Possibly indicative of right heart strain.  ____________________________________________  RADIOLOGY  DG Chest 2  View  Result Date: 01/21/2021 CLINICAL DATA:  Sudden onset chest pain. EXAM: CHEST - 2 VIEW COMPARISON:  Chest x-ray dated October 07, 2020. FINDINGS: The heart size and mediastinal contours are within normal limits. Both lungs are clear. The visualized skeletal structures are unremarkable. IMPRESSION: No active cardiopulmonary disease. Electronically Signed   By: Obie Dredge M.D.   On: 01/21/2021 13:06    ____________________________________________   PROCEDURES Procedures  ____________________________________________  DIFFERENTIAL DIAGNOSIS   Angina pectoris, non-STEMI, pulmonary embolism, pneumonia, pulmonary edema, pleural effusion  CLINICAL IMPRESSION / ASSESSMENT AND PLAN / ED COURSE  Medications ordered in the ED: Medications  nitroGLYCERIN (NITROSTAT) SL tablet 0.4 mg (0.4 mg Sublingual Given 01/21/21 1905)  acetaminophen (TYLENOL) tablet 1,000 mg (1,000 mg Oral Given 01/21/21 1904)    Pertinent labs & imaging results that were available during my care of the patient were reviewed by me and considered in my medical decision making (see chart for details).  Erica Estrada was evaluated in Emergency Department on 01/21/2021 for the symptoms described in the history of present illness. She was evaluated in the context of the global COVID-19 pandemic, which necessitated consideration that the patient might be at risk for infection with the SARS-CoV-2 virus that causes COVID-19. Institutional protocols and algorithms that pertain to the evaluation of patients at risk for COVID-19 are in a state of rapid change based on information released by regulatory bodies including the CDC and federal and state organizations. These policies and algorithms were followed during the patient's care in the ED.   Patient presents with exertional chest pain that is been recurrent since early this morning.  Initial EKG is nonischemic, chest x-ray unremarkable, initial labs unremarkable.  With her age  and comorbidities, or exertional chest pain, will need to observe overnight in the hospital for additional cardiac work-up and telemetry monitoring.  We will also obtain a CT angiogram of the chest to evaluate for PE given the EKG pattern and nature of symptoms.      ____________________________________________   FINAL CLINICAL IMPRESSION(S) / ED DIAGNOSES    Final diagnoses:  Chest pain with moderate risk for cardiac etiology  Type 2 diabetes mellitus with hyperglycemia, without long-term current use of insulin (HCC)  Chronic obstructive pulmonary disease, unspecified COPD type [facility])     ED Discharge Orders     None       Portions of this note were generated with dragon dictation software. Dictation errors may occur despite best attempts at proofreading.    Sharman Cheek, MD 01/21/21 [phone]

## 2021-01-22 ENCOUNTER — Other Ambulatory Visit: Payer: Self-pay

## 2021-01-22 ENCOUNTER — Observation Stay
Admit: 2021-01-22 | Discharge: 2021-01-22 | Disposition: A | Discharge status: Home / Self Care | Payer: Medicare Other | Attending: Internal Medicine | Admitting: Internal Medicine

## 2021-01-22 DIAGNOSIS — E119 Type 2 diabetes mellitus without complications: Secondary | ICD-10-CM | POA: Diagnosis not present

## 2021-01-22 DIAGNOSIS — I10 Essential (primary) hypertension: Secondary | ICD-10-CM | POA: Diagnosis not present

## 2021-01-22 DIAGNOSIS — D649 Anemia, unspecified: Secondary | ICD-10-CM | POA: Diagnosis not present

## 2021-01-22 DIAGNOSIS — E042 Nontoxic multinodular goiter: Secondary | ICD-10-CM

## 2021-01-22 DIAGNOSIS — R079 Chest pain, unspecified: Secondary | ICD-10-CM | POA: Diagnosis not present

## 2021-01-22 LAB — CBG MONITORING, ED
Glucose-Capillary: 159 mg/dL — ABNORMAL HIGH (ref 70–99)
Glucose-Capillary: 199 mg/dL — ABNORMAL HIGH (ref 70–99)
Glucose-Capillary: 201 mg/dL — ABNORMAL HIGH (ref 70–99)
Glucose-Capillary: 239 mg/dL — ABNORMAL HIGH (ref 70–99)
Glucose-Capillary: 265 mg/dL — ABNORMAL HIGH (ref 70–99)

## 2021-01-22 LAB — LIPID PANEL
Cholesterol: 144 mg/dL (ref 0–200)
HDL: 47 mg/dL (ref >40)
LDL Cholesterol: 79 mg/dL (ref 0–99)
Total CHOL/HDL Ratio: 3.1 RATIO
Triglycerides: 92 mg/dL (ref <150)
VLDL: 18 mg/dL (ref 0–40)

## 2021-01-22 LAB — T4, FREE: Free T4: 0.92 ng/dL (ref 0.61–1.12)

## 2021-01-22 LAB — RETICULOCYTES
Immature Retic Fract: 18.3 % — ABNORMAL HIGH (ref 2.3–15.9)
RBC.: 3.9 MIL/uL (ref 3.87–5.11)
Retic Count, Absolute: 45.6 10*3/uL (ref 19.0–186.0)
Retic Ct Pct: 1.2 % (ref 0.4–3.1)

## 2021-01-22 LAB — HEMOGLOBIN A1C
Hgb A1c MFr Bld: 9.1 % — ABNORMAL HIGH (ref 4.8–5.6)
Mean Plasma Glucose: 214.47 mg/dL

## 2021-01-22 LAB — TYPE AND SCREEN
ABO/RH(D): O POS
Antibody Screen: NEGATIVE

## 2021-01-22 LAB — IRON AND TIBC
Iron: 14 ug/dL — ABNORMAL LOW (ref 28–170)
Saturation Ratios: 4 % — ABNORMAL LOW (ref 10.4–31.8)
TIBC: 395 ug/dL (ref 250–450)
UIBC: 381 ug/dL

## 2021-01-22 LAB — MAGNESIUM: Magnesium: 2 mg/dL (ref 1.7–2.4)

## 2021-01-22 LAB — FOLATE: Folate: 5.7 ng/mL — ABNORMAL LOW (ref >5.9)

## 2021-01-22 LAB — VITAMIN B12: Vitamin B-12: 256 pg/mL (ref 180–914)

## 2021-01-22 LAB — TROPONIN I (HIGH SENSITIVITY)
Troponin I (High Sensitivity): 6 ng/L (ref <18)
Troponin I (High Sensitivity): 6 ng/L (ref <18)

## 2021-01-22 LAB — FERRITIN: Ferritin: 4 ng/mL — ABNORMAL LOW (ref 11–307)

## 2021-01-22 LAB — TSH: TSH: 0.064 u[IU]/mL — ABNORMAL LOW (ref 0.350–4.500)

## 2021-01-22 MED ORDER — DIPHENHYDRAMINE HCL 25 MG PO CAPS
25.0000 mg | ORAL_CAPSULE | Freq: Four times a day (QID) | ORAL | Status: DC | PRN
  Administered 2021-01-22: 25 mg via ORAL
  Filled 2021-01-22: qty 1

## 2021-01-22 MED ORDER — IBUPROFEN 400 MG PO TABS
400.0000 mg | ORAL_TABLET | Freq: Four times a day (QID) | ORAL | Status: DC | PRN
  Administered 2021-01-22: 400 mg via ORAL
  Filled 2021-01-22: qty 1

## 2021-01-22 MED ORDER — SODIUM CHLORIDE 0.9 % IV SOLN
250.0000 mg | Freq: Every day | INTRAVENOUS | Status: DC
  Administered 2021-01-22: 250 mg via INTRAVENOUS
  Filled 2021-01-22 (×2): qty 20

## 2021-01-22 MED ORDER — MOMETASONE FUROATE 50 MCG/ACT NA SUSP
1.0000 | Freq: Every day | NASAL | Status: DC | PRN
  Filled 2021-01-22: qty 17

## 2021-01-22 MED ORDER — PROCHLORPERAZINE EDISYLATE 10 MG/2ML IJ SOLN
10.0000 mg | Freq: Once | INTRAMUSCULAR | Status: AC
  Administered 2021-01-22: 10 mg via INTRAVENOUS
  Filled 2021-01-22: qty 2

## 2021-01-22 MED ORDER — TRAMADOL HCL 50 MG PO TABS: 50.0000 mg | ORAL_TABLET | Freq: Four times a day (QID) | ORAL | Status: DC | PRN

## 2021-01-22 MED ORDER — METFORMIN HCL 500 MG PO TABS: 500.0000 mg | ORAL_TABLET | Freq: Two times a day (BID) | ORAL | Status: DC

## 2021-01-22 MED ORDER — FOLIC ACID 1 MG PO TABS
1.0000 mg | ORAL_TABLET | Freq: Every day | ORAL | Status: DC
  Administered 2021-01-23: 1 mg via ORAL
  Filled 2021-01-22: qty 1

## 2021-01-22 NOTE — Progress Notes (Addendum)
PROGRESS NOTE    Erica Estrada  IWP:[id number] DOB: 10-15-1950 DOA: 01/21/2021 PCP: Lorn Junes, FNP    Brief Narrative:  Erica Estrada was admitted to the hospital with the working diagnosis of chest pain.   70 year old female past medical history of type 2 diabetes mellitus, hypertension, asthma, arthritis, Graves' disease, GERD, glaucoma, dyslipidemia and thyroid nodules who presented with acute onset of chest pain.  10/10 in intensity, tightness in nature and radiates to her left arm.  Mildly improved with sublingual nitroglycerin.  Because of persistent symptoms she came to the hospital.  On her initial physical examination blood pressure 131/67, heart rate 87, respiratory rate 11, oxygen saturation 98%.  Her lungs were clear to auscultation bilaterally, heart S1-S2, present, rhythmic, abdomen soft, no lower extremity edema.  Left eye blindness.  Sodium 135, potassium 4.5, chloride 106, bicarb 22, glucose 358, BUN 11, creatinine 0.67, White count 11.4, hemoglobin 9.3, hematocrit 31.7, platelets 284. SARS COVID-19 negative.  Chest radiograph no infiltrates.  EKG 85 bpm, normal axis, normal intervals, sinus rhythm, Q-wave lead III-aVF, no significant ST segment or T wave changes, positive LVH.  Assessment & Plan:   Principal Problem:   Chest pain Active Problems:   Multiple thyroid nodules   Hypertension   Glaucoma suspect with open angle   GERD (gastroesophageal reflux disease)   Diabetes mellitus, type 2 (HCC)   Anemia   Chest pain. Patient had acute chest pain while at rest, she was having an argument with her daughter, episode lasted 15 minutes, of severe chest pain, precordial and radiated to the left shoulder, no associated symptoms.   Patient had chest pain in the recent past but not particularly with exertion.  Her risk stratification for cardiovascular disease is elevated considering her age, HTN and T2DM.   Plan to get troponin x3. Continue telemetry  monitoring and report from echocardiogram. Patient will benefit from stress test, inpatient or outpatient will dependent on further results from echo and cardiac enzymes.   2. HTN blood pressure has remained stable 129/67 mmHg today Continue with amlodipine.   3. T2DM/ dyslipidemia Uncontrolled diabetes mellitus with hyperglycemia. Continue insulin sliding scale for glucose cover and monitoring.  Resume metformin.  Continue with statin therapy.   4. GERD continue with antiacid therapy  5. Chronic anemia with iron deficiency. Iron stores with serum iron of 14, TIBC 395, transferrin saturation 4 and ferritin 4. Add IV iron infusion.   6. Folate deficiency. Add folic acid and consult nutrition.   7. Depression. Continue with paroxetine.   Patient continue to be at high risk for recurrent chest pain   Status is: Observation  The patient remains OBS appropriate and will d/c before 2 midnights.  DVT prophylaxis: Enoxaparin   Code Status:    full  Family Communication:   No family at the bedside      Subjective: Patient with no nausea or vomiting, her chest pain has improved, no dyspnea, at home with no exertional chest pain,  Objective: Vitals:   01/22/21 0936 01/22/21 1005 01/22/21 1200 01/22/21 1300  BP: 139/75 (!) 132/94 118/76 129/67  Pulse:  87 77 72  Resp:  (!) 21 15 19   Temp:      TempSrc:      SpO2:  99% 99% 100%  Weight:      Height:       No intake or output data in the 24 hours ending 01/22/21 1336 Filed Weights   01/21/21 1202  Weight: 73.9 kg  Examination:   General: Not in pain or dyspnea  Neurology: Awake and alert, non focal  E ENT: no pallor, no icterus, oral mucosa moist Cardiovascular: No JVD. S1-S2 present, rhythmic, no gallops, rubs, or murmurs. No lower extremity edema. Pulmonary: positive breath sounds bilaterally, adequate air movement, no wheezing, rhonchi or rales. Gastrointestinal. Abdomen soft and non tender Skin. No  rashes Musculoskeletal: no joint deformities     Data Reviewed: I have personally reviewed following labs and imaging studies  CBC: Recent Labs  Lab 01/21/21 1203  WBC 11.4*  HGB 9.3*  HCT 31.7*  MCV 76.0*  PLT 284   Basic Metabolic Panel: Recent Labs  Lab 01/21/21 1203 01/22/21 0125  NA 135  --   K 4.5  --   CL 106  --   CO2 22  --   GLUCOSE 358*  --   BUN 11  --   CREATININE 0.67  --   CALCIUM 8.8*  --   MG  --  2.0   GFR: Estimated Creatinine Clearance: 64 mL/min (by C-G formula based on SCr of 0.67 mg/dL). Liver Function Tests: No results for input(s): AST, ALT, ALKPHOS, BILITOT, PROT, ALBUMIN in the last 168 hours. No results for input(s): LIPASE, AMYLASE in the last 168 hours. No results for input(s): AMMONIA in the last 168 hours. Coagulation Profile: No results for input(s): INR, PROTIME in the last 168 hours. Cardiac Enzymes: No results for input(s): CKTOTAL, CKMB, CKMBINDEX, TROPONINI in the last 168 hours. BNP (last 3 results) No results for input(s): PROBNP in the last 8760 hours. HbA1C: Recent Labs    01/21/21 2008  HGBA1C 9.1*   CBG: Recent Labs  Lab 01/22/21 0040 01/22/21 0839 01/22/21 1218  GLUCAP 201* 159* 199*   Lipid Profile: Recent Labs    01/22/21 0747  CHOL 144  HDL 47  LDLCALC 79  TRIG 92  CHOLHDL 3.1   Thyroid Function Tests: Recent Labs    01/22/21 0125  TSH 0.064*  FREET4 0.92   Anemia Panel: Recent Labs    01/22/21 0125  VITAMINB12 256  FOLATE 5.7*  FERRITIN 4*  TIBC 395  IRON 14*  RETICCTPCT 1.2      Radiology Studies: I have reviewed all of the imaging during this hospital visit personally     Scheduled Meds:  amLODipine  10 mg Oral Daily   aspirin  81 mg Oral Daily   atorvastatin  80 mg Oral Daily   enoxaparin (LOVENOX) injection  40 mg Subcutaneous Q24H   insulin aspart  0-15 Units Subcutaneous TID WC   methimazole  2.5 mg Oral Daily   pantoprazole  40 mg Oral Daily   PARoxetine  40  mg Oral [id number]   tiotropium  1 capsule Inhalation Daily   Continuous Infusions:   LOS: 0 days        Erica Manley Annett Gula, MD

## 2021-01-22 NOTE — ED Notes (Signed)
Pt ambulated to the commode with standby assist.

## 2021-01-22 NOTE — ED Notes (Addendum)
RN had just stepped out of room to retrieve PRN zofran when secretary saw pt on floor. Pt on left side actively vomiting and diaphoretic. RN asked pt what happened and pt stating she was trying to get her phone. Pt stating she doesn't think she hit her head. EDP Bradler aware and at bedside. Pt assisted back in bed by staff. Repeat EKG done. RN on phone with admission MD Arrien to make aware of situation.

## 2021-01-22 NOTE — Progress Notes (Signed)
*  PRELIMINARY RESULTS* Echocardiogram 2D Echocardiogram has been performed.  Erica Estrada 01/22/2021, 12:50 PM

## 2021-01-22 NOTE — ED Notes (Signed)
Provider Cliffton Asters, NP) made aware of the patients intermittent nausea and the minimal improvement with the Zofran she received earlier.

## 2021-01-22 NOTE — ED Notes (Signed)
Pt iron infusion finished. Pt c/o numbness and tingling in left arm and no relief from HA. VSS. MD messaged

## 2021-01-22 NOTE — ED Notes (Signed)
Request made for transport to the floor ?

## 2021-01-22 NOTE — ED Notes (Signed)
Safety zoned filled out

## 2021-01-22 NOTE — ED Notes (Signed)
Pt give diet coke at this time. Pt denies any further needs

## 2021-01-22 NOTE — ED Notes (Signed)
RN at bedside. Pt stating she feels nauseas and can't find her phone. RN calling phone but unable to locate at this time.

## 2021-01-23 ENCOUNTER — Encounter: Payer: Self-pay | Admitting: Internal Medicine

## 2021-01-23 DIAGNOSIS — R079 Chest pain, unspecified: Secondary | ICD-10-CM | POA: Diagnosis not present

## 2021-01-23 LAB — GLUCOSE, CAPILLARY
Glucose-Capillary: 164 mg/dL — ABNORMAL HIGH (ref 70–99)
Glucose-Capillary: 174 mg/dL — ABNORMAL HIGH (ref 70–99)

## 2021-01-23 LAB — ECHOCARDIOGRAM COMPLETE
AR max vel: 1.7 cm2
AV Peak grad: 11.6 mmHg
Ao pk vel: 1.7 m/s
Area-P 1/2: 4.63 cm2
Height: 65 in
S' Lateral: 3.2 cm
Weight: 2608 oz

## 2021-01-23 LAB — TROPONIN I (HIGH SENSITIVITY): Troponin I (High Sensitivity): 6 ng/L (ref <18)

## 2021-01-23 MED ORDER — FOLIC ACID 1 MG PO TABS: 1.0000 mg | ORAL_TABLET | Freq: Every day | ORAL | 0 refills | Status: AC

## 2021-01-23 MED ORDER — FERROUS SULFATE 325 (65 FE) MG PO TABS: 325.0000 mg | ORAL_TABLET | Freq: Every day | ORAL | 0 refills | Status: DC

## 2021-01-23 NOTE — Discharge Summary (Signed)
Discharge Summary  Erica Estrada [id number] DOB: [birth year]  PCP: Bunnie Pion, FNP  Admit date: 01/21/2021 Discharge date: 01/23/2021  Time spent: 35 minutes   Recommendations for Outpatient Follow-up:  Follow-up with your PCP. Take your medications as prescribed   Discharge Diagnoses:  Active Hospital Problems   Diagnosis Date Noted   Chest pain 01/21/2021   Anemia 01/22/2021   Multiple thyroid nodules 01/12/2016   GERD (gastroesophageal reflux disease) 04/28/2011   Glaucoma suspect with open angle 04/17/2011   Diabetes mellitus, type 2 ([place]) 12/26/2010   Hypertension 12/26/2010    Resolved Hospital Problems  No resolved problems to display.    Discharge Condition: Stable  Diet recommendation: Resume previous diet.  Vitals:   01/22/21 2338 01/23/21 0255  BP: (!) 142/85 (!) 146/80  Pulse: 89 94  Resp: 18 16  Temp: 98.1 F (36.7 C) 98.1 F (36.7 C)  SpO2: 96% 93%    History of present illness:  Erica Estrada was admitted to the hospital with the working diagnosis of chest pain.    70 year old female past medical history of type 2 diabetes mellitus, hypertension, asthma, arthritis, Graves' disease, GERD, glaucoma, dyslipidemia and thyroid nodules who presented with acute onset of chest pain.  10/10 in intensity, tightness in nature and radiates to her left arm.  Mildly improved with sublingual nitroglycerin.  Because of persistent symptoms she came to the hospital.  On her initial physical examination blood pressure 131/67, heart rate 87, respiratory rate 11, oxygen saturation 98%.  Her lungs were clear to auscultation bilaterally, heart S1-S2, present, rhythmic, abdomen soft, no lower extremity edema.  Left eye blindness.   Sodium 135, potassium 4.5, chloride 106, bicarb 22, glucose 358, BUN 11, creatinine 0.67, White count 11.4, hemoglobin 9.3, hematocrit 31.7, platelets 284. SARS COVID-19 negative.   Chest radiograph no infiltrates.   EKG 85 bpm,  normal axis, normal intervals, sinus rhythm, Q-wave lead III-aVF, no significant ST segment or T wave changes, positive LVH.  2D echo done on 01/22/2021 showed normal LVEF 60 to 65% with no left ventricle regional wall motion abnormalities.  01/23/2021: Patient was seen and examined at her bedside.  There were no acute events overnight.  She has no new complaints.  She denies any chest pain or dyspnea or palpitations.  She is eager to go home.  Hospital Course:  Principal Problem:   Chest pain Active Problems:   Multiple thyroid nodules   Hypertension   Glaucoma suspect with open angle   GERD (gastroesophageal reflux disease)   Diabetes mellitus, type 2 ([facility])   Anemia  Resolved chest pain.  Troponin negative x2.  Twelve-lead EKG no evidence of acute ischemia.  2D echo unrevealing. At the time of this visit, she denies any anginal symptoms.   2. HTN. Stable Continue with amlodipine.    3. T2DM/ dyslipidemia Resume home regimen Follow-up with your PCP   4. GERD continue with antiacid therapy   5. Chronic anemia with iron deficiency. Iron stores with serum iron of 14, TIBC 395, transferrin saturation 4 and ferritin 4. She has received IV iron infusion. Continue iron supplement Follow-up with your PCP   6. Folate deficiency.  Continue folic acid supplement.   7. Depression. Continue with paroxetine.      Code Status:               full  Family Communication:     Husband at her bedside.  Discharge Exam: BP (!) 146/80 (BP Location: Right Arm)  Pulse 94   Temp 98.1 F (36.7 C) (Oral)   Resp 16   Ht 5\' 5"  (1.651 m)   Wt 73.9 kg   SpO2 93%   BMI 27.12 kg/m  General: 70 y.o. year-old female well developed well nourished in no acute distress.  Alert and oriented x3. Cardiovascular: Regular rate and rhythm with no rubs or gallops.  No thyromegaly or JVD noted.   Respiratory: Clear to auscultation with no wheezes or rales. Good inspiratory effort. Abdomen: Soft  nontender nondistended with normal bowel sounds x4 quadrants. Musculoskeletal: No lower extremity edema. 2/4 pulses in all 4 extremities. Skin: No ulcerative lesions noted or rashes, Psychiatry: Mood is appropriate for condition and setting  Discharge Instructions You were cared for by a hospitalist during your hospital stay. If you have any questions about your discharge medications or the care you received while you were in the hospital after you are discharged, you can call the unit and asked to speak with the hospitalist on call if the hospitalist that took care of you is not available. Once you are discharged, your primary care physician will handle any further medical issues. Please note that NO REFILLS for any discharge medications will be authorized once you are discharged, as it is imperative that you return to your primary care physician (or establish a relationship with a primary care physician if you do not have one) for your aftercare needs so that they can reassess your need for medications and monitor your lab values.   Allergies as of 01/23/2021       Reactions   Ace Inhibitors Cough   Chronic cough for 3 months. Trial off lisinopril starting 05/11/2011 effective in stopping cough.   Fluticasone Other (See Comments)   choking        Medication List     STOP taking these medications    Durezol 0.05 % Emul Generic drug: Difluprednate   Ilevro 0.3 % ophthalmic suspension Generic drug: nepafenac   meloxicam 7.5 MG tablet Commonly known as: MOBIC   traZODone 50 MG tablet Commonly known as: DESYREL       TAKE these medications    acetaminophen 650 MG CR tablet Commonly known as: TYLENOL Take 1,300 mg by mouth 2 (two) times daily as needed for pain.   amLODipine 10 MG tablet Commonly known as: NORVASC Take 1 tablet (10 mg total) by mouth daily.   Aspercreme Lidocaine 4 % Crea Generic drug: Lidocaine HCl Apply 1 application topically 2 (two) times daily.  Apply to back and knees prn for pain   ASPIRIN 81 PO Take by mouth daily.   atorvastatin 80 MG tablet Commonly known as: LIPITOR Take 1 tablet (80 mg total) by mouth daily.   BIOTENE MOISTURIZING MOUTH MT Use as directed 1-2 sprays in the mouth or throat 4 (four) times daily as needed.   calcium carbonate 500 MG chewable tablet Commonly known as: TUMS - dosed in mg elemental calcium Chew 2 tablets by mouth 2 (two) times daily as needed for indigestion or heartburn.   camphor-menthol lotion Commonly known as: SARNA Apply 1 application topically as needed for itching.   chlorhexidine 0.12 % solution Commonly known as: PERIDEX Use as directed 15 mLs in the mouth or throat 2 (two) times daily as needed. Swish for 30 seconds then spit   Cholecalciferol 25 MCG (1000 UT) capsule Take 1,000 Units by mouth daily.   clobetasol cream 0.05 % Commonly known as: TEMOVATE Apply 1 application topically 2 (  two) times a week. Apply to vulva   clotrimazole 1 % cream Commonly known as: LOTRIMIN Apply 1 application topically 2 (two) times daily.   empagliflozin 10 MG Tabs tablet Commonly known as: JARDIANCE Take 10 mg by mouth daily.   ferrous sulfate 325 (65 FE) MG tablet Take 1 tablet (325 mg total) by mouth daily.   folic acid 1 MG tablet Commonly known as: FOLVITE Take 1 tablet (1 mg total) by mouth daily. Start taking on: January 24, 2021   metFORMIN 500 MG tablet Commonly known as: GLUCOPHAGE Take 1 tablet (500 mg total) by mouth 2 (two) times daily with a meal. What changed: how much to take   methimazole 5 MG tablet Commonly known as: TAPAZOLE Take 2.5 mg by mouth daily.   mometasone 50 MCG/ACT nasal spray Commonly known as: NASONEX Place 2 sprays into the nose daily as needed.   nitroGLYCERIN 0.4 MG SL tablet Commonly known as: NITROSTAT Place 0.4 mg under the tongue every 5 (five) minutes as needed for chest pain.   omeprazole 40 MG capsule Commonly known as:  PRILOSEC Take 1 capsule by mouth daily.   PARoxetine 40 MG tablet Commonly known as: PAXIL Take 1 tablet (40 mg total) by mouth every morning.   pregabalin 100 MG capsule Commonly known as: LYRICA Take 100 mg by mouth 2 (two) times daily.   Replens Gel Place 1 application vaginally 3 (three) times daily as needed. Apply to vagina and vulva as needed for itching. Use on days that you do not use clobetasol   Spiriva HandiHaler 18 MCG inhalation capsule Generic drug: tiotropium Place 1 capsule into inhaler and inhale daily.   Tyler Aas FlexTouch 200 UNIT/ML FlexTouch Pen Generic drug: insulin degludec Inject 60 Units into the skin at bedtime.   Ventolin HFA 108 (90 Base) MCG/ACT inhaler Generic drug: albuterol Inhale 2 puffs into the lungs every 6 (six) hours as needed.       Allergies  Allergen Reactions   Ace Inhibitors Cough    Chronic cough for 3 months. Trial off lisinopril starting 05/11/2011 effective in stopping cough.   Fluticasone Other (See Comments)    choking      The results of significant diagnostics from this hospitalization (including imaging, microbiology, ancillary and laboratory) are listed below for reference.    Significant Diagnostic Studies: DG Chest 2 View  Result Date: 01/21/2021 CLINICAL DATA:  Sudden onset chest pain. EXAM: CHEST - 2 VIEW COMPARISON:  Chest x-ray dated October 07, 2020. FINDINGS: The heart size and mediastinal contours are within normal limits. Both lungs are clear. The visualized skeletal structures are unremarkable. IMPRESSION: No active cardiopulmonary disease. Electronically Signed   By: Titus Dubin M.D.   On: 01/21/2021 13:06   CT Angio Chest PE W and/or Wo Contrast  Result Date: 01/21/2021 CLINICAL DATA:  Chest pain EXAM: CT ANGIOGRAPHY CHEST WITH CONTRAST TECHNIQUE: Multidetector CT imaging of the chest was performed using the standard protocol during bolus administration of intravenous contrast. Multiplanar CT image  reconstructions and MIPs were obtained to evaluate the vascular anatomy. CONTRAST:  10mL OMNIPAQUE IOHEXOL 350 MG/ML SOLN COMPARISON:  None. FINDINGS: Cardiovascular: Adequate contrast opacification of the pulmonary arteries. No evidence pulmonary embolus. Normal heart size. Three-vessel coronary artery calcifications. Atherosclerotic disease of the thoracic aorta. Mediastinum/Nodes: Enlarged heterogeneous thyroid. Esophagus is unremarkable. No pathologically enlarged lymph nodes seen in the chest. Lungs/Pleura: Central airways are patent. Mild bibasilar atelectasis. No consolidation, pleural effusion or pneumothorax. Juxtapleural nodule of the right  upper lobe measuring 4 mm series 6, image 32. Upper Abdomen: Cholelithiasis.  No acute abnormality. Musculoskeletal: No chest wall abnormality. No acute or significant osseous findings. Review of the MIP images confirms the above findings. IMPRESSION: 1. No evidence of pulmonary embolus. 2. No acute lung parenchymal abnormality. 3. Three-vessel coronary artery calcifications, recommend ASCVD risk assessment. 4. Small solid pulmonary nodule of the right upper lobe measuring 4 mm. No follow-up needed if patient is low-risk. Non-contrast chest CT can be considered in 12 months if patient is high-risk. This recommendation follows the consensus statement: Guidelines for Management of Incidental Pulmonary Nodules Detected on CT Images: From the Fleischner Society 2017; Radiology 2017; 284:228-243. 5.  Aortic Atherosclerosis (ICD10-I70.0). Electronically Signed   By: Yetta Glassman M.D.   On: 01/21/2021 19:59   ECHOCARDIOGRAM COMPLETE  Result Date: 01/23/2021    ECHOCARDIOGRAM REPORT   Patient Name:   Erica Estrada Date of Exam: 01/22/2021 Medical Rec #:  [id number]        Height:       65.0 in Accession #:    [id number]       Weight:       163.0 lb Date of Birth:  [birth year]        BSA:          1.813 m Patient Age:    30 years         BP:           135/66 mmHg  Patient Gender: F                HR:           85 bpm. Exam Location:  [facility] Procedure: 2D Echo, Cardiac Doppler and Color Doppler Indications:     Chest pain [id number]  History:         Patient has no prior history of Echocardiogram examinations.                  Risk Factors:Hypertension and Diabetes.  Sonographer:     Alyse Low Roar Referring Phys:  [place] Diagnosing Phys: Serafina Royals MD IMPRESSIONS  1. Left ventricular ejection fraction, by estimation, is 60 to 65%. The left ventricle has normal function. The left ventricle has no regional wall motion abnormalities. Left ventricular diastolic parameters were normal.  2. Right ventricular systolic function is normal. The right ventricular size is normal.  3. The mitral valve is normal in structure. Trivial mitral valve regurgitation.  4. The aortic valve is normal in structure. Aortic valve regurgitation is not visualized. FINDINGS  Left Ventricle: Left ventricular ejection fraction, by estimation, is 60 to 65%. The left ventricle has normal function. The left ventricle has no regional wall motion abnormalities. The left ventricular internal cavity size was normal in size. There is  no left ventricular hypertrophy. Left ventricular diastolic parameters were normal. Right Ventricle: The right ventricular size is normal. No increase in right ventricular wall thickness. Right ventricular systolic function is normal. Left Atrium: Left atrial size was normal in size. Right Atrium: Right atrial size was normal in size. Pericardium: There is no evidence of pericardial effusion. Mitral Valve: The mitral valve is normal in structure. Trivial mitral valve regurgitation. Tricuspid Valve: The tricuspid valve is normal in structure. Tricuspid valve regurgitation is trivial. Aortic Valve: The aortic valve is normal in structure. Aortic valve regurgitation is not visualized. Aortic valve peak gradient measures 11.6 mmHg. Pulmonic Valve: The pulmonic valve was normal  in structure. Pulmonic  valve regurgitation is not visualized. Aorta: The aortic root and ascending aorta are structurally normal, with no evidence of dilitation. IAS/Shunts: No atrial level shunt detected by color flow Doppler.  LEFT VENTRICLE PLAX 2D LVIDd:         4.60 cm   Diastology LVIDs:         3.20 cm   LV e' medial:    7.07 cm/s LV PW:         0.90 cm   LV E/e' medial:  10.7 LV IVS:        1.00 cm   LV e' lateral:   10.90 cm/s LVOT diam:     1.90 cm   LV E/e' lateral: 7.0 LVOT Area:     2.84 cm  RIGHT VENTRICLE RV Mid diam:    2.90 cm RV S prime:     11.90 cm/s TAPSE (M-mode): 2.2 cm LEFT ATRIUM             Index        RIGHT ATRIUM           Index LA diam:        3.60 cm 1.99 cm/m   RA Area:     11.40 cm LA Vol (A2C):   50.0 ml 27.57 ml/m  RA Volume:   23.80 ml  13.12 ml/m LA Vol (A4C):   37.1 ml 20.46 ml/m LA Biplane Vol: 43.0 ml 23.71 ml/m  AORTIC VALVE                 PULMONIC VALVE AV Area (Vmax): 1.70 cm     PV Vmax:          1.26 m/s AV Vmax:        170.00 cm/s  PV Peak grad:     6.4 mmHg AV Peak Grad:   11.6 mmHg    PR End Diast Vel: 5.48 msec LVOT Vmax:      102.00 cm/s  RVOT Peak grad:   3 mmHg  AORTA Ao Root diam: 2.60 cm MITRAL VALVE MV Area (PHT): 4.63 cm    SHUNTS MV Decel Time: 164 msec    Systemic Diam: 1.90 cm MV E velocity: 75.80 cm/s MV A velocity: 90.80 cm/s MV E/A ratio:  0.83 MV A Prime:    15.0 cm/s Serafina Royals MD Electronically signed by Serafina Royals MD Signature Date/Time: 01/23/2021/7:37:51 AM    Final     Microbiology: Recent Results (from the past 240 hour(s))  Resp Panel by RT-PCR (Flu A&B, Covid) Nasopharyngeal Swab     Status: None   Collection Time: 01/21/21  7:05 PM   Specimen: Nasopharyngeal Swab; Nasopharyngeal(NP) swabs in vial transport medium  Result Value Ref Range Status   SARS Coronavirus 2 by RT PCR NEGATIVE NEGATIVE Final    Comment: (NOTE) SARS-CoV-2 target nucleic acids are NOT DETECTED.  The SARS-CoV-2 RNA is generally detectable in  upper respiratory specimens during the acute phase of infection. The lowest concentration of SARS-CoV-2 viral copies this assay can detect is 138 copies/mL. A negative result does not preclude SARS-Cov-2 infection and should not be used as the sole basis for treatment or other patient management decisions. A negative result may occur with  improper specimen collection/handling, submission of specimen other than nasopharyngeal swab, presence of viral mutation(s) within the areas targeted by this assay, and inadequate number of viral copies(<138 copies/mL). A negative result must be combined with clinical observations, patient history, and epidemiological information. The expected result is  Negative.  Fact Sheet for Patients:  BloggerCourse.com  Fact Sheet for Healthcare Providers:  SeriousBroker.it  This test is no t yet approved or cleared by the [place] FDA and  has been authorized for detection and/or diagnosis of SARS-CoV-2 by FDA under an Emergency Use Authorization (EUA). This EUA will remain  in effect (meaning this test can be used) for the duration of the COVID-19 declaration under Section 564(b)(1) of the Act, 21 U.S.C.section 360bbb-3(b)(1), unless the authorization is terminated  or revoked sooner.       Influenza A by PCR NEGATIVE NEGATIVE Final   Influenza B by PCR NEGATIVE NEGATIVE Final    Comment: (NOTE) The Xpert Xpress SARS-CoV-2/FLU/RSV plus assay is intended as an aid in the diagnosis of influenza from Nasopharyngeal swab specimens and should not be used as a sole basis for treatment. Nasal washings and aspirates are unacceptable for Xpert Xpress SARS-CoV-2/FLU/RSV testing.  Fact Sheet for Patients: BloggerCourse.com  Fact Sheet for Healthcare Providers: SeriousBroker.it  This test is not yet approved or cleared by the [place] FDA and has been  authorized for detection and/or diagnosis of SARS-CoV-2 by FDA under an Emergency Use Authorization (EUA). This EUA will remain in effect (meaning this test can be used) for the duration of the COVID-19 declaration under Section 564(b)(1) of the Act, 21 U.S.C. section 360bbb-3(b)(1), unless the authorization is terminated or revoked.  Performed at [facility], [address] [place], [place] [address]      Labs: Basic Metabolic Panel: Recent Labs  Lab 01/21/21 1203 01/22/21 0125  NA 135  --   K 4.5  --   CL 106  --   CO2 22  --   GLUCOSE 358*  --   BUN 11  --   CREATININE 0.67  --   CALCIUM 8.8*  --   MG  --  2.0   Liver Function Tests: No results for input(s): AST, ALT, ALKPHOS, BILITOT, PROT, ALBUMIN in the last 168 hours. No results for input(s): LIPASE, AMYLASE in the last 168 hours. No results for input(s): AMMONIA in the last 168 hours. CBC: Recent Labs  Lab 01/21/21 1203  WBC 11.4*  HGB 9.3*  HCT 31.7*  MCV 76.0*  PLT 284   Cardiac Enzymes: No results for input(s): CKTOTAL, CKMB, CKMBINDEX, TROPONINI in the last 168 hours. BNP: BNP (last 3 results) No results for input(s): BNP in the last 8760 hours.  ProBNP (last 3 results) No results for input(s): PROBNP in the last 8760 hours.  CBG: Recent Labs  Lab 01/22/21 1218 01/22/21 1807 01/22/21 2306 01/23/21 0736 01/23/21 1140  GLUCAP 199* 239* 265* 164* 174*       Signed:  Darlin Drop, MD [facility] 01/23/2021, 11:50 AM

## 2021-01-23 NOTE — Plan of Care (Signed)
  Problem: Education: Goal: Knowledge of General Education information will improve Description: Including pain rating scale, medication(s)/side effects and non-pharmacologic comfort measures 01/23/2021 1137 by Ansel Bong, RN Outcome: Adequate for Discharge 01/23/2021 1137 by Ansel Bong, RN Outcome: Adequate for Discharge   Problem: Health Behavior/Discharge Planning: Goal: Ability to manage health-related needs will improve 01/23/2021 1137 by Ansel Bong, RN Outcome: Adequate for Discharge 01/23/2021 1137 by Ansel Bong, RN Outcome: Adequate for Discharge   Problem: Clinical Measurements: Goal: Ability to maintain clinical measurements within normal limits will improve 01/23/2021 1137 by Ansel Bong, RN Outcome: Adequate for Discharge 01/23/2021 1137 by Ansel Bong, RN Outcome: Adequate for Discharge Goal: Will remain free from infection 01/23/2021 1137 by Ansel Bong, RN Outcome: Adequate for Discharge 01/23/2021 1137 by Ansel Bong, RN Outcome: Adequate for Discharge Goal: Diagnostic test results will improve 01/23/2021 1137 by Ansel Bong, RN Outcome: Adequate for Discharge 01/23/2021 1137 by Ansel Bong, RN Outcome: Adequate for Discharge Goal: Respiratory complications will improve 01/23/2021 1137 by Ansel Bong, RN Outcome: Adequate for Discharge 01/23/2021 1137 by Ansel Bong, RN Outcome: Adequate for Discharge Goal: Cardiovascular complication will be avoided 01/23/2021 1137 by Ansel Bong, RN Outcome: Adequate for Discharge 01/23/2021 1137 by Ansel Bong, RN Outcome: Adequate for Discharge   Problem: Activity: Goal: Risk for activity intolerance will decrease 01/23/2021 1137 by Ansel Bong, RN Outcome: Adequate for Discharge 01/23/2021 1137 by Ansel Bong, RN Outcome: Adequate for Discharge   Problem: Nutrition: Goal: Adequate nutrition will be maintained 01/23/2021 1137 by Ansel Bong, RN Outcome:  Adequate for Discharge 01/23/2021 1137 by Ansel Bong, RN Outcome: Adequate for Discharge   Problem: Coping: Goal: Level of anxiety will decrease 01/23/2021 1137 by Ansel Bong, RN Outcome: Adequate for Discharge 01/23/2021 1137 by Ansel Bong, RN Outcome: Adequate for Discharge   Problem: Elimination: Goal: Will not experience complications related to bowel motility 01/23/2021 1137 by Ansel Bong, RN Outcome: Adequate for Discharge 01/23/2021 1137 by Ansel Bong, RN Outcome: Adequate for Discharge Goal: Will not experience complications related to urinary retention 01/23/2021 1137 by Ansel Bong, RN Outcome: Adequate for Discharge 01/23/2021 1137 by Ansel Bong, RN Outcome: Adequate for Discharge   Problem: Pain Managment: Goal: General experience of comfort will improve 01/23/2021 1137 by Ansel Bong, RN Outcome: Adequate for Discharge 01/23/2021 1137 by Ansel Bong, RN Outcome: Adequate for Discharge   Problem: Safety: Goal: Ability to remain free from injury will improve 01/23/2021 1137 by Ansel Bong, RN Outcome: Adequate for Discharge 01/23/2021 1137 by Ansel Bong, RN Outcome: Adequate for Discharge   Problem: Skin Integrity: Goal: Risk for impaired skin integrity will decrease 01/23/2021 1137 by Ansel Bong, RN Outcome: Adequate for Discharge 01/23/2021 1137 by Ansel Bong, RN Outcome: Adequate for Discharge

## 2021-01-23 NOTE — Progress Notes (Signed)
Inpatient Diabetes Program Recommendations  AACE/ADA: New Consensus Statement on Inpatient Glycemic Control   Target Ranges:  Prepandial:   less than 140 mg/dL      Peak postprandial:   less than 180 mg/dL (1-2 hours)      Critically ill patients:  140 - 180 mg/dL  Results for Erica Estrada, Erica Estrada (MRN [id number]) as of 01/23/2021 11:16  Ref. Range 01/22/2021 08:39 01/22/2021 12:18 01/22/2021 18:07 01/22/2021 23:06 01/23/2021 07:36  Glucose-Capillary Latest Ref Range: 70 - 99 mg/dL 202 (H) 542 (H) 706 (H) 265 (H) 164 (H)   Results for Erica Estrada, Erica Estrada (MRN [id number]) as of 01/23/2021 11:16  Ref. Range 01/21/2021 20:08  Hemoglobin A1C Latest Ref Range: 4.8 - 5.6 % 9.1 (H)    Review of Glycemic Control  Diabetes history: DM2 Outpatient Diabetes medications: Tresiba 60 units QHS, Jardiance 10 mg daily, Metformin 500 mg BID Current orders for Inpatient glycemic control: Metformin 500 mg BID, Novolog 0-15 units TID with meals  Inpatient Diabetes Program Recommendations:    Insulin: Please consider ordering Novolog 3 units TID with meals for meal coverage if patient eats at least 50% of meals.  HbgA1C: A1C 9.1% on 01/21/21 indicating an average glucose of 214 mg/dl over the past 2-3 months.  NOTE: Spoke with patient over the phone about diabetes and home regimen for diabetes control. Patient reports she recently got a new doctor at [facility] and she is taking [place] 60 units daily, Metformin 500 mg BID, Jardiance 10 mg daily for diabetes control. Patient reports that she is taking DM medications consistently as prescribed and that changes have been made lately with DM medication regimen. Patient reports checking glucose 1 time per day at night and that glucose is usually 200-300 mg/dl. Patient reports that her last A1C was between 7-8%.  Discussed A1C results (9.1% on 01/21/21) and explained that current A1C indicates an average glucose of 214 mg/dl over the past 2-3 months. Discussed glucose  and A1C goals. Discussed importance of checking CBGs and maintaining good CBG control to prevent long-term and short-term complications. Explained how hyperglycemia leads to damage within blood vessels which lead to the common complications seen with uncontrolled diabetes. Stressed to the patient the importance of improving glycemic control to prevent further complications from uncontrolled diabetes.  Explained that she likely needs adjustments with DM medications if glucose is staying consistently elevated. Encouraged patient to follow up with PCP in the near future. Encouraged patient to check glucose 2-3 times per day and be sure to take glucometer to follow up appointment with PCP. Patient verbalized understanding of information discussed and reports no further questions at this time related to diabetes.  Thanks, Orlando Penner, RN, MSN, CDE Diabetes Coordinator Inpatient Diabetes Program [phone] (Team Pager)

## 2021-01-23 NOTE — Progress Notes (Signed)
Pt arrived to unit on a stretcher from ED with transport. Spouse at bedside. Pt able was to slide herself over form stretcher to bed. A/O x4, FC. No skin issues noted and verified with second RN. No c/o pain voiced. IV intact to left AC, flushed and saline locked. Pt placed on telemetry monitor, NSR. Bed alarm on for safety. Pt oriented to room and unit. Call bell within reach, bed in low position and safety measures intact.

## 2021-01-23 NOTE — Progress Notes (Signed)
This RN provided discharge instructions and teaching to the patient. The patient verbalized and demonstrated understanding of the provided instructions. All outstanding questions resolved. L arm PIV removed. Cannula intact. Patient tolerated well. All belongings packed and in tow. Volunteer services to transport patient to private vehicle via wheelchair at time of departure.

## 2021-01-27 ENCOUNTER — Other Ambulatory Visit: Payer: Self-pay

## 2021-01-27 ENCOUNTER — Encounter: Payer: Self-pay | Admitting: Cardiology

## 2021-01-27 ENCOUNTER — Ambulatory Visit (INDEPENDENT_AMBULATORY_CARE_PROVIDER_SITE_OTHER): Payer: Medicare Other | Admitting: Cardiology

## 2021-01-27 VITALS — BP 128/60 | HR 79 | Ht 65.0 in | Wt 170.0 lb

## 2021-01-27 DIAGNOSIS — E7800 Pure hypercholesterolemia, unspecified: Secondary | ICD-10-CM | POA: Diagnosis not present

## 2021-01-27 DIAGNOSIS — R072 Precordial pain: Secondary | ICD-10-CM

## 2021-01-27 DIAGNOSIS — I10 Essential (primary) hypertension: Secondary | ICD-10-CM

## 2021-01-27 DIAGNOSIS — R079 Chest pain, unspecified: Secondary | ICD-10-CM | POA: Diagnosis not present

## 2021-01-27 DIAGNOSIS — I2510 Atherosclerotic heart disease of native coronary artery without angina pectoris: Secondary | ICD-10-CM | POA: Diagnosis not present

## 2021-01-27 DIAGNOSIS — R0609 Other forms of dyspnea: Secondary | ICD-10-CM

## 2021-01-27 MED ORDER — RIFAMPIN 300 MG PO CAPS: 600.0000 mg | ORAL_CAPSULE | Freq: Every day | ORAL | 0 refills | Status: AC

## 2021-01-27 NOTE — Patient Instructions (Signed)
Medication Instructions:  Your physician recommends that you continue on your current medications as directed. Please refer to the Current Medication list given to you today.  *If you need a refill on your cardiac medications before your next appointment, please call your pharmacy*   Lab Work: None ordered If you have labs (blood work) drawn today and your tests are completely normal, you will receive your results only by: MyChart Message (if you have MyChart) OR A paper copy in the mail If you have any lab test that is abnormal or we need to change your treatment, we will call you to review the results.   Testing/Procedures: Your physician has requested that you have a lexiscan myoview. For further information please visit [email or web address] Please follow instruction sheet, as given.    Follow-Up: At [facility], you and your health needs are our priority.  As part of our continuing mission to provide you with exceptional heart care, we have created designated Provider Care Teams.  These Care Teams include your primary Cardiologist (physician) and Advanced Practice Providers (APPs -  Physician Assistants and Nurse Practitioners) who all work together to provide you with the care you need, when you need it.  We recommend signing up for the patient portal called "MyChart".  Sign up information is provided on this After Visit Summary.  MyChart is used to connect with patients for Virtual Visits (Telemedicine).  Patients are able to view lab/test results, encounter notes, upcoming appointments, etc.  Non-urgent messages can be sent to your provider as well.   To learn more about what you can do with MyChart, go to [email or web address].    Your next appointment:   After stress test  The format for your next appointment:   In Person  Provider:   You may see Debbe Odea, MD or one of the following Advanced Practice Providers on your designated Care Team:   Nicolasa Ducking,  NP Eula Listen, PA-C Cadence Fransico Michael, PA-C:1}    Other Instructions [facility] MYOVIEW  Your caregiver has ordered a Stress Test with nuclear imaging. The purpose of this test is to evaluate the blood supply to your heart muscle. This procedure is referred to as a "Non-Invasive Stress Test." This is because other than having an IV started in your vein, nothing is inserted or "invades" your body. Cardiac stress tests are done to find areas of poor blood flow to the heart by determining the extent of coronary artery disease (CAD). Some patients exercise on a treadmill, which naturally increases the blood flow to your heart, while others who are  unable to walk on a treadmill due to physical limitations have a pharmacologic/chemical stress agent called Lexiscan . This medicine will mimic walking on a treadmill by temporarily increasing your coronary blood flow.   Please note: these test may take anywhere between 2-4 hours to complete  PLEASE REPORT TO [facility] MEDICAL MALL ENTRANCE  THE VOLUNTEERS AT THE FIRST DESK WILL DIRECT YOU WHERE TO GO  Date of Procedure:_____________________________________  Arrival Time for Procedure:______________________________  Instructions regarding medication:   __X__ : Hold diabetes medication morning of procedure  PLEASE NOTIFY THE OFFICE AT LEAST 24 HOURS IN ADVANCE IF YOU ARE UNABLE TO KEEP YOUR APPOINTMENT.  [phone] AND  PLEASE NOTIFY NUCLEAR MEDICINE AT [facility] AT LEAST 24 HOURS IN ADVANCE IF YOU ARE UNABLE TO KEEP YOUR APPOINTMENT. [phone]  How to prepare for your Myoview test:  Do not eat or drink after midnight No caffeine for 24 hours prior to  test No smoking 24 hours prior to test. Your medication may be taken with water.  If your doctor stopped a medication because of this test, do not take that medication. Ladies, please do not wear dresses.  Skirts or pants are appropriate. Please wear a short sleeve shirt. No perfume, cologne or lotion. Wear  comfortable walking shoes. No heels!

## 2021-01-27 NOTE — Progress Notes (Signed)
Cardiology Office Note:    Date:  01/27/2021   ID:  Erica Estrada, DOB June 18, 1950, MRN [id number]  PCP:  Lorn Junes, FNP   [facility] Providers Cardiologist:  None     Referring MD: Lorn Junes, FNP   Chief Complaint  Patient presents with   New Patient (Initial Visit)    ED follow up -- Chest pain. Meds reviewed verbally with patient.      History of Present Illness:    Erica Estrada is a 70 y.o. female with a hx of hypertension, hyperlipidemia, diabetes, former smoker x40+ years who presents due to chest pain and shortness of breath.  Patient was sent home, daughter said something to her causing her to be stressful.  She states having chest pain/tightness causing her to call EMS and was taken to the emergency room.  Work-up with chest CT did not show any PE.  She states symptoms resolved after couple of hours, patient was discharged.  She takes sublingual nitro as needed for years due to symptoms of chest pain.  She also endorses dyspnea on exertion sometimes not associated with chest pain.  Takes all her medications as prescribed  CT angio chest 01/21/2021 showed three-vessel coronary artery calcifications.  Echocardiogram 01/22/2021 EF 60 to 65%  Past Medical History:  Diagnosis Date   Arthritis    Asthma    Diabetes mellitus without complication (HCC)    Graves' disease    Hepatitis 1995   Hep C.  Treated.   Hypertension     Past Surgical History:  Procedure Laterality Date   ABDOMINAL HYSTERECTOMY     EYE SURGERY      Current Medications: Current Meds  Medication Sig   acetaminophen (TYLENOL) 650 MG CR tablet Take 1,300 mg by mouth 2 (two) times daily as needed for pain.   amLODipine (NORVASC) 10 MG tablet Take 1 tablet (10 mg total) by mouth daily.   Artificial Saliva (BIOTENE MOISTURIZING MOUTH MT) Use as directed 1-2 sprays in the mouth or throat 4 (four) times daily as needed.   ASPERCREME LIDOCAINE 4 % CREA Apply 1 application  topically 2 (two) times daily. Apply to back and knees prn for pain   ASPIRIN 81 PO Take by mouth daily.   atorvastatin (LIPITOR) 80 MG tablet Take 1 tablet (80 mg total) by mouth daily.   calcium carbonate (TUMS - DOSED IN MG ELEMENTAL CALCIUM) 500 MG chewable tablet Chew 2 tablets by mouth 2 (two) times daily as needed for indigestion or heartburn.   camphor-menthol (SARNA) lotion Apply 1 application topically as needed for itching.   chlorhexidine (PERIDEX) 0.12 % solution Use as directed 15 mLs in the mouth or throat 2 (two) times daily as needed. Swish for 30 seconds then spit   Cholecalciferol 25 MCG (1000 UT) capsule Take 1,000 Units by mouth daily.   clobetasol cream (TEMOVATE) 0.05 % Apply 1 application topically 2 (two) times a week. Apply to vulva   clotrimazole (LOTRIMIN) 1 % cream Apply 1 application topically 2 (two) times daily.   empagliflozin (JARDIANCE) 10 MG TABS tablet Take 10 mg by mouth daily.   ferrous sulfate 325 (65 FE) MG tablet Take 1 tablet (325 mg total) by mouth daily.   folic acid (FOLVITE) 1 MG tablet Take 1 tablet (1 mg total) by mouth daily.   insulin degludec (TRESIBA FLEXTOUCH) 200 UNIT/ML FlexTouch Pen Inject 60 Units into the skin at bedtime.   metFORMIN (GLUCOPHAGE) 500 MG tablet Take 1 tablet (  500 mg total) by mouth 2 (two) times daily with a meal. (Patient taking differently: Take 1,000 mg by mouth 2 (two) times daily with a meal.)   methimazole (TAPAZOLE) 5 MG tablet Take 2.5 mg by mouth daily.   mometasone (NASONEX) 50 MCG/ACT nasal spray Place 2 sprays into the nose daily as needed.   nitroGLYCERIN (NITROSTAT) 0.4 MG SL tablet Place 0.4 mg under the tongue every 5 (five) minutes as needed for chest pain.   omeprazole (PRILOSEC) 40 MG capsule Take 1 capsule by mouth daily.   PARoxetine (PAXIL) 40 MG tablet Take 1 tablet (40 mg total) by mouth every morning.   pregabalin (LYRICA) 100 MG capsule Take 100 mg by mouth 2 (two) times daily.   SPIRIVA  HANDIHALER 18 MCG inhalation capsule Place 1 capsule into inhaler and inhale daily.   Vaginal Lubricant (REPLENS) GEL Place 1 application vaginally 3 (three) times daily as needed. Apply to vagina and vulva as needed for itching. Use on days that you do not use clobetasol   VENTOLIN HFA 108 (90 Base) MCG/ACT inhaler Inhale 2 puffs into the lungs every 6 (six) hours as needed.     Allergies:   Ace inhibitors and Fluticasone   Social History   Socioeconomic History   Marital status: Married    Spouse name: Not on file   Number of children: Not on file   Years of education: Not on file   Highest education level: Not on file  Occupational History   Not on file  Tobacco Use   Smoking status: Former    Packs/day: 0.50    Types: Cigarettes    Quit date: 08/11/2019    Years since quitting: 1.4   Smokeless tobacco: Never  Vaping Use   Vaping Use: Never used  Substance and Sexual Activity   Alcohol use: No   Drug use: No   Sexual activity: Yes  Other Topics Concern   Not on file  Social History Narrative   Not on file   Social Determinants of Health   Financial Resource Strain: Not on file  Food Insecurity: Not on file  Transportation Needs: Not on file  Physical Activity: Not on file  Stress: Not on file  Social Connections: Not on file     Family History: The patient's family history is negative for Breast cancer.  ROS:   Please see the history of present illness.     All other systems reviewed and are negative.  EKGs/Labs/Other Studies Reviewed:    The following studies were reviewed today:   EKG:  EKG is  ordered today.  The ekg ordered today demonstrates normal sinus rhythm, normal ECG.  Recent Labs: 01/02/2021: ALT 9 01/21/2021: BUN 11; Creatinine, Ser 0.67; Hemoglobin 9.3; Platelets 284; Potassium 4.5; Sodium 135 01/22/2021: Magnesium 2.0; TSH 0.064  Recent Lipid Panel    Component Value Date/Time   CHOL 144 01/22/2021 0747   TRIG 92 01/22/2021 0747    HDL 47 01/22/2021 0747   CHOLHDL 3.1 01/22/2021 0747   VLDL 18 01/22/2021 0747   LDLCALC 79 01/22/2021 0747     Risk Assessment/Calculations:          Physical Exam:    VS:  BP 128/60 (BP Location: Right Arm, Patient Position: Sitting, Cuff Size: Normal)   Pulse 79   Ht 5\' 5"  (1.651 m)   Wt 170 lb (77.1 kg)   SpO2 97%   BMI 28.29 kg/m     Wt Readings from Last 3  Encounters:  01/27/21 170 lb (77.1 kg)  01/21/21 163 lb (73.9 kg)  01/06/21 173 lb (78.5 kg)     GEN:  Well nourished, well developed in no acute distress HEENT: Normal NECK: No JVD; No carotid bruits LYMPHATICS: No lymphadenopathy CARDIAC: RRR, faint systolic murmur RESPIRATORY:  Clear to auscultation without rales, wheezing or rhonchi  ABDOMEN: Soft, non-tender, non-distended MUSCULOSKELETAL:  No edema; No deformity  SKIN: Warm and dry NEUROLOGIC:  Alert and oriented x 3 PSYCHIATRIC:  Normal affect   ASSESSMENT:    1. Precordial pain   2. Coronary artery disease involving native coronary artery of native heart, unspecified whether angina present   3. Primary hypertension   4. Pure hypercholesterolemia   5. Dyspnea on exertion   6. Chest pain, unspecified type    PLAN:    In order of problems listed above:  Chest pain, coronary artery calcification.  Recent echo with normal systolic and diastolic function.  Get Lexiscan Myoview to evaluate presence of ischemia. Three-vessel coronary artery calcification, echo with normal EF.  Lexiscan as above.  Continue aspirin 81 mg, Lipitor 80.  Okay for sublingual nitro for now, consider Imdur if chest pain continues. Hypertension, BP controlled.  Continue Norvasc Hyperlipidemia, cholesterol controlled, continue Lipitor 80 mg daily. Dyspnea on exertion, Myoview as above, echo with normal EF.  If cardiac work-up is unrevealing, will recommend pulm eval for possible emphysema due to long history of smoking.  Follow-up after [facility].      Shared  Decision Making/Informed Consent The risks [chest pain, shortness of breath, cardiac arrhythmias, dizziness, blood pressure fluctuations, myocardial infarction, stroke/transient ischemic attack, nausea, vomiting, allergic reaction, radiation exposure, metallic taste sensation and life-threatening complications (estimated to be 1 in 10,000)], benefits (risk stratification, diagnosing coronary artery disease, treatment guidance) and alternatives of a nuclear stress test were discussed in detail with Ms. Rasmus and she agrees to proceed.    Medication Adjustments/Labs and Tests Ordered: Current medicines are reviewed at length with the patient today.  Concerns regarding medicines are outlined above.  Orders Placed This Encounter  Procedures   NM Myocar Multi W/Spect W/Wall Motion / EF   Cardiac Stress Test: Informed Consent Details: Physician/Practitioner Attestation; Transcribe to consent form and obtain patient signature   EKG 12-Lead   No orders of the defined types were placed in this encounter.   Patient Instructions  Medication Instructions:  Your physician recommends that you continue on your current medications as directed. Please refer to the Current Medication list given to you today.  *If you need a refill on your cardiac medications before your next appointment, please call your pharmacy*   Lab Work: None ordered If you have labs (blood work) drawn today and your tests are completely normal, you will receive your results only by: MyChart Message (if you have MyChart) OR A paper copy in the mail If you have any lab test that is abnormal or we need to change your treatment, we will call you to review the results.   Testing/Procedures: Your physician has requested that you have a lexiscan myoview. For further information please visit [email or web address] Please follow instruction sheet, as given.    Follow-Up: At [facility], you and your health needs are our priority.   As part of our continuing mission to provide you with exceptional heart care, we have created designated Provider Care Teams.  These Care Teams include your primary Cardiologist (physician) and Advanced Practice Providers (APPs -  Physician Assistants and Nurse Practitioners) who  all work together to provide you with the care you need, when you need it.  We recommend signing up for the patient portal called "MyChart".  Sign up information is provided on this After Visit Summary.  MyChart is used to connect with patients for Virtual Visits (Telemedicine).  Patients are able to view lab/test results, encounter notes, upcoming appointments, etc.  Non-urgent messages can be sent to your provider as well.   To learn more about what you can do with MyChart, go to [email or web address].    Your next appointment:   After stress test  The format for your next appointment:   In Person  Provider:   You may see Debbe Odea, MD or one of the following Advanced Practice Providers on your designated Care Team:   Nicolasa Ducking, NP Eula Listen, PA-C Cadence Fransico Michael, PA-C:1}    Other Instructions [facility] MYOVIEW  Your caregiver has ordered a Stress Test with nuclear imaging. The purpose of this test is to evaluate the blood supply to your heart muscle. This procedure is referred to as a "Non-Invasive Stress Test." This is because other than having an IV started in your vein, nothing is inserted or "invades" your body. Cardiac stress tests are done to find areas of poor blood flow to the heart by determining the extent of coronary artery disease (CAD). Some patients exercise on a treadmill, which naturally increases the blood flow to your heart, while others who are  unable to walk on a treadmill due to physical limitations have a pharmacologic/chemical stress agent called Lexiscan . This medicine will mimic walking on a treadmill by temporarily increasing your coronary blood flow.   Please note: these test  may take anywhere between 2-4 hours to complete  PLEASE REPORT TO [facility] MEDICAL MALL ENTRANCE  THE VOLUNTEERS AT THE FIRST DESK WILL DIRECT YOU WHERE TO GO  Date of Procedure:_____________________________________  Arrival Time for Procedure:______________________________  Instructions regarding medication:   __X__ : Hold diabetes medication morning of procedure  PLEASE NOTIFY THE OFFICE AT LEAST 24 HOURS IN ADVANCE IF YOU ARE UNABLE TO KEEP YOUR APPOINTMENT.  [phone] AND  PLEASE NOTIFY NUCLEAR MEDICINE AT [facility] AT LEAST 24 HOURS IN ADVANCE IF YOU ARE UNABLE TO KEEP YOUR APPOINTMENT. [phone]  How to prepare for your Myoview test:  Do not eat or drink after midnight No caffeine for 24 hours prior to test No smoking 24 hours prior to test. Your medication may be taken with water.  If your doctor stopped a medication because of this test, do not take that medication. Ladies, please do not wear dresses.  Skirts or pants are appropriate. Please wear a short sleeve shirt. No perfume, cologne or lotion. Wear comfortable walking shoes. No heels!      Signed, Debbe Odea, MD  01/27/2021 12:33 PM    [facility]

## 2021-02-10 ENCOUNTER — Other Ambulatory Visit: Payer: Self-pay

## 2021-02-10 ENCOUNTER — Encounter
Admission: RE | Admit: 2021-02-10 | Discharge: 2021-02-10 | Disposition: A | Discharge status: Home / Self Care | Payer: Medicare Other | Source: Ambulatory Visit | Attending: Cardiology | Admitting: Cardiology

## 2021-02-10 DIAGNOSIS — R079 Chest pain, unspecified: Secondary | ICD-10-CM | POA: Diagnosis present

## 2021-02-10 DIAGNOSIS — I2510 Atherosclerotic heart disease of native coronary artery without angina pectoris: Secondary | ICD-10-CM | POA: Insufficient documentation

## 2021-02-10 LAB — NM MYOCAR MULTI W/SPECT W/WALL MOTION / EF
Estimated workload: 1
Exercise duration (min): 0 min
Exercise duration (sec): 0 s
LV dias vol: 47 mL (ref 46–106)
LV sys vol: 16 mL
MPHR: 148 {beats}/min
Nuc Stress EF: 66 %
Peak HR: 102 {beats}/min
Percent HR: 68 %
Rest HR: 90 {beats}/min
Rest Nuclear Isotope Dose: 10.9 mCi
SDS: 0
SRS: 0
SSS: 0
Stress Nuclear Isotope Dose: 32.6 mCi
TID: 0.95

## 2021-02-10 MED ORDER — TECHNETIUM TC 99M TETROFOSMIN IV KIT
10.0000 | PACK | Freq: Once | INTRAVENOUS | Status: AC | PRN
  Administered 2021-02-10: 10.88 via INTRAVENOUS

## 2021-02-10 MED ORDER — REGADENOSON 0.4 MG/5ML IV SOLN
0.4000 mg | Freq: Once | INTRAVENOUS | Status: AC
  Administered 2021-02-10: 0.4 mg via INTRAVENOUS

## 2021-02-10 MED ORDER — TECHNETIUM TC 99M TETROFOSMIN IV KIT
32.5900 | PACK | Freq: Once | INTRAVENOUS | Status: AC | PRN
  Administered 2021-02-10: 32.59 via INTRAVENOUS

## 2021-02-16 ENCOUNTER — Ambulatory Visit: Payer: Medicare Other | Admitting: Nurse Practitioner

## 2021-02-16 NOTE — Progress Notes (Signed)
Cardiology Clinic Note   Patient Name: Erica Estrada Date of Encounter: 02/17/2021  Primary Care Provider:  Bunnie Pion, FNP Primary Cardiologist:  Kate Sable, MD  Patient Profile    70 year old female with a history of hypertension, hyperlipidemia,  precordial chest pain, type 2 diabetes, former tobacco use, GERD, DDD, asthma, depression, hepatitis, and PTSD who presents for follow-up related to chest pain and shortness of breath.   Past Medical History    Past Medical History:  Diagnosis Date   Arthritis    Asthma    Coronary artery calcification    Noted on prior CTA chest (01/2021)   Diabetes mellitus without complication ([place])    Former tobacco use    Graves' disease    Hepatitis 1995   Hep C.  Treated.   Hyperlipidemia    Hypertension    Precordial chest pain    MV 02/2021 low risk, no significant ischemia   Past Surgical History:  Procedure Laterality Date   ABDOMINAL HYSTERECTOMY     EYE SURGERY      Allergies  Allergies  Allergen Reactions   Ace Inhibitors Cough    Chronic cough for 3 months. Trial off lisinopril starting 05/11/2011 effective in stopping cough.   Fluticasone Other (See Comments)    choking    History of Present Illness    70 year old female with the above past medical history including hypertension, hyperlipidemia,  precordial chest pain, type 2 diabetes, former tobacco use, GERD, DDD, asthma, depression, Graves disease, hepatitis C, and PTSD.  She established care with our office on 01/27/2021 after presenting to the ED on 01/21/2021 with complaints of chest pain/tightness and shortness of breath following a stressful event. CTA chest was negative for PE, but showed 3 vessel coronary artery calcification and atherosclerosis of the thoracic aorta. Echocardiogram showed EF 60-65%, no RWMA, trivial MV regurgitation. Of note, she has taken nitroglycerin as needed for years due to chest pain. She also has occasional dyspnea on  exertion not associated with chest pain. Her symptoms resolved after a couple of hours, she was discharged home, and advised to follow-up with cardiology as an outpatient. Follow-up Lexiscan myoview on 02/10/2021, was low risk, with no significant ischemia, aortic atherosclerosis and mild coronary calcification present.    She presents today for follow-up. Since her last visit she has been doing well overall from a cardiac standpoint.  At baseline chronic mild dyspnea on exertion.  She has a long history of tobacco use and quit smoking over a year ago, however she still has exposure to secondhand smoke in her home. She reports two brief episodes of chest discomfort with activity which she describes as a hot sensation in her mid upper chest.  She took nitroglycerin during 1 of these episodes with some relief.  Her symptoms lasted for less than 5 minutes.  She thinks that the sensation she felt may have been related to acid reflux. Overall, she reports feeling well and is looking forward to the holidays. She denies palpitations, pnd, orthopnea, n, v, dizziness, syncope, edema, weight gain, or early satiety.   Home Medications    Current Outpatient Medications  Medication Sig Dispense Refill   acetaminophen (TYLENOL) 650 MG CR tablet Take 1,300 mg by mouth 2 (two) times daily as needed for pain.     amLODipine (NORVASC) 10 MG tablet Take 1 tablet (10 mg total) by mouth daily. 30 tablet 0   Artificial Saliva (BIOTENE MOISTURIZING MOUTH MT) Use as  directed 1-2 sprays in the mouth or throat 4 (four) times daily as needed.     ASPERCREME LIDOCAINE 4 % CREA Apply 1 application topically 2 (two) times daily. Apply to back and knees prn for pain     ASPIRIN 81 PO Take by mouth daily.     atorvastatin (LIPITOR) 80 MG tablet Take 1 tablet (80 mg total) by mouth daily. 30 tablet 0   calcium carbonate (TUMS - DOSED IN MG ELEMENTAL CALCIUM) 500 MG chewable tablet Chew 2 tablets by mouth 2 (two) times daily as needed  for indigestion or heartburn.     camphor-menthol (SARNA) lotion Apply 1 application topically as needed for itching.     chlorhexidine (PERIDEX) 0.12 % solution Use as directed 15 mLs in the mouth or throat 2 (two) times daily as needed. Swish for 30 seconds then spit     Cholecalciferol 25 MCG (1000 UT) capsule Take 1,000 Units by mouth daily.     clobetasol cream (TEMOVATE) 0.05 % Apply 1 application topically 2 (two) times a week. Apply to vulva     clotrimazole (LOTRIMIN) 1 % cream Apply 1 application topically 2 (two) times daily.     empagliflozin (JARDIANCE) 10 MG TABS tablet Take 10 mg by mouth daily.     ferrous sulfate 325 (65 FE) MG tablet Take 1 tablet (325 mg total) by mouth daily. 90 tablet 0   folic acid (FOLVITE) 1 MG tablet Take 1 tablet (1 mg total) by mouth daily. 90 tablet 0   insulin degludec (TRESIBA FLEXTOUCH) 200 UNIT/ML FlexTouch Pen Inject 60 Units into the skin at bedtime.     isosorbide mononitrate (IMDUR) 30 MG 24 hr tablet Take 0.5 tablets (15 mg total) by mouth daily. 45 tablet 3   metFORMIN (GLUCOPHAGE) 500 MG tablet Take 1,000 mg by mouth 2 (two) times daily with a meal.     methimazole (TAPAZOLE) 5 MG tablet Take 2.5 mg by mouth daily.     mometasone (NASONEX) 50 MCG/ACT nasal spray Place 2 sprays into the nose daily as needed.     nitroGLYCERIN (NITROSTAT) 0.4 MG SL tablet Place 0.4 mg under the tongue every 5 (five) minutes as needed for chest pain.     omeprazole (PRILOSEC) 40 MG capsule Take 1 capsule by mouth daily.     PARoxetine (PAXIL) 40 MG tablet Take 1 tablet (40 mg total) by mouth every morning. 30 tablet 0   pregabalin (LYRICA) 100 MG capsule Take 100 mg by mouth 2 (two) times daily.     SPIRIVA HANDIHALER 18 MCG inhalation capsule Place 1 capsule into inhaler and inhale daily.     Vaginal Lubricant (REPLENS) GEL Place 1 application vaginally 3 (three) times daily as needed. Apply to vagina and vulva as needed for itching. Use on days that you do  not use clobetasol     VENTOLIN HFA 108 (90 Base) MCG/ACT inhaler Inhale 2 puffs into the lungs every 6 (six) hours as needed.     No current facility-administered medications for this visit.     Family History    Family History  Problem Relation Age of Onset   Breast cancer Neg Hx    She indicated that the status of her neg hx is unknown.  Social History    Social History   Socioeconomic History   Marital status: Married    Spouse name: Not on file   Number of children: Not on file   Years of education: Not on  file   Highest education level: Not on file  Occupational History   Not on file  Tobacco Use   Smoking status: Former    Packs/day: 0.50    Types: Cigarettes    Quit date: 08/11/2019    Years since quitting: 1.5   Smokeless tobacco: Never  Vaping Use   Vaping Use: Never used  Substance and Sexual Activity   Alcohol use: No   Drug use: No   Sexual activity: Yes  Other Topics Concern   Not on file  Social History Narrative   Not on file   Social Determinants of Health   Financial Resource Strain: Not on file  Food Insecurity: Not on file  Transportation Needs: Not on file  Physical Activity: Not on file  Stress: Not on file  Social Connections: Not on file  Intimate Partner Violence: Not on file     Review of Systems    General:  No chills, fever, night sweats or weight changes.  Cardiovascular:  Atypical chest discomfort, chronic mild dyspnea on exertion. No edema, orthopnea, palpitations, paroxysmal nocturnal dyspnea. Dermatological: No rash, lesions/masses Respiratory: No cough, dyspnea Urologic: No hematuria, dysuria Abdominal: No nausea, vomiting, diarrhea, bright red blood per rectum, melena, or hematemesis Neurologic: No visual changes, wkns, changes in mental status. All other systems reviewed and are otherwise negative except as noted above.   Physical Exam    VS:  BP 120/60   Pulse 84   Ht 5\' 5"  (1.651 m)   Wt 168 lb (76.2 kg)    SpO2 96%   BMI 27.96 kg/m       GEN: Well nourished, well developed, in no acute distress. HEENT: normal. Neck: Supple, no JVD, carotid bruits, or masses. Cardiac: RRR, no murmurs, rubs, or gallops. No clubbing, cyanosis, edema.  Radials/DP/PT 2+ and equal bilaterally.  Respiratory:  Respirations regular and unlabored, clear to auscultation bilaterally. GI: Soft, nontender, nondistended, BS + x 4. MS: no deformity or atrophy. Skin: warm and dry, no rash. Neuro:  Strength and sensation are intact. Psych: Normal affect.  Accessory Clinical Findings    ECG personally reviewed by me today- NSR, 84 bpm, nonspecific T-wave abnormality, noted on prior ekg - No acute changes  Lab Results  Component Value Date   WBC 11.4 (H) 01/21/2021   HGB 9.3 (L) 01/21/2021   HCT 31.7 (L) 01/21/2021   MCV 76.0 (L) 01/21/2021   PLT 284 01/21/2021   Lab Results  Component Value Date   CREATININE 0.67 01/21/2021   BUN 11 01/21/2021   NA 135 01/21/2021   K 4.5 01/21/2021   CL 106 01/21/2021   CO2 22 01/21/2021   Lab Results  Component Value Date   ALT 9 01/02/2021   AST 34 01/02/2021   ALKPHOS 120 01/02/2021   BILITOT <0.2 01/02/2021   Lab Results  Component Value Date   CHOL 144 01/22/2021   HDL 47 01/22/2021   LDLCALC 79 01/22/2021   TRIG 92 01/22/2021   CHOLHDL 3.1 01/22/2021    Lab Results  Component Value Date   HGBA1C 9.1 (H) 01/21/2021    Assessment & Plan   1.  Chest pain: Stable with a history of sublingual nitroglycerin use as needed.  CTA chest 01/2021 negative for PE, but showed 3 vessel coronary artery calcification. Follow-up [facility] showed no significant ischemia. Reassuring. She reports 2 episodes of atypical chest discomfort since her Myoview; she thinks her symptoms may be related to GERD.  EKG today unchanged  from prior. After discussion of possible further ischemic evaluation with cardiac catheterization, and by shared decision making, she declines further  ischemic evaluation at this time. She is agreeable to start isosorbide mononitrate 15 mg daily to see if this helps her symptoms as she has found SL nitroglycerin helpful in the past.   2. Coronary artery calcification: Recent CTA chest and MV as above. Will add Imdur.  She declines further ischemic evaluation at this time. Continue aspirin 81 mg daily, Lipitor 80 mg daily.  3. Dyspnea on exertion: Chronic, mild, and stable.  With recent reassuring [facility], will refer to pulmonology for evaluation for possible chronic lung disease in setting of prior tobacco use and current secondhand smoke exposure.   4. Hypertension: Blood pressure well controlled, 120/60 in office today.  Continue amlodipine 10 mg daily.   5. Hyperlipidemia LDL goal <70:  LDL 79 on 01/22/2021.  Continue atorvastatin 80 mg daily.  6.  Type 2 diabetes: A1c 9.1 on 01/21/2021.  She is on Jardiance 10 mg daily, metformin 500 mg twice daily, and insulin.  Managed by PCP.  7. Disposition: Start Imdur 15 mg daily.  Refer to pulmonology.  Follow-up with Dr. Garen Lah or APP in 6 months.  Lenna Sciara, NP 02/17/2021, 5:03 PM

## 2021-02-17 ENCOUNTER — Other Ambulatory Visit: Payer: Self-pay

## 2021-02-17 ENCOUNTER — Encounter: Payer: Self-pay | Admitting: Nurse Practitioner

## 2021-02-17 ENCOUNTER — Ambulatory Visit (INDEPENDENT_AMBULATORY_CARE_PROVIDER_SITE_OTHER): Payer: Medicare Other | Admitting: Nurse Practitioner

## 2021-02-17 VITALS — BP 120/60 | HR 84 | Ht 65.0 in | Wt 168.0 lb

## 2021-02-17 DIAGNOSIS — R079 Chest pain, unspecified: Secondary | ICD-10-CM | POA: Diagnosis not present

## 2021-02-17 DIAGNOSIS — E118 Type 2 diabetes mellitus with unspecified complications: Secondary | ICD-10-CM

## 2021-02-17 DIAGNOSIS — I10 Essential (primary) hypertension: Secondary | ICD-10-CM | POA: Diagnosis not present

## 2021-02-17 DIAGNOSIS — E785 Hyperlipidemia, unspecified: Secondary | ICD-10-CM

## 2021-02-17 DIAGNOSIS — I2510 Atherosclerotic heart disease of native coronary artery without angina pectoris: Secondary | ICD-10-CM

## 2021-02-17 DIAGNOSIS — Z794 Long term (current) use of insulin: Secondary | ICD-10-CM

## 2021-02-17 DIAGNOSIS — R0609 Other forms of dyspnea: Secondary | ICD-10-CM | POA: Diagnosis not present

## 2021-02-17 DIAGNOSIS — I2584 Coronary atherosclerosis due to calcified coronary lesion: Secondary | ICD-10-CM | POA: Diagnosis not present

## 2021-02-17 MED ORDER — ISOSORBIDE MONONITRATE ER 30 MG PO TB24: 15.0000 mg | ORAL_TABLET | Freq: Every day | ORAL | 3 refills | Status: DC

## 2021-02-17 NOTE — Patient Instructions (Signed)
Medication Instructions:  Your physician has recommended you make the following change in your medication:   START Isosorbide mononitrate 30 mg and take one half tablet (15 mg) once a day  *If you need a refill on your cardiac medications before your next appointment, please call your pharmacy*   Lab Work: None  If you have labs (blood work) drawn today and your tests are completely normal, you will receive your results only by: MyChart Message (if you have MyChart) OR A paper copy in the mail If you have any lab test that is abnormal or we need to change your treatment, we will call you to review the results.   Testing/Procedures: None   Follow-Up: At [facility], you and your health needs are our priority.  As part of our continuing mission to provide you with exceptional heart care, we have created designated Provider Care Teams.  These Care Teams include your primary Cardiologist (physician) and Advanced Practice Providers (APPs -  Physician Assistants and Nurse Practitioners) who all work together to provide you with the care you need, when you need it.  We recommend signing up for the patient portal called "MyChart".  Sign up information is provided on this After Visit Summary.  MyChart is used to connect with patients for Virtual Visits (Telemedicine).  Patients are able to view lab/test results, encounter notes, upcoming appointments, etc.  Non-urgent messages can be sent to your provider as well.   To learn more about what you can do with MyChart, go to [email or web address].    Your next appointment:   6 month(s)  The format for your next appointment:   In Person  Provider:   Debbe Odea, MD or Nicolasa Ducking, NP

## 2021-02-21 ENCOUNTER — Other Ambulatory Visit: Payer: Self-pay

## 2021-02-21 ENCOUNTER — Ambulatory Visit (INDEPENDENT_AMBULATORY_CARE_PROVIDER_SITE_OTHER): Payer: Medicare Other | Admitting: Podiatry

## 2021-02-21 ENCOUNTER — Encounter: Payer: Self-pay | Admitting: Podiatry

## 2021-02-21 DIAGNOSIS — M79675 Pain in left toe(s): Secondary | ICD-10-CM | POA: Diagnosis not present

## 2021-02-21 DIAGNOSIS — E119 Type 2 diabetes mellitus without complications: Secondary | ICD-10-CM | POA: Diagnosis not present

## 2021-02-21 DIAGNOSIS — M79674 Pain in right toe(s): Secondary | ICD-10-CM | POA: Diagnosis not present

## 2021-02-21 DIAGNOSIS — B351 Tinea unguium: Secondary | ICD-10-CM | POA: Diagnosis not present

## 2021-02-21 NOTE — Progress Notes (Signed)
°  Subjective:  Patient ID: Erica Estrada, female    DOB: 11/22/1950,  MRN: [id number]  Chief Complaint  Patient presents with   Nail Problem    Nail trim    70 y.o. female returns for the above complaint.  Patient presents with thickened elongated dystrophic toenails x10.  Pain on palpation.  She would like to have them debrided down.  She is a diabetic with last A1c of 9.1.  Patient is here for diabetic foot exam as well.  Objective:  There were no vitals filed for this visit. Podiatric Exam: Vascular: dorsalis pedis and posterior tibial pulses are palpable bilateral. Capillary return is immediate. Temperature gradient is WNL. Skin turgor WNL  Sensorium: Normal Semmes Weinstein monofilament test. Normal tactile sensation bilaterally. Nail Exam: Pt has thick disfigured discolored nails with subungual debris noted bilateral entire nail hallux through fifth toenails.  Pain on palpation to the nails. Ulcer Exam: There is no evidence of ulcer or pre-ulcerative changes or infection. Orthopedic Exam: Muscle tone and strength are WNL. No limitations in general ROM. No crepitus or effusions noted.  Skin: No Porokeratosis. No infection or ulcers    Assessment & Plan:   1. Pain due to onychomycosis of toenails of both feet   2. Type 2 diabetes mellitus without complication, without long-term current use of insulin (HCC)     Patient was evaluated and treated and all questions answered.  Onychomycosis with pain  -Nails palliatively debrided as below. -Educated on self-care -Patient is a medium risk for developing diabetic foot ulcer given she has uncontrolled A1c.  Procedure: Nail Debridement Rationale: pain  Type of Debridement: manual, sharp debridement. Instrumentation: Nail nipper, rotary burr. Number of Nails: 10  Procedures and Treatment: Consent by patient was obtained for treatment procedures. The patient understood the discussion of treatment and procedures well. All questions  were answered thoroughly reviewed. Debridement of mycotic and hypertrophic toenails, 1 through 5 bilateral and clearing of subungual debris. No ulceration, no infection noted.  Return Visit-Office Procedure: Patient instructed to return to the office for a follow up visit 3 months for continued evaluation and treatment.  Nicholes Rough, DPM    Return in about 3 months (around 05/22/2021).

## 2021-02-23 ENCOUNTER — Other Ambulatory Visit: Payer: Self-pay

## 2021-02-23 ENCOUNTER — Ambulatory Visit (INDEPENDENT_AMBULATORY_CARE_PROVIDER_SITE_OTHER): Payer: Medicare Other | Admitting: Pulmonary Disease

## 2021-02-23 ENCOUNTER — Encounter: Payer: Self-pay | Admitting: Pulmonary Disease

## 2021-02-23 VITALS — BP 120/70 | HR 92 | Temp 97.0°F | Ht 65.0 in | Wt 163.4 lb

## 2021-02-23 DIAGNOSIS — R911 Solitary pulmonary nodule: Secondary | ICD-10-CM

## 2021-02-23 DIAGNOSIS — R0602 Shortness of breath: Secondary | ICD-10-CM

## 2021-02-23 DIAGNOSIS — J449 Chronic obstructive pulmonary disease, unspecified: Secondary | ICD-10-CM

## 2021-02-23 NOTE — Progress Notes (Signed)
Subjective:    Patient ID: Erica Estrada, female    DOB: 08-25-1950, 70 y.o.   MRN: [id number] Chief Complaint  Patient presents with   Consult    Cough since the weather change, occasional wheeze    HPI This is a 70 year old former smoker (difficult to quantitate as patient will not elaborate) who presents for evaluation of cough "since the weather change".  She is kindly referred by Doylene Bode, FNP.  She notes occasional wheezing.  Occasional shortness of breath.  She is currently on Spiriva and as needed albuterol.  She states that this does pretty well for her shortness of breath.  She states she has had pulmonary evaluation before but cannot tell me when nor who saw her.  Reviewing her "Care Everywhere" records it appears that this was an evaluation at [facility] in [place], [facility] with [facility].  She was evaluated by Dr. Philis Fendt, this evaluation was in 2017.  At the time she was complaining of wheezing particularly when she lay down at night.  She however had noted improvement with the Spiriva and albuterol.  She continues to state that this helps her.  She is actually not sure why she is visiting [place] today.  She does not endorse any fevers, chills or sweats.  Cough has been dry mostly in the mornings with no exacerbation during the day.  No weight loss or anorexia.  No other complaint.  History of tension is somewhat difficult due to the patient's multiple digressions.  PFTs performed in 2017: Pulmonary Function Test (PFT) Latest Ref Rng & Units 01/24/2016  FVC  2.33 L FVC % 78.4  FEV1 1.79 L FEV1 %  78.1  FEV1/FVC % 76.87  TLC PRE L 3.32  TLC % PRE PRED % 65.4  RV PRE L 0.97  RV % PRE PRED % 45.4  DLCO PRE ml/(min*mmHg) 13.55  DLCO % PRE PRED % 75.1  Essentially normal PFTs in 2017.   She had a CT angio chest performed on 21 January 2021 that showed a 4 mm nodule in the right upper lobe.  Review of Systems A 10 point review of systems was  performed and it is as noted above otherwise negative.  Past Medical History:  Diagnosis Date   Arthritis    Asthma    Coronary artery calcification    Noted on prior CTA chest (01/2021)   Diabetes mellitus without complication (HCC)    Former tobacco use    Graves' disease    Hepatitis 1995   Hep C.  Treated.   Hyperlipidemia    Hypertension    Precordial chest pain    MV 02/2021 low risk, no significant ischemia   Past Surgical History:  Procedure Laterality Date   ABDOMINAL HYSTERECTOMY     EYE SURGERY     Family History  Problem Relation Age of Onset   Breast cancer Neg Hx    Social History   Tobacco Use   Smoking status: Former    Packs/day: 0.50    Types: Cigarettes    Quit date: 08/11/2019    Years since quitting: 1.5   Smokeless tobacco: Never  Substance Use Topics   Alcohol use: No   Allergies  Allergen Reactions   Ace Inhibitors Cough    Chronic cough for 3 months. Trial off lisinopril starting 05/11/2011 effective in stopping cough.   Fluticasone Other (See Comments)    choking   Current Meds  Medication Sig  acetaminophen (TYLENOL) 650 MG CR tablet Take 1,300 mg by mouth 2 (two) times daily as needed for pain.   amLODipine (NORVASC) 10 MG tablet Take 1 tablet (10 mg total) by mouth daily.   Artificial Saliva (BIOTENE MOISTURIZING MOUTH MT) Use as directed 1-2 sprays in the mouth or throat 4 (four) times daily as needed.   ASPERCREME LIDOCAINE 4 % CREA Apply 1 application topically 2 (two) times daily. Apply to back and knees prn for pain   ASPIRIN 81 PO Take by mouth daily.   atorvastatin (LIPITOR) 80 MG tablet Take 1 tablet (80 mg total) by mouth daily.   calcium carbonate (TUMS - DOSED IN MG ELEMENTAL CALCIUM) 500 MG chewable tablet Chew 2 tablets by mouth 2 (two) times daily as needed for indigestion or heartburn.   camphor-menthol (SARNA) lotion Apply 1 application topically as needed for itching.   chlorhexidine (PERIDEX) 0.12 % solution Use as  directed 15 mLs in the mouth or throat 2 (two) times daily as needed. Swish for 30 seconds then spit   Cholecalciferol 25 MCG (1000 UT) capsule Take 1,000 Units by mouth daily.   clobetasol cream (TEMOVATE) 0.05 % Apply 1 application topically 2 (two) times a week. Apply to vulva   clotrimazole (LOTRIMIN) 1 % cream Apply 1 application topically 2 (two) times daily.   empagliflozin (JARDIANCE) 10 MG TABS tablet Take 10 mg by mouth daily.   ferrous sulfate 325 (65 FE) MG tablet Take 1 tablet (325 mg total) by mouth daily.   folic acid (FOLVITE) 1 MG tablet Take 1 tablet (1 mg total) by mouth daily.   insulin degludec (TRESIBA FLEXTOUCH) 200 UNIT/ML FlexTouch Pen Inject 60 Units into the skin at bedtime.   isosorbide mononitrate (IMDUR) 30 MG 24 hr tablet Take 0.5 tablets (15 mg total) by mouth daily.   metFORMIN (GLUCOPHAGE) 500 MG tablet Take 1,000 mg by mouth 2 (two) times daily with a meal.   methimazole (TAPAZOLE) 5 MG tablet Take 2.5 mg by mouth daily.   mometasone (NASONEX) 50 MCG/ACT nasal spray Place 2 sprays into the nose daily as needed.   nitroGLYCERIN (NITROSTAT) 0.4 MG SL tablet Place 0.4 mg under the tongue every 5 (five) minutes as needed for chest pain.   omeprazole (PRILOSEC) 40 MG capsule Take 1 capsule by mouth daily.   PARoxetine (PAXIL) 40 MG tablet Take 1 tablet (40 mg total) by mouth every morning.   pregabalin (LYRICA) 100 MG capsule Take 100 mg by mouth 2 (two) times daily.   SPIRIVA HANDIHALER 18 MCG inhalation capsule Place 1 capsule into inhaler and inhale daily.   Vaginal Lubricant (REPLENS) GEL Place 1 application vaginally 3 (three) times daily as needed. Apply to vagina and vulva as needed for itching. Use on days that you do not use clobetasol   VENTOLIN HFA 108 (90 Base) MCG/ACT inhaler Inhale 2 puffs into the lungs every 6 (six) hours as needed.   Immunization History  Administered Date(s) Administered   Hepatitis B, adult 11/19/2014, 12/29/2015   Influenza  Inj Mdck Quad Pf 12/26/2017   Influenza, High Dose Seasonal PF 11/19/2014, 12/29/2015   Influenza,inj,Quad PF,6+ Mos 11/10/2013   Influenza-Unspecified 12/26/2010, 12/20/2011, 11/28/2012, 11/19/2014, 12/29/2015   Pneumococcal Conjugate-13 06/08/2014   Pneumococcal Polysaccharide-23 12/20/2011   Tdap 04/02/2012   Zoster, Live 10/27/2014       Objective:   Physical Exam BP 120/70 (BP Location: Left Arm, Patient Position: Sitting, Cuff Size: Normal)    Pulse 92  Temp (!) 97 F (36.1 C) (Oral)    Ht 5\' 5"  (1.651 m)    Wt 163 lb 6.4 oz (74.1 kg)    SpO2 98%    BMI 27.19 kg/m  GENERAL: Well-developed well-nourished woman, no acute distress, fully ambulatory.  No conversational dyspnea. HEAD: Normocephalic, atraumatic.  EYES: Prosthetic left eye. MOUTH: Nose/mouth/throat not examined due to masking requirements for COVID 19. NECK: Supple. No thyromegaly. Trachea midline. No JVD.  No adenopathy. PULMONARY: Good air entry bilaterally.  No adventitious sounds. CARDIOVASCULAR: S1 and S2. Regular rate and rhythm.  Grade 1/6 systolic ejection murmur left sternal border. ABDOMEN: Benign. MUSCULOSKELETAL: No joint deformity, no clubbing, no edema.  NEUROLOGIC: No focal deficit, no gait disturbance, speech is fluent. SKIN: Intact,warm,dry. PSYCH: Mood and behavior normal  Representative image from CT scan performed 21 January 2021 showing a minute 4 mm right upper lobe nodule:     Assessment & Plan:     ICD-10-CM   1. SOB (shortness of breath)  R06.02 Pulmonary Function Test [facility] Only   Reassess with PFTs Continue current medical regimen for now    2. COPD suggested by initial evaluation (HCC)  J44.9    PFTs should help clarify this issue Continue Spiriva and albuterol for now    3. Nodule of upper lobe of right lung - 64mm  R91.1    Recommend CT follow-up in 12 months     Orders Placed This Encounter  Procedures   Pulmonary Function Test [facility] Only    Standing Status:   Future     Standing Expiration Date:   02/23/2022    Order Specific Question:   Full PFT: includes the following: basic spirometry, spirometry pre & post bronchodilator, diffusion capacity (DLCO), lung volumes    Answer:   Full PFT    Order Specific Question:   This test can only be performed at    Answer:   [facility]    For the time being we will continue Spiriva and albuterol.  We have ordered pulmonary function testing to better assess pulmonary function.  With regards to her very minute nodule in the right upper lung we will follow this in 12 months time with a noncontrasted chest CT.  This will be ordered at a later date.  I have instructed the patient to follow-up in 3 months time she is to contact [facility] prior to that time should any new difficulties arise.  [place], MD Advanced [facility]ch[facility]    *This note was dictated using voice recognition software/Dragon.  Despite best efforts to proofread, errors can occur which can change the meaning. Any transcriptional errors that result from this process are unintentional and may not be fully corrected at the time of dictation.

## 2021-02-23 NOTE — Patient Instructions (Signed)
For the time being we are going to continue Spiriva and as needed albuterol.  These are the inhalers you are using now.  Are getting some breathing tests this will tell us about the health status of your lungs.  You have a very tiny spot (nodule) in your lung that does not raise concern at present.  However, we will check another lung scan in a year.  This will be ordered at a later date.  We will see you in follow-up in 3 months time call sooner should any new problems arise.

## 2021-02-27 ENCOUNTER — Other Ambulatory Visit: Admission: RE | Admit: 2021-02-27 | Payer: Medicare Other | Source: Ambulatory Visit

## 2021-02-27 ENCOUNTER — Telehealth: Payer: Self-pay | Admitting: Pulmonary Disease

## 2021-02-27 NOTE — Telephone Encounter (Signed)
Patient did not show for covid test today.  PFT will need to be rescheduled.   Synetta Fail, please advise. Thanks

## 2021-02-28 ENCOUNTER — Ambulatory Visit: Payer: Medicare Other | Attending: Pulmonary Disease

## 2021-03-07 NOTE — Telephone Encounter (Signed)
Rhonda left a message asking the patient to call back and reschedule her PFT and Covid Test

## 2021-03-09 ENCOUNTER — Encounter: Payer: Self-pay | Admitting: Pulmonary Disease

## 2021-03-15 NOTE — Telephone Encounter (Signed)
Erica Estrada has her Covid Test scheduled on 04/04/21 and PFT scheduled on 04/05/21 @ 1:00pm appt reminder mailed to patient

## 2021-03-29 ENCOUNTER — Telehealth: Payer: Self-pay | Admitting: Pulmonary Disease

## 2021-03-29 NOTE — Telephone Encounter (Signed)
Lm for covid test reminder.   [facility]

## 2021-03-29 NOTE — Telephone Encounter (Signed)
Noted  

## 2021-04-03 ENCOUNTER — Telehealth: Payer: Self-pay

## 2021-04-03 NOTE — Telephone Encounter (Signed)
Attempted TC to patient again today re: last bottle of Rifampin.  Phone not working.Richmond Campbell, RN    TC with patient's daughter. She will call her mom and call TB RN back to schedule a time for patient to come in for last bottle of Rifampin and LFTs .Richmond Campbell, RN

## 2021-04-04 ENCOUNTER — Other Ambulatory Visit: Payer: Self-pay

## 2021-04-04 ENCOUNTER — Other Ambulatory Visit
Admission: RE | Admit: 2021-04-04 | Discharge: 2021-04-04 | Disposition: A | Discharge status: Home / Self Care | Payer: Medicare Other | Source: Ambulatory Visit | Attending: Pulmonary Disease | Admitting: Pulmonary Disease

## 2021-04-04 DIAGNOSIS — Z01812 Encounter for preprocedural laboratory examination: Secondary | ICD-10-CM | POA: Insufficient documentation

## 2021-04-04 DIAGNOSIS — Z20822 Contact with and (suspected) exposure to covid-19: Secondary | ICD-10-CM | POA: Insufficient documentation

## 2021-04-05 ENCOUNTER — Ambulatory Visit: Payer: Medicare Other | Attending: Pulmonary Disease

## 2021-04-05 ENCOUNTER — Telehealth: Payer: Self-pay | Admitting: Pulmonary Disease

## 2021-04-05 LAB — SARS CORONAVIRUS 2 (TAT 6-24 HRS): SARS Coronavirus 2: NEGATIVE

## 2021-04-05 NOTE — Telephone Encounter (Signed)
Spoke to patient, who stated that she would like to reschedule PFT. She is not feeling well today.  Synetta Fail, please advise. Thanks

## 2021-04-05 NOTE — Telephone Encounter (Signed)
TC from patient.  Patient has an appt with TB RN this morning but woke with an upset stomach.  R/s last Rifampin visit for 04/12/21.  Offered Monday 04/10/21 but patient stated she had another appt. Richmond Campbell, RN

## 2021-04-05 NOTE — Telephone Encounter (Signed)
When we get PFT appts for February I will call and get her rescheduled

## 2021-04-12 ENCOUNTER — Ambulatory Visit (LOCAL_COMMUNITY_HEALTH_CENTER): Payer: Medicare Other

## 2021-04-12 VITALS — Ht 63.0 in | Wt 163.0 lb

## 2021-04-12 DIAGNOSIS — R7612 Nonspecific reaction to cell mediated immunity measurement of gamma interferon antigen response without active tuberculosis: Secondary | ICD-10-CM

## 2021-04-13 ENCOUNTER — Other Ambulatory Visit: Payer: Medicare Other

## 2021-04-13 DIAGNOSIS — R7612 Nonspecific reaction to cell mediated immunity measurement of gamma interferon antigen response without active tuberculosis: Secondary | ICD-10-CM

## 2021-04-13 NOTE — Progress Notes (Signed)
Rifampin 300mg  #60 dispensed to patient.  TB RN will mail completion paperwork to patient. This is patient's last bottle of Rifampin. , RN

## 2021-04-13 NOTE — Progress Notes (Signed)
LFT's today for end of LTBI tx Richmond Campbell, RN

## 2021-04-13 NOTE — Telephone Encounter (Signed)
I have spoke with Erica Estrada and her Covid Test has been rescheduled for 04/17/21 2 10:00am and PFT rescheduled on 04/18/21 at 2:00pm

## 2021-04-14 LAB — HEPATIC FUNCTION PANEL
ALT: 6 IU/L (ref 0–32)
AST: 17 IU/L (ref 0–40)
Albumin: 3.9 g/dL (ref 3.7–4.7)
Alkaline Phosphatase: 88 IU/L (ref 44–121)
Bilirubin Total: <0.2 mg/dL (ref 0.0–1.2)
Bilirubin, Direct: <0.1 mg/dL (ref 0.00–0.40)
Total Protein: 6.9 g/dL (ref 6.0–8.5)

## 2021-04-17 ENCOUNTER — Telehealth: Payer: Self-pay

## 2021-04-17 NOTE — Telephone Encounter (Signed)
Called and spoke to patient in regards to her upcoming Covid test on 2/9. Nothing further needed

## 2021-04-20 ENCOUNTER — Other Ambulatory Visit
Admission: RE | Admit: 2021-04-20 | Discharge: 2021-04-20 | Disposition: A | Discharge status: Home / Self Care | Payer: Medicare Other | Source: Ambulatory Visit | Attending: Pulmonary Disease | Admitting: Pulmonary Disease

## 2021-04-20 ENCOUNTER — Other Ambulatory Visit: Payer: Self-pay

## 2021-04-20 DIAGNOSIS — Z20822 Contact with and (suspected) exposure to covid-19: Secondary | ICD-10-CM | POA: Insufficient documentation

## 2021-04-20 DIAGNOSIS — Z01812 Encounter for preprocedural laboratory examination: Secondary | ICD-10-CM | POA: Diagnosis present

## 2021-04-21 ENCOUNTER — Ambulatory Visit: Payer: Medicare Other | Attending: Pulmonary Disease

## 2021-04-21 DIAGNOSIS — R0602 Shortness of breath: Secondary | ICD-10-CM | POA: Diagnosis present

## 2021-04-21 DIAGNOSIS — Z87891 Personal history of nicotine dependence: Secondary | ICD-10-CM | POA: Diagnosis not present

## 2021-04-21 LAB — SARS CORONAVIRUS 2 (TAT 6-24 HRS): SARS Coronavirus 2: NEGATIVE

## 2021-04-21 MED ORDER — ALBUTEROL SULFATE (2.5 MG/3ML) 0.083% IN NEBU
2.5000 mg | INHALATION_SOLUTION | Freq: Once | RESPIRATORY_TRACT | Status: AC
  Administered 2021-04-21: 2.5 mg via RESPIRATORY_TRACT
  Filled 2021-04-21: qty 3

## 2021-06-01 ENCOUNTER — Ambulatory Visit: Payer: Medicare Other | Admitting: Podiatry

## 2021-06-02 ENCOUNTER — Encounter: Payer: Self-pay | Admitting: Pulmonary Disease

## 2021-06-02 ENCOUNTER — Ambulatory Visit (INDEPENDENT_AMBULATORY_CARE_PROVIDER_SITE_OTHER): Payer: Medicare Other | Admitting: Pulmonary Disease

## 2021-06-02 ENCOUNTER — Other Ambulatory Visit: Payer: Self-pay

## 2021-06-02 VITALS — BP 120/60 | HR 101 | Temp 97.0°F | Ht 65.0 in | Wt 145.8 lb

## 2021-06-02 DIAGNOSIS — R911 Solitary pulmonary nodule: Secondary | ICD-10-CM | POA: Diagnosis not present

## 2021-06-02 DIAGNOSIS — J449 Chronic obstructive pulmonary disease, unspecified: Secondary | ICD-10-CM | POA: Diagnosis not present

## 2021-06-02 NOTE — Patient Instructions (Signed)
Continue using your current inhalers. ? ?Your lung function was very good. ? ?We will repeat another CT scan of the chest in November to follow-up on your small spot in the lung. ? ? ?We will see you in follow-up after that CT scan of the chest is done. ?

## 2021-06-02 NOTE — Progress Notes (Signed)
Subjective:    Patient ID: Erica Estrada, female    DOB: [birth year], 71 y.o.   MRN: [id number] Patient Care Team: Bunnie Pion, FNP as PCP - General (Family Medicine) Kate Sable, MD as PCP - Cardiology (Cardiology)  Chief Complaint  Patient presents with   Follow-up   HPI Patient is a 71 year old former smoker (16.5 PY) and a history as noted below, who presents for follow-up on the issue of a lung nodule.  She carries a diagnosis of COPD and is maintained on Spiriva and as needed albuterol.  She was fully evaluated on 23 February 2021 and at that time she had pulmonary function testing ordered and was instructed to continue Spiriva and as needed albuterol.  She has a 4 mm right upper lobe lung nodule that is also being followed expectantly the patient will need repeat CT chest December 2023.  Patient presents today without any complaint.  She has usually cough in the mornings productive of white sputum which clears very quickly after she first expectorates.  No cough during the rest of the day.  She has had mild shortness of breath on exertion of longstanding but notes that this is no worse and perhaps better.  No orthopnea, no paroxysmal nocturnal dyspnea, no lower extremity edema or calf tenderness.  Overall she feels well and looks well.   DATA 01/21/2021 CT angio chest: No PE, no acute lung parenchymal abnormality.  Three-vessel coronary artery calcifications.  Small solid pulmonary nodule in the right upper lobe measuring 4 mm consider follow-up film in 12 months. 04/21/2021 PFTs: FEV1 1.69 L or 101% predicted, FVC 2.19 L or 92% predicted, FEV1/FVC 77%, small airways component, no bronchodilator response.  Lung volumes low end of normal.  Diffusion capacity normal.  Review of Systems A 10 point review of systems was performed and it is as noted above otherwise negative.   Current Meds  Medication Sig   acetaminophen (TYLENOL) 650 MG CR tablet Take 1,300 mg by mouth 2  (two) times daily as needed for pain.   amLODipine (NORVASC) 10 MG tablet Take 1 tablet (10 mg total) by mouth daily.   Artificial Saliva (BIOTENE MOISTURIZING MOUTH MT) Use as directed 1-2 sprays in the mouth or throat 4 (four) times daily as needed.   ASPERCREME LIDOCAINE 4 % CREA Apply 1 application topically 2 (two) times daily. Apply to back and knees prn for pain   ASPIRIN 81 PO Take by mouth daily.   atorvastatin (LIPITOR) 80 MG tablet Take 1 tablet (80 mg total) by mouth daily.   calcium carbonate (TUMS - DOSED IN MG ELEMENTAL CALCIUM) 500 MG chewable tablet Chew 2 tablets by mouth 2 (two) times daily as needed for indigestion or heartburn.   camphor-menthol (SARNA) lotion Apply 1 application topically as needed for itching.   chlorhexidine (PERIDEX) 0.12 % solution Use as directed 15 mLs in the mouth or throat 2 (two) times daily as needed. Swish for 30 seconds then spit   Cholecalciferol 25 MCG (1000 UT) capsule Take 1,000 Units by mouth daily.   clobetasol cream (TEMOVATE) [id number] % Apply 1 application topically 2 (two) times a week. Apply to vulva   clotrimazole (LOTRIMIN) 1 % cream Apply 1 application topically 2 (two) times daily.   empagliflozin (JARDIANCE) 10 MG TABS tablet Take 10 mg by mouth daily.   insulin degludec (TRESIBA FLEXTOUCH) 200 UNIT/ML FlexTouch Pen Inject 60 Units into the skin at bedtime.   metFORMIN (GLUCOPHAGE) 500 MG tablet Take 1,000  mg by mouth 2 (two) times daily with a meal.   methimazole (TAPAZOLE) 5 MG tablet Take 2.5 mg by mouth daily.   mometasone (NASONEX) 50 MCG/ACT nasal spray Place 2 sprays into the nose daily as needed.   nitroGLYCERIN (NITROSTAT) 0.4 MG SL tablet Place 0.4 mg under the tongue every 5 (five) minutes as needed for chest pain.   omeprazole (PRILOSEC) 40 MG capsule Take 1 capsule by mouth daily.   PARoxetine (PAXIL) 40 MG tablet Take 1 tablet (40 mg total) by mouth every morning.   pregabalin (LYRICA) 100 MG capsule Take 100 mg by mouth  2 (two) times daily.   SPIRIVA HANDIHALER 18 MCG inhalation capsule Place 1 capsule into inhaler and inhale daily.   Vaginal Lubricant (REPLENS) GEL Place 1 application vaginally 3 (three) times daily as needed. Apply to vagina and vulva as needed for itching. Use on days that you do not use clobetasol   VENTOLIN HFA 108 (90 Base) MCG/ACT inhaler Inhale 2 puffs into the lungs every 6 (six) hours as needed.       Objective:   Physical Exam BP 120/60 (BP Location: Left Arm, Patient Position: Sitting, Cuff Size: Normal)   Pulse (!) 101   Temp (!) 97 F (36.1 C) (Oral)   Ht 5\' 5"  (1.651 m)   Wt 145 lb 12.8 oz (66.1 kg)   SpO2 96%   BMI 24.26 kg/m   GENERAL: Well-developed well-nourished woman, no acute distress, fully ambulatory.  No conversational dyspnea. HEAD: Normocephalic, atraumatic.  EYES: Enucleated left eye. MOUTH: Nose/mouth/throat not examined due to masking requirements for COVID 19. NECK: Supple. No thyromegaly. Trachea midline. No JVD.  No adenopathy. PULMONARY: Good air entry bilaterally.  No adventitious sounds. CARDIOVASCULAR: S1 and S2. Regular rate and rhythm.  Grade 1/6 systolic ejection murmur left sternal border. ABDOMEN: Benign. MUSCULOSKELETAL: No joint deformity, no clubbing, no edema.  NEUROLOGIC: No focal deficit, no gait disturbance, speech is fluent. SKIN: Intact,warm,dry. PSYCH: Mood and behavior normal     Assessment & Plan:     ICD-10-CM   1. Stage 1 mild COPD by GOLD classification (Ackerman)  J44.9    Continue Spiriva and as needed albuterol Lung function well-preserved    2. Nodule of upper lobe of right lung - 65mm  R91.1 CT CHEST WO CONTRAST   Repeat imaging December 2023     Orders Placed This Encounter  Procedures   CT CHEST WO CONTRAST    Standing Status:   Future    Number of Occurrences:   1    Standing Expiration Date:   06/03/2022    Scheduling Instructions:     Schedule for November    Order Specific Question:   Pref[facility]?    [facility]   Patient's lung function is fairly well-preserved.  Continue Spiriva and albuterol as needed.  We will repeat another CT chest in the November/December timeframe.  We will see her in follow-up after CT is done.  Renold Don, MD Advanced Bronchoscopy PCCM [facility]    *This note was dictated using voice recognition software/Dragon.  Despite best efforts to proofread, errors can occur which can change the meaning. Any transcriptional errors that result from this process are unintentional and may not be fully corrected at the time of dictation.

## 2021-06-15 ENCOUNTER — Encounter: Payer: Self-pay | Admitting: Podiatry

## 2021-06-15 ENCOUNTER — Ambulatory Visit (INDEPENDENT_AMBULATORY_CARE_PROVIDER_SITE_OTHER): Payer: Medicare Other | Admitting: Podiatry

## 2021-06-15 DIAGNOSIS — M79675 Pain in left toe(s): Secondary | ICD-10-CM | POA: Diagnosis not present

## 2021-06-15 DIAGNOSIS — E119 Type 2 diabetes mellitus without complications: Secondary | ICD-10-CM | POA: Diagnosis not present

## 2021-06-15 DIAGNOSIS — M79674 Pain in right toe(s): Secondary | ICD-10-CM

## 2021-06-15 DIAGNOSIS — B351 Tinea unguium: Secondary | ICD-10-CM | POA: Diagnosis not present

## 2021-06-15 NOTE — Progress Notes (Signed)
This patient returns to my office for at risk foot care.  This patient requires this care by a professional since this patient will be at risk due to having type 2 diabetes.  This patient is unable to cut nails herself since the patient cannot reach her nails.These nails are painful walking and wearing shoes.  This patient presents for at risk foot care today. ? ?General Appearance  Alert, conversant and in no acute stress. ? ?Vascular  Dorsalis pedis and posterior tibial  pulses are palpable  bilaterally.  Capillary return is within normal limits  bilaterally. Temperature is within normal limits  bilaterally. ? ?Neurologic  Senn-Weinstein monofilament wire test within normal limits  bilaterally. Muscle power within normal limits bilaterally. ? ?Nails Thick disfigured discolored nails with subungual debris  from hallux to fifth toes bilaterally. No evidence of bacterial infection or drainage bilaterally. ? ?Orthopedic  No limitations of motion  feet .  No crepitus or effusions noted.  No bony pathology or digital deformities noted.  HAV  B/L.  Pes planus  B/L. ? ?Skin  normotropic skin with no porokeratosis noted bilaterally.  No signs of infections or ulcers noted.    ? ?Onychomycosis  Pain in right toes  Pain in left toes   ? ?Consent was obtained for treatment procedures.   Mechanical debridement of nails 1-5  bilaterally performed with a nail nipper.  Filed with dremel without incident. She states she is having pain in both feet.  She was told to make an appointment with medical podiatrists. ? ? ?Return office visit   3 months                  Told patient to return for periodic foot care and evaluation due to potential at risk complications. ? ? ?Helane Gunther DPM   ?

## 2021-06-20 ENCOUNTER — Telehealth: Payer: Self-pay | Admitting: *Deleted

## 2021-06-20 ENCOUNTER — Ambulatory Visit: Payer: Medicare Other | Admitting: Podiatry

## 2021-06-20 NOTE — Telephone Encounter (Signed)
"  I'm going to be late for my appointment.  I just want to know if I can get another appointment.  I over rested myself because I'm trying to move and stuff.  Please give me a call.  I'm sorry." ? ?I called and rescheduled her to 06/22/2021 at 8:45 am. ?

## 2021-06-22 ENCOUNTER — Ambulatory Visit (INDEPENDENT_AMBULATORY_CARE_PROVIDER_SITE_OTHER): Payer: Medicare Other | Admitting: Podiatry

## 2021-06-22 ENCOUNTER — Encounter: Payer: Self-pay | Admitting: Podiatry

## 2021-06-22 DIAGNOSIS — R202 Paresthesia of skin: Secondary | ICD-10-CM

## 2021-06-22 DIAGNOSIS — E119 Type 2 diabetes mellitus without complications: Secondary | ICD-10-CM | POA: Diagnosis not present

## 2021-06-22 DIAGNOSIS — R200 Anesthesia of skin: Secondary | ICD-10-CM

## 2021-06-22 DIAGNOSIS — Z8739 Personal history of other diseases of the musculoskeletal system and connective tissue: Secondary | ICD-10-CM | POA: Diagnosis not present

## 2021-06-22 NOTE — Progress Notes (Signed)
?Subjective:  ?Patient ID: Erica Estrada, female    DOB: Jul 19, 1950,  MRN: [id number] ? ?Chief Complaint  ?Patient presents with  ? Foot Pain  ?  Patient c/o numbness, tingling prickly feeling in both feet from midfoot to toes x 1 month  ? ? ?71 y.o. female presents with the above complaint.  Patient presents with numbness tingling sensation to both of her feet from the midfoot to the toes.  She has been on for 1 months has progressive gotten worse.  She states this hurts with ambulation.  The Lyrica helps.  She just wanted to get it evaluated.  She does have history of lower back pain as well.  She is a diabetic that is controlled with last A1c of 6.6.  She has not seen anyone as prior to seeing me for this. ? ? ?Review of Systems: Negative except as noted in the HPI. Denies N/V/F/Ch. ? ?Past Medical History:  ?Diagnosis Date  ? Arthritis   ? Asthma   ? Coronary artery calcification   ? Noted on prior CTA chest (01/2021)  ? Diabetes mellitus without complication (HCC)   ? Former tobacco use   ? Graves' disease   ? Hepatitis 1995  ? Hep C.  Treated.  ? Hyperlipidemia   ? Hypertension   ? Precordial chest pain   ? MV 02/2021 low risk, no significant ischemia  ? ? ?Current Outpatient Medications:  ?  acetaminophen (TYLENOL) 650 MG CR tablet, Take 1,300 mg by mouth 2 (two) times daily as needed for pain., Disp: , Rfl:  ?  amLODipine (NORVASC) 10 MG tablet, Take 1 tablet (10 mg total) by mouth daily., Disp: 30 tablet, Rfl: 0 ?  Artificial Saliva (BIOTENE MOISTURIZING MOUTH MT), Use as directed 1-2 sprays in the mouth or throat 4 (four) times daily as needed., Disp: , Rfl:  ?  ASPERCREME LIDOCAINE 4 % CREA, Apply 1 application topically 2 (two) times daily. Apply to back and knees prn for pain, Disp: , Rfl:  ?  ASPIRIN 81 PO, Take by mouth daily., Disp: , Rfl:  ?  atorvastatin (LIPITOR) 80 MG tablet, Take 1 tablet (80 mg total) by mouth daily., Disp: 30 tablet, Rfl: 0 ?  calcium carbonate (TUMS - DOSED IN MG ELEMENTAL  CALCIUM) 500 MG chewable tablet, Chew 2 tablets by mouth 2 (two) times daily as needed for indigestion or heartburn., Disp: , Rfl:  ?  camphor-menthol (SARNA) lotion, Apply 1 application topically as needed for itching., Disp: , Rfl:  ?  chlorhexidine (PERIDEX) 0.12 % solution, Use as directed 15 mLs in the mouth or throat 2 (two) times daily as needed. Swish for 30 seconds then spit, Disp: , Rfl:  ?  Cholecalciferol 25 MCG (1000 UT) capsule, Take 1,000 Units by mouth daily., Disp: , Rfl:  ?  clobetasol cream (TEMOVATE) 0.05 %, Apply 1 application topically 2 (two) times a week. Apply to vulva, Disp: , Rfl:  ?  clotrimazole (LOTRIMIN) 1 % cream, Apply 1 application topically 2 (two) times daily., Disp: , Rfl:  ?  empagliflozin (JARDIANCE) 10 MG TABS tablet, Take 10 mg by mouth daily., Disp: , Rfl:  ?  ferrous sulfate 325 (65 FE) MG tablet, Take 1 tablet (325 mg total) by mouth daily., Disp: 90 tablet, Rfl: 0 ?  insulin degludec (TRESIBA FLEXTOUCH) 200 UNIT/ML FlexTouch Pen, Inject 60 Units into the skin at bedtime., Disp: , Rfl:  ?  isosorbide mononitrate (IMDUR) 30 MG 24 hr tablet, Take 0.5 tablets (  15 mg total) by mouth daily., Disp: 45 tablet, Rfl: 3 ?  metFORMIN (GLUCOPHAGE) 500 MG tablet, Take 1,000 mg by mouth 2 (two) times daily with a meal., Disp: , Rfl:  ?  methimazole (TAPAZOLE) 5 MG tablet, Take 2.5 mg by mouth daily., Disp: , Rfl:  ?  mometasone (NASONEX) 50 MCG/ACT nasal spray, Place 2 sprays into the nose daily as needed., Disp: , Rfl:  ?  nitroGLYCERIN (NITROSTAT) 0.4 MG SL tablet, Place 0.4 mg under the tongue every 5 (five) minutes as needed for chest pain., Disp: , Rfl:  ?  omeprazole (PRILOSEC) 40 MG capsule, Take 1 capsule by mouth daily., Disp: , Rfl:  ?  PARoxetine (PAXIL) 40 MG tablet, Take 1 tablet (40 mg total) by mouth every morning., Disp: 30 tablet, Rfl: 0 ?  pregabalin (LYRICA) 100 MG capsule, Take 100 mg by mouth 2 (two) times daily., Disp: , Rfl:  ?  SPIRIVA HANDIHALER 18 MCG  inhalation capsule, Place 1 capsule into inhaler and inhale daily., Disp: , Rfl:  ?  Vaginal Lubricant (REPLENS) GEL, Place 1 application vaginally 3 (three) times daily as needed. Apply to vagina and vulva as needed for itching. Use on days that you do not use clobetasol, Disp: , Rfl:  ?  VENTOLIN HFA 108 (90 Base) MCG/ACT inhaler, Inhale 2 puffs into the lungs every 6 (six) hours as needed., Disp: , Rfl:  ? ?Social History  ? ?Tobacco Use  ?Smoking Status Former  ? Packs/day: 0.50  ? Types: Cigarettes  ? Quit date: 08/11/2019  ? Years since quitting: 1.8  ?Smokeless Tobacco Never  ? ? ?Allergies  ?Allergen Reactions  ? Ace Inhibitors Cough  ?  Chronic cough for 3 months. Trial off lisinopril starting 05/11/2011 effective in stopping cough.  ? Fluticasone Other (See Comments)  ?  choking  ? ?Objective:  ?There were no vitals filed for this visit. ?There is no height or weight on file to calculate BMI. ?Constitutional Well developed. ?Well nourished.  ?Vascular Dorsalis pedis pulses palpable bilaterally. ?Posterior tibial pulses palpable bilaterally. ?Capillary refill normal to all digits.  ?No cyanosis or clubbing noted. ?Pedal hair growth normal.  ?Neurologic Normal speech. ?Oriented to person, place, and time. ?Decreased but intact sensation to light touch grossly present bilaterally.  Negative Tinel's sign.  Negative common peroneal compression syndrome.  ?Dermatologic Numbness tingling noted to all of the toes subjectively.  Protective sensations intact.  No open wounds or lesion noted  ?Orthopedic: Normal joint ROM without pain or crepitus bilaterally. ?No visible deformities. ?No bony tenderness.  ? ?Radiographs: None ?Assessment:  ? ?1. Numbness and tingling   ?2. Type 2 diabetes mellitus without complication, without long-term current use of insulin (HCC)   ?3. History of back pain   ? ?Plan:  ?Patient was evaluated and treated and all questions answered. ? ?Bilateral numbness tingling to the toes with a  history of lower back pain ?-All questions and concerns were discussed with the patient in extensive detail.  I discussed with the patient that this could be diabetic related or lower back pain/arthritic pain.  I discussed with him that she could be developing stenosis leading to numbness tingling to all of her toes equally.  Positional changes can help.  I discussed with her to be see a spine specialist to be further evaluated she states understanding. ?-Continue taking Lyrica for the pain. ? ?No follow-ups on file. ?

## 2021-07-27 ENCOUNTER — Ambulatory Visit
Admission: RE | Admit: 2021-07-27 | Discharge: 2021-07-27 | Disposition: A | Discharge status: Home / Self Care | Payer: Medicare Other | Attending: Family Medicine | Admitting: Family Medicine

## 2021-07-27 ENCOUNTER — Other Ambulatory Visit: Payer: Self-pay | Admitting: Family Medicine

## 2021-07-27 ENCOUNTER — Ambulatory Visit
Admission: RE | Admit: 2021-07-27 | Discharge: 2021-07-27 | Disposition: A | Discharge status: Home / Self Care | Payer: Medicare Other | Source: Ambulatory Visit | Attending: Family Medicine | Admitting: Family Medicine

## 2021-07-27 DIAGNOSIS — R0781 Pleurodynia: Secondary | ICD-10-CM | POA: Diagnosis present

## 2021-07-27 DIAGNOSIS — M25511 Pain in right shoulder: Secondary | ICD-10-CM | POA: Insufficient documentation

## 2021-08-16 ENCOUNTER — Encounter: Payer: Self-pay | Admitting: *Deleted

## 2021-08-24 ENCOUNTER — Encounter: Payer: Self-pay | Admitting: Cardiology

## 2021-08-24 ENCOUNTER — Ambulatory Visit (INDEPENDENT_AMBULATORY_CARE_PROVIDER_SITE_OTHER): Payer: 59 | Admitting: Cardiology

## 2021-08-24 VITALS — BP 126/68 | HR 75 | Ht 64.0 in | Wt 147.0 lb

## 2021-08-24 DIAGNOSIS — I10 Essential (primary) hypertension: Secondary | ICD-10-CM

## 2021-08-24 DIAGNOSIS — E7800 Pure hypercholesterolemia, unspecified: Secondary | ICD-10-CM

## 2021-08-24 DIAGNOSIS — I2510 Atherosclerotic heart disease of native coronary artery without angina pectoris: Secondary | ICD-10-CM

## 2021-08-24 DIAGNOSIS — R0602 Shortness of breath: Secondary | ICD-10-CM | POA: Diagnosis not present

## 2021-08-24 NOTE — Progress Notes (Signed)
Cardiology Office Note:    Date:  08/24/2021   ID:  Erica Estrada, DOB 1950/06/28, MRN [id number]  PCP:  Bunnie Pion, FNP   [facility] Providers Cardiologist:  Kate Sable, MD     Referring MD: Bunnie Pion, FNP   Chief Complaint  Patient presents with   Follow-up    6 month follow up. Patient states that she has had chest pain about 2-3 days ago where she had to take Nitroglycerin.  Meds reviewed with patient     History of Present Illness:    Erica Estrada is a 71 y.o. female with a hx of CAD (coronary artery calcification on chest ct), hypertension, hyperlipidemia, diabetes, former smoker x40+ years who presents for follow-up.  Previously seen due to chest pain and shortness of breath.  Prior echocardiogram was normal, Lexiscan Myoview did not show any ischemia.  Tolerating aspirin, Lipitor.  Cholesterol controlled.  Blood pressure adequately controlled on amlodipine.  Work-up for lung etiology being performed by pulmonary medicine due to extensive smoking history.  Started on Imdur 15 mg daily for antianginal benefit, chest pain is well controlled.  Breathing is also much better since starting on inhalers by pulmonary medicine.   Prior notes CT angio chest 01/21/2021 showed three-vessel coronary artery calcifications. Echocardiogram 01/22/2021 EF 60 to 65% Lexiscan Myoview 02/2021 no evidence for ischemia, low risk scan  Past Medical History:  Diagnosis Date   Arthritis    Asthma    Coronary artery calcification    Noted on prior CTA chest (01/2021)   Diabetes mellitus without complication ([place])    Former tobacco use    Graves' disease    Hepatitis 1995   Hep C.  Treated.   Hyperlipidemia    Hypertension    Precordial chest pain    MV 02/2021 low risk, no significant ischemia    Past Surgical History:  Procedure Laterality Date   ABDOMINAL HYSTERECTOMY     EYE SURGERY      Current Medications: Current Meds  Medication Sig    acetaminophen (TYLENOL) 650 MG CR tablet Take 1,300 mg by mouth 2 (two) times daily as needed for pain.   amLODipine (NORVASC) 10 MG tablet Take 1 tablet (10 mg total) by mouth daily.   Artificial Saliva (BIOTENE MOISTURIZING MOUTH MT) Use as directed 1-2 sprays in the mouth or throat 4 (four) times daily as needed.   ASPERCREME LIDOCAINE 4 % CREA Apply 1 application topically 2 (two) times daily. Apply to back and knees prn for pain   ASPIRIN 81 PO Take by mouth daily.   atorvastatin (LIPITOR) 80 MG tablet Take 1 tablet (80 mg total) by mouth daily.   calcium carbonate (TUMS - DOSED IN MG ELEMENTAL CALCIUM) 500 MG chewable tablet Chew 2 tablets by mouth 2 (two) times daily as needed for indigestion or heartburn.   camphor-menthol (SARNA) lotion Apply 1 application topically as needed for itching.   chlorhexidine (PERIDEX) 0.12 % solution Use as directed 15 mLs in the mouth or throat 2 (two) times daily as needed. Swish for 30 seconds then spit   Cholecalciferol 25 MCG (1000 UT) capsule Take 1,000 Units by mouth daily.   clobetasol cream (TEMOVATE) [id number] % Apply 1 application topically 2 (two) times a week. Apply to vulva   clotrimazole (LOTRIMIN) 1 % cream Apply 1 application topically 2 (two) times daily.   empagliflozin (JARDIANCE) 10 MG TABS tablet Take 10 mg by mouth daily.   ferrous sulfate 325 (65 FE)  MG tablet Take 1 tablet (325 mg total) by mouth daily.   insulin degludec (TRESIBA FLEXTOUCH) 200 UNIT/ML FlexTouch Pen Inject 60 Units into the skin at bedtime.   isosorbide mononitrate (IMDUR) 30 MG 24 hr tablet Take 0.5 tablets (15 mg total) by mouth daily.   Lancets (ONETOUCH DELICA PLUS LANCET33G) MISC Apply topically.   metFORMIN (GLUCOPHAGE) 500 MG tablet Take 1,000 mg by mouth 2 (two) times daily with a meal.   methimazole (TAPAZOLE) 5 MG tablet Take 2.5 mg by mouth daily.   mometasone (NASONEX) 50 MCG/ACT nasal spray Place 2 sprays into the nose daily as needed.   nitroGLYCERIN  (NITROSTAT) 0.4 MG SL tablet Place 0.4 mg under the tongue every 5 (five) minutes as needed for chest pain.   omeprazole (PRILOSEC) 40 MG capsule Take 1 capsule by mouth daily.   ONETOUCH ULTRA test strip USE AS DIRECTED FOR BLOOD SUGAR TESTING THREE TIMES DAILY FOR DIABETES E11.9   PARoxetine (PAXIL) 40 MG tablet Take 1 tablet (40 mg total) by mouth every morning.   pregabalin (LYRICA) 100 MG capsule Take 100 mg by mouth 2 (two) times daily.   SPIRIVA HANDIHALER 18 MCG inhalation capsule Place 1 capsule into inhaler and inhale daily.   tiZANidine (ZANAFLEX) 2 MG tablet Take 2 mg by mouth 3 (three) times daily.   traZODone (DESYREL) 50 MG tablet Take 50 mg by mouth at bedtime.   Vaginal Lubricant (REPLENS) GEL Place 1 application vaginally 3 (three) times daily as needed. Apply to vagina and vulva as needed for itching. Use on days that you do not use clobetasol   VENTOLIN HFA 108 (90 Base) MCG/ACT inhaler Inhale 2 puffs into the lungs every 6 (six) hours as needed.     Allergies:   Ace inhibitors and Fluticasone   Social History   Socioeconomic History   Marital status: Married    Spouse name: Not on file   Number of children: Not on file   Years of education: Not on file   Highest education level: Not on file  Occupational History   Not on file  Tobacco Use   Smoking status: Former    Packs/day: 0.50    Types: Cigarettes    Quit date: 08/11/2019    Years since quitting: 2.0   Smokeless tobacco: Never  Vaping Use   Vaping Use: Never used  Substance and Sexual Activity   Alcohol use: No   Drug use: No   Sexual activity: Yes  Other Topics Concern   Not on file  Social History Narrative   Not on file   Social Determinants of Health   Financial Resource Strain: Not on file  Food Insecurity: Not on file  Transportation Needs: Not on file  Physical Activity: Not on file  Stress: Not on file  Social Connections: Not on file     Family History: The patient's family  history is negative for Breast cancer.  ROS:   Please see the history of present illness.     All other systems reviewed and are negative.  EKGs/Labs/Other Studies Reviewed:    The following studies were reviewed today:   EKG:  EKG is  ordered today.  The ekg ordered today demonstrates normal sinus rhythm, normal ECG.  Recent Labs: 01/21/2021: BUN 11; Creatinine, Ser 0.67; Hemoglobin 9.3; Platelets 284; Potassium 4.5; Sodium 135 01/22/2021: Magnesium 2.0; TSH 0.064 04/13/2021: ALT 6  Recent Lipid Panel    Component Value Date/Time   CHOL 144 01/22/2021 0747  TRIG 92 01/22/2021 0747   HDL 47 01/22/2021 0747   CHOLHDL 3.1 01/22/2021 0747   VLDL 18 01/22/2021 0747   LDLCALC 79 01/22/2021 0747     Risk Assessment/Calculations:          Physical Exam:    VS:  BP 126/68 (BP Location: Left Arm, Patient Position: Sitting, Cuff Size: Normal)   Pulse 75   Ht 5\' 4"  (1.626 m)   Wt 147 lb (66.7 kg)   SpO2 98%   BMI 25.23 kg/m     Wt Readings from Last 3 Encounters:  08/24/21 147 lb (66.7 kg)  06/02/21 145 lb 12.8 oz (66.1 kg)  04/13/21 163 lb (73.9 kg)     GEN:  Well nourished, well developed in no acute distress HEENT: Normal NECK: No JVD; No carotid bruits CARDIAC: RRR, faint systolic murmur RESPIRATORY:  Clear to auscultation without rales, wheezing or rhonchi  ABDOMEN: Soft, non-tender, non-distended MUSCULOSKELETAL:  No edema; No deformity  SKIN: Warm and dry NEUROLOGIC:  Alert and oriented x 3 PSYCHIATRIC:  Normal affect   ASSESSMENT:    1. Coronary artery disease involving native coronary artery of native heart, unspecified whether angina present   2. Primary hypertension   3. Pure hypercholesterolemia   4. Shortness of breath     PLAN:    In order of problems listed above:  CAD, coronary artery calcification.  Echo 01/2021 normal systolic and diastolic function, EF 60 to 65%.  Lexiscan Myoview with no evidence for ischemia.  Denies chest pain.   Continue aspirin 81 mg, Lipitor 80, Imdur 15 mg daily. Hypertension, BP controlled.  Continue Norvasc Hyperlipidemia, cholesterol controlled, continue Lipitor 80 mg daily. Shortness of breath is much improved since starting inhalers.  Follow-up yearly.      Medication Adjustments/Labs and Tests Ordered: Current medicines are reviewed at length with the patient today.  Concerns regarding medicines are outlined above.  Orders Placed This Encounter  Procedures   EKG 12-Lead   No orders of the defined types were placed in this encounter.    Patient Instructions  Medication Instructions:   Your physician recommends that you continue on your current medications as directed. Please refer to the Current Medication list given to you today.   *If you need a refill on your cardiac medications before your next appointment, please call your pharmacy*   Lab Work: None ordered If you have labs (blood work) drawn today and your tests are completely normal, you will receive your results only by: MyChart Message (if you have MyChart) OR A paper copy in the mail If you have any lab test that is abnormal or we need to change your treatment, we will call you to review the results.   Testing/Procedures: None ordered   Follow-Up: At [facility], you and your health needs are our priority.  As part of our continuing mission to provide you with exceptional heart care, we have created designated Provider Care Teams.  These Care Teams include your primary Cardiologist (physician) and Advanced Practice Providers (APPs -  Physician Assistants and Nurse Practitioners) who all work together to provide you with the care you need, when you need it.  We recommend signing up for the patient portal called "MyChart".  Sign up information is provided on this After Visit Summary.  MyChart is used to connect with patients for Virtual Visits (Telemedicine).  Patients are able to view lab/test results, encounter  notes, upcoming appointments, etc.  Non-urgent messages can be sent to  your provider as well.   To learn more about what you can do with MyChart, go to [email or web address].    Your next appointment:   1 year(s)  The format for your next appointment:   In Person  Provider:   You may see Kate Sable, MD or one of the following Advanced Practice Providers on your designated Care Team:   Murray Hodgkins, NP Christell Faith, PA-C Cadence Kathlen Mody, [place]    Other Instructions   Important Information About Sugar         Signed, Kate Sable, MD  08/24/2021 10:41 AM    [place]

## 2021-08-24 NOTE — Patient Instructions (Signed)

## 2021-09-14 ENCOUNTER — Ambulatory Visit (INDEPENDENT_AMBULATORY_CARE_PROVIDER_SITE_OTHER): Payer: 59 | Admitting: Podiatry

## 2021-09-14 ENCOUNTER — Encounter: Payer: Self-pay | Admitting: Podiatry

## 2021-09-14 DIAGNOSIS — M79675 Pain in left toe(s): Secondary | ICD-10-CM | POA: Diagnosis not present

## 2021-09-14 DIAGNOSIS — M79674 Pain in right toe(s): Secondary | ICD-10-CM

## 2021-09-14 DIAGNOSIS — B351 Tinea unguium: Secondary | ICD-10-CM

## 2021-09-14 DIAGNOSIS — E119 Type 2 diabetes mellitus without complications: Secondary | ICD-10-CM

## 2021-09-14 DIAGNOSIS — R200 Anesthesia of skin: Secondary | ICD-10-CM

## 2021-09-14 DIAGNOSIS — R202 Paresthesia of skin: Secondary | ICD-10-CM

## 2021-09-14 NOTE — Progress Notes (Signed)
This patient returns to my office for at risk foot care.  This patient requires this care by a professional since this patient will be at risk due to having diabetes with neuropathy.  This patient is unable to cut nails himself since the patient cannot reach his nails.These nails are painful walking and wearing shoes.  This patient presents for at risk foot care today.  General Appearance  Alert, conversant and in no acute stress.  Vascular  Dorsalis pedis and posterior tibial  pulses are palpable  bilaterally.  Capillary return is within normal limits  bilaterally. Temperature is within normal limits  bilaterally.  Neurologic  Senn-Weinstein monofilament wire test within normal limits  bilaterally. Muscle power within normal limits bilaterally.  Nails Thick disfigured discolored nails with subungual debris  from hallux to fifth toes bilaterally. No evidence of bacterial infection or drainage bilaterally.  Orthopedic  No limitations of motion  feet .  No crepitus or effusions noted.  No bony pathology or digital deformities noted.  Skin  normotropic skin with no porokeratosis noted bilaterally.  No signs of infections or ulcers noted.     Onychomycosis  Pain in right toes  Pain in left toes  Consent was obtained for treatment procedures.   Mechanical debridement of nails 1-5  bilaterally performed with a nail nipper.  Filed with dremel without incident.    Return office visit     3 months                Told patient to return for periodic foot care and evaluation due to potential at risk complications.   Cerra Eisenhower DPM  

## 2021-09-27 ENCOUNTER — Telehealth: Payer: Self-pay | Admitting: Cardiology

## 2021-09-27 NOTE — Telephone Encounter (Signed)
Spoke w/ pt.  She reports chest pain off and on for a couple of weeks. She reports that she is not doing anything when it starts, can be sitting down. She reports "sharp, stabbing pain"  on the left side under her breast that last about 2-3 minutes, she can press on her chest afterwards and the spot is tender.  She has been taking 1 nitro when it happens - pain doesn't last long enough to see if the nitro is working, but it makes her head feel funny. Pain does not radiate to her back or shoulder. She denies SOB or nausea. She was at her PCP's office in the waiting room yesterday when she had an episode - they told her to see cardiology. She would like an appt w/ MD or PA. Scheduled her to see Cadence next Wednesday, 10/04/21 w/ the understanding that is sx continue or worsen, to proceed to the nearest ED. She verbalizes understanding and is appreciative of the call.

## 2021-09-27 NOTE — Telephone Encounter (Signed)
Pt c/o of Chest Pain: STAT if CP now or developed within 24 hours  1. Are you having CP right now? no  2. Are you experiencing any other symptoms (ex. SOB, nausea, vomiting, sweating)? Nausea, SOB  3. How long have you been experiencing CP? About a week or two   4. Is your CP continuous or coming and going? Comes and goes   5. Have you taken Nitroglycerin? Not for two days  ? Patient went to another MD yesterday and mentioned these symptoms to the other provider. She was told to report them to her Cardiologist

## 2021-10-04 ENCOUNTER — Encounter: Payer: Self-pay | Admitting: Medical

## 2021-10-04 ENCOUNTER — Ambulatory Visit: Payer: 59 | Admitting: Medical

## 2021-10-04 NOTE — Progress Notes (Deleted)
Cardiology Office Note:    Date:  10/04/2021   ID:  Erica Estrada, DOB Sep 10, 1950, MRN [id number]  PCP:  Lorn Junes, FNP  [facility] Cardiologist:  Debbe Odea, MD  [facility] Electrophysiologist:  None   Referring MD: Lorn Junes, FNP   Chief Complaint: chest pain  History of Present Illness:    Erica Estrada is a 71 y.o. female with a hx of CAD, HTN, HLD, diabetes, former smoker who presents for follow-up.   CTA chest 01/2021 showed 3V coronary artery calcifications. Echo 01/2021 showed LVEF 60-65%. Lexiscan Myoview 02/2021 showed no evidence for ischemia, overall low risk scan.   Last seen 08/2021 and was overall stable from a cardiac perspective.   Today,   Past Medical History:  Diagnosis Date   Arthritis    Asthma    Coronary artery calcification    Noted on prior CTA chest (01/2021)   Diabetes mellitus without complication ([facility])    Former tobacco use    Graves' disease    Hepatitis 1995   Hep C.  Treated.   Hyperlipidemia    Hypertension    Precordial chest pain    MV 02/2021 low risk, no significant ischemia    Past Surgical History:  Procedure Laterality Date   ABDOMINAL HYSTERECTOMY     EYE SURGERY      Current Medications: No outpatient medications have been marked as taking for the 10/04/21 encounter (Appointment) with Fransico Michael, Jakiera Ehler H, PA-C.     Allergies:   Ace inhibitors and Fluticasone   Social History   Socioeconomic History   Marital status: Married    Spouse name: Not on file   Number of children: Not on file   Years of education: Not on file   Highest education level: Not on file  Occupational History   Not on file  Tobacco Use   Smoking status: Former    Packs/day: 0.50    Types: Cigarettes    Quit date: 08/11/2019    Years since quitting: 2.1   Smokeless tobacco: Never  Vaping Use   Vaping Use: Never used  Substance and Sexual Activity   Alcohol use: No   Drug use: No   Sexual activity: Yes   Other Topics Concern   Not on file  Social History Narrative   Not on file   Social Determinants of Health   Financial Resource Strain: Not on file  Food Insecurity: Not on file  Transportation Needs: Not on file  Physical Activity: Not on file  Stress: Not on file  Social Connections: Not on file     Family History: The patient's family history is negative for Breast cancer.  ROS:   Please see the history of present illness.     All other systems reviewed and are negative.  EKGs/Labs/Other Studies Reviewed:    The following studies were reviewed today:  Echo 01/2021  1. Left ventricular ejection fraction, by estimation, is 60 to 65%. The  left ventricle has normal function. The left ventricle has no regional  wall motion abnormalities. Left ventricular diastolic parameters were  normal.   2. Right ventricular systolic function is normal. The right ventricular  size is normal.   3. The mitral valve is normal in structure. Trivial mitral valve  regurgitation.   4. The aortic valve is normal in structure. Aortic valve regurgitation is  not visualized.   Myoview Lexsican 02/2021 Narrative & Impression  Pharmacological myocardial perfusion imaging study with no significant  ischemia  Normal wall motion, EF estimated at 68% No EKG changes concerning for ischemia at peak stress or in recovery. CT attenuation correction images with mild aortic atherosclerosis, mild coronary calcification Low risk scan     Signed, Dossie Arbour, MD, Ph.D [facility]    EKG:  EKG is *** ordered today.  The ekg ordered today demonstrates ***  Recent Labs: 01/21/2021: BUN 11; Creatinine, Ser 0.67; Hemoglobin 9.3; Platelets 284; Potassium 4.5; Sodium 135 01/22/2021: Magnesium 2.0; TSH 0.064 04/13/2021: ALT 6  Recent Lipid Panel    Component Value Date/Time   CHOL 144 01/22/2021 0747   TRIG 92 01/22/2021 0747   HDL 47 01/22/2021 0747   CHOLHDL 3.1 01/22/2021 0747   VLDL 18  01/22/2021 0747   LDLCALC 79 01/22/2021 0747     Risk Assessment/Calculations:   {Does this patient have ATRIAL FIBRILLATION?[phone]}   Physical Exam:    VS:  There were no vitals taken for this visit.    Wt Readings from Last 3 Encounters:  08/24/21 147 lb (66.7 kg)  06/02/21 145 lb 12.8 oz (66.1 kg)  04/13/21 163 lb (73.9 kg)     GEN: *** Well nourished, well developed in no acute distress HEENT: Normal NECK: No JVD; No carotid bruits LYMPHATICS: No lymphadenopathy CARDIAC: ***RRR, no murmurs, rubs, gallops RESPIRATORY:  Clear to auscultation without rales, wheezing or rhonchi  ABDOMEN: Soft, non-tender, non-distended MUSCULOSKELETAL:  No edema; No deformity  SKIN: Warm and dry NEUROLOGIC:  Alert and oriented x 3 PSYCHIATRIC:  Normal affect   ASSESSMENT:    No diagnosis found. PLAN:    In order of problems listed above:  CAD  HTN  HLD  SOB  Disposition: Follow up {follow up:15908} with ***   Shared Decision Making/Informed Consent   {Are you ordering a CV Procedure (e.g. stress test, cath, DCCV, TEE, etc)?   Press F2        :[id number]}    Signed, Keaton Stirewalt Ardelle Lesches  10/04/2021 8:31 AM    [facility]

## 2021-11-23 ENCOUNTER — Ambulatory Visit (INDEPENDENT_AMBULATORY_CARE_PROVIDER_SITE_OTHER): Payer: 59 | Admitting: Podiatry

## 2021-11-23 ENCOUNTER — Encounter: Payer: Self-pay | Admitting: Podiatry

## 2021-11-23 DIAGNOSIS — B351 Tinea unguium: Secondary | ICD-10-CM | POA: Diagnosis not present

## 2021-11-23 DIAGNOSIS — E119 Type 2 diabetes mellitus without complications: Secondary | ICD-10-CM | POA: Diagnosis not present

## 2021-11-23 DIAGNOSIS — M79674 Pain in right toe(s): Secondary | ICD-10-CM | POA: Diagnosis not present

## 2021-11-23 DIAGNOSIS — M79675 Pain in left toe(s): Secondary | ICD-10-CM | POA: Diagnosis not present

## 2021-11-23 DIAGNOSIS — R200 Anesthesia of skin: Secondary | ICD-10-CM

## 2021-11-23 DIAGNOSIS — R202 Paresthesia of skin: Secondary | ICD-10-CM

## 2021-11-23 NOTE — Progress Notes (Signed)
This patient returns to my office for at risk foot care.  This patient requires this care by a professional since this patient will be at risk due to having diabetes with neuropathy .  This patient is unable to cut nails herself since the patient cannot reach her nails.These nails are painful walking and wearing shoes.  This patient presents for at risk foot care today.  General Appearance  Alert, conversant and in no acute stress.  Vascular  Dorsalis pedis and posterior tibial  pulses are palpable  bilaterally.  Capillary return is within normal limits  bilaterally. Temperature is within normal limits  bilaterally.  Neurologic  Senn-Weinstein monofilament wire test within normal limits  bilaterally. Muscle power within normal limits bilaterally.  Nails Thick disfigured discolored nails with subungual debris  from hallux to fifth toes bilaterally. No evidence of bacterial infection or drainage bilaterally.  Orthopedic  No limitations of motion  feet .  No crepitus or effusions noted.  No bony pathology or digital deformities noted.  Skin  normotropic skin with no porokeratosis noted bilaterally.  No signs of infections or ulcers noted.     Onychomycosis  Pain in right toes  Pain in left toes  Consent was obtained for treatment procedures.   Mechanical debridement of nails 1-5  bilaterally performed with a nail nipper.  Filed with dremel without incident.    Return office   10 weeks              Told patient to return for periodic foot care and evaluation due to potential at risk complications.   Helane Gunther DPM

## 2022-02-13 ENCOUNTER — Ambulatory Visit
Admission: RE | Admit: 2022-02-13 | Discharge: 2022-02-13 | Disposition: A | Discharge status: Home / Self Care | Payer: Medicare Other | Source: Ambulatory Visit | Attending: Family Medicine | Admitting: Family Medicine

## 2022-02-13 DIAGNOSIS — R911 Solitary pulmonary nodule: Secondary | ICD-10-CM | POA: Diagnosis present

## 2022-03-01 ENCOUNTER — Ambulatory Visit (INDEPENDENT_AMBULATORY_CARE_PROVIDER_SITE_OTHER): Payer: Medicare Other | Admitting: Podiatry

## 2022-03-01 ENCOUNTER — Encounter: Payer: Self-pay | Admitting: Podiatry

## 2022-03-01 DIAGNOSIS — E119 Type 2 diabetes mellitus without complications: Secondary | ICD-10-CM

## 2022-03-01 DIAGNOSIS — M79674 Pain in right toe(s): Secondary | ICD-10-CM

## 2022-03-01 DIAGNOSIS — R202 Paresthesia of skin: Secondary | ICD-10-CM

## 2022-03-01 DIAGNOSIS — M79675 Pain in left toe(s): Secondary | ICD-10-CM

## 2022-03-01 DIAGNOSIS — R200 Anesthesia of skin: Secondary | ICD-10-CM | POA: Diagnosis not present

## 2022-03-01 DIAGNOSIS — B351 Tinea unguium: Secondary | ICD-10-CM

## 2022-03-01 NOTE — Progress Notes (Signed)
This patient returns to my office for at risk foot care.  This patient requires this care by a professional since this patient will be at risk due to having diabetes with neuropathy .  This patient is unable to cut nails herself since the patient cannot reach her nails.These nails are painful walking and wearing shoes.  This patient presents for at risk foot care today.  General Appearance  Alert, conversant and in no acute stress.  Vascular  Dorsalis pedis and posterior tibial  pulses are palpable  bilaterally.  Capillary return is within normal limits  bilaterally. Temperature is within normal limits  bilaterally.  Neurologic  Senn-Weinstein monofilament wire test within normal limits  bilaterally. Muscle power within normal limits bilaterally.  Nails Thick disfigured discolored nails with subungual debris  hallux nails  bilaterally. No evidence of bacterial infection or drainage bilaterally.  Orthopedic  No limitations of motion  feet .  No crepitus or effusions noted.  No bony pathology or digital deformities noted.  Skin  normotropic skin with no porokeratosis noted bilaterally.  No signs of infections or ulcers noted.     Onychomycosis  Pain in right toes  Pain in left toes  Consent was obtained for treatment procedures.   Mechanical debridement of nails 1-5  bilaterally performed with a nail nipper.  Filed with dremel without incident.    Return office   10 weeks              Told patient to return for periodic foot care and evaluation due to potential at risk complications.   Helane Gunther DPM

## 2022-03-28 ENCOUNTER — Ambulatory Visit: Payer: Medicare HMO | Admitting: Pulmonary Disease

## 2022-04-02 ENCOUNTER — Encounter: Payer: Self-pay | Admitting: Podiatry

## 2022-04-09 ENCOUNTER — Emergency Department
Admission: EM | Admit: 2022-04-09 | Discharge: 2022-04-09 | Discharge status: Against Medical Advice | Payer: Medicare HMO

## 2022-04-09 ENCOUNTER — Encounter: Payer: Self-pay | Admitting: Emergency Medicine

## 2022-04-09 ENCOUNTER — Emergency Department: Payer: Medicare HMO

## 2022-04-09 ENCOUNTER — Emergency Department
Admission: EM | Admit: 2022-04-09 | Discharge: 2022-04-09 | Disposition: A | Discharge status: Home / Self Care | Payer: Medicare HMO | Attending: Emergency Medicine | Admitting: Emergency Medicine

## 2022-04-09 DIAGNOSIS — R9082 White matter disease, unspecified: Secondary | ICD-10-CM | POA: Diagnosis not present

## 2022-04-09 DIAGNOSIS — W010XXA Fall on same level from slipping, tripping and stumbling without subsequent striking against object, initial encounter: Secondary | ICD-10-CM | POA: Insufficient documentation

## 2022-04-09 DIAGNOSIS — M542 Cervicalgia: Secondary | ICD-10-CM | POA: Insufficient documentation

## 2022-04-09 DIAGNOSIS — E049 Nontoxic goiter, unspecified: Secondary | ICD-10-CM | POA: Insufficient documentation

## 2022-04-09 DIAGNOSIS — M25551 Pain in right hip: Secondary | ICD-10-CM | POA: Insufficient documentation

## 2022-04-09 DIAGNOSIS — W19XXXA Unspecified fall, initial encounter: Secondary | ICD-10-CM

## 2022-04-09 NOTE — ED Triage Notes (Signed)
To triage via wheelchair with c/o "my entire right side is hurting after I fell last night." Pt reports going to answer door in socks last night and slipped.Reports pain in right hip and pain when turning neck. Denies hitting head. Denies use of blood thinners. Denies LOC. Denies syncopal episodes or dizziness prior to event.  Pt displays full ROM in all extremities

## 2022-04-09 NOTE — ED Provider Notes (Signed)
[facility] Provider Note  Patient Contact: 9:54 PM (approximate)   History   Fall   HPI  Erica Estrada is a 72 y.o. female presents to the emergency department after patient had a mechanical fall patient reports that she went to answer her door and socks last night and slipped.  She is complaining of neck pain and right hip pain.  Patient has been able to ambulate easily with her walker.  No chest pain, chest tightness or abdominal pain.      Physical Exam   Triage Vital Signs: ED Triage Vitals  Enc Vitals Group     BP 04/09/22 1939 125/89     Pulse Rate 04/09/22 1939 80     Resp 04/09/22 1939 20     Temp 04/09/22 1939 98.6 F (37 C)     Temp Source 04/09/22 1939 Oral     SpO2 04/09/22 1939 100 %     Weight 04/09/22 1940 148 lb (67.1 kg)     Height 04/09/22 1940 5\' 5"  (1.651 m)     Head Circumference --      Peak Flow --      Pain Score 04/09/22 1951 10     Pain Loc --      Pain Edu? --      Excl. in [place]? --     Most recent vital signs: Vitals:   04/09/22 1939  BP: 125/89  Pulse: 80  Resp: 20  Temp: 98.6 F (37 C)  SpO2: 100%     General: Alert and in no acute distress. Eyes:  PERRL. EOMI. Head: No acute traumatic findings ENT:      Nose: No congestion/rhinnorhea.      Mouth/Throat: Mucous membranes are moist. Neck: No stridor. No cervical spine tenderness to palpation.   Cardiovascular:  Good peripheral perfusion Respiratory: Normal respiratory effort without tachypnea or retractions. Lungs CTAB. Good air entry to the bases with no decreased or absent breath sounds. Gastrointestinal: Bowel sounds 4 quadrants. Soft and nontender to palpation. No guarding or rigidity. No palpable masses. No distention. No CVA tenderness. Musculoskeletal: Full range of motion to all extremities.  No pain with internal and external rotation of the right hip. Neurologic:  No gross focal neurologic deficits are appreciated.  Skin:   No rash  noted   ED Results / Procedures / Treatments   Labs (all labs ordered are listed, but only abnormal results are displayed) Labs Reviewed - No data to display      RADIOLOGY  I personally viewed and evaluated these images as part of my medical decision making, as well as reviewing the written report by the radiologist.  ED Provider Interpretation: No acute bony abnormality on x-ray of the right hip.  CTs of the head and cervical spine show no acute abnormality   PROCEDURES:  Critical Care performed: No  Procedures   MEDICATIONS ORDERED IN ED: Medications - No data to display   IMPRESSION / MDM / [place] / ED COURSE  I reviewed the triage vital signs and the nursing notes.                              Assessment and plan Fall 72 year old female presents to the emergency department after mechanical fall last night.  Vital signs were reassuring at triage.  On exam, patient alert and nontoxic-appearing and able to ambulate easily with her walker.  No evidence  of intracranial bleed, skull fracture or C-spine fracture on dedicated CTs.  No acute bony abnormality on x-ray of the right hip.  Patient was advised to follow-up with primary care provider as needed.  Return precautions were given to return with new or worsening symptoms.      FINAL CLINICAL IMPRESSION(S) / ED DIAGNOSES   Final diagnoses:  Fall, initial encounter     Rx / DC Orders   ED Discharge Orders     None        Note:  This document was prepared using Dragon voice recognition software and may include unintentional dictation errors.   Vallarie Mare [place], Hershal Coria 04/09/22 2157    Harvest Dark, MD 04/09/22 2250

## 2022-05-03 ENCOUNTER — Ambulatory Visit (INDEPENDENT_AMBULATORY_CARE_PROVIDER_SITE_OTHER): Payer: Medicare HMO | Admitting: Pulmonary Disease

## 2022-05-03 ENCOUNTER — Encounter: Payer: Self-pay | Admitting: Pulmonary Disease

## 2022-05-03 VITALS — BP 138/84 | HR 81 | Temp 97.3°F | Ht 65.0 in | Wt 150.4 lb

## 2022-05-03 DIAGNOSIS — J683 Other acute and subacute respiratory conditions due to chemicals, gases, fumes and vapors: Secondary | ICD-10-CM

## 2022-05-03 DIAGNOSIS — E0500 Thyrotoxicosis with diffuse goiter without thyrotoxic crisis or storm: Secondary | ICD-10-CM | POA: Diagnosis not present

## 2022-05-03 DIAGNOSIS — R0602 Shortness of breath: Secondary | ICD-10-CM

## 2022-05-03 DIAGNOSIS — Z87891 Personal history of nicotine dependence: Secondary | ICD-10-CM

## 2022-05-03 DIAGNOSIS — R911 Solitary pulmonary nodule: Secondary | ICD-10-CM

## 2022-05-03 NOTE — Patient Instructions (Signed)
Continue your medications as they are.  Continue follow-up with Dr. Lennox Grumbles.  We will see you back in a year or as needed.

## 2022-05-03 NOTE — Progress Notes (Signed)
Subjective:    Patient ID: Erica Estrada, female    DOB: March 07, 1951, 72 y.o.   MRN: [id number] Patient Care Team: Marguerita Merles, MD as PCP - General (Family Medicine) Kate Sable, MD as PCP - Cardiology (Cardiology)  Chief Complaint  Patient presents with   Follow-up    Nodule. Cough in the morning with white sputum. SOB with exertion.    HPI Patient is a 72 year old former smoker (16.5 PY) and a history as noted below, who presents for follow-up on the issue of a lung nodule.  She carries a diagnosis of COPD and is maintained on Spiriva and as needed albuterol.  She was last evaluated on 02 June 2021 and at that time her lung function was noted to be well-preserved evidence of small airways component and normal diffusion capacity more consistent with reactive airways disease.  Patient presents today without any complaint.  She has usually cough in the mornings productive of white sputum which clears very quickly after she first expectorates.  No cough during the rest of the day.  She has had mild shortness of breath on exertion of longstanding but notes that this is no worse and perhaps better.  Apnea, no paroxysmal nocturnal dyspnea, no lower extremity edema or calf tenderness.  Overall she feels well and looks well.  She had a CT chest for follow-up of a 4 mm lung nodule December 2023, this is consistent with sequela of prior infection or inflammation and no further follow-up is necessary.  Review of Systems A 10 point review of systems was performed and it is as noted above otherwise negative.  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   Posttraumatic stress disorder 12/23/2018   Severe recurrent major depression without psychotic features ([place]) 11/10/2017   Abdominal pain, chronic, generalized 09/20/2016   Hepatic steatosis 08/14/2016   Multiple thyroid nodules 01/12/2016    Herniation of lumbar intervertebral disc with radiculopathy 05/20/2013   Degenerative disc disease 07/18/2011   Allergic rhinitis 06/21/2011   GERD (gastroesophageal reflux disease) 04/28/2011   Glaucoma suspect with open angle 04/17/2011   Depression 12/26/2010   Vision impairment 12/26/2010   Hypertension 12/26/2010   Hyperlipidemia with target LDL less than 70 12/26/2010   H/O: substance abuse ([place]) 12/26/2010   Diabetes mellitus, type 2 ([place]) 12/26/2010   Cervical stenosis of spine 12/26/2010   Social History   Tobacco Use   Smoking status: Former    Packs/day: 0.50    Years: 33.00    Total pack years: 16.50    Types: Cigarettes    Quit date: 08/11/2019    Years since quitting: 2.7   Smokeless tobacco: Never  Substance Use Topics   Alcohol use: No   Allergies  Allergen Reactions   Ace Inhibitors Cough    Chronic cough for 3 months. Trial off lisinopril starting 05/11/2011 effective in stopping cough.   Fluticasone Other (See Comments)    choking   Current Meds  Medication Sig   acetaminophen (TYLENOL) 650 MG CR tablet Take 1,300 mg by mouth 2 (two) times daily as needed for pain.   amLODipine (NORVASC) 10 MG tablet Take 1 tablet (10 mg total) by mouth daily.   Artificial Saliva (BIOTENE MOISTURIZING MOUTH MT) Use as directed 1-2 sprays in the mouth or throat 4 (four) times daily as needed.   ASPERCREME LIDOCAINE 4 % CREA Apply 1 application topically  2 (two) times daily. Apply to back and knees prn for pain   ASPIRIN 81 PO Take by mouth daily.   atorvastatin (LIPITOR) 80 MG tablet Take 1 tablet (80 mg total) by mouth daily.   calcium carbonate (TUMS - DOSED IN MG ELEMENTAL CALCIUM) 500 MG chewable tablet Chew 2 tablets by mouth 2 (two) times daily as needed for indigestion or heartburn.   camphor-menthol (SARNA) lotion Apply 1 application topically as needed for itching.   chlorhexidine (PERIDEX) 0.12 % solution Use as directed 15 mLs in the mouth or throat 2 (two)  times daily as needed. Swish for 30 seconds then spit   Cholecalciferol 25 MCG (1000 UT) capsule Take 1,000 Units by mouth daily.   clobetasol cream (TEMOVATE) [id number] % Apply 1 application topically 2 (two) times a week. Apply to vulva   clotrimazole (LOTRIMIN) 1 % cream Apply 1 application topically 2 (two) times daily.   cyclobenzaprine (FLEXERIL) 10 MG tablet Take 10 mg by mouth 3 (three) times daily.   ferrous sulfate 325 (65 FE) MG tablet Take 1 tablet (325 mg total) by mouth daily.   folic acid (FOLVITE) 1 MG tablet Take 1 mg by mouth daily.   HYDROcodone-acetaminophen (NORCO/VICODIN) 5-325 MG tablet Take 1 tablet by mouth as needed.   ibuprofen (ADVIL) 800 MG tablet Take 800 mg by mouth 3 (three) times daily.   insulin degludec (TRESIBA FLEXTOUCH) 200 UNIT/ML FlexTouch Pen Inject 60 Units into the skin at bedtime.   isosorbide mononitrate (IMDUR) 30 MG 24 hr tablet Take 0.5 tablets (15 mg total) by mouth daily.   JARDIANCE 25 MG TABS tablet Take 25 mg by mouth daily.   Lancets (ONETOUCH DELICA PLUS 123XX123) MISC Apply topically.   metFORMIN (GLUCOPHAGE) 500 MG tablet Take 1,000 mg by mouth 2 (two) times daily with a meal.   methimazole (TAPAZOLE) 5 MG tablet Take 2.5 mg by mouth daily.   mometasone (NASONEX) 50 MCG/ACT nasal spray Place 2 sprays into the nose daily as needed.   nitroGLYCERIN (NITROSTAT) 0.4 MG SL tablet Place 0.4 mg under the tongue every 5 (five) minutes as needed for chest pain.   omeprazole (PRILOSEC) 40 MG capsule Take 1 capsule by mouth daily.   ONETOUCH ULTRA test strip USE AS DIRECTED FOR BLOOD SUGAR TESTING THREE TIMES DAILY FOR DIABETES E11.9   PARoxetine (PAXIL) 40 MG tablet Take 1 tablet (40 mg total) by mouth every morning.   predniSONE (DELTASONE) 20 MG tablet Take 20 mg by mouth daily.   pregabalin (LYRICA) 100 MG capsule Take 100 mg by mouth 2 (two) times daily.   SPIRIVA HANDIHALER 18 MCG inhalation capsule Place 1 capsule into inhaler and inhale daily.    tiZANidine (ZANAFLEX) 2 MG tablet Take 2 mg by mouth 3 (three) times daily.   traMADol (ULTRAM) 50 MG tablet Take 50 mg by mouth at bedtime.   traZODone (DESYREL) 50 MG tablet Take 50 mg by mouth at bedtime.   Vaginal Lubricant (REPLENS) GEL Place 1 application vaginally 3 (three) times daily as needed. Apply to vagina and vulva as needed for itching. Use on days that you do not use clobetasol   VENTOLIN HFA 108 (90 Base) MCG/ACT inhaler Inhale 2 puffs into the lungs every 6 (six) hours as needed.   Immunization History  Administered Date(s) Administered   Hepatitis B, ADULT 11/19/2014, 12/29/2015   Influenza Inj Mdck Quad Pf 12/26/2017   Influenza, High Dose Seasonal PF 11/19/2014, 12/29/2015   Influenza,inj,Quad PF,6+ Mos 11/10/2013  Influenza-Unspecified 12/26/2010, 12/20/2011, 11/28/2012, 11/19/2014, 12/29/2015, 12/10/2021   Pneumococcal Conjugate-13 06/08/2014   Pneumococcal Polysaccharide-23 12/20/2011   Tdap 04/02/2012   Zoster, Live 10/27/2014       Objective:   Physical Exam BP 138/84 (BP Location: Left Arm, Cuff Size: Normal)   Pulse 81   Temp (!) 97.3 F (36.3 C)   Ht 5' 5"$  (1.651 m)   Wt 150 lb 6.4 oz (68.2 kg)   SpO2 99%   BMI 25.03 kg/m   SpO2: 99 % O2 Device: None (Room air)  GENERAL: Well-developed well-nourished woman, no acute distress, fully ambulatory.  No conversational dyspnea. HEAD: Normocephalic, atraumatic.  EYES: Prosthetic left eye. MOUTH: Nose/mouth/throat not examined due to masking requirements for COVID 19. NECK: Supple. No thyromegaly. Trachea midline. No JVD.  No adenopathy. PULMONARY: Good air entry bilaterally.  No adventitious sounds. CARDIOVASCULAR: S1 and S2. Regular rate and rhythm.  Grade 1/6 systolic ejection murmur left sternal border. ABDOMEN: Benign. MUSCULOSKELETAL: No joint deformity, no clubbing, no edema.  NEUROLOGIC: No focal deficit, no gait disturbance, speech is fluent. SKIN: Intact,warm,dry. PSYCH: Mood and  behavior normal        Assessment & Plan:     ICD-10-CM   1. Nodule of upper lobe of right lung - 41m  R91.1    Likely residual from infectious process Postinflammatory scarring No further imaging necessary    2. Reactive airways dysfunction syndrome (HCC)  J68.3    Does not appear to be COPD per se Continue Spiriva and albuterol Patient seems to be doing well with this regimen    3. SOB (shortness of breath)  R06.02    Notes no issues with this lately    4. Graves disease  E05.00    Being managed by primary physician This issue adds complexity to her management    5. Former light cigarette smoker (1-9 per day)  Z87.891    No evidence of relapse Total smoking history 16.5 pack years Does not meet criteria for lung cancer screening     Overall Ms. DKazemiappears to be doing well.  She appears to be well compensated on her current regimen.  The small nodularity seen on the right upper lobe is likely residual from infectious process.  It has not grown and has become less dense.  There is no further follow-up necessary in this regard.  We will see Ms. DPicciottoin a year's time or as needed.  She is encouraged to continue follow-up with primary care as scheduled.  CRenold Don MD Advanced Bronchoscopy PCCM [facility]    *This note was dictated using voice recognition software/Dragon.  Despite best efforts to proofread, errors can occur which can change the meaning. Any transcriptional errors that result from this process are unintentional and may not be fully corrected at the time of dictation.

## 2022-05-26 ENCOUNTER — Encounter: Payer: Self-pay | Admitting: Pulmonary Disease

## 2022-06-04 ENCOUNTER — Ambulatory Visit: Payer: Medicare Other | Admitting: Podiatry

## 2022-06-15 ENCOUNTER — Ambulatory Visit: Payer: Self-pay | Admitting: Podiatry

## 2022-06-22 ENCOUNTER — Ambulatory Visit (INDEPENDENT_AMBULATORY_CARE_PROVIDER_SITE_OTHER): Payer: Medicare HMO | Admitting: Podiatry

## 2022-06-22 ENCOUNTER — Encounter: Payer: Self-pay | Admitting: Podiatry

## 2022-06-22 DIAGNOSIS — M79675 Pain in left toe(s): Secondary | ICD-10-CM | POA: Diagnosis not present

## 2022-06-22 DIAGNOSIS — R202 Paresthesia of skin: Secondary | ICD-10-CM

## 2022-06-22 DIAGNOSIS — R200 Anesthesia of skin: Secondary | ICD-10-CM

## 2022-06-22 DIAGNOSIS — B351 Tinea unguium: Secondary | ICD-10-CM | POA: Diagnosis not present

## 2022-06-22 DIAGNOSIS — M79674 Pain in right toe(s): Secondary | ICD-10-CM

## 2022-06-22 DIAGNOSIS — E119 Type 2 diabetes mellitus without complications: Secondary | ICD-10-CM

## 2022-06-22 NOTE — Progress Notes (Signed)
This patient returns to my office for at risk foot care.  This patient requires this care by a professional since this patient will be at risk due to having diabetes with neuropathy .  This patient is unable to cut nails herself since the patient cannot reach her nails.These nails are painful walking and wearing shoes. Patient says she has intensely painful feet especially at night. This patient presents for at risk foot care today.  General Appearance  Alert, conversant and in no acute stress.  Vascular  Dorsalis pedis and posterior tibial  pulses are palpable  bilaterally.  Capillary return is within normal limits  bilaterally. Temperature is within normal limits  bilaterally.  Neurologic  Senn-Weinstein monofilament wire test within normal limits  bilaterally. Muscle power within normal limits bilaterally.  Nails Thick disfigured discolored nails with subungual debris  hallux nails  bilaterally. No evidence of bacterial infection or drainage bilaterally.  Orthopedic  No limitations of motion  feet .  No crepitus or effusions noted.  No bony pathology or digital deformities noted.  Skin  normotropic skin with no porokeratosis noted bilaterally.  No signs of infections or ulcers noted.     Onychomycosis  Pain in right toes  Pain in left toes  Peripheral neuropathy  Consent was obtained for treatment procedures.   Mechanical debridement of nails 1-5  bilaterally performed with a nail nipper.  Filed with dremel without incident. Refer to Dr.  Allena Katz for evaluation of feet.  Rule out neuropathy.   Return office   12  weeks              Told patient to return for periodic foot care and evaluation due to potential at risk complications.   Helane Gunther DPM

## 2022-07-04 ENCOUNTER — Ambulatory Visit: Payer: Medicare HMO | Admitting: Podiatry

## 2022-07-05 ENCOUNTER — Ambulatory Visit (INDEPENDENT_AMBULATORY_CARE_PROVIDER_SITE_OTHER): Payer: Medicare HMO | Admitting: Podiatry

## 2022-07-05 DIAGNOSIS — R200 Anesthesia of skin: Secondary | ICD-10-CM

## 2022-07-05 DIAGNOSIS — Z8739 Personal history of other diseases of the musculoskeletal system and connective tissue: Secondary | ICD-10-CM

## 2022-07-05 DIAGNOSIS — E119 Type 2 diabetes mellitus without complications: Secondary | ICD-10-CM

## 2022-07-05 DIAGNOSIS — R202 Paresthesia of skin: Secondary | ICD-10-CM

## 2022-07-05 MED ORDER — PREGABALIN 100 MG PO CAPS: 100.0000 mg | ORAL_CAPSULE | Freq: Two times a day (BID) | ORAL | 0 refills | Status: DC

## 2022-07-05 NOTE — Progress Notes (Signed)
Subjective:  Patient ID: Erica Estrada, female    DOB: 1950-10-04,  MRN: [id number]  Chief Complaint  Patient presents with   Numbness    Pt stated that her feet are numb and causing her discomfort     72 y.o. female presents with the above complaint.  Patient presents with numbness tingling sensation to both of her feet from the midfoot to the toes.  She has not seen the back doctor.  She states that she will make an appointment to see 1.  She denies any other acute complaints.  Review of Systems: Negative except as noted in the HPI. Denies N/V/F/Ch.  Past Medical History:  Diagnosis Date   Arthritis    Asthma    Coronary artery calcification    Noted on prior CTA chest (01/2021)   Diabetes mellitus without complication    Former tobacco use    Graves' disease    Hepatitis 1995   Hep C.  Treated.   Hyperlipidemia    Hypertension    Precordial chest pain    MV 02/2021 low risk, no significant ischemia    Current Outpatient Medications:    pregabalin (LYRICA) 100 MG capsule, Take 1 capsule (100 mg total) by mouth 2 (two) times daily., Disp: 60 capsule, Rfl: 0   acetaminophen (TYLENOL) 650 MG CR tablet, Take 1,300 mg by mouth 2 (two) times daily as needed for pain., Disp: , Rfl:    amLODipine (NORVASC) 10 MG tablet, Take 1 tablet (10 mg total) by mouth daily., Disp: 30 tablet, Rfl: 0   Artificial Saliva (BIOTENE MOISTURIZING MOUTH MT), Use as directed 1-2 sprays in the mouth or throat 4 (four) times daily as needed., Disp: , Rfl:    ASPERCREME LIDOCAINE 4 % CREA, Apply 1 application topically 2 (two) times daily. Apply to back and knees prn for pain, Disp: , Rfl:    ASPIRIN 81 PO, Take by mouth daily., Disp: , Rfl:    atorvastatin (LIPITOR) 80 MG tablet, Take 1 tablet (80 mg total) by mouth daily., Disp: 30 tablet, Rfl: 0   calcium carbonate (TUMS - DOSED IN MG ELEMENTAL CALCIUM) 500 MG chewable tablet, Chew 2 tablets by mouth 2 (two) times daily as needed for indigestion or  heartburn., Disp: , Rfl:    camphor-menthol (SARNA) lotion, Apply 1 application topically as needed for itching., Disp: , Rfl:    chlorhexidine (PERIDEX) 0.12 % solution, Use as directed 15 mLs in the mouth or throat 2 (two) times daily as needed. Swish for 30 seconds then spit, Disp: , Rfl:    Cholecalciferol 25 MCG (1000 UT) capsule, Take 1,000 Units by mouth daily., Disp: , Rfl:    clobetasol cream (TEMOVATE) 0.05 %, Apply 1 application topically 2 (two) times a week. Apply to vulva, Disp: , Rfl:    clotrimazole (LOTRIMIN) 1 % cream, Apply 1 application topically 2 (two) times daily., Disp: , Rfl:    cyclobenzaprine (FLEXERIL) 10 MG tablet, Take 10 mg by mouth 3 (three) times daily., Disp: , Rfl:    ferrous sulfate 325 (65 FE) MG tablet, Take 1 tablet (325 mg total) by mouth daily., Disp: 90 tablet, Rfl: 0   folic acid (FOLVITE) 1 MG tablet, Take 1 mg by mouth daily., Disp: , Rfl:    HYDROcodone-acetaminophen (NORCO/VICODIN) 5-325 MG tablet, Take 1 tablet by mouth as needed., Disp: , Rfl:    ibuprofen (ADVIL) 800 MG tablet, Take 800 mg by mouth 3 (three) times daily., Disp: , Rfl:  insulin degludec (TRESIBA FLEXTOUCH) 200 UNIT/ML FlexTouch Pen, Inject 60 Units into the skin at bedtime., Disp: , Rfl:    isosorbide mononitrate (IMDUR) 30 MG 24 hr tablet, Take 0.5 tablets (15 mg total) by mouth daily., Disp: 45 tablet, Rfl: 3   JARDIANCE 25 MG TABS tablet, Take 25 mg by mouth daily., Disp: , Rfl:    Lancets (ONETOUCH DELICA PLUS LANCET33G) MISC, Apply topically., Disp: , Rfl:    metFORMIN (GLUCOPHAGE) 500 MG tablet, Take 1,000 mg by mouth 2 (two) times daily with a meal., Disp: , Rfl:    methimazole (TAPAZOLE) 5 MG tablet, Take 2.5 mg by mouth daily., Disp: , Rfl:    mometasone (NASONEX) 50 MCG/ACT nasal spray, Place 2 sprays into the nose daily as needed., Disp: , Rfl:    nitroGLYCERIN (NITROSTAT) 0.4 MG SL tablet, Place 0.4 mg under the tongue every 5 (five) minutes as needed for chest pain.,  Disp: , Rfl:    omeprazole (PRILOSEC) 40 MG capsule, Take 1 capsule by mouth daily., Disp: , Rfl:    ONETOUCH ULTRA test strip, USE AS DIRECTED FOR BLOOD SUGAR TESTING THREE TIMES DAILY FOR DIABETES E11.9, Disp: , Rfl:    PARoxetine (PAXIL) 40 MG tablet, Take 1 tablet (40 mg total) by mouth every morning., Disp: 30 tablet, Rfl: 0   predniSONE (DELTASONE) 20 MG tablet, Take 20 mg by mouth daily., Disp: , Rfl:    pregabalin (LYRICA) 100 MG capsule, Take 100 mg by mouth 2 (two) times daily., Disp: , Rfl:    SPIRIVA HANDIHALER 18 MCG inhalation capsule, Place 1 capsule into inhaler and inhale daily., Disp: , Rfl:    tiZANidine (ZANAFLEX) 2 MG tablet, Take 2 mg by mouth 3 (three) times daily., Disp: , Rfl:    traMADol (ULTRAM) 50 MG tablet, Take 50 mg by mouth at bedtime., Disp: , Rfl:    traZODone (DESYREL) 50 MG tablet, Take 50 mg by mouth at bedtime., Disp: , Rfl:    Vaginal Lubricant (REPLENS) GEL, Place 1 application vaginally 3 (three) times daily as needed. Apply to vagina and vulva as needed for itching. Use on days that you do not use clobetasol, Disp: , Rfl:    VENTOLIN HFA 108 (90 Base) MCG/ACT inhaler, Inhale 2 puffs into the lungs every 6 (six) hours as needed., Disp: , Rfl:   Social History   Tobacco Use  Smoking Status Former   Packs/day: 0.50   Years: 33.00   Additional pack years: 0.00   Total pack years: 16.50   Types: Cigarettes   Quit date: 08/11/2019   Years since quitting: 2.9  Smokeless Tobacco Never    Allergies  Allergen Reactions   Ace Inhibitors Cough    Chronic cough for 3 months. Trial off lisinopril starting 05/11/2011 effective in stopping cough.   Fluticasone Other (See Comments)    choking   Objective:  There were no vitals filed for this visit. There is no height or weight on file to calculate BMI. Constitutional Well developed. Well nourished.  Vascular Dorsalis pedis pulses palpable bilaterally. Posterior tibial pulses palpable  bilaterally. Capillary refill normal to all digits.  No cyanosis or clubbing noted. Pedal hair growth normal.  Neurologic Normal speech. Oriented to person, place, and time. Decreased but intact sensation to light touch grossly present bilaterally.  Negative Tinel's sign.  Negative common peroneal compression syndrome.  Dermatologic Numbness tingling noted to all of the toes subjectively.  Protective sensations intact.  No open wounds or lesion noted  Orthopedic: Normal joint ROM without pain or crepitus bilaterally. No visible deformities. No bony tenderness.   Radiographs: None Assessment:   1. Numbness and tingling   2. Type 2 diabetes mellitus without complication, without long-term current use of insulin   3. History of back pain     Plan:  Patient was evaluated and treated and all questions answered.  Bilateral numbness tingling to the toes with a history of lower back pain -All questions and concerns were discussed with the patient in extensive detail.  I discussed with the patient that this could be diabetic related or lower back pain/arthritic pain.  I discussed with him that she could be developing stenosis leading to numbness tingling to all of her toes equally.  Positional changes can help.  I discussed with her to be see a spine specialist to be further evaluated she states understanding. -Continue taking Lyrica for the pain. -She may be a candidate for trial and neurostimulator  No follow-ups on file.

## 2022-08-02 ENCOUNTER — Other Ambulatory Visit: Payer: Self-pay | Admitting: Otolaryngology

## 2022-08-02 DIAGNOSIS — E0500 Thyrotoxicosis with diffuse goiter without thyrotoxic crisis or storm: Secondary | ICD-10-CM

## 2022-08-09 ENCOUNTER — Other Ambulatory Visit: Payer: Self-pay | Admitting: Otolaryngology

## 2022-08-09 DIAGNOSIS — R1310 Dysphagia, unspecified: Secondary | ICD-10-CM

## 2022-08-09 DIAGNOSIS — R09A2 Foreign body sensation, throat: Secondary | ICD-10-CM

## 2022-08-17 ENCOUNTER — Ambulatory Visit
Admission: RE | Admit: 2022-08-17 | Discharge: 2022-08-17 | Disposition: A | Discharge status: Home / Self Care | Payer: Medicare HMO | Source: Ambulatory Visit | Attending: Otolaryngology | Admitting: Otolaryngology

## 2022-08-17 DIAGNOSIS — E0500 Thyrotoxicosis with diffuse goiter without thyrotoxic crisis or storm: Secondary | ICD-10-CM

## 2022-08-22 ENCOUNTER — Telehealth: Payer: Self-pay | Admitting: Cardiology

## 2022-08-22 NOTE — Telephone Encounter (Signed)
Per Darreld Mclean, patient needs an appointment - notes indexed Overdue for follow up Called to schedule, VM full

## 2022-09-06 ENCOUNTER — Ambulatory Visit: Payer: Medicare HMO

## 2022-09-21 ENCOUNTER — Ambulatory Visit: Payer: Medicare HMO | Admitting: Podiatry

## 2022-09-21 ENCOUNTER — Ambulatory Visit (INDEPENDENT_AMBULATORY_CARE_PROVIDER_SITE_OTHER): Payer: Medicare HMO | Admitting: Podiatry

## 2022-09-21 DIAGNOSIS — Z91199 Patient's noncompliance with other medical treatment and regimen due to unspecified reason: Secondary | ICD-10-CM

## 2022-09-21 NOTE — Progress Notes (Signed)
1. No-show for appointment     

## 2022-09-28 ENCOUNTER — Encounter: Payer: Self-pay | Admitting: Cardiology

## 2022-09-28 ENCOUNTER — Ambulatory Visit: Payer: Medicare HMO | Attending: Cardiology | Admitting: Cardiology

## 2022-09-28 VITALS — BP 130/68 | HR 70 | Ht 65.0 in | Wt 162.4 lb

## 2022-09-28 DIAGNOSIS — E118 Type 2 diabetes mellitus with unspecified complications: Secondary | ICD-10-CM

## 2022-09-28 DIAGNOSIS — R079 Chest pain, unspecified: Secondary | ICD-10-CM | POA: Diagnosis not present

## 2022-09-28 DIAGNOSIS — I2510 Atherosclerotic heart disease of native coronary artery without angina pectoris: Secondary | ICD-10-CM | POA: Diagnosis not present

## 2022-09-28 DIAGNOSIS — I10 Essential (primary) hypertension: Secondary | ICD-10-CM | POA: Diagnosis not present

## 2022-09-28 DIAGNOSIS — E785 Hyperlipidemia, unspecified: Secondary | ICD-10-CM

## 2022-09-28 DIAGNOSIS — R0602 Shortness of breath: Secondary | ICD-10-CM

## 2022-09-28 DIAGNOSIS — Z794 Long term (current) use of insulin: Secondary | ICD-10-CM

## 2022-09-28 NOTE — Patient Instructions (Signed)
Medication Instructions:  The current medical regimen is effective;  continue present plan and medications.  *If you need a refill on your cardiac medications before your next appointment, please call your pharmacy*   Testing/Procedures: Your physician has requested that you have an echocardiogram. Echocardiography is a painless test that uses sound waves to create images of your heart. It provides your doctor with information about the size and shape of your heart and how well your heart's chambers and valves are working.   You may receive an ultrasound enhancing agent through an IV if needed to better visualize your heart during the echo. This procedure takes approximately one hour.  There are no restrictions for this procedure.  This will take place at [address] ([facility] [address], [place] [address]  Your provider has ordered a Lexiscan/ Exercise Myoview Stress test. This will take place at [facility]. Please report to the [facility] medical mall entrance. The volunteers at the first desk will direct you where to go.  [facility] MYOVIEW  Your provider has ordered a Stress Test with nuclear imaging. The purpose of this test is to evaluate the blood supply to your heart muscle. This procedure is referred to as a "Non-Invasive Stress Test." This is because other than having an IV started in your vein, nothing is inserted or "invades" your body. Cardiac stress tests are done to find areas of poor blood flow to the heart by determining the extent of coronary artery disease (CAD). Some patients exercise on a treadmill, which naturally increases the blood flow to your heart, while others who are unable to walk on a treadmill due to physical limitations will have a pharmacologic/chemical stress agent called Lexiscan . This medicine will mimic walking on a treadmill by temporarily increasing your coronary blood flow.   Please note: these test may take anywhere between 2-4 hours to complete  How to  prepare for your Myoview test:  Nothing to eat for 6 hours prior to the test No caffeine for 24 hours prior to test No smoking 24 hours prior to test. Your medication may be taken with water.  If your doctor stopped a medication because of this test, do not take that medication. Ladies, please do not wear dresses.  Skirts or pants are appropriate. Please wear a short sleeve shirt. No perfume, cologne or lotion. Wear comfortable walking shoes. No heels!   PLEASE NOTIFY THE OFFICE AT LEAST 24 HOURS IN ADVANCE IF YOU ARE UNABLE TO KEEP YOUR APPOINTMENT.  [phone] AND  PLEASE NOTIFY NUCLEAR MEDICINE AT [facility] AT LEAST 24 HOURS IN ADVANCE IF YOU ARE UNABLE TO KEEP YOUR APPOINTMENT. [phone]    Follow-Up: At [facility], you and your health needs are our priority.  As part of our continuing mission to provide you with exceptional heart care, we have created designated Provider Care Teams.  These Care Teams include your primary Cardiologist (physician) and Advanced Practice Providers (APPs -  Physician Assistants and Nurse Practitioners) who all work together to provide you with the care you need, when you need it.  We recommend signing up for the patient portal called "MyChart".  Sign up information is provided on this After Visit Summary.  MyChart is used to connect with patients for Virtual Visits (Telemedicine).  Patients are able to view lab/test results, encounter notes, upcoming appointments, etc.  Non-urgent messages can be sent to your provider as well.   To learn more about what you can do with MyChart, go to  [email or web address].    Your next appointment:   1-2 month(s) after testing   Provider:   You may see Debbe Odea, MD or one of the following Advanced Practice Providers on your designated Care Team:   Nicolasa Ducking, NP Eula Listen, PA-C Cadence Fransico Michael, PA-C Charlsie Quest, NP

## 2022-09-28 NOTE — Addendum Note (Signed)
Addended by: Darene Lamer T on: 09/28/2022 01:18 PM   Modules accepted: Orders

## 2022-09-28 NOTE — Progress Notes (Signed)
Cardiology Office Note:  .   Date:  09/28/2022  ID:  Erica Estrada, DOB 07-Aug-1950, MRN [id number] PCP: [name], MD  [facility] Providers Cardiologist:  Debbe Odea, MD    History of Present Illness: .   Erica Estrada is a 72 y.o. female with a past medical history of CAD (coronary artey calcification on chest CT), HTN, HLD, DM, former smoker x 40+ years, who present today for follow up.  Previous echocardiogram was normal LVEF 60-65%, Lexiscan Myoview did not show any ischemia. CT angio of the chest 11/22 showed three vessel coronary artery calcifications.   She was last seen in clinic 08/24/21 by Dr Azucena Cecil. At that time she had been having chest pain 2-3 days where she had taken nitroglycerin. Previous work-up had been unrevealing. She was continued on ASA, atorvastatin, and Imdur.   She returns to clinic today accompanied by her niece. She stated that she has been suffering from increased shortness of breath, chest pain with rest of exertion, and pain to her bilateral lower extremities. She stated that she has not used her Nitrostat as it makes her feel bad after she takes it. She has continued to take her Imdur. Has been compliant with her current medication regimen. Had one visit to the emergency department in January due to a fall but denies any recurrent emergency department visits or hospitalizations.    ROS: 10 point review has been completed and considered negative with the exception of what has been listed in the HPI.  Studies Reviewed: Marland Kitchen      EKG reveals sinus with sinus arrhythmia rate of 70, no acute change noted from previous studies.  12/2/2022[name] MPI Pharmacological myocardial perfusion imaging study with no significant  ischemia Normal wall motion, EF estimated at 68% No EKG changes concerning for ischemia at peak stress or in recovery. CT attenuation correction images with mild aortic atherosclerosis, mild coronary calcification Low  risk scan  01/22/21-TTE 1. Left ventricular ejection fraction, by estimation, is 60 to 65%. The  left ventricle has normal function. The left ventricle has no regional  wall motion abnormalities. Left ventricular diastolic parameters were  normal.   2. Right ventricular systolic function is normal. The right ventricular  size is normal.   3. The mitral valve is normal in structure. Trivial mitral valve  regurgitation.   4. The aortic valve is normal in structure. Aortic valve regurgitation is  not visualized.   Risk Assessment/Calculations:             Physical Exam:   VS:  BP 130/68 (BP Location: Left Arm, Patient Position: Sitting, Cuff Size: Normal)   Pulse 70   Ht 5\' 5"  (1.651 m)   Wt 162 lb 6.4 oz (73.7 kg)   SpO2 96%   BMI 27.02 kg/m    Wt Readings from Last 3 Encounters:  09/28/22 162 lb 6.4 oz (73.7 kg)  05/03/22 150 lb 6.4 oz (68.2 kg)  04/09/22 148 lb (67.1 kg)    GEN: Well nourished, well developed in no acute distress NECK: No JVD; No carotid bruits CARDIAC: RRR, no murmurs, rubs, gallops RESPIRATORY:  Clear to auscultation without rales, wheezing or rhonchi  ABDOMEN: Soft, non-tender, non-distended EXTREMITIES:  No edema; No deformity   ASSESSMENT AND PLAN: .   Coronary artery calcification with complaints of continued chest pain. Prior studies completed in 2022 were unrevealing. EKG today without ischemic changes noted. Scheduled for [employer]. Discussed the indications for taking Nitrostat and the  side effects and why they are caused. Multiple questions answered. Continued on atorvastatin, imdur, stated that her PCP had taken her off of ASA. Requested office notes.   Shortness of breath, weight gain, and decreased appetite. Continued on her inhalers. Scheduled for an echocardiogram to evaluate LV function.  Hypertension with blood pressure today 130/68. Remains stable. Continued on amlodipine and imdur. Encouraged to monitor blood pressures at home as  well 1-2 hours after taking medications.  Hyperlipidemia with LDL to be drawn by PCP next month. Continued on atorvastatin.   Type II diabetes. Continued on Jardiance. Managed by PCP.    Informed Consent   Shared Decision Making/Informed Consent The risks [chest pain, shortness of breath, cardiac arrhythmias, dizziness, blood pressure fluctuations, myocardial infarction, stroke/transient ischemic attack, nausea, vomiting, allergic reaction, radiation exposure, metallic taste sensation and life-threatening complications (estimated to be 1 in 10,000)], benefits (risk stratification, diagnosing coronary artery disease, treatment guidance) and alternatives of a nuclear stress test were discussed in detail with Erica Estrada and she agrees to proceed.     Dispo: Patient to return to clinic to see MD/APP once testing is complete or sooner if needed.   Signed, Erica Beever, NP

## 2022-10-04 ENCOUNTER — Other Ambulatory Visit: Payer: Medicare HMO

## 2022-10-05 ENCOUNTER — Encounter: Payer: Self-pay | Admitting: Podiatry

## 2022-10-05 ENCOUNTER — Ambulatory Visit: Payer: Medicare HMO | Admitting: Podiatry

## 2022-10-05 VITALS — BP 162/80 | HR 71

## 2022-10-05 DIAGNOSIS — B351 Tinea unguium: Secondary | ICD-10-CM | POA: Diagnosis not present

## 2022-10-05 DIAGNOSIS — M79674 Pain in right toe(s): Secondary | ICD-10-CM

## 2022-10-05 DIAGNOSIS — M79675 Pain in left toe(s): Secondary | ICD-10-CM | POA: Diagnosis not present

## 2022-10-05 NOTE — Progress Notes (Signed)
   Chief Complaint  Patient presents with   Diabetes    "I'm having a lot of pain in my feet and it goes up into my ankles.  It hurts really bad at night and it wakes me up.  I'm feeling it up in my upper leg now.  I want my toenails cut."    SUBJECTIVE Patient with a history of diabetes mellitus presents to office today complaining of elongated, thickened nails that cause pain while ambulating in shoes.  Patient is unable to trim their own nails. Patient is here for further evaluation and treatment.  Past Medical History:  Diagnosis Date   Arthritis    Asthma    Coronary artery calcification    Noted on prior CTA chest (01/2021)   Diabetes mellitus without complication (HCC)    Former tobacco use    Graves' disease    Hepatitis 1995   Hep C.  Treated.   Hyperlipidemia    Hypertension    Precordial chest pain    MV 02/2021 low risk, no significant ischemia    Allergies  Allergen Reactions   Ace Inhibitors Cough    Chronic cough for 3 months. Trial off lisinopril starting 05/11/2011 effective in stopping cough.   Fluticasone Other (See Comments)    choking     OBJECTIVE General Patient is awake, alert, and oriented x 3 and in no acute distress. Derm Skin is dry and supple bilateral. Negative open lesions or macerations. Remaining integument unremarkable. Nails are tender, long, thickened and dystrophic with subungual debris, consistent with onychomycosis, 1-5 bilateral. No signs of infection noted. Vasc  DP and PT pedal pulses palpable bilaterally. Temperature gradient within normal limits.  Neuro light touch and protective threshold sensation diminished bilaterally.  Musculoskeletal Exam No symptomatic pedal deformities noted bilateral. Muscular strength within normal limits.  ASSESSMENT 1. Diabetes Mellitus w/ peripheral neuropathy 2.  Pain due to onychomycosis of toenails bilateral  PLAN OF CARE 1. Patient evaluated today. 2. Instructed to maintain good pedal hygiene  and foot care. Stressed importance of controlling blood sugar.  3. Mechanical debridement of nails 1-5 bilaterally performed using a nail nipper. Filed with dremel without incident.  4.  Continue gabapentin as prescribed by PCP  5.  Return to clinic in 3 mos. for routine diabetic footcare    Felecia Shelling, DPM [facility]  Dr. Felecia Shelling, DPM    [address] [place], [place] [address]                Office [phone]  Fax [phone]

## 2022-10-09 ENCOUNTER — Ambulatory Visit
Admission: RE | Admit: 2022-10-09 | Discharge: 2022-10-09 | Disposition: A | Discharge status: Home / Self Care | Payer: Medicare HMO | Source: Ambulatory Visit | Attending: Cardiology | Admitting: Cardiology

## 2022-10-09 DIAGNOSIS — R079 Chest pain, unspecified: Secondary | ICD-10-CM | POA: Diagnosis present

## 2022-10-09 LAB — NM MYOCAR MULTI W/SPECT W/WALL MOTION / EF
Estimated workload: 1
Exercise duration (min): 0 min
Exercise duration (sec): 0 s
LV dias vol: 65 mL (ref 46–106)
LV sys vol: 20 mL
MPHR: 147 {beats}/min
Nuc Stress EF: 69 %
Peak HR: 103 {beats}/min
Percent HR: 70 %
Rest HR: 82 {beats}/min
Rest Nuclear Isotope Dose: 10.4 mCi
SDS: 1
SRS: 0
SSS: 0
ST Depression (mm): 0 mm
Stress Nuclear Isotope Dose: 30.4 mCi
TID: 0.76

## 2022-10-09 MED ORDER — REGADENOSON 0.4 MG/5ML IV SOLN
0.4000 mg | Freq: Once | INTRAVENOUS | Status: AC
  Administered 2022-10-09: 0.4 mg via INTRAVENOUS
  Filled 2022-10-09: qty 5

## 2022-10-09 MED ORDER — TECHNETIUM TC 99M TETROFOSMIN IV KIT
10.0000 | PACK | Freq: Once | INTRAVENOUS | Status: AC | PRN
  Administered 2022-10-09: 10.38 via INTRAVENOUS

## 2022-10-09 MED ORDER — TECHNETIUM TC 99M TETROFOSMIN IV KIT
30.3700 | PACK | Freq: Once | INTRAVENOUS | Status: AC | PRN
  Administered 2022-10-09: 30.37 via INTRAVENOUS

## 2022-10-11 NOTE — Progress Notes (Signed)
Testing was considered intermediate. There were concerns of ischemia. With current symptoms and test finding. Please move follow up appointment up to discuss further evaluation with left heart catheterization. Please make sure that she is scheduled for her echocardiogram.

## 2022-10-11 NOTE — Progress Notes (Signed)
Yes that is fine.  Thank you.

## 2022-10-18 ENCOUNTER — Ambulatory Visit: Payer: Medicare HMO | Admitting: Cardiology

## 2022-10-30 ENCOUNTER — Ambulatory Visit: Payer: Medicare HMO | Attending: Cardiology

## 2022-11-20 ENCOUNTER — Ambulatory Visit: Payer: Medicare HMO | Admitting: Cardiology

## 2022-11-20 ENCOUNTER — Ambulatory Visit: Payer: Medicare HMO | Attending: Cardiology | Admitting: Cardiology

## 2022-11-20 ENCOUNTER — Encounter: Payer: Self-pay | Admitting: Cardiology

## 2022-11-20 VITALS — BP 132/75 | HR 78 | Ht 65.0 in | Wt 162.6 lb

## 2022-11-20 DIAGNOSIS — E118 Type 2 diabetes mellitus with unspecified complications: Secondary | ICD-10-CM

## 2022-11-20 DIAGNOSIS — R0602 Shortness of breath: Secondary | ICD-10-CM

## 2022-11-20 DIAGNOSIS — I2510 Atherosclerotic heart disease of native coronary artery without angina pectoris: Secondary | ICD-10-CM

## 2022-11-20 DIAGNOSIS — I10 Essential (primary) hypertension: Secondary | ICD-10-CM

## 2022-11-20 DIAGNOSIS — R079 Chest pain, unspecified: Secondary | ICD-10-CM

## 2022-11-20 DIAGNOSIS — E785 Hyperlipidemia, unspecified: Secondary | ICD-10-CM

## 2022-11-20 DIAGNOSIS — Z794 Long term (current) use of insulin: Secondary | ICD-10-CM

## 2022-11-20 NOTE — Progress Notes (Signed)
Cardiology Office Note:  .   Date:  11/20/2022  ID:  Erica Estrada, DOB 01/27/1951, MRN [id number] PCP: Erica Sato, MD  [facility] Providers Cardiologist:  Erica Odea, MD    History of Present Illness: .   Erica Estrada is a 72 y.o. female with a past medical history of coronary artery calcification on chest CT, hypertension, hyperlipidemia, type 2 diabetes, former smoker x 20+ years who presents today after recent stress testing.  Previous echocardiogram revealed LVEF of 60 to 65%.  Lexiscan Myoview did not show any ischemia.  CT angio of the chest 11/22 showed three-vessel coronary artery calcifications.  She was last seen in clinic 09/28/2022.  At that time she been suffering from increased shortness of breath, chest pain with rest and exertion, and bilateral pain to her lower extremities.  She had been evaluated in the emergency department in January due to a fall but denied any recent emergency department visits at that time.  She was scheduled for a [employer] as well as an echocardiogram.  She returns to clinic today accompanied by her husband. She continues to have complaints of chest tightness, shortness of breath, and fatigue.  She continues to state chest pain that is substernal that is associated with rest or exertion.  She does not like to use her Nitrostat as she states it makes her feel bad after she takes it.  She has continued to take on Imdur.  Continues to have pain she states all over today.  Shortness of breath is still present that is unchanged.  She feels as though her fatigue may be getting slightly worse.  She has been compliant with her medications.  Denies any recent hospitalizations or visits to the emergency department.  ROS: 10 point review of systems has been reviewed and considered negative with exception of what is been listed in the HPI  Studies Reviewed: Marland Kitchen       Lexiscan MPI 10/09/22   Findings are consistent with ischemia. The  study is intermediate risk.   No ST deviation was noted.   LV perfusion is abnormal. There is evidence of ischemia. There is evidence of infarction. Defect 1: There is a medium defect with moderate reduction in uptake present in the mid to basal anterior location(s) that is partially reversible. There is normal wall motion in the defect area. Consistent with ischemia.   Left ventricular function is normal. End diastolic cavity size is normal. End systolic cavity size is normal.   Unfortunately, this is a very suboptimal study due to intense GI uptake at the border of the inferior wall.  The anterior wall reversible defect might be due to artifact.   CT attenuation images show evidence of aortic and coronary calcifications.  12/2/2022[name] MPI Pharmacological myocardial perfusion imaging study with no significant  ischemia Normal wall motion, EF estimated at 68% No EKG changes concerning for ischemia at peak stress or in recovery. CT attenuation correction images with mild aortic atherosclerosis, mild coronary calcification Low risk scan   01/22/21-TTE 1. Left ventricular ejection fraction, by estimation, is 60 to 65%. The  left ventricle has normal function. The left ventricle has no regional  wall motion abnormalities. Left ventricular diastolic parameters were  normal.   2. Right ventricular systolic function is normal. The right ventricular  size is normal.   3. The mitral valve is normal in structure. Trivial mitral valve  regurgitation.   4. The aortic valve is normal in structure. Aortic valve regurgitation  is  not visualized.  Risk Assessment/Calculations:             Physical Exam:   VS:  BP 132/75 (BP Location: Right Arm, Patient Position: Sitting, Cuff Size: Normal)   Pulse 78   Ht 5\' 5"  (1.651 m)   Wt 162 lb 9.6 oz (73.8 kg)   SpO2 97%   BMI 27.06 kg/m    Wt Readings from Last 3 Encounters:  11/20/22 162 lb 9.6 oz (73.8 kg)  09/28/22 162 lb 6.4 oz (73.7 kg)   05/03/22 150 lb 6.4 oz (68.2 kg)    GEN: Well nourished, well developed in no acute distress NECK: No JVD; No carotid bruits CARDIAC: RRR, no murmurs, rubs, gallops RESPIRATORY:  Clear to auscultation without rales, wheezing or rhonchi  ABDOMEN: Soft, non-tender, non-distended EXTREMITIES:  No edema; No deformity   ASSESSMENT AND PLAN: .   Precordial pain with a history of coronary artery calcification.  Lexiscan Myoview revealed findings consistent with ischemia and abnormal LV perfusion with evidence of ischemia.  There was also evidence of infarction.  Defect 1 that was medium defect with moderate reduction in uptake present in the mid to basal anterior locations that was partially reversible.  There is normal wall motion in the defect area  consistent with ischemia.  With her continued symptoms and abnormal testing she has been scheduled for a right and left heart catheterization with labs being completed today.  He is continued on aspirin 81 mg daily, atorvastatin 80 mg daily, and Imdur 15 mg daily.  Continued shortness of breath that is unchanged.  Will complete right and left heart catheterization.  Echocardiogram is still ordered and pending.  She is encouraged to continue to use her inhalers.  Primary hypertension with blood pressure today 132/75.  Blood pressure remained stable.  She is continued on amlodipine and Imdur.  She is encouraged to continue to monitor blood pressures at home 1 to 2 hours after taking her medications.  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  She is continued on atorvastatin 80 mg daily.  Will add Zetia 10 mg daily on return.  Type 2 diabetes which she is continued on Jardiance which will have to be held prior to her procedure, Evaristo Bury, and metformin.  Diabetes continues to be managed by her PCP.    Informed Consent   Shared Decision Making/Informed Consent The risks [stroke (1 in 1000), death (1 in 1000), kidney failure [usually temporary] (1 in 500),  bleeding (1 in 200), allergic reaction [possibly serious] (1 in 200)], benefits (diagnostic support and management of coronary artery disease) and alternatives of a cardiac catheterization were discussed in detail with Erica Estrada and she is willing to proceed.     Dispo: Patient to return to clinic to see MD/APP in 1 to 2 weeks postprocedure  Signed, Densel Kronick, NP

## 2022-11-20 NOTE — H&P (View-Only) (Signed)
Cardiology Office Note:  .   Date:  11/20/2022  ID:  [name], DOB 1950/10/10, MRN [id number] PCP: [name], MD  [facility] Providers Cardiologist:  Debbe Odea, MD    History of Present Illness: .   Erica Estrada is a 72 y.o. female with a past medical history of coronary artery calcification on chest CT, hypertension, hyperlipidemia, type 2 diabetes, former smoker x 20+ years who presents today after recent stress testing.  Previous echocardiogram revealed LVEF of 60 to 65%.  Lexiscan Myoview did not show any ischemia.  CT angio of the chest 11/22 showed three-vessel coronary artery calcifications.  She was last seen in clinic 09/28/2022.  At that time she been suffering from increased shortness of breath, chest pain with rest and exertion, and bilateral pain to her lower extremities.  She had been evaluated in the emergency department in January due to a fall but denied any recent emergency department visits at that time.  She was scheduled for a [employer] as well as an echocardiogram.  She returns to clinic today accompanied by her husband. She continues to have complaints of chest tightness, shortness of breath, and fatigue.  She continues to state chest pain that is substernal that is associated with rest or exertion.  She does not like to use her Nitrostat as she states it makes her feel bad after she takes it.  She has continued to take on Imdur.  Continues to have pain she states all over today.  Shortness of breath is still present that is unchanged.  She feels as though her fatigue may be getting slightly worse.  She has been compliant with her medications.  Denies any recent hospitalizations or visits to the emergency department.  ROS: 10 point review of systems has been reviewed and considered negative with exception of what is been listed in the HPI  Studies Reviewed: Marland Kitchen       Lexiscan MPI 10/09/22   Findings are consistent with ischemia. The  study is intermediate risk.   No ST deviation was noted.   LV perfusion is abnormal. There is evidence of ischemia. There is evidence of infarction. Defect 1: There is a medium defect with moderate reduction in uptake present in the mid to basal anterior location(s) that is partially reversible. There is normal wall motion in the defect area. Consistent with ischemia.   Left ventricular function is normal. End diastolic cavity size is normal. End systolic cavity size is normal.   Unfortunately, this is a very suboptimal study due to intense GI uptake at the border of the inferior wall.  The anterior wall reversible defect might be due to artifact.   CT attenuation images show evidence of aortic and coronary calcifications.  12/2/2022Eugenie Birks MPI Pharmacological myocardial perfusion imaging study with no significant  ischemia Normal wall motion, EF estimated at 68% No EKG changes concerning for ischemia at peak stress or in recovery. CT attenuation correction images with mild aortic atherosclerosis, mild coronary calcification Low risk scan   01/22/21-TTE 1. Left ventricular ejection fraction, by estimation, is 60 to 65%. The  left ventricle has normal function. The left ventricle has no regional  wall motion abnormalities. Left ventricular diastolic parameters were  normal.   2. Right ventricular systolic function is normal. The right ventricular  size is normal.   3. The mitral valve is normal in structure. Trivial mitral valve  regurgitation.   4. The aortic valve is normal in structure. Aortic valve regurgitation  is  not visualized.  Risk Assessment/Calculations:             Physical Exam:   VS:  BP 132/75 (BP Location: Right Arm, Patient Position: Sitting, Cuff Size: Normal)   Pulse 78   Ht 5\' 5"  (1.651 m)   Wt 162 lb 9.6 oz (73.8 kg)   SpO2 97%   BMI 27.06 kg/m    Wt Readings from Last 3 Encounters:  11/20/22 162 lb 9.6 oz (73.8 kg)  09/28/22 162 lb 6.4 oz (73.7 kg)   05/03/22 150 lb 6.4 oz (68.2 kg)    GEN: Well nourished, well developed in no acute distress NECK: No JVD; No carotid bruits CARDIAC: RRR, no murmurs, rubs, gallops RESPIRATORY:  Clear to auscultation without rales, wheezing or rhonchi  ABDOMEN: Soft, non-tender, non-distended EXTREMITIES:  No edema; No deformity   ASSESSMENT AND PLAN: .   Precordial pain with a history of coronary artery calcification.  Lexiscan Myoview revealed findings consistent with ischemia and abnormal LV perfusion with evidence of ischemia.  There was also evidence of infarction.  Defect 1 that was medium defect with moderate reduction in uptake present in the mid to basal anterior locations that was partially reversible.  There is normal wall motion in the defect area  consistent with ischemia.  With her continued symptoms and abnormal testing she has been scheduled for a right and left heart catheterization with labs being completed today.  He is continued on aspirin 81 mg daily, atorvastatin 80 mg daily, and Imdur 15 mg daily.  Continued shortness of breath that is unchanged.  Will complete right and left heart catheterization.  Echocardiogram is still ordered and pending.  She is encouraged to continue to use her inhalers.  Primary hypertension with blood pressure today 132/75.  Blood pressure remained stable.  She is continued on amlodipine and Imdur.  She is encouraged to continue to monitor blood pressures at home 1 to 2 hours after taking her medications.  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  She is continued on atorvastatin 80 mg daily.  Will add Zetia 10 mg daily on return.  Type 2 diabetes which she is continued on Jardiance which will have to be held prior to her procedure, Evaristo Bury, and metformin.  Diabetes continues to be managed by her PCP.    Informed Consent   Shared Decision Making/Informed Consent The risks [stroke (1 in 1000), death (1 in 1000), kidney failure [usually temporary] (1 in 500),  bleeding (1 in 200), allergic reaction [possibly serious] (1 in 200)], benefits (diagnostic support and management of coronary artery disease) and alternatives of a cardiac catheterization were discussed in detail with Ms. Renbarger and she is willing to proceed.     Dispo: Patient to return to clinic to see MD/APP in 1 to 2 weeks postprocedure  Signed, Demario Faniel, NP

## 2022-11-20 NOTE — Patient Instructions (Signed)
Medication Instructions:  No changes at this time.   *If you need a refill on your cardiac medications before your next appointment, please call your pharmacy*   Lab Work: CBC & BMP today here in our office.   If you have labs (blood work) drawn today and your tests are completely normal, you will receive your results only by: MyChart Message (if you have MyChart) OR A paper copy in the mail If you have any lab test that is abnormal or we need to change your treatment, we will call you to review the results.   Testing/Procedures:  [place] [facility]PT [facility] [address] [place] [address] Dept: [phone] Loc: [phone]  Erica Estrada  11/20/2022  You are scheduled for a Cardiac Catheterization on Tuesday, September 17 with Dr. Cristal Deer End.  1. Please arrive at the [facility] Entrance of [facility], [address], [place] [address] at 8:30 AM (This is 1 hour(s) prior to your procedure time).  Proceed to the Check-In Desk directly inside the entrance.  Procedure Parking: Use the entrance off of the [address] side of the hospital. Turn right upon entering and follow the driveway to parking that is directly in front of the [facility] Center. There is no valet parking available at this entrance, however there is an awning directly in front of the [facility] Vascular Center for drop off/ pick up for patients.  Special note: Every effort is made to have your procedure done on time. Please understand that emergencies sometimes delay scheduled procedures.  2. Diet: Do not eat solid foods after midnight.  The patient may have clear liquids until 5am upon the day of the procedure.  3. Labs: You will need to have blood drawn today.  4. Medication instructions in preparation for your procedure:   Contrast Allergy: No   Take only 30 units of insulin the  night before your procedure. Do not take any insulin on the day of the procedure.  Do not take Diabetes Med Glucophage (Metformin) on the day of the procedure and HOLD 48 HOURS AFTER THE PROCEDURE.  On the morning of your procedure, take your Aspirin 81 mg and any morning medicines NOT listed above.  You may use sips of water.  5. Plan to go home the same day, you will only stay overnight if medically necessary. 6. Bring a current list of your medications and current insurance cards. 7. You MUST have a responsible person to drive you home. 8. Someone MUST be with you the first 24 hours after you arrive home or your discharge will be delayed. 9. Please wear clothes that are easy to get on and off and wear slip-on shoes.  Thank you for allowing [place] to care for you!   -- [place] [facility]    Follow-Up: At [facility], you and your health needs are our priority.  As part of our continuing mission to provide you with exceptional heart care, we have created designated Provider Care Teams.  These Care Teams include your primary Cardiologist (physician) and Advanced Practice Providers (APPs -  Physician Assistants and Nurse Practitioners) who all work together to provide you with the care you need, when you need it.  We recommend signing up for the patient portal called "MyChart".  Sign up information is provided on this After Visit Summary.  MyChart is used to  connect with patients for Virtual Visits (Telemedicine).  Patients are able to view lab/test results, encounter notes, upcoming appointments, etc.  Non-urgent messages can be sent to your provider as well.   To learn more about what you can do with MyChart, go to [email or web address].    Your next appointment:   2 week(s)  Provider:   Debbe Odea, MD or Charlsie Quest, NP

## 2022-11-21 LAB — CBC
Hematocrit: 37.6 % (ref 34.0–46.6)
Hemoglobin: 12.2 g/dL (ref 11.1–15.9)
MCH: 28.1 pg (ref 26.6–33.0)
MCHC: 32.4 g/dL (ref 31.5–35.7)
MCV: 87 fL (ref 79–97)
Platelets: 270 10*3/uL (ref 150–450)
RBC: 4.34 x10E6/uL (ref 3.77–5.28)
RDW: 11.8 % (ref 11.7–15.4)
WBC: 10 10*3/uL (ref 3.4–10.8)

## 2022-11-21 LAB — BASIC METABOLIC PANEL
BUN/Creatinine Ratio: 12 (ref 12–28)
BUN: 9 mg/dL (ref 8–27)
CO2: 22 mmol/L (ref 20–29)
Calcium: 9.4 mg/dL (ref 8.7–10.3)
Chloride: 105 mmol/L (ref 96–106)
Creatinine, Ser: 0.76 mg/dL (ref 0.57–1.00)
Glucose: 163 mg/dL — ABNORMAL HIGH (ref 70–99)
Potassium: 4.2 mmol/L (ref 3.5–5.2)
Sodium: 144 mmol/L (ref 134–144)
eGFR: 82 mL/min/{1.73_m2} (ref >59)

## 2022-11-21 NOTE — Progress Notes (Signed)
Pre-procedure labs stable

## 2022-11-26 ENCOUNTER — Telehealth: Payer: Self-pay | Admitting: *Deleted

## 2022-11-26 NOTE — Telephone Encounter (Signed)
No answer/No voicemail box   Calling to review instructions and confirm her procedure for tomorrow

## 2022-11-26 NOTE — Telephone Encounter (Signed)
No answer no voice mail

## 2022-11-27 ENCOUNTER — Encounter: Payer: Self-pay | Admitting: Internal Medicine

## 2022-11-27 ENCOUNTER — Encounter
Admission: RE | Disposition: A | Discharge status: Home / Self Care | Payer: Self-pay | Source: Home / Self Care | Attending: Internal Medicine

## 2022-11-27 ENCOUNTER — Ambulatory Visit
Admission: RE | Admit: 2022-11-27 | Discharge: 2022-11-27 | Disposition: A | Discharge status: Home / Self Care | Payer: Medicare HMO | Attending: Internal Medicine | Admitting: Internal Medicine

## 2022-11-27 ENCOUNTER — Other Ambulatory Visit: Payer: Self-pay

## 2022-11-27 ENCOUNTER — Ambulatory Visit (HOSPITAL_BASED_OUTPATIENT_CLINIC_OR_DEPARTMENT_OTHER)
Admission: RE | Admit: 2022-11-27 | Discharge: 2022-11-27 | Disposition: A | Discharge status: Home / Self Care | Payer: Medicare HMO | Source: Home / Self Care | Attending: Internal Medicine | Admitting: Internal Medicine

## 2022-11-27 DIAGNOSIS — R0602 Shortness of breath: Secondary | ICD-10-CM | POA: Insufficient documentation

## 2022-11-27 DIAGNOSIS — I509 Heart failure, unspecified: Secondary | ICD-10-CM | POA: Insufficient documentation

## 2022-11-27 DIAGNOSIS — Z79899 Other long term (current) drug therapy: Secondary | ICD-10-CM | POA: Insufficient documentation

## 2022-11-27 DIAGNOSIS — Z87891 Personal history of nicotine dependence: Secondary | ICD-10-CM | POA: Insufficient documentation

## 2022-11-27 DIAGNOSIS — I2510 Atherosclerotic heart disease of native coronary artery without angina pectoris: Secondary | ICD-10-CM | POA: Diagnosis not present

## 2022-11-27 DIAGNOSIS — E119 Type 2 diabetes mellitus without complications: Secondary | ICD-10-CM | POA: Insufficient documentation

## 2022-11-27 DIAGNOSIS — R079 Chest pain, unspecified: Secondary | ICD-10-CM | POA: Diagnosis present

## 2022-11-27 DIAGNOSIS — I2584 Coronary atherosclerosis due to calcified coronary lesion: Secondary | ICD-10-CM | POA: Diagnosis not present

## 2022-11-27 DIAGNOSIS — I110 Hypertensive heart disease with heart failure: Secondary | ICD-10-CM | POA: Insufficient documentation

## 2022-11-27 DIAGNOSIS — R9439 Abnormal result of other cardiovascular function study: Secondary | ICD-10-CM | POA: Insufficient documentation

## 2022-11-27 DIAGNOSIS — E782 Mixed hyperlipidemia: Secondary | ICD-10-CM | POA: Diagnosis not present

## 2022-11-27 HISTORY — PX: RIGHT/LEFT HEART CATH AND CORONARY ANGIOGRAPHY: CATH118266

## 2022-11-27 LAB — POCT I-STAT 7, (LYTES, BLD GAS, ICA,H+H)
Acid-base deficit: 1 mmol/L (ref 0.0–2.0)
Bicarbonate: 25.7 mmol/L (ref 20.0–28.0)
Calcium, Ion: 1.25 mmol/L (ref 1.15–1.40)
HCT: 35 % — ABNORMAL LOW (ref 36.0–46.0)
Hemoglobin: 11.9 g/dL — ABNORMAL LOW (ref 12.0–15.0)
O2 Saturation: 92 %
Potassium: 4 mmol/L (ref 3.5–5.1)
Sodium: 142 mmol/L (ref 135–145)
TCO2: 27 mmol/L (ref 22–32)
pCO2 arterial: 48.4 mmHg — ABNORMAL HIGH (ref 32–48)
pH, Arterial: 7.333 — ABNORMAL LOW (ref 7.35–7.45)
pO2, Arterial: 70 mmHg — ABNORMAL LOW (ref 83–108)

## 2022-11-27 LAB — POCT I-STAT EG7
Acid-base deficit: 1 mmol/L (ref 0.0–2.0)
Bicarbonate: 26.5 mmol/L (ref 20.0–28.0)
Calcium, Ion: 1.24 mmol/L (ref 1.15–1.40)
HCT: 34 % — ABNORMAL LOW (ref 36.0–46.0)
Hemoglobin: 11.6 g/dL — ABNORMAL LOW (ref 12.0–15.0)
O2 Saturation: 68 %
Potassium: 3.9 mmol/L (ref 3.5–5.1)
Sodium: 142 mmol/L (ref 135–145)
TCO2: 28 mmol/L (ref 22–32)
pCO2, Ven: 53.5 mmHg (ref 44–60)
pH, Ven: 7.302 (ref 7.25–7.43)
pO2, Ven: 40 mmHg (ref 32–45)

## 2022-11-27 LAB — GLUCOSE, CAPILLARY
Glucose-Capillary: 159 mg/dL — ABNORMAL HIGH (ref 70–99)
Glucose-Capillary: 176 mg/dL — ABNORMAL HIGH (ref 70–99)

## 2022-11-27 SURGERY — RIGHT/LEFT HEART CATH AND CORONARY ANGIOGRAPHY
Anesthesia: Moderate Sedation | Laterality: Bilateral

## 2022-11-27 MED ORDER — MIDAZOLAM HCL 2 MG/2ML IJ SOLN
INTRAMUSCULAR | Status: DC | PRN
  Administered 2022-11-27: 1 mg via INTRAVENOUS

## 2022-11-27 MED ORDER — SODIUM CHLORIDE 0.9 % IV SOLN: INTRAVENOUS | Status: DC

## 2022-11-27 MED ORDER — ASPIRIN 81 MG PO CHEW
81.0000 mg | CHEWABLE_TABLET | ORAL | Status: AC
  Administered 2022-11-27: 81 mg via ORAL

## 2022-11-27 MED ORDER — SODIUM CHLORIDE 0.9% FLUSH: 3.0000 mL | INTRAVENOUS | Status: DC | PRN

## 2022-11-27 MED ORDER — VERAPAMIL HCL 2.5 MG/ML IV SOLN
INTRAVENOUS | Status: AC
  Filled 2022-11-27: qty 2

## 2022-11-27 MED ORDER — LIDOCAINE HCL 1 % IJ SOLN
INTRAMUSCULAR | Status: AC
  Filled 2022-11-27: qty 20

## 2022-11-27 MED ORDER — HEPARIN SODIUM (PORCINE) 1000 UNIT/ML IJ SOLN
INTRAMUSCULAR | Status: DC | PRN
  Administered 2022-11-27: 3500 [IU] via INTRAVENOUS

## 2022-11-27 MED ORDER — SODIUM CHLORIDE 0.9% FLUSH: 3.0000 mL | Freq: Two times a day (BID) | INTRAVENOUS | Status: DC

## 2022-11-27 MED ORDER — MIDAZOLAM HCL 2 MG/2ML IJ SOLN
INTRAMUSCULAR | Status: AC
  Filled 2022-11-27: qty 2

## 2022-11-27 MED ORDER — ASPIRIN 81 MG PO CHEW
CHEWABLE_TABLET | ORAL | Status: AC
  Filled 2022-11-27: qty 1

## 2022-11-27 MED ORDER — FENTANYL CITRATE (PF) 100 MCG/2ML IJ SOLN
INTRAMUSCULAR | Status: DC | PRN
  Administered 2022-11-27: 25 ug via INTRAVENOUS

## 2022-11-27 MED ORDER — HEPARIN (PORCINE) IN NACL 1000-0.9 UT/500ML-% IV SOLN
INTRAVENOUS | Status: AC
  Filled 2022-11-27: qty 1000

## 2022-11-27 MED ORDER — HEPARIN (PORCINE) IN NACL 2000-0.9 UNIT/L-% IV SOLN
INTRAVENOUS | Status: DC | PRN
  Administered 2022-11-27: 1000 mL

## 2022-11-27 MED ORDER — ISOSORBIDE MONONITRATE ER 30 MG PO TB24
30.0000 mg | ORAL_TABLET | Freq: Every day | ORAL | Status: DC
Start: 2022-11-27 — End: 2022-12-20

## 2022-11-27 MED ORDER — ACETAMINOPHEN 325 MG PO TABS: 650.0000 mg | ORAL_TABLET | ORAL | Status: DC | PRN

## 2022-11-27 MED ORDER — SODIUM CHLORIDE 0.9 % IV SOLN: 250.0000 mL | INTRAVENOUS | Status: DC | PRN

## 2022-11-27 MED ORDER — HEPARIN SODIUM (PORCINE) 1000 UNIT/ML IJ SOLN
INTRAMUSCULAR | Status: AC
  Filled 2022-11-27: qty 10

## 2022-11-27 MED ORDER — LIDOCAINE HCL (PF) 1 % IJ SOLN
INTRAMUSCULAR | Status: DC | PRN
  Administered 2022-11-27 (×2): 2 mL

## 2022-11-27 MED ORDER — VERAPAMIL HCL 2.5 MG/ML IV SOLN
INTRAVENOUS | Status: DC | PRN
  Administered 2022-11-27 (×2): 2.5 mg via INTRA_ARTERIAL

## 2022-11-27 MED ORDER — ONDANSETRON HCL 4 MG/2ML IJ SOLN: 4.0000 mg | Freq: Four times a day (QID) | INTRAMUSCULAR | Status: DC | PRN

## 2022-11-27 MED ORDER — LABETALOL HCL 5 MG/ML IV SOLN: 10.0000 mg | INTRAVENOUS | Status: DC | PRN

## 2022-11-27 MED ORDER — IOHEXOL 300 MG/ML  SOLN
INTRAMUSCULAR | Status: DC | PRN
  Administered 2022-11-27: 37 mL

## 2022-11-27 MED ORDER — HYDRALAZINE HCL 20 MG/ML IJ SOLN: 10.0000 mg | INTRAMUSCULAR | Status: DC | PRN

## 2022-11-27 MED ORDER — FENTANYL CITRATE (PF) 100 MCG/2ML IJ SOLN
INTRAMUSCULAR | Status: AC
  Filled 2022-11-27: qty 2

## 2022-11-27 SURGICAL SUPPLY — 14 items
CATH BALLN WEDGE 5F 110CM (CATHETERS) IMPLANT
CATH INFINITI 5 FR JL3.5 (CATHETERS) IMPLANT
CATH INFINITI AMBI 5FR JK (CATHETERS) IMPLANT
CATH INFINITI JR4 5F (CATHETERS) IMPLANT
DEVICE RAD TR BAND REGULAR (VASCULAR PRODUCTS) IMPLANT
DRAPE BRACHIAL (DRAPES) IMPLANT
GLIDESHEATH SLEND A-KIT 6F 22G (SHEATH) IMPLANT
GUIDEWIRE INQWIRE 1.5J.035X260 (WIRE) IMPLANT
INQWIRE 1.5J .035X260CM (WIRE) ×1
PACK CARDIAC CATH (CUSTOM PROCEDURE TRAY) ×1 IMPLANT
PROTECTION STATION PRESSURIZED (MISCELLANEOUS) ×1
SET ATX-X65L (MISCELLANEOUS) IMPLANT
SHEATH GLIDE SLENDER 4/5FR (SHEATH) IMPLANT
STATION PROTECTION PRESSURIZED (MISCELLANEOUS) IMPLANT

## 2022-11-27 NOTE — Interval H&P Note (Signed)
History and Physical Interval Note:  11/27/2022 9:39 AM  [name]  has presented today for surgery, with the diagnosis of chest pain, shortness of breath, and abnormal stress test.  The various methods of treatment have been discussed with the patient and family. After consideration of risks, benefits and other options for treatment, the patient has consented to  Procedure(s): RIGHT/LEFT HEART CATH AND CORONARY ANGIOGRAPHY (Bilateral) as a surgical intervention.  The patient's history has been reviewed, patient examined, no change in status, stable for surgery.  I have reviewed the patient's chart and labs.  Questions were answered to the patient's satisfaction.    Cath Lab Visit (complete for each Cath Lab visit)  Clinical Evaluation Leading to the Procedure:   ACS: No.  Non-ACS:    Anginal Classification: CCS IV  Anti-ischemic medical therapy: No Therapy  Non-Invasive Test Results: Intermediate-risk stress test findings: cardiac mortality 1-3%/year  Prior CABG: No previous CABG  Erica Estrada

## 2022-11-27 NOTE — Discharge Instructions (Signed)
Radial Site Care Refer to this sheet in the next few weeks. These instructions provide you with information about caring for yourself after your procedure. Your health care provider may also give you more specific instructions. Your treatment has been planned according to current medical practices, but problems sometimes occur. Call your health care provider if you have any problems or questions after your procedure. What can I expect after the procedure? After your procedure, it is typical to have the following: Bruising at the radial site that usually fades within 1-2 weeks. Blood collecting in the tissue (hematoma) that may be painful to the touch. It should usually decrease in size and tenderness within 1-2 weeks.  Follow these instructions at home: Take medicines only as directed by your health care provider. If you are on a medication called Metformin please do not take for 48 hours after your procedure. Over the next 48hrs please increase your fluid intake of water and non caffeine beverages to flush the contrast dye out of your system.  You may shower 24 hours after the procedure  Leave your bandage on and gently wash the site with plain soap and water. Pat the area dry with a clean towel. Do not rub the site, because this may cause bleeding.  Remove your dressing 48hrs after your procedure and leave open to air.  Do not submerge your site in water for 7 days. This includes swimming and washing dishes.  Check your insertion site every day for redness, swelling, or drainage. Do not apply powder or lotion to the site. Do not flex or bend the affected arm for 24 hours or as directed by your health care provider. Do not push or pull heavy objects with the affected arm for 24 hours or as directed by your health care provider. Do not lift over 10 lb (4.5 kg) for 5 days after your procedure or as directed by your health care provider. Ask your health care provider when it is okay to: Return to  work or school. Resume usual physical activities or sports. Resume sexual activity. Do not drive home if you are discharged the same day as the procedure. Have someone else drive you. You may drive 48 hours after the procedure Do not operate machinery or power tools for 24 hours after the procedure. If your procedure was done as an outpatient procedure, which means that you went home the same day as your procedure, a responsible adult should be with you for the first 24 hours after you arrive home. Keep all follow-up visits as directed by your health care provider. This is important. Contact a health care provider if: You have a fever. You have chills. You have increased bleeding from the radial site. Hold pressure on the site. Get help right away if: You have unusual pain at the radial site. You have redness, warmth, or swelling at the radial site. You have drainage (other than a small amount of blood on the dressing) from the radial site. The radial site is bleeding, and the bleeding does not stop after 15 minutes of holding steady pressure on the site. Your arm or hand becomes pale, cool, tingly, or numb. This information is not intended to replace advice given to you by your health care provider. Make sure you discuss any questions you have with your health care provider. Document Released: 03/31/2010 Document Revised: 08/04/2015 Document Reviewed: 09/14/2013 Elsevier Interactive Patient Education  2018 [employer].

## 2022-11-27 NOTE — Progress Notes (Signed)
*  PRELIMINARY RESULTS* Echocardiogram 2D Echocardiogram has been performed.  Erica Estrada 11/27/2022, 12:58 PM

## 2022-11-28 ENCOUNTER — Encounter: Payer: Self-pay | Admitting: Internal Medicine

## 2022-11-28 LAB — ECHOCARDIOGRAM COMPLETE
Height: 65 in
Weight: 2592 [oz_av]

## 2022-12-05 ENCOUNTER — Encounter: Payer: Self-pay | Admitting: *Deleted

## 2022-12-05 ENCOUNTER — Institutional Professional Consult (permissible substitution) (INDEPENDENT_AMBULATORY_CARE_PROVIDER_SITE_OTHER): Payer: Medicare HMO | Admitting: Surgery

## 2022-12-05 ENCOUNTER — Other Ambulatory Visit: Payer: Self-pay | Admitting: *Deleted

## 2022-12-05 ENCOUNTER — Encounter: Payer: Self-pay | Admitting: Surgery

## 2022-12-05 VITALS — BP 141/79 | HR 92 | Resp 18 | Ht 65.0 in | Wt 164.0 lb

## 2022-12-05 DIAGNOSIS — I2510 Atherosclerotic heart disease of native coronary artery without angina pectoris: Secondary | ICD-10-CM | POA: Diagnosis not present

## 2022-12-07 NOTE — Progress Notes (Signed)
Cardiothoracic Surgery Consultation  PCP is [name], MD Referring Provider is End, Cristal Deer, MD  Chief Complaint  Patient presents with   Coronary Artery Disease    Cath 9/17, ECHO 9/17    HPI: The patient is a 72 year old woman with history of type 2 diabetes, hypertension, hyperlipidemia, previous smoking, and coronary artery disease who was referred for consideration of coronary bypass surgery.  She had a Lexiscan Myoview study in 02/2021 which showed no significant ischemia.  A subsequent study on 10/09/2022 showed evidence of infarction and ischemia.  Left ventricular function was normal.  It was felt to be a very suboptimal study due to intense GI uptake at the border of the inferior wall.  There was felt that the anterior wall reversible defect may be due to artifact.  She subsequently underwent cardiac catheterization on 11/27/2022 showing severe three-vessel coronary disease with mildly elevated right heart and pulm artery pressures.  There was 70% mid LAD stenosis that was severely calcified.  There were 70% and 90% mid left circumflex stenoses.  The right coronary artery had moderate 40 to 50% mid vessel stenoses.  There is 80% distal stenosis before the posterior descending branch.  Echocardiogram on 11/27/2022 showed an ejection fraction of 55 to 60% with grade 1 diastolic dysfunction.  There is mild mitral regurgitation.  She reports having episodes of chest tightness and shortness of breath and fatigue have been occurring with exertion as well as occasionally at rest while sleeping.  She has been on Imdur.  Past Medical History:  Diagnosis Date   Arthritis    Asthma    Coronary artery calcification    Noted on prior CTA chest (01/2021)   Diabetes mellitus without complication (HCC)    Former tobacco use    Graves' disease    Hepatitis 1995   Hep C.  Treated.   Hyperlipidemia    Hypertension    Precordial chest pain    MV 02/2021 low risk, no significant  ischemia    Past Surgical History:  Procedure Laterality Date   ABDOMINAL HYSTERECTOMY     EYE SURGERY     RIGHT/LEFT HEART CATH AND CORONARY ANGIOGRAPHY Bilateral 11/27/2022   Procedure: RIGHT/LEFT HEART CATH AND CORONARY ANGIOGRAPHY;  Surgeon: Yvonne Kendall, MD;  Location: [facility] INVASIVE CV [facility];  Service: Cardiovascular;  Laterality: Bilateral;    Family History  Problem Relation Age of Onset   Breast cancer Neg Hx     Social History Social History   Tobacco Use   Smoking status: Former    Current packs/day: 0.00    Average packs/day: 0.5 packs/day for 33.0 years (16.5 ttl pk-yrs)    Types: Cigarettes    Start date: 08/11/1986    Quit date: 08/11/2019    Years since quitting: 3.3   Smokeless tobacco: Never  Vaping Use   Vaping status: Never Used  Substance Use Topics   Alcohol use: No   Drug use: No    Current Outpatient Medications  Medication Sig Dispense Refill   acetaminophen (TYLENOL) 650 MG CR tablet Take 1,300 mg by mouth 2 (two) times daily as needed for pain.     ACETAMINOPHEN 8 HOUR 650 MG CR tablet Take 650 mg by mouth every 8 (eight) hours as needed for pain.     amLODipine (NORVASC) 10 MG tablet Take 1 tablet (10 mg total) by mouth daily. 30 tablet 0   Artificial Saliva (BIOTENE MOISTURIZING MOUTH MT) Use as directed 1-2 sprays in the mouth or  throat 4 (four) times daily as needed.     ASPERCREME LIDOCAINE 4 % CREA Apply 1 application topically 2 (two) times daily. Apply to back and knees prn for pain     ASPIRIN 81 PO Take by mouth daily.     atorvastatin (LIPITOR) 80 MG tablet Take 1 tablet (80 mg total) by mouth daily. 30 tablet 0   calcium carbonate (TUMS - DOSED IN MG ELEMENTAL CALCIUM) 500 MG chewable tablet Chew 2 tablets by mouth 2 (two) times daily as needed for indigestion or heartburn.     camphor-menthol (SARNA) lotion Apply 1 application topically as needed for itching.     chlorhexidine (PERIDEX) 0.12 % solution Use as directed 15 mLs in the  mouth or throat 2 (two) times daily as needed. Swish for 30 seconds then spit     Cholecalciferol 25 MCG (1000 UT) capsule Take 1,000 Units by mouth daily.     clobetasol cream (TEMOVATE) 0.05 % Apply 1 application topically 2 (two) times a week. Apply to vulva     clotrimazole (LOTRIMIN) 1 % cream Apply 1 application topically 2 (two) times daily.     cyclobenzaprine (FLEXERIL) 10 MG tablet Take 10 mg by mouth 3 (three) times daily.     diclofenac Sodium (VOLTAREN) 1 % GEL Apply 1 Application topically 4 (four) times daily.     ferrous sulfate 325 (65 FE) MG tablet Take 1 tablet (325 mg total) by mouth daily. 90 tablet 0   folic acid (FOLVITE) 1 MG tablet Take 1 mg by mouth daily.     HYDROcodone-acetaminophen (NORCO/VICODIN) 5-325 MG tablet Take 1 tablet by mouth as needed.     ibuprofen (ADVIL) 800 MG tablet Take 800 mg by mouth 3 (three) times daily.     insulin degludec (TRESIBA FLEXTOUCH) 200 UNIT/ML FlexTouch Pen Inject 60 Units into the skin at bedtime.     isosorbide mononitrate (IMDUR) 30 MG 24 hr tablet Take 1 tablet (30 mg total) by mouth daily.     JARDIANCE 25 MG TABS tablet Take 25 mg by mouth daily.     Lancets (ONETOUCH DELICA PLUS LANCET33G) MISC Apply topically.     metFORMIN (GLUCOPHAGE) 500 MG tablet Take 1,000 mg by mouth 2 (two) times daily with a meal.     methimazole (TAPAZOLE) 5 MG tablet Take 2.5 mg by mouth daily.     mometasone (NASONEX) 50 MCG/ACT nasal spray Place 2 sprays into the nose daily as needed.     nitroGLYCERIN (NITROSTAT) 0.4 MG SL tablet Place 0.4 mg under the tongue every 5 (five) minutes as needed for chest pain.     omeprazole (PRILOSEC) 40 MG capsule Take 1 capsule by mouth daily.     ONETOUCH ULTRA test strip USE AS DIRECTED FOR BLOOD SUGAR TESTING THREE TIMES DAILY FOR DIABETES E11.9     PARoxetine (PAXIL) 40 MG tablet Take 1 tablet (40 mg total) by mouth every morning. 30 tablet 0   predniSONE (DELTASONE) 20 MG tablet Take 20 mg by mouth daily.      pregabalin (LYRICA) 100 MG capsule Take 100 mg by mouth 2 (two) times daily.     pregabalin (LYRICA) 100 MG capsule Take 1 capsule (100 mg total) by mouth 2 (two) times daily. 60 capsule 0   sertraline (ZOLOFT) 25 MG tablet Take 25 mg by mouth daily.     SPIRIVA HANDIHALER 18 MCG inhalation capsule Place 1 capsule into inhaler and inhale daily.     tiZANidine (  ZANAFLEX) 2 MG tablet Take 2 mg by mouth 3 (three) times daily.     traMADol (ULTRAM) 50 MG tablet Take 50 mg by mouth at bedtime.     traZODone (DESYREL) 50 MG tablet Take 50 mg by mouth at bedtime.     triamcinolone ointment (KENALOG) 0.1 % SMARTSIG:1 Application Topical 2-3 Times Daily     Vaginal Lubricant (REPLENS) GEL Place 1 application vaginally 3 (three) times daily as needed. Apply to vagina and vulva as needed for itching. Use on days that you do not use clobetasol     VENTOLIN HFA 108 (90 Base) MCG/ACT inhaler Inhale 2 puffs into the lungs every 6 (six) hours as needed.     No current facility-administered medications for this visit.    Allergies  Allergen Reactions   Ace Inhibitors Cough    Chronic cough for 3 months. Trial off lisinopril starting 05/11/2011 effective in stopping cough.   Fluticasone Other (See Comments)    choking    Review of Systems  Constitutional:  Positive for activity change and fatigue.  HENT: Negative.    Eyes: Negative.   Respiratory:  Positive for shortness of breath.   Cardiovascular:  Positive for chest pain. Negative for leg swelling.  Gastrointestinal: Negative.   Endocrine: Negative.   Genitourinary: Negative.   Musculoskeletal:  Positive for arthralgias.  Skin: Negative.   Allergic/Immunologic: Negative.   Neurological:  Negative for dizziness and syncope.  Hematological: Negative.   Psychiatric/Behavioral: Negative.      BP (!) 141/79 (BP Location: Left Arm, Patient Position: Sitting)   Pulse 92   Resp 18   Ht 5\' 5"  (1.651 m)   Wt 164 lb (74.4 kg)   SpO2 98% Comment:  RA  BMI 27.29 kg/m  Physical Exam Constitutional:      Appearance: Normal appearance.  HENT:     Head: Normocephalic and atraumatic.  Eyes:     Extraocular Movements: Extraocular movements intact.     Pupils: Pupils are equal, round, and reactive to light.  Neck:     Vascular: No carotid bruit.  Cardiovascular:     Rate and Rhythm: Normal rate and regular rhythm.     Heart sounds: No murmur heard. Pulmonary:     Effort: Pulmonary effort is normal.     Breath sounds: Normal breath sounds.  Abdominal:     General: There is no distension.     Tenderness: There is no abdominal tenderness.  Musculoskeletal:        General: No swelling.     Cervical back: Normal range of motion.  Skin:    General: Skin is warm and dry.  Neurological:     General: No focal deficit present.     Mental Status: She is alert and oriented to person, place, and time.  Psychiatric:        Mood and Affect: Mood normal.        Behavior: Behavior normal.      Diagnostic Tests:  Physicians  Panel Physicians Referring Physician Case Authorizing Physician  End, Christopher, MD (Primary)     Procedures  RIGHT/LEFT HEART CATH AND CORONARY ANGIOGRAPHY   Conclusion  Conclusions: Severe three-vessel coronary artery disease, as detailed below. Normal left heart filling pressures. Mildly elevated right heart and pulmonary artery pressures. Normal Fick cardiac output/index.   Recommendations: Refer to cardiac surgery for CABG evaluation in the setting of three-vessel coronary artery disease and diabetes mellitus. Obtain echocardiogram prior to discharge today. Increase isosorbide mononitrate to  30 mg daily for additional antianginal therapy. Aggressive secondary prevention of coronary artery disease.   Yvonne Kendall, MD [facility]     Recommendations  Antiplatelet/Anticoag Continue aspirin 81 mg daily pending cardiac surgery evaluation.  Discharge Date In the absence of any other  complications or medical issues, we expect the patient to be ready for discharge from a cath perspective on 11/27/2022.   Indications  Shortness of breath [R06.02 (ICD-10-CM)]  Chest pain of uncertain etiology [R07.9 (ICD-10-CM)]  Abnormal stress test [R94.39 (ICD-10-CM)]   Clinical Presentation  CHF/Shock Congestive heart failure present. NYHA Class III. No shock present.   Procedural Details  Technical Details Indication: 72 y.o. year-old woman with history of  coronary artery calcification on chest CT, hypertension, hyperlipidemia, type 2 diabetes, former smoker x 20+ years, referred for evaluation of abnormal stress test in the setting of progressive chest pain and shortness of breath.  GFR: >60 ml/min  Procedure: The risks, benefits, complications, treatment options, and expected outcomes were discussed with the patient. The patient and/or family concurred with the proposed plan, giving informed consent. The right wrist and elbow were prepped and draped in a sterile fashion. 1% lidocaine was used for local anesthesia. A previously placed antecubital vein IV was exchanged for a 730F slender Glidesheath using modified Seldinger technique. Right heart catheterization was performed by advancing a 730F balloon-tipped catheter through the right heart chambers into the pulmonary capillary wedge position. Pressure measurements and oxygen saturations were obtained.  Using the modified Seldinger access technique, a 30F slender Glidesheath was placed in the right radial artery. 3 mg Verapamil was given through the sheath. Heparin 3,500 units were administered. Selective coronary angiography was performed using a 730F JL3.5 catheter to engage the left coronary artery and a 730F JR4 catheter to engage the right coronary artery. Left heart catheterization was performed using a 730F JR4 catheter. Left ventriculogram was performed with a power injection of contrast.  At the end of the procedure, the radial artery  sheath was removed and a TR band applied to achieve patent hemostasis. The antecubital vein sheath was removed and manual compression applied to achieve patent hemostasis. There were no immediate complications. The patient was taken to the recovery area in stable condition.   Estimated blood loss <50 mL.   During this procedure medications were administered to achieve and maintain moderate conscious sedation while the patient's heart rate, blood pressure, and oxygen saturation were continuously monitored and I was present face-to-face 100% of this time.   Medications (Filter: Administrations occurring from 0948 to 1052 on 11/27/22)  important  Continuous medications are totaled by the amount administered until 11/27/22 1052.   midazolam (VERSED) injection (mg)  Total dose: 1 mg Date/Time Rate/Dose/Volume Action   11/27/22 1002 1 mg Given   fentaNYL (SUBLIMAZE) injection (mcg)  Total dose: 25 mcg Date/Time Rate/Dose/Volume Action   11/27/22 1002 25 mcg Given   lidocaine (PF) (XYLOCAINE) 1 % injection (mL)  Total volume: 4 mL Date/Time Rate/Dose/Volume Action   11/27/22 1009 2 mL Given   1014 2 mL Given   Heparin (Porcine) in NaCl 2000-0.9 UNIT/L-% SOLN (mL)  Total volume: 1,000 mL Date/Time Rate/Dose/Volume Action   11/27/22 1014 1,000 mL Given   verapamil (ISOPTIN) injection (mg)  Total dose: 5 mg Date/Time Rate/Dose/Volume Action   11/27/22 1017 2.5 mg Given   1035 2.5 mg Given   heparin sodium (porcine) injection (Units)  Total dose: 3,500 Units Date/Time Rate/Dose/Volume Action   11/27/22 1020 3,500 Units Given  iohexol (OMNIPAQUE) 300 MG/ML solution (mL)  Total volume: 37 mL Date/Time Rate/Dose/Volume Action   11/27/22 1047 37 mL Given    Sedation Time  Sedation Time Physician-1: 33 minutes 25 seconds Contrast     Administrations occurring from 0948 to 1052 on 11/27/22:  Medication Name Total Dose  iohexol (OMNIPAQUE) 300 MG/ML solution 37 mL    Radiation/Fluoro  Fluoro time: 6.3 (min) DAP: 22.7 (Gycm2) Cumulative Air Kerma: 305 (mGy) Complications  Complications documented before study signed (11/28/2022  7:19 AM)   No complications were associated with this study.  Documented by Santiago Bur - 11/27/2022 10:46 AM     Coronary Findings  Diagnostic Dominance: Right Left Main  Vessel is large. Vessel is angiographically normal.    Left Anterior Descending  Vessel is moderate in size.  Mid LAD lesion is 70% stenosed. The lesion is severely calcified.    First Diagonal Branch  Vessel is small in size.    Second Diagonal Branch  Vessel is moderate in size.    Third Diagonal Branch  Vessel is small in size.    Left Circumflex  Vessel is moderate in size.  Mid Cx lesion is 70% stenosed.  Mid Cx to Dist Cx lesion is 90% stenosed.    First Obtuse Marginal Branch  Vessel is moderate in size.    Second Obtuse Marginal Branch  Vessel is small in size.    Third Obtuse Marginal Branch  Vessel is small in size.    Right Coronary Artery  Vessel is moderate in size. The vessel is moderately tortuous.  Mid RCA-1 lesion is 40% stenosed.  Mid RCA-2 lesion is 50% stenosed.  Dist RCA lesion is 80% stenosed.    Right Posterior Descending Artery  Vessel is small in size.    Right Posterior Atrioventricular Artery  Vessel is small in size.    First Right Posterolateral Branch  Vessel is small in size.    Second Right Posterolateral Branch  Vessel is small in size.    Intervention   No interventions have been documented.   Right Heart  Right Heart Pressures RA (mean): 7 mmHg RV (S/EDP): 35/8 mmHg PA (S/D, mean): 35/15 (22) mmHg PCWP (mean): 11 mmHg  Ao sat: 92% PA sat: 60%  Fick CO: 4.8 L/min Fick CI: 2.6 L/min/m^2   Wall Motion     The following segments are normal: mid anterior, mid inferior, basilar anterior, basilar inferior, apical anterior and apical inferior.           Left  Heart  Left Ventricle The left ventricular size is normal. The left ventricular systolic function is normal. LV end diastolic pressure is normal. LVEDP 11 mmHg. The left ventricular ejection fraction is 55-65% by visual estimate.  Aortic Valve There is no aortic valve stenosis.   Coronary Diagrams  Diagnostic Dominance: Right  Intervention   Implants   No implant documentation for this case.   Syngo Images   Show images for CARDIAC CATHETERIZATION Images on Long Term Storage   Show images for Anahlia, [place] to Procedure Log  Procedure Log   Link to Procedure Log  Procedure Log    Hemo Data  Flowsheet Row Most Recent Value  Fick Cardiac Output 4.77 L/min  Fick Cardiac Output Index 2.63 (L/min)/BSA  RA A Wave 7 mmHg  RA V Wave 6 mmHg  RA Mean 3 mmHg  RV Systolic Pressure 34 mmHg  RV Diastolic Pressure 3 mmHg  RV EDP 9 mmHg  PA  Systolic Pressure 34 mmHg  PA Diastolic Pressure 12 mmHg  PA Mean 22 mmHg  PW A Wave 10 mmHg  PW V Wave 9 mmHg  PW Mean 8 mmHg  AO Systolic Pressure 134 mmHg  AO Diastolic Pressure 64 mmHg  AO Mean 94 mmHg  LV Systolic Pressure 141 mmHg  LV Diastolic Pressure -2 mmHg  LV EDP 13 mmHg  AOp Systolic Pressure 135 mmHg  AOp Diastolic Pressure 64 mmHg  AOp Mean Pressure 96 mmHg  LVp Systolic Pressure 141 mmHg  LVp Diastolic Pressure 8 mmHg  LVp EDP Pressure 16 mmHg  QP/QS 1  TPVR Index 8.35 HRUI  TSVR Index 33.78 HRUI  PVR SVR Ratio 0.16  TPVR/TSVR Ratio 0.25   ECHOCARDIOGRAM REPORT       Patient Name:   Erica Estrada Date of Exam: 11/27/2022  Medical Rec #:  [id number]        Height:       65.0 in  Accession #:    [id number]       Weight:       162.0 lb  Date of Birth:  May 13, 1950        BSA:          1.809 m  Patient Age:    72 years         BP:           151/80 mmHg  Patient Gender: F                HR:           69 bpm.  Exam Location:  [facility]   Procedure: 2D Echo, Cardiac Doppler and Color Doppler   Indications:      CAD native vessel I25.10    History:         Patient has prior history of Echocardiogram examinations,  most                  recent 01/23/2021. Risk Factors:Diabetes and  Hypertension.    Sonographer:    Cristela Blue  Referring Phys:  [id number] CHRISTOPHER END  Diagnosing Phys: Yvonne Kendall MD   IMPRESSIONS     1. Left ventricular ejection fraction, by estimation, is 55 to 60%. The  left ventricle has normal function. The left ventricle has no regional  wall motion abnormalities. Left ventricular diastolic parameters are  consistent with Grade I diastolic  dysfunction (impaired relaxation).   2. Right ventricular systolic function is normal. The right ventricular  size is normal. Tricuspid regurgitation signal is inadequate for assessing  PA pressure.   3. The mitral valve is degenerative. Mild mitral valve regurgitation. No  evidence of mitral stenosis.   4. The aortic valve is tricuspid. There is mild thickening of the aortic  valve. Aortic valve regurgitation is not visualized. Aortic valve  sclerosis is present, with no evidence of aortic valve stenosis.   FINDINGS   Left Ventricle: Left ventricular ejection fraction, by estimation, is 55  to 60%. The left ventricle has normal function. The left ventricle has no  regional wall motion abnormalities. The left ventricular internal cavity  size was normal in size. There is   no left ventricular hypertrophy. Left ventricular diastolic parameters  are consistent with Grade I diastolic dysfunction (impaired relaxation).   Right Ventricle: The right ventricular size is normal. No increase in  right ventricular wall thickness. Right ventricular systolic function is  normal. Tricuspid regurgitation signal is inadequate for assessing PA  pressure.  Left Atrium: Left atrial size was normal in size.   Right Atrium: Right atrial size was normal in size.   Pericardium: The pericardium was not well visualized.   Mitral Valve: The  mitral valve is degenerative in appearance. There is  mild thickening of the mitral valve leaflet(s). Mild mitral valve  regurgitation. No evidence of mitral valve stenosis. MV peak gradient, 5.0  mmHg. The mean mitral valve gradient is  1.0 mmHg.   Tricuspid Valve: The tricuspid valve is normal in structure. Tricuspid  valve regurgitation is trivial.   Aortic Valve: The aortic valve is tricuspid. There is mild thickening of  the aortic valve. There is mild aortic valve annular calcification. Aortic  valve regurgitation is not visualized. Aortic valve sclerosis is present,  with no evidence of aortic valve   stenosis. Aortic valve mean gradient measures 3.3 mmHg. Aortic valve peak  gradient measures 5.8 mmHg. Aortic valve area, by VTI measures 1.87 cm.   Pulmonic Valve: The pulmonic valve was normal in structure. Pulmonic valve  regurgitation is trivial.   Aorta: The aortic root is normal in size and structure.   Pulmonary Artery: The pulmonary artery is not well seen.   Venous: The inferior vena cava was not well visualized.   IAS/Shunts: The interatrial septum was not well visualized.     LEFT VENTRICLE  PLAX 2D  LVIDd:         3.70 cm   Diastology  LVIDs:         2.60 cm   LV e' medial:    6.96 cm/s  LV PW:         0.92 cm   LV E/e' medial:  11.5  LV IVS:        1.06 cm   LV e' lateral:   10.00 cm/s  LVOT diam:     2.00 cm   LV E/e' lateral: 8.0  LV SV:         49  LV SV Index:   27  LVOT Area:     3.14 cm     RIGHT VENTRICLE  RV Basal diam:  2.60 cm  RV Mid diam:    2.40 cm  RV S prime:     9.79 cm/s  TAPSE (M-mode): 2.2 cm   LEFT ATRIUM             Index        RIGHT ATRIUM          Index  LA diam:        2.60 cm 1.44 cm/m   RA Area:     9.24 cm  LA Vol (A2C):   31.9 ml 17.64 ml/m  RA Volume:   17.90 ml 9.90 ml/m  LA Vol (A4C):   31.0 ml 17.14 ml/m  LA Biplane Vol: 32.5 ml 17.97 ml/m   AORTIC VALVE  AV Area (Vmax):    2.02 cm  AV Area (Vmean):   1.88  cm  AV Area (VTI):     1.87 cm  AV Vmax:           120.67 cm/s  AV Vmean:          86.367 cm/s  AV VTI:            0.264 m  AV Peak Grad:      5.8 mmHg  AV Mean Grad:      3.3 mmHg  LVOT Vmax:         77.40  cm/s  LVOT Vmean:        51.800 cm/s  LVOT VTI:          0.157 m  LVOT/AV VTI ratio: 0.59    AORTA  Ao Root diam: 3.00 cm   MITRAL VALVE  MV Area (PHT): 3.63 cm     SHUNTS  MV Area VTI:   1.74 cm     Systemic VTI:  0.16 m  MV Peak grad:  5.0 mmHg     Systemic Diam: 2.00 cm  MV Mean grad:  1.0 mmHg  MV Vmax:       1.12 m/s  MV Vmean:      52.3 cm/s  MV Decel Time: 209 msec  MV E velocity: 80.20 cm/s  MV A velocity: 103.00 cm/s  MV E/A ratio:  0.78   Cristal Deer End MD  Electronically signed by Yvonne Kendall MD  Signature Date/Time: 11/28/2022/5:19:58 AM        Final      Impression:  This 72 year old diabetic woman has severe three-vessel coronary disease with worsening symptoms of chest discomfort, shortness of breath, and fatigue.  I agree that coronary artery bypass graft surgery is the best treatment for her for symptom relief and to prevent further ischemia and infarction.  I reviewed the cardiac catheterization images with her and her family and answered their questions. I discussed the operative procedure with the patient and family including alternatives, benefits and risks; including but not limited to bleeding, blood transfusion, infection, stroke, myocardial infarction, graft failure, heart block requiring a permanent pacemaker, organ dysfunction, and death.  [name] understands and agrees to proceed.     Plan:  She will be scheduled for coronary bypass graft surgery on Thursday, 12/13/2022.  I spent 60 minutes performing this consultation and > 50% of this time was spent face to face counseling and coordinating the care of this patient's severe multivessel coronary disease.  Alleen Borne, MD [facility] [phone]

## 2022-12-10 NOTE — Progress Notes (Signed)
Surgical Instructions   Your procedure is scheduled on Thursday December 13, 2022. Report to [facility] Main Entrance "A" at 5:30 A.M., then check in with the Admitting office. Any questions or running late day of surgery: call [phone]  Questions prior to your surgery date: call [phone], Monday-Friday, 8am-4pm. If you experience any cold or flu symptoms such as cough, fever, chills, shortness of breath, etc. between now and your scheduled surgery, please notify us at the above number.     Remember:  Do not eat or drink after midnight the night before your surgery  Take these medicines the morning of surgery with A SIP OF WATER  amLODipine (NORVASC)  atorvastatin (LIPITOR)  cyclobenzaprine (FLEXERIL)  isosorbide mononitrate (IMDUR) methimazole (TAPAZOLE)   omeprazole (PRILOSEC)  PARoxetine (PAXIL)  predniSONE (DELTASONE)  pregabalin (LYRICA)  sertraline (ZOLOFT)  SPIRIVA HANDIHALER 18 MCG inhalation capsule  tiZANidine (ZANAFLEX)    May take these medicines IF NEEDED: acetaminophen (TYLENOL)  HYDROcodone-acetaminophen (NORCO/VICODIN)  mometasone (NASONEX)  nitroGLYCERIN (NITROSTAT)  VENTOLIN HFA. Please bring inhaler with you to the hospital    Follow your surgeon's instructions on when to stop Asprin.  If no instructions were given by your surgeon then you will need to call the office to get those instructions.     One week prior to surgery, STOP taking any Aleve, Naproxen, Ibuprofen, Motrin, Advil, Goody's, BC's, all herbal medications, fish oil, and non-prescription vitamins.  This includes your diclofenac Sodium (VOLTAREN) 1 % GEL.     WHAT DO I DO ABOUT MY DIABETES MEDICATION?   Do not take oral diabetes medicines (pills) the morning of surgery.         Per your surgeon, STOPmetFORMIN (GLUCOPHAGE) 2 days prior to surgery, with the last dose being 12/10/2022.         Per your surgeon, STOP JARDIANCE 3 days prior to surgery, with the last dose being  12/09/2022.   THE NIGHT BEFORE SURGERY, take 30 units of insulin degludec (TRESIBA FLEXTOUCH).  This 50% of your regular dose.        The day of surgery, do not take other diabetes injectables, including Byetta (exenatide), Bydureon (exenatide ER), Victoza (liraglutide), or Trulicity (dulaglutide).  If your CBG is greater than 220 mg/dL, you may take  of your sliding scale (correction) dose of insulin.   HOW TO MANAGE YOUR DIABETES BEFORE AND AFTER SURGERY  Why is it important to control my blood sugar before and after surgery? Improving blood sugar levels before and after surgery helps healing and can limit problems. A way of improving blood sugar control is eating a healthy diet by:  Eating less sugar and carbohydrates  Increasing activity/exercise  Talking with your doctor about reaching your blood sugar goals High blood sugars (greater than 180 mg/dL) can raise your risk of infections and slow your recovery, so you will need to focus on controlling your diabetes during the weeks before surgery. Make sure that the doctor who takes care of your diabetes knows about your planned surgery including the date and location.  How do I manage my blood sugar before surgery? Check your blood sugar at least 4 times a day, starting 2 days before surgery, to make sure that the level is not too high or low.  Check your blood sugar the morning of your surgery when you wake up and every 2 hours until you get to the Short Stay unit.  If your blood sugar is less than 70 mg/dL, you will need to  treat for low blood sugar: Do not take insulin. Treat a low blood sugar (less than 70 mg/dL) with  cup of clear juice (cranberry or apple), 4 glucose tablets, OR glucose gel. Recheck blood sugar in 15 minutes after treatment (to make sure it is greater than 70 mg/dL). If your blood sugar is not greater than 70 mg/dL on recheck, call [phone] for further instructions. Report your blood sugar to the short  stay nurse when you get to Short Stay.  If you are admitted to the hospital after surgery: Your blood sugar will be checked by the staff and you will probably be given insulin after surgery (instead of oral diabetes medicines) to make sure you have good blood sugar levels. The goal for blood sugar control after surgery is 80-180 mg/dL.                      Do NOT Smoke (Tobacco/Vaping) for 24 hours prior to your procedure.  If you use a CPAP at night, you may bring your mask/headgear for your overnight stay.   You will be asked to remove any contacts, glasses, piercing's, hearing aid's, dentures/partials prior to surgery. Please bring cases for these items if needed.    Patients discharged the day of surgery will not be allowed to drive home, and someone needs to stay with them for 24 hours.  SURGICAL WAITING ROOM VISITATION Patients may have no more than 2 support people in the waiting area - these visitors may rotate.   Pre-op nurse will coordinate an appropriate time for 1 ADULT support person, who may not rotate, to accompany patient in pre-op.  Children under the age of 69 must have an adult with them who is not the patient and must remain in the main waiting area with an adult.  If the patient needs to stay at the hospital during part of their recovery, the visitor guidelines for inpatient rooms apply.  Please refer to the [facility] website for the visitor guidelines for any additional information.   If you received a COVID test during your pre-op visit  it is requested that you wear a mask when out in public, stay away from anyone that may not be feeling well and notify your surgeon if you develop symptoms. If you have been in contact with anyone that has tested positive in the last 10 days please notify you surgeon.      Pre-operative CHG Bathing Instructions   You can play a key role in reducing the risk of infection after surgery. Your skin needs to be as free of germs as  possible. You can reduce the number of germs on your skin by washing with CHG (chlorhexidine gluconate) soap before surgery. CHG is an antiseptic soap that kills germs and continues to kill germs even after washing.   DO NOT use if you have an allergy to chlorhexidine/CHG or antibacterial soaps. If your skin becomes reddened or irritated, stop using the CHG and notify one of our RNs at [phone].              TAKE A SHOWER THE NIGHT BEFORE SURGERY AND THE DAY OF SURGERY    Please keep in mind the following:  DO NOT shave, including legs and underarms, 48 hours prior to surgery.   You may shave your face before/day of surgery.  Place clean sheets on your bed the night before surgery Use a clean washcloth (not used since being washed) for each shower.  DO NOT sleep with pet's night before surgery.  CHG Shower Instructions:  Wash your face and private area with normal soap. If you choose to wash your hair, wash first with your normal shampoo.  After you use shampoo/soap, rinse your hair and body thoroughly to remove shampoo/soap residue.  Turn the water OFF and apply half the bottle of CHG soap to a CLEAN washcloth.  Apply CHG soap ONLY FROM YOUR NECK DOWN TO YOUR TOES (washing for 3-5 minutes)  DO NOT use CHG soap on face, private areas, open wounds, or sores.  Pay special attention to the area where your surgery is being performed.  If you are having back surgery, having someone wash your back for you may be helpful. Wait 2 minutes after CHG soap is applied, then you may rinse off the CHG soap.  Pat dry with a clean towel  Put on clean pajamas    Additional instructions for the day of surgery: DO NOT APPLY any lotions, deodorants or perfumes.   Do not wear jewelry or makeup Do not wear nail polish, gel polish, artificial nails, or any other type of covering on natural nails (fingers and toes) Do not bring valuables to the hospital. [facility] is not responsible for valuables/personal  belongings. Put on clean/comfortable clothes.  Please brush your teeth.  Ask your nurse before applying any prescription medications to the skin.

## 2022-12-11 ENCOUNTER — Encounter (HOSPITAL_COMMUNITY): Payer: Self-pay

## 2022-12-11 ENCOUNTER — Ambulatory Visit: Payer: Medicare HMO | Admitting: Cardiology

## 2022-12-11 ENCOUNTER — Ambulatory Visit (HOSPITAL_COMMUNITY)
Admission: RE | Admit: 2022-12-11 | Discharge: 2022-12-11 | Disposition: A | Discharge status: Home / Self Care | Payer: Medicare HMO | Source: Ambulatory Visit | Attending: Surgery | Admitting: Surgery

## 2022-12-11 ENCOUNTER — Other Ambulatory Visit: Payer: Self-pay

## 2022-12-11 ENCOUNTER — Other Ambulatory Visit: Payer: Medicare HMO

## 2022-12-11 ENCOUNTER — Encounter (HOSPITAL_COMMUNITY)
Admission: RE | Admit: 2022-12-11 | Discharge: 2022-12-11 | Disposition: A | Discharge status: Home / Self Care | Payer: Medicare HMO | Source: Ambulatory Visit | Attending: Surgery | Admitting: Surgery

## 2022-12-11 VITALS — BP 144/68 | HR 81 | Temp 97.7°F | Resp 18 | Ht 65.0 in | Wt 164.0 lb

## 2022-12-11 DIAGNOSIS — E119 Type 2 diabetes mellitus without complications: Secondary | ICD-10-CM | POA: Insufficient documentation

## 2022-12-11 DIAGNOSIS — Z87891 Personal history of nicotine dependence: Secondary | ICD-10-CM | POA: Insufficient documentation

## 2022-12-11 DIAGNOSIS — Z1152 Encounter for screening for COVID-19: Secondary | ICD-10-CM | POA: Insufficient documentation

## 2022-12-11 DIAGNOSIS — I2510 Atherosclerotic heart disease of native coronary artery without angina pectoris: Secondary | ICD-10-CM | POA: Insufficient documentation

## 2022-12-11 DIAGNOSIS — I10 Essential (primary) hypertension: Secondary | ICD-10-CM | POA: Insufficient documentation

## 2022-12-11 DIAGNOSIS — Z01818 Encounter for other preprocedural examination: Secondary | ICD-10-CM | POA: Insufficient documentation

## 2022-12-11 DIAGNOSIS — E785 Hyperlipidemia, unspecified: Secondary | ICD-10-CM | POA: Insufficient documentation

## 2022-12-11 HISTORY — DX: Depression, unspecified: F32.A

## 2022-12-11 HISTORY — DX: Gastro-esophageal reflux disease without esophagitis: K21.9

## 2022-12-11 HISTORY — DX: Headache, unspecified: R51.9

## 2022-12-11 HISTORY — DX: Nontoxic multinodular goiter: E04.2

## 2022-12-11 LAB — CBC
HCT: 38.6 % (ref 36.0–46.0)
Hemoglobin: 11.9 g/dL — ABNORMAL LOW (ref 12.0–15.0)
MCH: 27.4 pg (ref 26.0–34.0)
MCHC: 30.8 g/dL (ref 30.0–36.0)
MCV: 88.9 fL (ref 80.0–100.0)
Platelets: 287 10*3/uL (ref 150–400)
RBC: 4.34 MIL/uL (ref 3.87–5.11)
RDW: 13.2 % (ref 11.5–15.5)
WBC: 11 10*3/uL — ABNORMAL HIGH (ref 4.0–10.5)
nRBC: 0 % (ref 0.0–0.2)

## 2022-12-11 LAB — URINALYSIS, ROUTINE W REFLEX MICROSCOPIC
Bacteria, UA: NONE SEEN
Bilirubin Urine: NEGATIVE
Glucose, UA: >=500 mg/dL — AB
Hgb urine dipstick: NEGATIVE
Ketones, ur: NEGATIVE mg/dL
Leukocytes,Ua: NEGATIVE
Nitrite: NEGATIVE
Protein, ur: 30 mg/dL — AB
Specific Gravity, Urine: 1.032 — ABNORMAL HIGH (ref 1.005–1.030)
pH: 5 (ref 5.0–8.0)

## 2022-12-11 LAB — COMPREHENSIVE METABOLIC PANEL
ALT: 10 U/L (ref 0–44)
AST: 21 U/L (ref 15–41)
Albumin: 3.5 g/dL (ref 3.5–5.0)
Alkaline Phosphatase: 117 U/L (ref 38–126)
Anion gap: 11 (ref 5–15)
BUN: 8 mg/dL (ref 8–23)
CO2: 21 mmol/L — ABNORMAL LOW (ref 22–32)
Calcium: 9.1 mg/dL (ref 8.9–10.3)
Chloride: 109 mmol/L (ref 98–111)
Creatinine, Ser: 0.76 mg/dL (ref 0.44–1.00)
GFR, Estimated: >60 mL/min (ref >60)
Glucose, Bld: 101 mg/dL — ABNORMAL HIGH (ref 70–99)
Potassium: 3.6 mmol/L (ref 3.5–5.1)
Sodium: 141 mmol/L (ref 135–145)
Total Bilirubin: 0.4 mg/dL (ref 0.3–1.2)
Total Protein: 7 g/dL (ref 6.5–8.1)

## 2022-12-11 LAB — PROTIME-INR
INR: 0.9 (ref 0.8–1.2)
Prothrombin Time: 12.7 s (ref 11.4–15.2)

## 2022-12-11 LAB — GLUCOSE, CAPILLARY: Glucose-Capillary: 107 mg/dL — ABNORMAL HIGH (ref 70–99)

## 2022-12-11 LAB — HEMOGLOBIN A1C
Hgb A1c MFr Bld: 8.2 % — ABNORMAL HIGH (ref 4.8–5.6)
Mean Plasma Glucose: 188.64 mg/dL

## 2022-12-11 LAB — BLOOD GAS, ARTERIAL
Acid-base deficit: 3.8 mmol/L — ABNORMAL HIGH (ref 0.0–2.0)
Bicarbonate: 21.5 mmol/L (ref 20.0–28.0)
O2 Saturation: 99.9 %
Patient temperature: 37
pCO2 arterial: 39 mm[Hg] (ref 32–48)
pH, Arterial: 7.35 (ref 7.35–7.45)
pO2, Arterial: 101 mm[Hg] (ref 83–108)

## 2022-12-11 LAB — APTT: aPTT: 24 s (ref 24–36)

## 2022-12-11 LAB — SURGICAL PCR SCREEN
MRSA, PCR: NEGATIVE
Staphylococcus aureus: NEGATIVE

## 2022-12-11 NOTE — Progress Notes (Signed)
PCP - Dr. Darreld Mclean Cardiologist - Dr. Debbe Odea  PPM/ICD - denies   Chest x-ray - 12/11/22 EKG - 11/27/22 Stress Test - 10/09/22 ECHO - 11/27/22 Cardiac Cath - 11/27/22  Sleep Study - denies   Fasting Blood Sugar - 120-140 Checks Blood Sugar 2-3 times a day  Last dose of GLP1 agonist-  n/a PT FORGOT INSTRUCTIONS AND TOOK HER JARDIANCE TODAY (10/1). Darius Bump, RN aware.  Blood Thinner Instructions: n/a Aspirin Instructions: Hold DOS  ERAS Protcol - no, NPO    COVID TEST- 12/11/22   Anesthesia review: yes, cardiac hx  Patient denies shortness of breath, fever, cough and chest pain at PAT appointment   All instructions explained to the patient, with a verbal understanding of the material. Patient agrees to go over the instructions while at home for a better understanding. Patient also instructed to wear a mask in public after being tested for COVID-19. The opportunity to ask questions was provided.

## 2022-12-11 NOTE — Progress Notes (Signed)
Surgical Instructions   Your procedure is scheduled on Thursday December 13, 2022. Report to [facility] Main Entrance "A" at 5:30 A.M., then check in with the Admitting office. Any questions or running late day of surgery: call [phone]  Questions prior to your surgery date: call [phone], Monday-Friday, 8am-4pm. If you experience any cold or flu symptoms such as cough, fever, chills, shortness of breath, etc. between now and your scheduled surgery, please notify us at the above number.     Remember:  Do not eat or drink after midnight the night before your surgery  Take these medicines the morning of surgery with A SIP OF WATER  amLODipine (NORVASC)  atorvastatin (LIPITOR)  cyclobenzaprine (FLEXERIL)  isosorbide mononitrate (IMDUR) methimazole (TAPAZOLE)   omeprazole (PRILOSEC)  PARoxetine (PAXIL)  predniSONE (DELTASONE)  pregabalin (LYRICA)  sertraline (ZOLOFT)  SPIRIVA HANDIHALER 18 MCG inhalation capsule  tiZANidine (ZANAFLEX)    May take these medicines IF NEEDED: acetaminophen (TYLENOL)  HYDROcodone-acetaminophen (NORCO/VICODIN)  mometasone (NASONEX)  nitroGLYCERIN (NITROSTAT)- if you have to take this medication prior to surgery, please call one of the above phone numbers and report this to a Nurse  VENTOLIN HFA. Please bring inhaler with you to the hospital      One week prior to surgery, STOP taking any Aleve, Naproxen, Ibuprofen, Motrin, Advil, Goody's, BC's, all herbal medications, fish oil, and non-prescription vitamins.  This includes your diclofenac Sodium (VOLTAREN) 1 % GEL.     WHAT DO I DO ABOUT MY DIABETES MEDICATION?  Do not take oral diabetes medicines (pills) the morning of surgery.         Per your surgeon, STOPmetFORMIN (GLUCOPHAGE) 2 days prior to surgery, with the last dose being 12/10/2022.         Per your surgeon, STOP JARDIANCE 3 days prior to surgery, with the last dose being 12/09/2022.   THE NIGHT BEFORE SURGERY, take 30 units of  insulin degludec (TRESIBA FLEXTOUCH).  This 50% of your regular dose.          HOW TO MANAGE YOUR DIABETES BEFORE AND AFTER SURGERY  Why is it important to control my blood sugar before and after surgery? Improving blood sugar levels before and after surgery helps healing and can limit problems. A way of improving blood sugar control is eating a healthy diet by:  Eating less sugar and carbohydrates  Increasing activity/exercise  Talking with your doctor about reaching your blood sugar goals High blood sugars (greater than 180 mg/dL) can raise your risk of infections and slow your recovery, so you will need to focus on controlling your diabetes during the weeks before surgery. Make sure that the doctor who takes care of your diabetes knows about your planned surgery including the date and location.  How do I manage my blood sugar before surgery? Check your blood sugar at least 4 times a day, starting 2 days before surgery, to make sure that the level is not too high or low.  Check your blood sugar the morning of your surgery when you wake up and every 2 hours until you get to the Short Stay unit.  If your blood sugar is less than 70 mg/dL, you will need to treat for low blood sugar: Do not take insulin. Treat a low blood sugar (less than 70 mg/dL) with  cup of clear juice (cranberry or apple), 4 glucose tablets, OR glucose gel. Recheck blood sugar in 15 minutes after treatment (to make sure it is greater than 70 mg/dL). If  your blood sugar is not greater than 70 mg/dL on recheck, call [phone] for further instructions. Report your blood sugar to the short stay nurse when you get to Short Stay.  If you are admitted to the hospital after surgery: Your blood sugar will be checked by the staff and you will probably be given insulin after surgery (instead of oral diabetes medicines) to make sure you have good blood sugar levels. The goal for blood sugar control after surgery is 80-180  mg/dL.                      Do NOT Smoke (Tobacco/Vaping) for 24 hours prior to your procedure.  If you use a CPAP at night, you may bring your mask/headgear for your overnight stay.   You will be asked to remove any contacts, glasses, piercing's, hearing aid's, dentures/partials prior to surgery. Please bring cases for these items if needed.    Patients discharged the day of surgery will not be allowed to drive home, and someone needs to stay with them for 24 hours.  SURGICAL WAITING ROOM VISITATION Patients may have no more than 2 support people in the waiting area - these visitors may rotate.   Pre-op nurse will coordinate an appropriate time for 1 ADULT support person, who may not rotate, to accompany patient in pre-op.  Children under the age of 9 must have an adult with them who is not the patient and must remain in the main waiting area with an adult.  If the patient needs to stay at the hospital during part of their recovery, the visitor guidelines for inpatient rooms apply.  Please refer to the [facility] website for the visitor guidelines for any additional information.   If you received a COVID test during your pre-op visit  it is requested that you wear a mask when out in public, stay away from anyone that may not be feeling well and notify your surgeon if you develop symptoms. If you have been in contact with anyone that has tested positive in the last 10 days please notify you surgeon.      Pre-operative CHG Bathing Instructions   You can play a key role in reducing the risk of infection after surgery. Your skin needs to be as free of germs as possible. You can reduce the number of germs on your skin by washing with CHG (chlorhexidine gluconate) soap before surgery. CHG is an antiseptic soap that kills germs and continues to kill germs even after washing.   DO NOT use if you have an allergy to chlorhexidine/CHG or antibacterial soaps. If your skin becomes reddened or  irritated, stop using the CHG and notify one of our RNs at [phone].              TAKE A SHOWER THE NIGHT BEFORE SURGERY AND THE DAY OF SURGERY    Please keep in mind the following:  DO NOT shave, including legs and underarms, 48 hours prior to surgery.   You may shave your face before/day of surgery.  Place clean sheets on your bed the night before surgery Use a clean washcloth (not used since being washed) for each shower. DO NOT sleep with pet's night before surgery.  CHG Shower Instructions:  Wash your face and private area with normal soap. If you choose to wash your hair, wash first with your normal shampoo.  After you use shampoo/soap, rinse your hair and body thoroughly to remove shampoo/soap residue.  Turn the water OFF and apply half the bottle of CHG soap to a CLEAN washcloth.  Apply CHG soap ONLY FROM YOUR NECK DOWN TO YOUR TOES (washing for 3-5 minutes)  DO NOT use CHG soap on face, private areas, open wounds, or sores.  Pay special attention to the area where your surgery is being performed.  If you are having back surgery, having someone wash your back for you may be helpful. Wait 2 minutes after CHG soap is applied, then you may rinse off the CHG soap.  Pat dry with a clean towel  Put on clean pajamas    Additional instructions for the day of surgery: DO NOT APPLY any lotions, deodorants or perfumes.   Do not wear jewelry or makeup Do not wear nail polish, gel polish, artificial nails, or any other type of covering on natural nails (fingers and toes) Do not bring valuables to the hospital. [facility] is not responsible for valuables/personal belongings. Put on clean/comfortable clothes.  Please brush your teeth.  Ask your nurse before applying any prescription medications to the skin.

## 2022-12-12 MED ORDER — HEPARIN 30,000 UNITS/1000 ML (OHS) CELLSAVER SOLUTION
Status: DC
  Filled 2022-12-12: qty 1000

## 2022-12-12 MED ORDER — INSULIN REGULAR(HUMAN) IN NACL 100-0.9 UT/100ML-% IV SOLN
INTRAVENOUS | Status: DC
  Filled 2022-12-12: qty 100

## 2022-12-12 MED ORDER — MAGNESIUM SULFATE 50 % IJ SOLN
40.0000 meq | INTRAMUSCULAR | Status: DC
  Filled 2022-12-12: qty 9.85

## 2022-12-12 MED ORDER — CEFAZOLIN SODIUM-DEXTROSE 2-4 GM/100ML-% IV SOLN
2.0000 g | INTRAVENOUS | Status: AC
  Administered 2022-12-13 (×2): 2 g via INTRAVENOUS
  Filled 2022-12-12: qty 100

## 2022-12-12 MED ORDER — VANCOMYCIN HCL 1250 MG/250ML IV SOLN
1250.0000 mg | INTRAVENOUS | Status: AC
  Administered 2022-12-13: 1250 mg via INTRAVENOUS
  Filled 2022-12-12: qty 250

## 2022-12-12 MED ORDER — DEXMEDETOMIDINE HCL IN NACL 400 MCG/100ML IV SOLN
0.1000 ug/kg/h | INTRAVENOUS | Status: AC
  Administered 2022-12-13: .3 ug/kg/h via INTRAVENOUS
  Filled 2022-12-12: qty 100

## 2022-12-12 MED ORDER — PHENYLEPHRINE HCL-NACL 20-0.9 MG/250ML-% IV SOLN
30.0000 ug/min | INTRAVENOUS | Status: AC
  Administered 2022-12-13: 5 ug/min via INTRAVENOUS
  Filled 2022-12-12: qty 250

## 2022-12-12 MED ORDER — EPINEPHRINE HCL 5 MG/250ML IV SOLN IN NS
0.0000 ug/min | INTRAVENOUS | Status: DC
  Filled 2022-12-12: qty 250

## 2022-12-12 MED ORDER — POTASSIUM CHLORIDE 2 MEQ/ML IV SOLN
80.0000 meq | INTRAVENOUS | Status: DC
  Filled 2022-12-12: qty 40

## 2022-12-12 MED ORDER — CEFAZOLIN SODIUM-DEXTROSE 2-4 GM/100ML-% IV SOLN
2.0000 g | INTRAVENOUS | Status: DC
  Filled 2022-12-12: qty 100

## 2022-12-12 MED ORDER — PLASMA-LYTE A IV SOLN
INTRAVENOUS | Status: DC
  Filled 2022-12-12: qty 2.5

## 2022-12-12 MED ORDER — MILRINONE LACTATE IN DEXTROSE 20-5 MG/100ML-% IV SOLN
0.3000 ug/kg/min | INTRAVENOUS | Status: DC
  Filled 2022-12-12: qty 100

## 2022-12-12 MED ORDER — TRANEXAMIC ACID 1000 MG/10ML IV SOLN
1.5000 mg/kg/h | INTRAVENOUS | Status: AC
  Administered 2022-12-13: 1.5 mg/kg/h via INTRAVENOUS
  Filled 2022-12-12: qty 25

## 2022-12-12 MED ORDER — NITROGLYCERIN IN D5W 200-5 MCG/ML-% IV SOLN
2.0000 ug/min | INTRAVENOUS | Status: AC
  Administered 2022-12-13: 10 ug/min via INTRAVENOUS
  Filled 2022-12-12: qty 250

## 2022-12-12 MED ORDER — TRANEXAMIC ACID (OHS) PUMP PRIME SOLUTION
2.0000 mg/kg | INTRAVENOUS | Status: DC
  Filled 2022-12-12: qty 1.49

## 2022-12-12 MED ORDER — TRANEXAMIC ACID (OHS) BOLUS VIA INFUSION
15.0000 mg/kg | INTRAVENOUS | Status: AC
  Administered 2022-12-13: 1116 mg via INTRAVENOUS
  Filled 2022-12-12: qty 1116

## 2022-12-12 MED ORDER — NOREPINEPHRINE 4 MG/250ML-% IV SOLN
0.0000 ug/min | INTRAVENOUS | Status: DC
  Filled 2022-12-12: qty 250

## 2022-12-12 NOTE — H&P (Signed)
[address]       [name] [address]             [phone]      Cardiothoracic Surgery Admission History and Physical   PCP is [name], MD Referring Provider is End, Cristal Deer, MD       Chief Complaint  Patient presents with   Coronary Artery Disease          HPI: The patient is a 72 year old woman with history of type 2 diabetes, hypertension, hyperlipidemia, previous smoking, and coronary artery disease who was referred for consideration of coronary bypass surgery.  She had a Lexiscan Myoview study in 02/2021 which showed no significant ischemia.  A subsequent study on 10/09/2022 showed evidence of infarction and ischemia.  Left ventricular function was normal.  It was felt to be a very suboptimal study due to intense GI uptake at the border of the inferior wall.  There was felt that the anterior wall reversible defect may be due to artifact.  She subsequently underwent cardiac catheterization on 11/27/2022 showing severe three-vessel coronary disease with mildly elevated right heart and pulm artery pressures.  There was 70% mid LAD stenosis that was severely calcified.  There were 70% and 90% mid left circumflex stenoses.  The right coronary artery had moderate 40 to 50% mid vessel stenoses.  There is 80% distal stenosis before the posterior descending branch.  Echocardiogram on 11/27/2022 showed an ejection fraction of 55 to 60% with grade 1 diastolic dysfunction.  There is mild mitral regurgitation.   She reports having episodes of chest tightness and shortness of breath and fatigue have been occurring with exertion as well as occasionally at rest while sleeping.  She has been on Imdur.       Past Medical History:  Diagnosis Date   Arthritis     Asthma     Coronary artery calcification      Noted on prior CTA chest (01/2021)   Diabetes mellitus without complication (HCC)     Former tobacco use     Graves' disease     Hepatitis 1995    Hep C.   Treated.   Hyperlipidemia     Hypertension     Precordial chest pain      MV 02/2021 low risk, no significant ischemia               Past Surgical History:  Procedure Laterality Date   ABDOMINAL HYSTERECTOMY       EYE SURGERY       RIGHT/LEFT HEART CATH AND CORONARY ANGIOGRAPHY Bilateral 11/27/2022    Procedure: RIGHT/LEFT HEART CATH AND CORONARY ANGIOGRAPHY;  Surgeon: Yvonne Kendall, MD;  Location: [facility] INV[facility]IVE [facility];  Service: Cardiovascular;  Laterality: Bilateral;               Family History  Problem Relation Age of Onset   Breast cancer Neg Hx            Social History Social History  Social History         Tobacco Use   Smoking status: Former      Current packs/day: 0.00      Average packs/day: 0.5 packs/day for 33.0 years (16.5 ttl pk-yrs)      Types: Cigarettes      Start date: 08/11/1986      Quit date: 08/11/2019      Years since quitting: 3.3   Smokeless tobacco: Never  Vaping  Use   Vaping status: Never Used  Substance Use Topics   Alcohol use: No   Drug use: No              Current Outpatient Medications  Medication Sig Dispense Refill   acetaminophen (TYLENOL) 650 MG CR tablet Take 1,300 mg by mouth 2 (two) times daily as needed for pain.       ACETAMINOPHEN 8 HOUR 650 MG CR tablet Take 650 mg by mouth every 8 (eight) hours as needed for pain.       amLODipine (NORVASC) 10 MG tablet Take 1 tablet (10 mg total) by mouth daily. 30 tablet 0   Artificial Saliva (BIOTENE MOISTURIZING MOUTH MT) Use as directed 1-2 sprays in the mouth or throat 4 (four) times daily as needed.       ASPERCREME LIDOCAINE 4 % CREA Apply 1 application topically 2 (two) times daily. Apply to back and knees prn for pain       ASPIRIN 81 PO Take by mouth daily.       atorvastatin (LIPITOR) 80 MG tablet Take 1 tablet (80 mg total) by mouth daily. 30 tablet 0   calcium carbonate (TUMS - DOSED IN MG ELEMENTAL CALCIUM) 500 MG chewable tablet Chew 2 tablets by mouth 2 (two) times  daily as needed for indigestion or heartburn.       camphor-menthol (SARNA) lotion Apply 1 application topically as needed for itching.       chlorhexidine (PERIDEX) 0.12 % solution Use as directed 15 mLs in the mouth or throat 2 (two) times daily as needed. Swish for 30 seconds then spit       Cholecalciferol 25 MCG (1000 UT) capsule Take 1,000 Units by mouth daily.       clobetasol cream (TEMOVATE) 0.05 % Apply 1 application topically 2 (two) times a week. Apply to vulva       clotrimazole (LOTRIMIN) 1 % cream Apply 1 application topically 2 (two) times daily.       cyclobenzaprine (FLEXERIL) 10 MG tablet Take 10 mg by mouth 3 (three) times daily.       diclofenac Sodium (VOLTAREN) 1 % GEL Apply 1 Application topically 4 (four) times daily.       ferrous sulfate 325 (65 FE) MG tablet Take 1 tablet (325 mg total) by mouth daily. 90 tablet 0   folic acid (FOLVITE) 1 MG tablet Take 1 mg by mouth daily.       HYDROcodone-acetaminophen (NORCO/VICODIN) 5-325 MG tablet Take 1 tablet by mouth as needed.       ibuprofen (ADVIL) 800 MG tablet Take 800 mg by mouth 3 (three) times daily.       insulin degludec (TRESIBA FLEXTOUCH) 200 UNIT/ML FlexTouch Pen Inject 60 Units into the skin at bedtime.       isosorbide mononitrate (IMDUR) 30 MG 24 hr tablet Take 1 tablet (30 mg total) by mouth daily.       JARDIANCE 25 MG TABS tablet Take 25 mg by mouth daily.       Lancets (ONETOUCH DELICA PLUS LANCET33G) MISC Apply topically.       metFORMIN (GLUCOPHAGE) 500 MG tablet Take 1,000 mg by mouth 2 (two) times daily with a meal.       methimazole (TAPAZOLE) 5 MG tablet Take 2.5 mg by mouth daily.       mometasone (NASONEX) 50 MCG/ACT nasal spray Place 2 sprays into the nose daily as needed.  nitroGLYCERIN (NITROSTAT) 0.4 MG SL tablet Place 0.4 mg under the tongue every 5 (five) minutes as needed for chest pain.       omeprazole (PRILOSEC) 40 MG capsule Take 1 capsule by mouth daily.       ONETOUCH ULTRA test  strip USE AS DIRECTED FOR BLOOD SUGAR TESTING THREE TIMES DAILY FOR DIABETES E11.9       PARoxetine (PAXIL) 40 MG tablet Take 1 tablet (40 mg total) by mouth every morning. 30 tablet 0   predniSONE (DELTASONE) 20 MG tablet Take 20 mg by mouth daily.       pregabalin (LYRICA) 100 MG capsule Take 100 mg by mouth 2 (two) times daily.       pregabalin (LYRICA) 100 MG capsule Take 1 capsule (100 mg total) by mouth 2 (two) times daily. 60 capsule 0   sertraline (ZOLOFT) 25 MG tablet Take 25 mg by mouth daily.       SPIRIVA HANDIHALER 18 MCG inhalation capsule Place 1 capsule into inhaler and inhale daily.       tiZANidine (ZANAFLEX) 2 MG tablet Take 2 mg by mouth 3 (three) times daily.       traMADol (ULTRAM) 50 MG tablet Take 50 mg by mouth at bedtime.       traZODone (DESYREL) 50 MG tablet Take 50 mg by mouth at bedtime.       triamcinolone ointment (KENALOG) 0.1 % SMARTSIG:1 Application Topical 2-3 Times Daily       Vaginal Lubricant (REPLENS) GEL Place 1 application vaginally 3 (three) times daily as needed. Apply to vagina and vulva as needed for itching. Use on days that you do not use clobetasol       VENTOLIN HFA 108 (90 Base) MCG/ACT inhaler Inhale 2 puffs into the lungs every 6 (six) hours as needed.          No current facility-administered medications for this visit.        Allergies       Allergies  Allergen Reactions   Ace Inhibitors Cough      Chronic cough for 3 months. Trial off lisinopril starting 05/11/2011 effective in stopping cough.   Fluticasone Other (See Comments)      choking        Review of Systems  Constitutional:  Positive for activity change and fatigue.  HENT: Negative.    Eyes: Negative.   Respiratory:  Positive for shortness of breath.   Cardiovascular:  Positive for chest pain. Negative for leg swelling.  Gastrointestinal: Negative.   Endocrine: Negative.   Genitourinary: Negative.   Musculoskeletal:  Positive for arthralgias.  Skin: Negative.    Allergic/Immunologic: Negative.   Neurological:  Negative for dizziness and syncope.  Hematological: Negative.   Psychiatric/Behavioral: Negative.        BP (!) 141/79 (BP Location: Left Arm, Patient Position: Sitting)   Pulse 92   Resp 18   Ht 5\' 5"  (1.651 m)   Wt 164 lb (74.4 kg)   SpO2 98% Comment: RA  BMI 27.29 kg/m  Physical Exam Constitutional:      Appearance: Normal appearance.  HENT:     Head: Normocephalic and atraumatic.  Eyes:     Extraocular Movements: Extraocular movements intact.     Pupils: Pupils are equal, round, and reactive to light.  Neck:     Vascular: No carotid bruit.  Cardiovascular:     Rate and Rhythm: Normal rate and regular rhythm.     Heart sounds: No  murmur heard. Pulmonary:     Effort: Pulmonary effort is normal.     Breath sounds: Normal breath sounds.  Abdominal:     General: There is no distension.     Tenderness: There is no abdominal tenderness.  Musculoskeletal:        General: No swelling.     Cervical back: Normal range of motion.  Skin:    General: Skin is warm and dry.  Neurological:     General: No focal deficit present.     Mental Status: She is alert and oriented to person, place, and time.  Psychiatric:        Mood and Affect: Mood normal.        Behavior: Behavior normal.          Diagnostic Tests:   Physicians   Panel Physicians Referring Physician Case Authorizing Physician  End, Christopher, MD (Primary)        Procedures   RIGHT/LEFT HEART CATH AND CORONARY ANGIOGRAPHY    Conclusion   Conclusions: Severe three-vessel coronary artery disease, as detailed below. Normal left heart filling pressures. Mildly elevated right heart and pulmonary artery pressures. Normal Fick cardiac output/index.   Recommendations: Refer to cardiac surgery for CABG evaluation in the setting of three-vessel coronary artery disease and diabetes mellitus. Obtain echocardiogram prior to discharge today. Increase isosorbide  mononitrate to 30 mg daily for additional antianginal therapy. Aggressive secondary prevention of coronary artery disease.   Yvonne Kendall, MD [facility]     Recommendations       Antiplatelet/Anticoag Continue aspirin 81 mg daily pending cardiac surgery evaluation.  Discharge Date In the absence of any other complications or medical issues, we expect the patient to be ready for discharge from a cath perspective on 11/27/2022.    Indications   Shortness of breath [R06.02 (ICD-10-CM)]  Chest pain of uncertain etiology [R07.9 (ICD-10-CM)]  Abnormal stress test [R94.39 (ICD-10-CM)]    Clinical Presentation   CHF/Shock Congestive heart failure present. NYHA Class III. No shock present.    Procedural Details   Technical Details Indication: 72 y.o. year-old woman with history of  coronary artery calcification on chest CT, hypertension, hyperlipidemia, type 2 diabetes, former smoker x 20+ years, referred for evaluation of abnormal stress test in the setting of progressive chest pain and shortness of breath.  GFR: >60 ml/min  Procedure: The risks, benefits, complications, treatment options, and expected outcomes were discussed with the patient. The patient and/or family concurred with the proposed plan, giving informed consent. The right wrist and elbow were prepped and draped in a sterile fashion. 1% lidocaine was used for local anesthesia. A previously placed antecubital vein IV was exchanged for a 43F slender Glidesheath using modified Seldinger technique. Right heart catheterization was performed by advancing a 43F balloon-tipped catheter through the right heart chambers into the pulmonary capillary wedge position. Pressure measurements and oxygen saturations were obtained.  Using the modified Seldinger access technique, a 37F slender Glidesheath was placed in the right radial artery. 3 mg Verapamil was given through the sheath. Heparin 3,500 units were administered. Selective coronary  angiography was performed using a 43F JL3.5 catheter to engage the left coronary artery and a 43F JR4 catheter to engage the right coronary artery. Left heart catheterization was performed using a 43F JR4 catheter. Left ventriculogram was performed with a power injection of contrast.  At the end of the procedure, the radial artery sheath was removed and a TR band applied to achieve patent hemostasis. The antecubital  vein sheath was removed and manual compression applied to achieve patent hemostasis. There were no immediate complications. The patient was taken to the recovery area in stable condition.   Estimated blood loss <50 mL.   During this procedure medications were administered to achieve and maintain moderate conscious sedation while the patient's heart rate, blood pressure, and oxygen saturation were continuously monitored and I was present face-to-face 100% of this time.    Medications (Filter: Administrations occurring from 0948 to 1052 on 11/27/22)  important  Continuous medications are totaled by the amount administered until 11/27/22 1052.    midazolam (VERSED) injection (mg)  Total dose: 1 mg Date/Time Rate/Dose/Volume Action    11/27/22 1002 1 mg Given    fentaNYL (SUBLIMAZE) injection (mcg)  Total dose: 25 mcg Date/Time Rate/Dose/Volume Action    11/27/22 1002 25 mcg Given    lidocaine (PF) (XYLOCAINE) 1 % injection (mL)  Total volume: 4 mL Date/Time Rate/Dose/Volume Action    11/27/22 1009 2 mL Given    1014 2 mL Given    Heparin (Porcine) in NaCl 2000-0.9 UNIT/L-% SOLN (mL)  Total volume: 1,000 mL Date/Time Rate/Dose/Volume Action    11/27/22 1014 1,000 mL Given    verapamil (ISOPTIN) injection (mg)  Total dose: 5 mg Date/Time Rate/Dose/Volume Action    11/27/22 1017 2.5 mg Given    1035 2.5 mg Given    heparin sodium (porcine) injection (Units)  Total dose: 3,500 Units Date/Time Rate/Dose/Volume Action    11/27/22 1020 3,500 Units Given    iohexol (OMNIPAQUE)  300 MG/ML solution (mL)  Total volume: 37 mL Date/Time Rate/Dose/Volume Action    11/27/22 1047 37 mL Given      Sedation Time   Sedation Time Physician-1: 33 minutes 25 seconds Contrast        Administrations occurring from 0948 to 1052 on 11/27/22:  Medication Name Total Dose  iohexol (OMNIPAQUE) 300 MG/ML solution 37 mL    Radiation/Fluoro   Fluoro time: 6.3 (min) DAP: 22.7 (Gycm2) Cumulative Air Kerma: 305 (mGy) Complications   Complications documented before study signed (11/28/2022  7:19 AM)    No complications were associated with this study.  Documented by Santiago Bur - 11/27/2022 10:46 AM      Coronary Findings   Diagnostic Dominance: Right Left Main  Vessel is large. Vessel is angiographically normal.    Left Anterior Descending  Vessel is moderate in size.  Mid LAD lesion is 70% stenosed. The lesion is severely calcified.    First Diagonal Branch  Vessel is small in size.    Second Diagonal Branch  Vessel is moderate in size.    Third Diagonal Branch  Vessel is small in size.    Left Circumflex  Vessel is moderate in size.  Mid Cx lesion is 70% stenosed.  Mid Cx to Dist Cx lesion is 90% stenosed.    First Obtuse Marginal Branch  Vessel is moderate in size.    Second Obtuse Marginal Branch  Vessel is small in size.    Third Obtuse Marginal Branch  Vessel is small in size.    Right Coronary Artery  Vessel is moderate in size. The vessel is moderately tortuous.  Mid RCA-1 lesion is 40% stenosed.  Mid RCA-2 lesion is 50% stenosed.  Dist RCA lesion is 80% stenosed.    Right Posterior Descending Artery  Vessel is small in size.    Right Posterior Atrioventricular Artery  Vessel is small in size.    First Right Posterolateral Branch  Vessel is small in size.    Second Right Posterolateral Branch  Vessel is small in size.    Intervention    No interventions have been documented.    Right Heart   Right Heart Pressures RA  (mean): 7 mmHg RV (S/EDP): 35/8 mmHg PA (S/D, mean): 35/15 (22) mmHg PCWP (mean): 11 mmHg  Ao sat: 92% PA sat: 60%  Fick CO: 4.8 L/min Fick CI: 2.6 L/min/m^2    Wall Motion       The following segments are normal: mid anterior, mid inferior, basilar anterior, basilar inferior, apical anterior and apical inferior.             Left Heart   Left Ventricle The left ventricular size is normal. The left ventricular systolic function is normal. LV end diastolic pressure is normal. LVEDP 11 mmHg. The left ventricular ejection fraction is 55-65% by visual estimate.  Aortic Valve There is no aortic valve stenosis.    Coronary Diagrams   Diagnostic Dominance: Right  Intervention    Implants    No implant documentation for this case.    Syngo Images    Show images for CARDIAC CATHETERIZATION Images on Long Term Storage    Show images for Erica Estrada, Erica Estrada to Procedure Log   Procedure Log    Link to Procedure Log   Procedure Log    Hemo Data   Flowsheet Row Most Recent Value  Fick Cardiac Output 4.77 L/min  Fick Cardiac Output Index 2.63 (L/min)/BSA  RA A Wave 7 mmHg  RA V Wave 6 mmHg  RA Mean 3 mmHg  RV Systolic Pressure 34 mmHg  RV Diastolic Pressure 3 mmHg  RV EDP 9 mmHg  PA Systolic Pressure 34 mmHg  PA Diastolic Pressure 12 mmHg  PA Mean 22 mmHg  PW A Wave 10 mmHg  PW V Wave 9 mmHg  PW Mean 8 mmHg  AO Systolic Pressure 134 mmHg  AO Diastolic Pressure 64 mmHg  AO Mean 94 mmHg  LV Systolic Pressure 141 mmHg  LV Diastolic Pressure -2 mmHg  LV EDP 13 mmHg  AOp Systolic Pressure 135 mmHg  AOp Diastolic Pressure 64 mmHg  AOp Mean Pressure 96 mmHg  LVp Systolic Pressure 141 mmHg  LVp Diastolic Pressure 8 mmHg  LVp EDP Pressure 16 mmHg  QP/QS 1  TPVR Index 8.35 HRUI  TSVR Index 33.78 HRUI  PVR SVR Ratio 0.16  TPVR/TSVR Ratio 0.25    ECHOCARDIOGRAM REPORT       Patient Name:   Erica Estrada Date of Exam: 11/27/2022  Medical Rec #:   [id number]        Height:       65.0 in  Accession #:    [id number]       Weight:       162.0 lb  Date of Birth:  [birth year]        BSA:          1.809 m  Patient Age:    72 years         BP:           151/80 mmHg  Patient Gender: F                HR:           69 bpm.  Exam Location:  [facility]   Procedure: 2D Echo, Cardiac Doppler and Color Doppler   Indications:     CAD native vessel I25.10    History:  Patient has prior history of Echocardiogram examinations,  most                  recent 01/23/2021. Risk Factors:Diabetes and  Hypertension.    Sonographer:    Cristela Blue  Referring Phys:  [id number] CHRISTOPHER END  Diagnosing Phys: Yvonne Kendall MD   IMPRESSIONS     1. Left ventricular ejection fraction, by estimation, is 55 to 60%. The  left ventricle has normal function. The left ventricle has no regional  wall motion abnormalities. Left ventricular diastolic parameters are  consistent with Grade I diastolic  dysfunction (impaired relaxation).   2. Right ventricular systolic function is normal. The right ventricular  size is normal. Tricuspid regurgitation signal is inadequate for assessing  PA pressure.   3. The mitral valve is degenerative. Mild mitral valve regurgitation. No  evidence of mitral stenosis.   4. The aortic valve is tricuspid. There is mild thickening of the aortic  valve. Aortic valve regurgitation is not visualized. Aortic valve  sclerosis is present, with no evidence of aortic valve stenosis.   FINDINGS   Left Ventricle: Left ventricular ejection fraction, by estimation, is 55  to 60%. The left ventricle has normal function. The left ventricle has no  regional wall motion abnormalities. The left ventricular internal cavity  size was normal in size. There is   no left ventricular hypertrophy. Left ventricular diastolic parameters  are consistent with Grade I diastolic dysfunction (impaired relaxation).   Right Ventricle: The right ventricular size is  normal. No increase in  right ventricular wall thickness. Right ventricular systolic function is  normal. Tricuspid regurgitation signal is inadequate for assessing PA  pressure.   Left Atrium: Left atrial size was normal in size.   Right Atrium: Right atrial size was normal in size.   Pericardium: The pericardium was not well visualized.   Mitral Valve: The mitral valve is degenerative in appearance. There is  mild thickening of the mitral valve leaflet(s). Mild mitral valve  regurgitation. No evidence of mitral valve stenosis. MV peak gradient, 5.0  mmHg. The mean mitral valve gradient is  1.0 mmHg.   Tricuspid Valve: The tricuspid valve is normal in structure. Tricuspid  valve regurgitation is trivial.   Aortic Valve: The aortic valve is tricuspid. There is mild thickening of  the aortic valve. There is mild aortic valve annular calcification. Aortic  valve regurgitation is not visualized. Aortic valve sclerosis is present,  with no evidence of aortic valve   stenosis. Aortic valve mean gradient measures 3.3 mmHg. Aortic valve peak  gradient measures 5.8 mmHg. Aortic valve area, by VTI measures 1.87 cm.   Pulmonic Valve: The pulmonic valve was normal in structure. Pulmonic valve  regurgitation is trivial.   Aorta: The aortic root is normal in size and structure.   Pulmonary Artery: The pulmonary artery is not well seen.   Venous: The inferior vena cava was not well visualized.   IAS/Shunts: The interatrial septum was not well visualized.     LEFT VENTRICLE  PLAX 2D  LVIDd:         3.70 cm   Diastology  LVIDs:         2.60 cm   LV e' medial:    6.96 cm/s  LV PW:         0.92 cm   LV E/e' medial:  11.5  LV IVS:        1.06 cm   LV e' lateral:   10.00  cm/s  LVOT diam:     2.00 cm   LV E/e' lateral: 8.0  LV SV:         49  LV SV Index:   27  LVOT Area:     3.14 cm     RIGHT VENTRICLE  RV Basal diam:  2.60 cm  RV Mid diam:    2.40 cm  RV S prime:     9.79 cm/s   TAPSE (M-mode): 2.2 cm   LEFT ATRIUM             Index        RIGHT ATRIUM          Index  LA diam:        2.60 cm 1.44 cm/m   RA Area:     9.24 cm  LA Vol (A2C):   31.9 ml 17.64 ml/m  RA Volume:   17.90 ml 9.90 ml/m  LA Vol (A4C):   31.0 ml 17.14 ml/m  LA Biplane Vol: 32.5 ml 17.97 ml/m   AORTIC VALVE  AV Area (Vmax):    2.02 cm  AV Area (Vmean):   1.88 cm  AV Area (VTI):     1.87 cm  AV Vmax:           120.67 cm/s  AV Vmean:          86.367 cm/s  AV VTI:            0.264 m  AV Peak Grad:      5.8 mmHg  AV Mean Grad:      3.3 mmHg  LVOT Vmax:         77.40 cm/s  LVOT Vmean:        51.800 cm/s  LVOT VTI:          0.157 m  LVOT/AV VTI ratio: 0.59    AORTA  Ao Root diam: 3.00 cm   MITRAL VALVE  MV Area (PHT): 3.63 cm     SHUNTS  MV Area VTI:   1.74 cm     Systemic VTI:  0.16 m  MV Peak grad:  5.0 mmHg     Systemic Diam: 2.00 cm  MV Mean grad:  1.0 mmHg  MV Vmax:       1.12 m/s  MV Vmean:      52.3 cm/s  MV Decel Time: 209 msec  MV E velocity: 80.20 cm/s  MV A velocity: 103.00 cm/s  MV E/A ratio:  0.78   Cristal Deer End MD  Electronically signed by Yvonne Kendall MD  Signature Date/Time: 11/28/2022/5:19:58 AM        Final        Impression:   This 72 year old diabetic woman has severe three-vessel coronary disease with worsening symptoms of chest discomfort, shortness of breath, and fatigue.  I agree that coronary artery bypass graft surgery is the best treatment for her for symptom relief and to prevent further ischemia and infarction.  I reviewed the cardiac catheterization images with her and her family and answered their questions. I discussed the operative procedure with the patient and family including alternatives, benefits and risks; including but not limited to bleeding, blood transfusion, infection, stroke, myocardial infarction, graft failure, heart block requiring a permanent pacemaker, organ dysfunction, and death.  Erica Estrada understands  and agrees to proceed.      Plan:   Coronary bypass graft surgery.      Alleen Borne, MD [facility] [phone]  [phone] 

## 2022-12-12 NOTE — Anesthesia Preprocedure Evaluation (Signed)
Anesthesia Evaluation  Patient identified by MRN, date of birth, ID band Patient awake    Reviewed: Allergy & Precautions, NPO status , Patient's Chart, lab work & pertinent test results  Airway Mallampati: II  TM Distance: >3 FB Neck ROM: Full    Dental  (+) Dental Advisory Given, Missing   Pulmonary asthma , former smoker   Pulmonary exam normal breath sounds clear to auscultation       Cardiovascular hypertension, Pt. on medications + CAD  Normal cardiovascular exam Rhythm:Regular Rate:Normal     Neuro/Psych  Headaches PSYCHIATRIC DISORDERS Anxiety Depression     Neuromuscular disease    GI/Hepatic ,GERD  ,,(+) Hepatitis - (s/p treatment), C  Endo/Other  diabetes, Type 2, Insulin Dependent, Oral Hypoglycemic Agents Hyperthyroidism   Renal/GU negative Renal ROS     Musculoskeletal  (+) Arthritis ,    Abdominal   Peds  Hematology  (+) Blood dyscrasia, anemia   Anesthesia Other Findings   Reproductive/Obstetrics                             Anesthesia Physical Anesthesia Plan  ASA: 4  Anesthesia Plan: General   Post-op Pain Management:    Induction: Intravenous  PONV Risk Score and Plan: 3 and Midazolam  Airway Management Planned: Oral ETT  Additional Equipment: Arterial line, CVP, PA Cath, TEE and Ultrasound Guidance Line Placement  Intra-op Plan:   Post-operative Plan: Post-operative intubation/ventilation  Informed Consent: I have reviewed the patients History and Physical, chart, labs and discussed the procedure including the risks, benefits and alternatives for the proposed anesthesia with the patient or authorized representative who has indicated his/her understanding and acceptance.     Dental advisory given  Plan Discussed with: CRNA  Anesthesia Plan Comments:        Anesthesia Quick Evaluation

## 2022-12-13 ENCOUNTER — Inpatient Hospital Stay (HOSPITAL_COMMUNITY): Payer: Self-pay | Admitting: Physician Assistant

## 2022-12-13 ENCOUNTER — Inpatient Hospital Stay (HOSPITAL_COMMUNITY)
Admission: RE | Disposition: A | Discharge status: Home Health | Payer: Self-pay | Source: Home / Self Care | Attending: Surgery

## 2022-12-13 ENCOUNTER — Inpatient Hospital Stay (HOSPITAL_COMMUNITY): Payer: Medicare HMO

## 2022-12-13 ENCOUNTER — Inpatient Hospital Stay (HOSPITAL_COMMUNITY)
Admission: RE | Admit: 2022-12-13 | Discharge: 2022-12-20 | DRG: 236 | Disposition: A | Discharge status: Home Health | Payer: Medicare HMO | Attending: Surgery | Admitting: Surgery

## 2022-12-13 ENCOUNTER — Other Ambulatory Visit: Payer: Self-pay

## 2022-12-13 ENCOUNTER — Encounter (HOSPITAL_COMMUNITY): Payer: Self-pay | Admitting: Surgery

## 2022-12-13 ENCOUNTER — Inpatient Hospital Stay (HOSPITAL_COMMUNITY): Payer: Medicare HMO | Admitting: Anesthesiology

## 2022-12-13 DIAGNOSIS — E785 Hyperlipidemia, unspecified: Secondary | ICD-10-CM | POA: Diagnosis present

## 2022-12-13 DIAGNOSIS — E0500 Thyrotoxicosis with diffuse goiter without thyrotoxic crisis or storm: Secondary | ICD-10-CM | POA: Diagnosis present

## 2022-12-13 DIAGNOSIS — I2510 Atherosclerotic heart disease of native coronary artery without angina pectoris: Principal | ICD-10-CM | POA: Diagnosis present

## 2022-12-13 DIAGNOSIS — K219 Gastro-esophageal reflux disease without esophagitis: Secondary | ICD-10-CM | POA: Diagnosis present

## 2022-12-13 DIAGNOSIS — Z1152 Encounter for screening for COVID-19: Secondary | ICD-10-CM | POA: Diagnosis not present

## 2022-12-13 DIAGNOSIS — Z7982 Long term (current) use of aspirin: Secondary | ICD-10-CM | POA: Diagnosis not present

## 2022-12-13 DIAGNOSIS — I10 Essential (primary) hypertension: Secondary | ICD-10-CM | POA: Diagnosis present

## 2022-12-13 DIAGNOSIS — K5900 Constipation, unspecified: Secondary | ICD-10-CM | POA: Diagnosis not present

## 2022-12-13 DIAGNOSIS — Z79899 Other long term (current) drug therapy: Secondary | ICD-10-CM | POA: Diagnosis not present

## 2022-12-13 DIAGNOSIS — D62 Acute posthemorrhagic anemia: Secondary | ICD-10-CM | POA: Diagnosis not present

## 2022-12-13 DIAGNOSIS — Z951 Presence of aortocoronary bypass graft: Principal | ICD-10-CM

## 2022-12-13 DIAGNOSIS — Z794 Long term (current) use of insulin: Secondary | ICD-10-CM

## 2022-12-13 DIAGNOSIS — E119 Type 2 diabetes mellitus without complications: Secondary | ICD-10-CM | POA: Diagnosis present

## 2022-12-13 DIAGNOSIS — F32A Depression, unspecified: Secondary | ICD-10-CM | POA: Diagnosis present

## 2022-12-13 DIAGNOSIS — Z7984 Long term (current) use of oral hypoglycemic drugs: Secondary | ICD-10-CM | POA: Diagnosis not present

## 2022-12-13 DIAGNOSIS — Z23 Encounter for immunization: Secondary | ICD-10-CM | POA: Diagnosis present

## 2022-12-13 DIAGNOSIS — Z888 Allergy status to other drugs, medicaments and biological substances status: Secondary | ICD-10-CM | POA: Diagnosis not present

## 2022-12-13 DIAGNOSIS — Z87891 Personal history of nicotine dependence: Secondary | ICD-10-CM

## 2022-12-13 DIAGNOSIS — J45909 Unspecified asthma, uncomplicated: Secondary | ICD-10-CM | POA: Diagnosis present

## 2022-12-13 HISTORY — PX: TEE WITHOUT CARDIOVERSION: SHX5443

## 2022-12-13 HISTORY — PX: CORONARY ARTERY BYPASS GRAFT: SHX141

## 2022-12-13 LAB — POCT I-STAT 7, (LYTES, BLD GAS, ICA,H+H)
Acid-Base Excess: 1 mmol/L (ref 0.0–2.0)
Acid-Base Excess: 0 mmol/L (ref 0.0–2.0)
Acid-base deficit: 1 mmol/L (ref 0.0–2.0)
Acid-base deficit: 3 mmol/L — ABNORMAL HIGH (ref 0.0–2.0)
Acid-base deficit: 2 mmol/L (ref 0.0–2.0)
Acid-base deficit: 5 mmol/L — ABNORMAL HIGH (ref 0.0–2.0)
Acid-base deficit: 1 mmol/L (ref 0.0–2.0)
Bicarbonate: 24.3 mmol/L (ref 20.0–28.0)
Bicarbonate: 21.9 mmol/L (ref 20.0–28.0)
Bicarbonate: 25.1 mmol/L (ref 20.0–28.0)
Bicarbonate: 23.8 mmol/L (ref 20.0–28.0)
Bicarbonate: 23.7 mmol/L (ref 20.0–28.0)
Bicarbonate: 21.9 mmol/L (ref 20.0–28.0)
Bicarbonate: 24.4 mmol/L (ref 20.0–28.0)
Calcium, Ion: 1.17 mmol/L (ref 1.15–1.40)
Calcium, Ion: 1.03 mmol/L — ABNORMAL LOW (ref 1.15–1.40)
Calcium, Ion: 0.98 mmol/L — ABNORMAL LOW (ref 1.15–1.40)
Calcium, Ion: 0.99 mmol/L — ABNORMAL LOW (ref 1.15–1.40)
Calcium, Ion: 1.06 mmol/L — ABNORMAL LOW (ref 1.15–1.40)
Calcium, Ion: 1.12 mmol/L — ABNORMAL LOW (ref 1.15–1.40)
Calcium, Ion: 1.1 mmol/L — ABNORMAL LOW (ref 1.15–1.40)
HCT: 36 % (ref 36.0–46.0)
HCT: 25 % — ABNORMAL LOW (ref 36.0–46.0)
HCT: 23 % — ABNORMAL LOW (ref 36.0–46.0)
HCT: 24 % — ABNORMAL LOW (ref 36.0–46.0)
HCT: 28 % — ABNORMAL LOW (ref 36.0–46.0)
HCT: 30 % — ABNORMAL LOW (ref 36.0–46.0)
HCT: 29 % — ABNORMAL LOW (ref 36.0–46.0)
Hemoglobin: 12.2 g/dL (ref 12.0–15.0)
Hemoglobin: 8.5 g/dL — ABNORMAL LOW (ref 12.0–15.0)
Hemoglobin: 7.8 g/dL — ABNORMAL LOW (ref 12.0–15.0)
Hemoglobin: 8.2 g/dL — ABNORMAL LOW (ref 12.0–15.0)
Hemoglobin: 9.5 g/dL — ABNORMAL LOW (ref 12.0–15.0)
Hemoglobin: 10.2 g/dL — ABNORMAL LOW (ref 12.0–15.0)
Hemoglobin: 9.9 g/dL — ABNORMAL LOW (ref 12.0–15.0)
O2 Saturation: 100 %
O2 Saturation: 100 %
O2 Saturation: 100 %
O2 Saturation: 100 %
O2 Saturation: 98 %
O2 Saturation: 95 %
O2 Saturation: 96 %
Patient temperature: 36.4
Patient temperature: 37.5
Patient temperature: 37.4
Potassium: 3.2 mmol/L — ABNORMAL LOW (ref 3.5–5.1)
Potassium: 3.8 mmol/L (ref 3.5–5.1)
Potassium: 4.1 mmol/L (ref 3.5–5.1)
Potassium: 3.5 mmol/L (ref 3.5–5.1)
Potassium: 3.1 mmol/L — ABNORMAL LOW (ref 3.5–5.1)
Potassium: 4.4 mmol/L (ref 3.5–5.1)
Potassium: 3.9 mmol/L (ref 3.5–5.1)
Sodium: 142 mmol/L (ref 135–145)
Sodium: 141 mmol/L (ref 135–145)
Sodium: 139 mmol/L (ref 135–145)
Sodium: 142 mmol/L (ref 135–145)
Sodium: 142 mmol/L (ref 135–145)
Sodium: 142 mmol/L (ref 135–145)
Sodium: 143 mmol/L (ref 135–145)
TCO2: 26 mmol/L (ref 22–32)
TCO2: 23 mmol/L (ref 22–32)
TCO2: 26 mmol/L (ref 22–32)
TCO2: 25 mmol/L (ref 22–32)
TCO2: 25 mmol/L (ref 22–32)
TCO2: 23 mmol/L (ref 22–32)
TCO2: 26 mmol/L (ref 22–32)
pCO2 arterial: 41 mm[Hg] (ref 32–48)
pCO2 arterial: 36.5 mm[Hg] (ref 32–48)
pCO2 arterial: 35.9 mm[Hg] (ref 32–48)
pCO2 arterial: 35.7 mm[Hg] (ref 32–48)
pCO2 arterial: 41.5 mm[Hg] (ref 32–48)
pCO2 arterial: 46.4 mm[Hg] (ref 32–48)
pCO2 arterial: 45.4 mm[Hg] (ref 32–48)
pH, Arterial: 7.381 (ref 7.35–7.45)
pH, Arterial: 7.385 (ref 7.35–7.45)
pH, Arterial: 7.452 — ABNORMAL HIGH (ref 7.35–7.45)
pH, Arterial: 7.432 (ref 7.35–7.45)
pH, Arterial: 7.361 (ref 7.35–7.45)
pH, Arterial: 7.284 — ABNORMAL LOW (ref 7.35–7.45)
pH, Arterial: 7.34 — ABNORMAL LOW (ref 7.35–7.45)
pO2, Arterial: 368 mm[Hg] — ABNORMAL HIGH (ref 83–108)
pO2, Arterial: 315 mm[Hg] — ABNORMAL HIGH (ref 83–108)
pO2, Arterial: 356 mm[Hg] — ABNORMAL HIGH (ref 83–108)
pO2, Arterial: 279 mm[Hg] — ABNORMAL HIGH (ref 83–108)
pO2, Arterial: 107 mm[Hg] (ref 83–108)
pO2, Arterial: 91 mm[Hg] (ref 83–108)
pO2, Arterial: 89 mm[Hg] (ref 83–108)

## 2022-12-13 LAB — POCT I-STAT, CHEM 8
BUN: 7 mg/dL — ABNORMAL LOW (ref 8–23)
BUN: 7 mg/dL — ABNORMAL LOW (ref 8–23)
BUN: 5 mg/dL — ABNORMAL LOW (ref 8–23)
BUN: 5 mg/dL — ABNORMAL LOW (ref 8–23)
BUN: 5 mg/dL — ABNORMAL LOW (ref 8–23)
Calcium, Ion: 1.15 mmol/L (ref 1.15–1.40)
Calcium, Ion: 1.22 mmol/L (ref 1.15–1.40)
Calcium, Ion: 0.98 mmol/L — ABNORMAL LOW (ref 1.15–1.40)
Calcium, Ion: 0.99 mmol/L — ABNORMAL LOW (ref 1.15–1.40)
Calcium, Ion: 0.98 mmol/L — ABNORMAL LOW (ref 1.15–1.40)
Chloride: 105 mmol/L (ref 98–111)
Chloride: 105 mmol/L (ref 98–111)
Chloride: 102 mmol/L (ref 98–111)
Chloride: 102 mmol/L (ref 98–111)
Chloride: 103 mmol/L (ref 98–111)
Creatinine, Ser: 0.3 mg/dL — ABNORMAL LOW (ref 0.44–1.00)
Creatinine, Ser: 0.4 mg/dL — ABNORMAL LOW (ref 0.44–1.00)
Creatinine, Ser: 0.3 mg/dL — ABNORMAL LOW (ref 0.44–1.00)
Creatinine, Ser: 0.3 mg/dL — ABNORMAL LOW (ref 0.44–1.00)
Creatinine, Ser: 0.3 mg/dL — ABNORMAL LOW (ref 0.44–1.00)
Glucose, Bld: 137 mg/dL — ABNORMAL HIGH (ref 70–99)
Glucose, Bld: 112 mg/dL — ABNORMAL HIGH (ref 70–99)
Glucose, Bld: 120 mg/dL — ABNORMAL HIGH (ref 70–99)
Glucose, Bld: 91 mg/dL (ref 70–99)
Glucose, Bld: 125 mg/dL — ABNORMAL HIGH (ref 70–99)
HCT: 33 % — ABNORMAL LOW (ref 36.0–46.0)
HCT: 31 % — ABNORMAL LOW (ref 36.0–46.0)
HCT: 24 % — ABNORMAL LOW (ref 36.0–46.0)
HCT: 24 % — ABNORMAL LOW (ref 36.0–46.0)
HCT: 26 % — ABNORMAL LOW (ref 36.0–46.0)
Hemoglobin: 11.2 g/dL — ABNORMAL LOW (ref 12.0–15.0)
Hemoglobin: 10.5 g/dL — ABNORMAL LOW (ref 12.0–15.0)
Hemoglobin: 8.2 g/dL — ABNORMAL LOW (ref 12.0–15.0)
Hemoglobin: 8.2 g/dL — ABNORMAL LOW (ref 12.0–15.0)
Hemoglobin: 8.8 g/dL — ABNORMAL LOW (ref 12.0–15.0)
Potassium: 3.2 mmol/L — ABNORMAL LOW (ref 3.5–5.1)
Potassium: 2.8 mmol/L — ABNORMAL LOW (ref 3.5–5.1)
Potassium: 3 mmol/L — ABNORMAL LOW (ref 3.5–5.1)
Potassium: 4 mmol/L (ref 3.5–5.1)
Potassium: 3.5 mmol/L (ref 3.5–5.1)
Sodium: 142 mmol/L (ref 135–145)
Sodium: 142 mmol/L (ref 135–145)
Sodium: 140 mmol/L (ref 135–145)
Sodium: 143 mmol/L (ref 135–145)
Sodium: 142 mmol/L (ref 135–145)
TCO2: 26 mmol/L (ref 22–32)
TCO2: 24 mmol/L (ref 22–32)
TCO2: 26 mmol/L (ref 22–32)
TCO2: 26 mmol/L (ref 22–32)
TCO2: 25 mmol/L (ref 22–32)

## 2022-12-13 LAB — CBC
HCT: 26.5 % — ABNORMAL LOW (ref 36.0–46.0)
HCT: 28.8 % — ABNORMAL LOW (ref 36.0–46.0)
Hemoglobin: 8.6 g/dL — ABNORMAL LOW (ref 12.0–15.0)
Hemoglobin: 9.3 g/dL — ABNORMAL LOW (ref 12.0–15.0)
MCH: 27.8 pg (ref 26.0–34.0)
MCH: 28.7 pg (ref 26.0–34.0)
MCHC: 32.5 g/dL (ref 30.0–36.0)
MCHC: 32.3 g/dL (ref 30.0–36.0)
MCV: 85.8 fL (ref 80.0–100.0)
MCV: 88.9 fL (ref 80.0–100.0)
Platelets: 159 10*3/uL (ref 150–400)
Platelets: 207 10*3/uL (ref 150–400)
RBC: 3.09 MIL/uL — ABNORMAL LOW (ref 3.87–5.11)
RBC: 3.24 MIL/uL — ABNORMAL LOW (ref 3.87–5.11)
RDW: 13.2 % (ref 11.5–15.5)
RDW: 13.2 % (ref 11.5–15.5)
WBC: 13.9 10*3/uL — ABNORMAL HIGH (ref 4.0–10.5)
WBC: 15.9 10*3/uL — ABNORMAL HIGH (ref 4.0–10.5)
nRBC: 0 % (ref 0.0–0.2)
nRBC: 0 % (ref 0.0–0.2)

## 2022-12-13 LAB — ECHO INTRAOPERATIVE TEE
Height: 65 in
Weight: 2624 [oz_av]

## 2022-12-13 LAB — PROTIME-INR
INR: 1.2 (ref 0.8–1.2)
Prothrombin Time: 15.5 s — ABNORMAL HIGH (ref 11.4–15.2)

## 2022-12-13 LAB — APTT: aPTT: 26 s (ref 24–36)

## 2022-12-13 LAB — POCT I-STAT EG7
Acid-base deficit: 2 mmol/L (ref 0.0–2.0)
Bicarbonate: 22.5 mmol/L (ref 20.0–28.0)
Calcium, Ion: 1.02 mmol/L — ABNORMAL LOW (ref 1.15–1.40)
HCT: 25 % — ABNORMAL LOW (ref 36.0–46.0)
Hemoglobin: 8.5 g/dL — ABNORMAL LOW (ref 12.0–15.0)
O2 Saturation: 80 %
Potassium: 3 mmol/L — ABNORMAL LOW (ref 3.5–5.1)
Sodium: 143 mmol/L (ref 135–145)
TCO2: 24 mmol/L (ref 22–32)
pCO2, Ven: 38.3 mm[Hg] — ABNORMAL LOW (ref 44–60)
pH, Ven: 7.376 (ref 7.25–7.43)
pO2, Ven: 45 mm[Hg] (ref 32–45)

## 2022-12-13 LAB — SARS CORONAVIRUS 2 BY RT PCR: SARS Coronavirus 2 by RT PCR: NEGATIVE

## 2022-12-13 LAB — BASIC METABOLIC PANEL
Anion gap: 7 (ref 5–15)
BUN: 7 mg/dL — ABNORMAL LOW (ref 8–23)
CO2: 24 mmol/L (ref 22–32)
Calcium: 7.5 mg/dL — ABNORMAL LOW (ref 8.9–10.3)
Chloride: 109 mmol/L (ref 98–111)
Creatinine, Ser: 0.59 mg/dL (ref 0.44–1.00)
GFR, Estimated: >60 mL/min (ref >60)
Glucose, Bld: 141 mg/dL — ABNORMAL HIGH (ref 70–99)
Potassium: 3.8 mmol/L (ref 3.5–5.1)
Sodium: 140 mmol/L (ref 135–145)

## 2022-12-13 LAB — GLUCOSE, CAPILLARY
Glucose-Capillary: 129 mg/dL — ABNORMAL HIGH (ref 70–99)
Glucose-Capillary: 100 mg/dL — ABNORMAL HIGH (ref 70–99)
Glucose-Capillary: 121 mg/dL — ABNORMAL HIGH (ref 70–99)
Glucose-Capillary: 112 mg/dL — ABNORMAL HIGH (ref 70–99)
Glucose-Capillary: 94 mg/dL (ref 70–99)
Glucose-Capillary: 114 mg/dL — ABNORMAL HIGH (ref 70–99)
Glucose-Capillary: 137 mg/dL — ABNORMAL HIGH (ref 70–99)
Glucose-Capillary: 126 mg/dL — ABNORMAL HIGH (ref 70–99)
Glucose-Capillary: 140 mg/dL — ABNORMAL HIGH (ref 70–99)
Glucose-Capillary: 123 mg/dL — ABNORMAL HIGH (ref 70–99)

## 2022-12-13 LAB — MAGNESIUM: Magnesium: 3.1 mg/dL — ABNORMAL HIGH (ref 1.7–2.4)

## 2022-12-13 LAB — PLATELET COUNT: Platelets: 164 10*3/uL (ref 150–400)

## 2022-12-13 LAB — HEMOGLOBIN AND HEMATOCRIT, BLOOD
HCT: 23.5 % — ABNORMAL LOW (ref 36.0–46.0)
Hemoglobin: 7.7 g/dL — ABNORMAL LOW (ref 12.0–15.0)

## 2022-12-13 LAB — PREPARE RBC (CROSSMATCH)

## 2022-12-13 SURGERY — CORONARY ARTERY BYPASS GRAFTING (CABG)
Anesthesia: General | Site: Chest

## 2022-12-13 MED ORDER — PROPOFOL 10 MG/ML IV BOLUS
INTRAVENOUS | Status: DC | PRN
  Administered 2022-12-13: 30 mg via INTRAVENOUS
  Administered 2022-12-13: 60 mg via INTRAVENOUS

## 2022-12-13 MED ORDER — METOPROLOL TARTRATE 12.5 MG HALF TABLET: 12.5000 mg | ORAL_TABLET | Freq: Two times a day (BID) | ORAL | Status: DC

## 2022-12-13 MED ORDER — FERROUS SULFATE 325 (65 FE) MG PO TABS: 325.0000 mg | ORAL_TABLET | Freq: Every day | ORAL | Status: DC

## 2022-12-13 MED ORDER — THROMBIN (RECOMBINANT) 20000 UNITS EX SOLR
CUTANEOUS | Status: AC
  Filled 2022-12-13: qty 20000

## 2022-12-13 MED ORDER — CEFAZOLIN SODIUM-DEXTROSE 2-4 GM/100ML-% IV SOLN
2.0000 g | Freq: Three times a day (TID) | INTRAVENOUS | Status: AC
  Administered 2022-12-13 – 2022-12-15 (×6): 2 g via INTRAVENOUS
  Filled 2022-12-13 (×6): qty 100

## 2022-12-13 MED ORDER — ORAL CARE MOUTH RINSE
15.0000 mL | OROMUCOSAL | Status: DC
  Administered 2022-12-13 (×2): 15 mL via OROMUCOSAL

## 2022-12-13 MED ORDER — CHLORHEXIDINE GLUCONATE 0.12 % MT SOLN
15.0000 mL | OROMUCOSAL | Status: AC
  Administered 2022-12-13: 15 mL via OROMUCOSAL

## 2022-12-13 MED ORDER — HYDRALAZINE HCL 20 MG/ML IJ SOLN
10.0000 mg | Freq: Four times a day (QID) | INTRAMUSCULAR | Status: DC | PRN
  Filled 2022-12-13: qty 1

## 2022-12-13 MED ORDER — ACETAMINOPHEN 160 MG/5ML PO SOLN: 1000.0000 mg | Freq: Four times a day (QID) | ORAL | Status: DC

## 2022-12-13 MED ORDER — ACETAMINOPHEN 650 MG RE SUPP
650.0000 mg | Freq: Once | RECTAL | Status: AC
  Administered 2022-12-13: 650 mg via RECTAL

## 2022-12-13 MED ORDER — OXYCODONE HCL 5 MG PO TABS: 5.0000 mg | ORAL_TABLET | ORAL | Status: DC | PRN

## 2022-12-13 MED ORDER — ALBUMIN HUMAN 5 % IV SOLN
INTRAVENOUS | Status: DC | PRN
Start: 2022-12-13 — End: 2022-12-13

## 2022-12-13 MED ORDER — BISACODYL 10 MG RE SUPP: 10.0000 mg | Freq: Every day | RECTAL | Status: DC

## 2022-12-13 MED ORDER — FENTANYL CITRATE (PF) 250 MCG/5ML IJ SOLN
INTRAMUSCULAR | Status: AC
  Filled 2022-12-13: qty 5

## 2022-12-13 MED ORDER — METOPROLOL TARTRATE 12.5 MG HALF TABLET
12.5000 mg | ORAL_TABLET | Freq: Once | ORAL | Status: AC
  Administered 2022-12-13: 12.5 mg via ORAL
  Filled 2022-12-13: qty 1

## 2022-12-13 MED ORDER — METOCLOPRAMIDE HCL 5 MG/ML IJ SOLN
10.0000 mg | Freq: Four times a day (QID) | INTRAMUSCULAR | Status: AC
  Administered 2022-12-13 – 2022-12-14 (×6): 10 mg via INTRAVENOUS
  Filled 2022-12-13 (×6): qty 2

## 2022-12-13 MED ORDER — ASPIRIN 325 MG PO TBEC
325.0000 mg | DELAYED_RELEASE_TABLET | Freq: Every day | ORAL | Status: DC
  Administered 2022-12-14 – 2022-12-20 (×7): 325 mg via ORAL
  Filled 2022-12-13 (×7): qty 1

## 2022-12-13 MED ORDER — CYCLOBENZAPRINE HCL 10 MG PO TABS
10.0000 mg | ORAL_TABLET | Freq: Three times a day (TID) | ORAL | Status: DC
  Administered 2022-12-13 – 2022-12-15 (×7): 10 mg
  Filled 2022-12-13 (×7): qty 1

## 2022-12-13 MED ORDER — MORPHINE SULFATE (PF) 2 MG/ML IV SOLN
1.0000 mg | INTRAVENOUS | Status: DC | PRN
  Administered 2022-12-13 – 2022-12-14 (×4): 2 mg via INTRAVENOUS
  Administered 2022-12-14 (×2): 4 mg via INTRAVENOUS
  Administered 2022-12-14 (×4): 2 mg via INTRAVENOUS
  Filled 2022-12-13 (×2): qty 1
  Filled 2022-12-13: qty 2
  Filled 2022-12-13 (×2): qty 1
  Filled 2022-12-13 (×2): qty 2
  Filled 2022-12-13 (×4): qty 1

## 2022-12-13 MED ORDER — ALBUTEROL SULFATE (2.5 MG/3ML) 0.083% IN NEBU: 3.0000 mL | INHALATION_SOLUTION | Freq: Four times a day (QID) | RESPIRATORY_TRACT | Status: DC | PRN

## 2022-12-13 MED ORDER — PREGABALIN 100 MG PO CAPS: 100.0000 mg | ORAL_CAPSULE | Freq: Two times a day (BID) | ORAL | Status: DC

## 2022-12-13 MED ORDER — DOCUSATE SODIUM 100 MG PO CAPS
200.0000 mg | ORAL_CAPSULE | Freq: Every day | ORAL | Status: DC
  Administered 2022-12-15 – 2022-12-18 (×4): 200 mg via ORAL
  Filled 2022-12-13 (×5): qty 2

## 2022-12-13 MED ORDER — NITROGLYCERIN 0.2 MG/ML ON CALL CATH LAB
INTRAVENOUS | Status: DC | PRN
Start: 2022-12-13 — End: 2022-12-13
  Administered 2022-12-13 (×6): 20 ug via INTRAVENOUS

## 2022-12-13 MED ORDER — TIOTROPIUM BROMIDE MONOHYDRATE 18 MCG IN CAPS: 1.0000 | ORAL_CAPSULE | Freq: Every day | RESPIRATORY_TRACT | Status: DC

## 2022-12-13 MED ORDER — MIDAZOLAM HCL (PF) 5 MG/ML IJ SOLN
INTRAMUSCULAR | Status: DC | PRN
  Administered 2022-12-13 (×3): 1 mg via INTRAVENOUS
  Administered 2022-12-13: 2 mg via INTRAVENOUS

## 2022-12-13 MED ORDER — ~~LOC~~ CARDIAC SURGERY, PATIENT & FAMILY EDUCATION
Freq: Once | Status: DC
  Filled 2022-12-13: qty 1

## 2022-12-13 MED ORDER — HEMOSTATIC AGENTS (NO CHARGE) OPTIME
TOPICAL | Status: DC | PRN
Start: 2022-12-13 — End: 2022-12-13
  Administered 2022-12-13: 1 via TOPICAL

## 2022-12-13 MED ORDER — GABAPENTIN 300 MG PO CAPS: 300.0000 mg | ORAL_CAPSULE | Freq: Every day | ORAL | Status: DC

## 2022-12-13 MED ORDER — LACTATED RINGERS IV SOLN: INTRAVENOUS | Status: DC

## 2022-12-13 MED ORDER — CALCIUM CARBONATE ANTACID 500 MG PO CHEW: 2.0000 | CHEWABLE_TABLET | Freq: Two times a day (BID) | ORAL | Status: DC | PRN

## 2022-12-13 MED ORDER — BISACODYL 5 MG PO TBEC
10.0000 mg | DELAYED_RELEASE_TABLET | Freq: Every day | ORAL | Status: DC
  Administered 2022-12-14 – 2022-12-17 (×4): 10 mg via ORAL
  Filled 2022-12-13 (×5): qty 2

## 2022-12-13 MED ORDER — MIDAZOLAM HCL (PF) 10 MG/2ML IJ SOLN
INTRAMUSCULAR | Status: AC
  Filled 2022-12-13: qty 2

## 2022-12-13 MED ORDER — SODIUM CHLORIDE 0.9 % IV SOLN: INTRAVENOUS | Status: DC | PRN

## 2022-12-13 MED ORDER — FENTANYL CITRATE (PF) 100 MCG/2ML IJ SOLN
INTRAMUSCULAR | Status: DC | PRN
  Administered 2022-12-13: 150 ug via INTRAVENOUS
  Administered 2022-12-13: 200 ug via INTRAVENOUS
  Administered 2022-12-13: 50 ug via INTRAVENOUS
  Administered 2022-12-13: 150 ug via INTRAVENOUS
  Administered 2022-12-13: 100 ug via INTRAVENOUS
  Administered 2022-12-13: 50 ug via INTRAVENOUS
  Administered 2022-12-13 (×2): 100 ug via INTRAVENOUS
  Administered 2022-12-13: 150 ug via INTRAVENOUS
  Administered 2022-12-13: 50 ug via INTRAVENOUS
  Administered 2022-12-13: 100 ug via INTRAVENOUS
  Administered 2022-12-13: 150 ug via INTRAVENOUS

## 2022-12-13 MED ORDER — SODIUM CHLORIDE 0.9 % IV SOLN: INTRAVENOUS | Status: DC

## 2022-12-13 MED ORDER — ASPIRIN 81 MG PO CHEW: 324.0000 mg | CHEWABLE_TABLET | Freq: Every day | ORAL | Status: DC

## 2022-12-13 MED ORDER — CHLORHEXIDINE GLUCONATE 4 % EX SOLN: 30.0000 mL | CUTANEOUS | Status: DC

## 2022-12-13 MED ORDER — HEPARIN SODIUM (PORCINE) 1000 UNIT/ML IJ SOLN
INTRAMUSCULAR | Status: AC
  Filled 2022-12-13: qty 1

## 2022-12-13 MED ORDER — ORAL CARE MOUTH RINSE: 15.0000 mL | OROMUCOSAL | Status: DC | PRN

## 2022-12-13 MED ORDER — SODIUM CHLORIDE 0.9% FLUSH
3.0000 mL | Freq: Two times a day (BID) | INTRAVENOUS | Status: DC
  Administered 2022-12-14 – 2022-12-16 (×6): 3 mL via INTRAVENOUS

## 2022-12-13 MED ORDER — TRAMADOL HCL 50 MG PO TABS: 50.0000 mg | ORAL_TABLET | ORAL | Status: DC | PRN

## 2022-12-13 MED ORDER — SODIUM BICARBONATE 8.4 % IV SOLN
50.0000 meq | Freq: Once | INTRAVENOUS | Status: AC
  Administered 2022-12-13: 50 meq via INTRAVENOUS

## 2022-12-13 MED ORDER — POTASSIUM CHLORIDE 10 MEQ/50ML IV SOLN
10.0000 meq | INTRAVENOUS | Status: AC
  Administered 2022-12-13 (×3): 10 meq via INTRAVENOUS
  Filled 2022-12-13: qty 50

## 2022-12-13 MED ORDER — ALBUMIN HUMAN 5 % IV SOLN
250.0000 mL | INTRAVENOUS | Status: DC | PRN
  Administered 2022-12-13 (×2): 12.5 g via INTRAVENOUS
  Filled 2022-12-13: qty 250

## 2022-12-13 MED ORDER — UMECLIDINIUM BROMIDE 62.5 MCG/ACT IN AEPB
1.0000 | INHALATION_SPRAY | Freq: Every day | RESPIRATORY_TRACT | Status: DC
  Administered 2022-12-14 – 2022-12-20 (×7): 1 via RESPIRATORY_TRACT
  Filled 2022-12-13 (×2): qty 7

## 2022-12-13 MED ORDER — ACETAMINOPHEN 160 MG/5ML PO SOLN: 650.0000 mg | Freq: Once | ORAL | Status: DC

## 2022-12-13 MED ORDER — PHENYLEPHRINE HCL (PRESSORS) 10 MG/ML IV SOLN
INTRAVENOUS | Status: DC | PRN
  Administered 2022-12-13: 80 ug via INTRAVENOUS

## 2022-12-13 MED ORDER — CLEVIDIPINE BUTYRATE 0.5 MG/ML IV EMUL
INTRAVENOUS | Status: DC | PRN
Start: 2022-12-13 — End: 2022-12-13
  Administered 2022-12-13: 2 mg/h via INTRAVENOUS

## 2022-12-13 MED ORDER — PLASMA-LYTE A IV SOLN: INTRAVENOUS | Status: DC | PRN

## 2022-12-13 MED ORDER — LACTATED RINGERS IV SOLN: INTRAVENOUS | Status: DC | PRN

## 2022-12-13 MED ORDER — SODIUM CHLORIDE 0.45 % IV SOLN: INTRAVENOUS | Status: DC | PRN

## 2022-12-13 MED ORDER — CHLORHEXIDINE GLUCONATE 0.12 % MT SOLN
15.0000 mL | Freq: Once | OROMUCOSAL | Status: AC
  Administered 2022-12-13: 15 mL via OROMUCOSAL
  Filled 2022-12-13: qty 15

## 2022-12-13 MED ORDER — SODIUM CHLORIDE 0.9 % IV SOLN: 250.0000 mL | INTRAVENOUS | Status: DC

## 2022-12-13 MED ORDER — PROTAMINE SULFATE 10 MG/ML IV SOLN
INTRAVENOUS | Status: DC | PRN
  Administered 2022-12-13: 240 mg via INTRAVENOUS
  Administered 2022-12-13: 30 mg via INTRAVENOUS

## 2022-12-13 MED ORDER — MIDAZOLAM HCL 2 MG/2ML IJ SOLN
2.0000 mg | INTRAMUSCULAR | Status: DC | PRN
  Administered 2022-12-13: 2 mg via INTRAVENOUS
  Filled 2022-12-13: qty 2

## 2022-12-13 MED ORDER — PANTOPRAZOLE SODIUM 40 MG IV SOLR
40.0000 mg | Freq: Every day | INTRAVENOUS | Status: AC
  Administered 2022-12-13 – 2022-12-14 (×2): 40 mg via INTRAVENOUS
  Filled 2022-12-13 (×2): qty 10

## 2022-12-13 MED ORDER — INSULIN REGULAR(HUMAN) IN NACL 100-0.9 UT/100ML-% IV SOLN: INTRAVENOUS | Status: DC

## 2022-12-13 MED ORDER — DEXTROSE 50 % IV SOLN: 0.0000 mL | INTRAVENOUS | Status: DC | PRN

## 2022-12-13 MED ORDER — HEPARIN SODIUM (PORCINE) 1000 UNIT/ML IJ SOLN
INTRAMUSCULAR | Status: DC | PRN
  Administered 2022-12-13: 27000 [IU] via INTRAVENOUS

## 2022-12-13 MED ORDER — VANCOMYCIN HCL IN DEXTROSE 1-5 GM/200ML-% IV SOLN
1000.0000 mg | Freq: Once | INTRAVENOUS | Status: AC
  Administered 2022-12-13: 1000 mg via INTRAVENOUS
  Filled 2022-12-13: qty 200

## 2022-12-13 MED ORDER — MAGNESIUM SULFATE 4 GM/100ML IV SOLN
4.0000 g | Freq: Once | INTRAVENOUS | Status: AC
  Administered 2022-12-13: 4 g via INTRAVENOUS
  Filled 2022-12-13: qty 100

## 2022-12-13 MED ORDER — CHLORHEXIDINE GLUCONATE CLOTH 2 % EX PADS
6.0000 | MEDICATED_PAD | Freq: Every day | CUTANEOUS | Status: DC
  Administered 2022-12-13 – 2022-12-18 (×6): 6 via TOPICAL

## 2022-12-13 MED ORDER — 0.9 % SODIUM CHLORIDE (POUR BTL) OPTIME
TOPICAL | Status: DC | PRN
Start: 2022-12-13 — End: 2022-12-13
  Administered 2022-12-13: 5000 mL

## 2022-12-13 MED ORDER — DEXMEDETOMIDINE HCL IN NACL 400 MCG/100ML IV SOLN
0.0000 ug/kg/h | INTRAVENOUS | Status: DC
  Administered 2022-12-13: 0.7 ug/kg/h via INTRAVENOUS
  Filled 2022-12-13: qty 100

## 2022-12-13 MED ORDER — METOPROLOL TARTRATE 5 MG/5ML IV SOLN
2.5000 mg | INTRAVENOUS | Status: DC | PRN
  Administered 2022-12-13 – 2022-12-14 (×3): 2.5 mg via INTRAVENOUS
  Filled 2022-12-13 (×2): qty 5

## 2022-12-13 MED ORDER — SERTRALINE HCL 25 MG PO TABS: 25.0000 mg | ORAL_TABLET | Freq: Every day | ORAL | Status: DC

## 2022-12-13 MED ORDER — PAROXETINE HCL 20 MG PO TABS: 40.0000 mg | ORAL_TABLET | ORAL | Status: DC

## 2022-12-13 MED ORDER — FOLIC ACID 1 MG PO TABS: 1.0000 mg | ORAL_TABLET | Freq: Every day | ORAL | Status: DC

## 2022-12-13 MED ORDER — ROCURONIUM BROMIDE 100 MG/10ML IV SOLN
INTRAVENOUS | Status: DC | PRN
  Administered 2022-12-13: 30 mg via INTRAVENOUS
  Administered 2022-12-13: 50 mg via INTRAVENOUS
  Administered 2022-12-13: 100 mg via INTRAVENOUS

## 2022-12-13 MED ORDER — INSULIN REGULAR(HUMAN) IN NACL 100-0.9 UT/100ML-% IV SOLN
INTRAVENOUS | Status: DC | PRN
  Administered 2022-12-13: 3 [IU]/h via INTRAVENOUS

## 2022-12-13 MED ORDER — PROTAMINE SULFATE 10 MG/ML IV SOLN
INTRAVENOUS | Status: AC
  Filled 2022-12-13: qty 25

## 2022-12-13 MED ORDER — METHIMAZOLE 2.5 MG HALF TABLET: 2.5000 mg | ORAL_TABLET | Freq: Every day | ORAL | Status: DC

## 2022-12-13 MED ORDER — PROPOFOL 10 MG/ML IV BOLUS
INTRAVENOUS | Status: AC
  Filled 2022-12-13: qty 20

## 2022-12-13 MED ORDER — ROCURONIUM BROMIDE 10 MG/ML (PF) SYRINGE
PREFILLED_SYRINGE | INTRAVENOUS | Status: AC
  Filled 2022-12-13: qty 20

## 2022-12-13 MED ORDER — SODIUM CHLORIDE 0.9% FLUSH: 3.0000 mL | INTRAVENOUS | Status: DC | PRN

## 2022-12-13 MED ORDER — METOPROLOL TARTRATE 25 MG/10 ML ORAL SUSPENSION: 12.5000 mg | Freq: Two times a day (BID) | ORAL | Status: DC

## 2022-12-13 MED ORDER — POTASSIUM CHLORIDE 10 MEQ/50ML IV SOLN
10.0000 meq | INTRAVENOUS | Status: AC
  Administered 2022-12-13 (×2): 10 meq via INTRAVENOUS
  Filled 2022-12-13: qty 50

## 2022-12-13 MED ORDER — PHENYLEPHRINE HCL-NACL 20-0.9 MG/250ML-% IV SOLN: 0.0000 ug/min | INTRAVENOUS | Status: DC

## 2022-12-13 MED ORDER — ACETAMINOPHEN 500 MG PO TABS
1000.0000 mg | ORAL_TABLET | Freq: Four times a day (QID) | ORAL | Status: DC
  Administered 2022-12-14: 1000 mg
  Filled 2022-12-13: qty 2

## 2022-12-13 MED ORDER — ONDANSETRON HCL 4 MG/2ML IJ SOLN
4.0000 mg | Freq: Four times a day (QID) | INTRAMUSCULAR | Status: DC | PRN
  Administered 2022-12-13 – 2022-12-14 (×2): 4 mg via INTRAVENOUS
  Filled 2022-12-13 (×2): qty 2

## 2022-12-13 MED ORDER — PHENYLEPHRINE 80 MCG/ML (10ML) SYRINGE FOR IV PUSH (FOR BLOOD PRESSURE SUPPORT)
PREFILLED_SYRINGE | INTRAVENOUS | Status: AC
  Filled 2022-12-13: qty 10

## 2022-12-13 MED ORDER — ASPIRIN 81 MG PO CHEW
324.0000 mg | CHEWABLE_TABLET | Freq: Once | ORAL | Status: AC
  Administered 2022-12-13: 324 mg
  Filled 2022-12-13: qty 4

## 2022-12-13 MED ORDER — TRAZODONE HCL 50 MG PO TABS
50.0000 mg | ORAL_TABLET | Freq: Every day | ORAL | Status: DC
  Administered 2022-12-13: 50 mg
  Filled 2022-12-13: qty 1

## 2022-12-13 MED ORDER — ATORVASTATIN CALCIUM 80 MG PO TABS: 80.0000 mg | ORAL_TABLET | Freq: Every day | ORAL | Status: DC

## 2022-12-13 MED ORDER — PANTOPRAZOLE SODIUM 40 MG PO TBEC
40.0000 mg | DELAYED_RELEASE_TABLET | Freq: Every day | ORAL | Status: DC
  Administered 2022-12-15 – 2022-12-20 (×6): 40 mg via ORAL
  Filled 2022-12-13 (×6): qty 1

## 2022-12-13 MED ORDER — THROMBIN 20000 UNITS EX SOLR: OROMUCOSAL | Status: DC | PRN

## 2022-12-13 SURGICAL SUPPLY — 94 items
ADH SKN CLS APL DERMABOND .7 (GAUZE/BANDAGES/DRESSINGS) ×2
BAG DECANTER FOR FLEXI CONT (MISCELLANEOUS) ×2 IMPLANT
BLADE STERNUM SYSTEM 6 (BLADE) ×2 IMPLANT
BLADE SURG 11 STRL SS (BLADE) IMPLANT
BNDG CMPR 5X4 KNIT ELC UNQ LF (GAUZE/BANDAGES/DRESSINGS) ×2
BNDG CMPR 6 X 5 YARDS HK CLSR (GAUZE/BANDAGES/DRESSINGS) ×2
BNDG ELASTIC 4INX 5YD STR LF (GAUZE/BANDAGES/DRESSINGS) IMPLANT
BNDG ELASTIC 6INX 5YD STR LF (GAUZE/BANDAGES/DRESSINGS) IMPLANT
BNDG GAUZE DERMACEA FLUFF 4 (GAUZE/BANDAGES/DRESSINGS) ×2 IMPLANT
BNDG GZE DERMACEA 4 6PLY (GAUZE/BANDAGES/DRESSINGS) ×2
CANISTER SUCT 3000ML PPV (MISCELLANEOUS) ×2 IMPLANT
CANNULA ARTERIAL VENT 3/8 20FR (CANNULA) IMPLANT
CANNULA MC2 2 STG 36/46 NON-V (CANNULA) IMPLANT
CATH ROBINSON RED A/P 18FR (CATHETERS) ×4 IMPLANT
CATH THORACIC 28FR (CATHETERS) ×2 IMPLANT
CATH THORACIC 36FR (CATHETERS) ×2 IMPLANT
CATH THORACIC 36FR RT ANG (CATHETERS) ×2 IMPLANT
CLIP TI WIDE RED SMALL 24 (CLIP) IMPLANT
CONN Y 3/8X3/8X3/8 BEN (MISCELLANEOUS) IMPLANT
CONTAINER PROTECT SURGISLUSH (MISCELLANEOUS) ×4 IMPLANT
DERMABOND ADVANCED .7 DNX12 (GAUZE/BANDAGES/DRESSINGS) IMPLANT
DRAPE CARDIOVASCULAR INCISE (DRAPES) ×2
DRAPE SRG 135X102X78XABS (DRAPES) ×2 IMPLANT
DRAPE WARM FLUID 44X44 (DRAPES) ×2 IMPLANT
DRSG COVADERM 4X14 (GAUZE/BANDAGES/DRESSINGS) ×2 IMPLANT
ELECT CAUTERY BLADE 6.4 (BLADE) ×2 IMPLANT
ELECT REM PT RETURN 9FT ADLT (ELECTROSURGICAL) ×4
ELECTRODE REM PT RTRN 9FT ADLT (ELECTROSURGICAL) ×4 IMPLANT
FELT TEFLON 1X6 (MISCELLANEOUS) ×2 IMPLANT
GAUZE SPONGE 4X4 12PLY STRL (GAUZE/BANDAGES/DRESSINGS) ×4 IMPLANT
GLOVE BIO SURGEON STRL SZ 6 (GLOVE) IMPLANT
GLOVE BIO SURGEON STRL SZ 6.5 (GLOVE) IMPLANT
GLOVE BIOGEL PI IND STRL 7.5 (GLOVE) IMPLANT
GLOVE ECLIPSE 7.0 STRL STRAW (GLOVE) ×4 IMPLANT
GLOVE ORTHO TXT STRL SZ7.5 (GLOVE) IMPLANT
GLOVE SS BIOGEL STRL SZ 6 (GLOVE) IMPLANT
GOWN STRL REUS W/ TWL LRG LVL3 (GOWN DISPOSABLE) ×8 IMPLANT
GOWN STRL REUS W/ TWL XL LVL3 (GOWN DISPOSABLE) ×2 IMPLANT
GOWN STRL REUS W/TWL LRG LVL3 (GOWN DISPOSABLE) ×8
GOWN STRL REUS W/TWL XL LVL3 (GOWN DISPOSABLE) ×2
HEMOSTAT POWDER SURGIFOAM 1G (HEMOSTASIS) ×6 IMPLANT
HEMOSTAT SURGICEL 2X14 (HEMOSTASIS) ×2 IMPLANT
KIT BASIN OR (CUSTOM PROCEDURE TRAY) ×2 IMPLANT
KIT SUCTION CATH 14FR (SUCTIONS) ×2 IMPLANT
KIT TURNOVER KIT B (KITS) ×2 IMPLANT
KIT VASOVIEW HEMOPRO 2 VH 4000 (KITS) ×2 IMPLANT
KNIFE MICRO-UNI 3.5 30 DEG (BLADE) ×2 IMPLANT
NS IRRIG 1000ML POUR BTL (IV SOLUTION) ×10 IMPLANT
PACK E OPEN HEART (SUTURE) ×2 IMPLANT
PACK OPEN HEART (CUSTOM PROCEDURE TRAY) ×2 IMPLANT
PAD ARMBOARD 7.5X6 YLW CONV (MISCELLANEOUS) ×4 IMPLANT
PAD ELECT DEFIB RADIOL ZOLL (MISCELLANEOUS) ×2 IMPLANT
PENCIL BUTTON HOLSTER BLD 10FT (ELECTRODE) ×2 IMPLANT
POSITIONER HEAD DONUT 9IN (MISCELLANEOUS) ×2 IMPLANT
PUNCH AORTIC ROTATE 4.0MM (MISCELLANEOUS) IMPLANT
PUNCH AORTIC ROTATE 4.5MM 8IN (MISCELLANEOUS) ×2 IMPLANT
SEALANT SURG COSEAL 8ML (VASCULAR PRODUCTS) IMPLANT
SET MPS 3-ND DEL (MISCELLANEOUS) IMPLANT
SPONGE T-LAP 4X18 ~~LOC~~+RFID (SPONGE) IMPLANT
SUPPORT HEART JANKE-BARRON (MISCELLANEOUS) ×2 IMPLANT
SUT BONE WAX W31G (SUTURE) ×2 IMPLANT
SUT ETHIBOND 2 0 SH (SUTURE) ×4
SUT ETHIBOND 2 0 SH 36X2 (SUTURE) IMPLANT
SUT MNCRL AB 4-0 PS2 18 (SUTURE) IMPLANT
SUT PROLENE 3 0 SH1 36 (SUTURE) ×2 IMPLANT
SUT PROLENE 4 0 RB 1 (SUTURE)
SUT PROLENE 4 0 SH DA (SUTURE) IMPLANT
SUT PROLENE 4-0 RB1 .5 CRCL 36 (SUTURE) IMPLANT
SUT PROLENE 5 0 C 1 36 (SUTURE) IMPLANT
SUT PROLENE 6 0 C 1 30 (SUTURE) IMPLANT
SUT PROLENE 7 0 BV 1 (SUTURE) IMPLANT
SUT PROLENE 7 0 BV1 MDA (SUTURE) ×2 IMPLANT
SUT PROLENE 8 0 BV175 6 (SUTURE) IMPLANT
SUT SILK 1 MH (SUTURE) IMPLANT
SUT SILK 2 0 SH (SUTURE) IMPLANT
SUT SILK 2 0 SH CR/8 (SUTURE) IMPLANT
SUT STEEL STERNAL CCS#1 18IN (SUTURE) IMPLANT
SUT VIC AB 1 CTX 36 (SUTURE) ×6
SUT VIC AB 1 CTX36XBRD ANBCTR (SUTURE) ×4 IMPLANT
SUT VIC AB 2-0 CT1 27 (SUTURE) ×2
SUT VIC AB 2-0 CT1 TAPERPNT 27 (SUTURE) IMPLANT
SUT VIC AB 2-0 CTX 27 (SUTURE) IMPLANT
SUT VIC AB 3-0 SH 27 (SUTURE)
SUT VIC AB 3-0 SH 27X BRD (SUTURE) IMPLANT
SUT VIC AB 3-0 X1 27 (SUTURE) IMPLANT
SYSTEM SAHARA CHEST DRAIN ATS (WOUND CARE) ×2 IMPLANT
TAPE CLOTH SURG 4X10 WHT LF (GAUZE/BANDAGES/DRESSINGS) IMPLANT
TAPE PAPER 2X10 WHT MICROPORE (GAUZE/BANDAGES/DRESSINGS) IMPLANT
TOWEL GREEN STERILE (TOWEL DISPOSABLE) ×2 IMPLANT
TOWEL GREEN STERILE FF (TOWEL DISPOSABLE) ×2 IMPLANT
TRAY FOLEY SLVR 16FR TEMP STAT (SET/KITS/TRAYS/PACK) ×2 IMPLANT
TUBING LAP HI FLOW INSUFFLATIO (TUBING) ×2 IMPLANT
UNDERPAD 30X36 HEAVY ABSORB (UNDERPADS AND DIAPERS) ×2 IMPLANT
WATER STERILE IRR 1000ML POUR (IV SOLUTION) ×4 IMPLANT

## 2022-12-13 NOTE — Brief Op Note (Signed)
12/13/2022  8:48 AM  PATIENT:  Erica Estrada  72 y.o. female  PRE-OPERATIVE DIAGNOSIS:  CORONARY ARTERY DISEASE  POST-OPERATIVE DIAGNOSIS:  CORONARY ARTERY DISEASE  PROCEDURE:  Procedure(s):  CORONARY ARTERY BYPASS GRAFTING x 3 -LIMA to LAD -SVG to PDA -SVG to DIAGONAL  TRANSESOPHAGEAL ECHOCARDIOGRAM (N/A)  ENDOSCOPIC HARVEST GREATER SAPHENOUS VEIN -Right leg Vein harvest time: 40 min Vein prep time: 20 min  SURGEON:  Surgeons and Role:    * Alleen Borne, MD - Primary  PHYSICIAN ASSISTANT: Lowella Dandy PA-C   ASSISTANTS: Virgilio Frees RNFA   ANESTHESIA:   general  EBL:  540 mL   BLOOD ADMINISTERED:   CC CELLSAVER  DRAINS:  Left and Right Pleural Chest Tubes, Mediastinal Chest Drains    LOCAL MEDICATIONS USED:  NONE  SPECIMEN:  No Specimen  DISPOSITION OF SPECIMEN:  N/A  COUNTS:  YES  TOURNIQUET:  * No tourniquets in log *  DICTATION: .Dragon Dictation  PLAN OF CARE: Admit to inpatient   PATIENT DISPOSITION:  ICU - intubated and hemodynamically stable.   Delay start of Pharmacological VTE agent (>24hrs) due to surgical blood loss or risk of bleeding: yes

## 2022-12-13 NOTE — Anesthesia Postprocedure Evaluation (Signed)
Anesthesia Post Note  Patient: Erica Estrada  Procedure(s) Performed: CORONARY ARTERY BYPASS GRAFTING (CABG) TIMES THREE UTILIZING LEFT INTERNAL MAMMARY ARTERY AND ENDOSCOPIC VEIN HARVEST RIGHT GREATER SAPHENOUS VEIN (Chest) TRANSESOPHAGEAL ECHOCARDIOGRAM     Patient location during evaluation: SICU Anesthesia Type: General Level of consciousness: sedated Pain management: pain level controlled Vital Signs Assessment: post-procedure vital signs reviewed and stable Respiratory status: patient remains intubated per anesthesia plan Cardiovascular status: stable Postop Assessment: no apparent nausea or vomiting Anesthetic complications: no   No notable events documented.  Last Vitals:  Vitals:   12/13/22 1720 12/13/22 1740  BP: (!) 157/75 122/72  Pulse: 82 90  Resp: (!) 27   Temp:    SpO2: 99% 100%    Last Pain:  Vitals:   12/13/22 0608  TempSrc:   PainSc: 0-No pain                 Collene Schlichter

## 2022-12-13 NOTE — Anesthesia Procedure Notes (Signed)
Central Venous Catheter Insertion Performed by: Collene Schlichter, MD, anesthesiologist Start/End10/05/2022 7:05 AM, 12/13/2022 7:10 AM Patient location: Pre-op. Preanesthetic checklist: patient identified, IV checked, site marked, risks and benefits discussed, surgical consent, monitors and equipment checked, pre-op evaluation and timeout performed Position: Trendelenburg Hand hygiene performed  and maximum sterile barriers used  Total catheter length 100. PA cath was placed.Swan type:thermodilution PA Cath depth:47 Procedure performed without using ultrasound guided technique. Attempts: 1 Patient tolerated the procedure well with no immediate complications.

## 2022-12-13 NOTE — Hospital Course (Addendum)
History of Present Illness:  Erica Estrada is a 72 year-old woman with history of type 2 diabetes, hypertension, hyperlipidemia, previous smoking, and coronary artery disease who was referred for consideration of coronary bypass surgery.  She had a Lexiscan Myoview study in 02/2021 which showed no significant ischemia.  A subsequent study on 10/09/2022 showed evidence of infarction and ischemia.  Left ventricular function was normal.  It was felt to be a very suboptimal study due to intense GI uptake at the border of the inferior wall.  There was felt that the anterior wall reversible defect may be due to artifact.  She subsequently underwent cardiac catheterization on 11/27/2022 showing severe three-vessel coronary disease with mildly elevated right heart and pulm artery pressures.  There was 70% mid LAD stenosis that was severely calcified.  There were 70% and 90% mid left circumflex stenoses.  The right coronary artery had moderate 40 to 50% mid vessel stenoses.  There is 80% distal stenosis before the posterior descending branch.  Echocardiogram on 11/27/2022 showed an ejection fraction of 55 to 60% with grade 1 diastolic dysfunction.  There is mild mitral regurgitation.  She was referred to Dr. Laneta Simmers for surgical evaluation at which time she reports having episodes of chest tightness and shortness of breath and fatigue have been occurring with exertion as well as occasionally at rest while sleeping.  She has been on Imdur.  It was recommended she undergo coronary bypass grafting for relief of her symptoms.  The risks and benefits of the procedure were explained to the patient and she was agreeable to proceed.  Hospital Course:  Erica Estrada presents to [facility] on 12/13/2022.  She was taken to the operating room and underwent Coronary Bypass Grafting x 3 utilizing LIMA to LAD, SVG to PDA, and SVG to Diagonal.  She also underwent endoscopic harvest greater saphenous vein from her right leg.   She tolerated the procedure without difficulty and was taken to the SICU in stable condition.  She was extubated the evening of surgery.  She did not require inotropic/pressor support post operatively.  She underwent removal of mediastinal chest tubes, arterial lines, and swan on POD #1.  She was transitioned off insulin drip to Levemir and sliding scale coverage.

## 2022-12-13 NOTE — Anesthesia Procedure Notes (Signed)
Procedure Name: Intubation Date/Time: 12/13/2022 7:36 AM  Performed by: Alease Medina, CRNAPre-anesthesia Checklist: Patient identified, Emergency Drugs available, Suction available and Patient being monitored Patient Re-evaluated:Patient Re-evaluated prior to induction Oxygen Delivery Method: Circle system utilized Preoxygenation: Pre-oxygenation with 100% oxygen Induction Type: IV induction Ventilation: Mask ventilation without difficulty Laryngoscope Size: Mac and 3 Grade View: Grade I Tube type: Oral Tube size: 8.0 mm Number of attempts: 1 Airway Equipment and Method: Stylet and Oral airway Placement Confirmation: ETT inserted through vocal cords under direct vision, positive ETCO2 and breath sounds checked- equal and bilateral Secured at: 21 cm Tube secured with: Tape Dental Injury: Teeth and Oropharynx as per pre-operative assessment

## 2022-12-13 NOTE — Anesthesia Procedure Notes (Signed)
Arterial Line Insertion Start/End10/05/2022 7:10 AM, 12/13/2022 7:20 AM Performed by: Collene Schlichter, MD, anesthesiologist  Patient location: Pre-op. Preanesthetic checklist: patient identified, IV checked, site marked, risks and benefits discussed, surgical consent, monitors and equipment checked, pre-op evaluation, timeout performed and anesthesia consent Lidocaine 1% used for infiltration and patient sedated Right, radial was placed Catheter size: 20 G Hand hygiene performed  and maximum sterile barriers used   Attempts: 1 Procedure performed using ultrasound guided technique. Ultrasound Notes:anatomy identified, needle tip was noted to be adjacent to the nerve/plexus identified, no ultrasound evidence of intravascular and/or intraneural injection and image(s) printed for medical record Following insertion, dressing applied and Biopatch. Post procedure assessment: normal and unchanged  Post procedure complications: unsuccessful attempts and second provider assisted. Additional procedure comments: Attempt on left radial by CRNA, unsuccessful. Attempt on right by MDA with success.Marland Kitchen

## 2022-12-13 NOTE — Procedures (Signed)
Extubation Procedure Note  Patient Details:   Name: Erica Estrada DOB: [birth year] MRN: [id number]   Airway Documentation:    Vent end date: 12/13/22 Vent end time: 1829   Evaluation  O2 sats: stable throughout Complications: No apparent complications Patient did tolerate procedure well. Bilateral Breath Sounds: Clear   Yes  Patient was extubated to a 4L [employer] without any complications, dyspnea or stridor noted. NIF: -35, VC: 700, positive cuff leak prior to extubation. Patient was instructed on IS by RN.   Carlynn Spry 12/13/2022, 6:29 PM

## 2022-12-13 NOTE — Interval H&P Note (Signed)
History and Physical Interval Note:  12/13/2022 6:32 AM  Erica Estrada  has presented today for surgery, with the diagnosis of CAD.  The various methods of treatment have been discussed with the patient and family. After consideration of risks, benefits and other options for treatment, the patient has consented to  Procedure(s): CORONARY ARTERY BYPASS GRAFTING (CABG) (N/A) TRANSESOPHAGEAL ECHOCARDIOGRAM (N/A) as a surgical intervention.  The patient's history has been reviewed, patient examined, no change in status, stable for surgery.  I have reviewed the patient's chart and labs.  Questions were answered to the patient's satisfaction.     Alleen Borne

## 2022-12-13 NOTE — Transfer of Care (Signed)
Immediate Anesthesia Transfer of Care Note  Patient: Erica Estrada  Procedure(s) Performed: CORONARY ARTERY BYPASS GRAFTING (CABG) TIMES THREE UTILIZING LEFT INTERNAL MAMMARY ARTERY AND ENDOSCOPIC VEIN HARVEST RIGHT GREATER SAPHENOUS VEIN (Chest) TRANSESOPHAGEAL ECHOCARDIOGRAM  Patient Location: ICU  Anesthesia Type:General  Level of Consciousness: sedated and Patient remains intubated per anesthesia plan  Airway & Oxygen Therapy: Patient remains intubated per anesthesia plan and Patient placed on Ventilator (see vital sign flow sheet for setting)  Post-op Assessment: Report given to RN and Post -op Vital signs reviewed and stable  Post vital signs: Reviewed and stable  Last Vitals:  Vitals Value Taken Time  BP 105/53   Temp    Pulse 80 12/13/22 1258  Resp 16 12/13/22 1258  SpO2 100 % 12/13/22 1258  Vitals shown include unfiled device data.  Last Pain:  Vitals:   12/13/22 1610  TempSrc:   PainSc: 0-No pain      Patients Stated Pain Goal: 0 (12/13/22 9604)  Complications: No notable events documented.  Patient transported to ICU with standard monitors (HR, BP, SPO2, RR) and emergency drugs/equipment. Controlled ventilation maintained via ambu bag. Report given to bedside RN and respiratory therapist. Pt connected to ICU monitor and ventilator. All questions answered and vital signs stable before leaving

## 2022-12-13 NOTE — Progress Notes (Signed)
      [address]       [name] [address]             [phone]      S/p CABG x 3  Intubated  BP (!) 157/75   Pulse 82   Temp 99 F (37.2 C)   Resp (!) 27   Ht 5\' 5"  (1.651 m)   Wt 74.4 kg   SpO2 99%   BMI 27.29 kg/m  On NTG for hypertension   Intake/Output Summary (Last 24 hours) at 12/13/2022 1757 Last data filed at 12/13/2022 1550 Gross per 24 hour  Intake 3212.44 ml  Output 2455 ml  Net 757.44 ml   Minimal CT output  CBG well controleld Hct 26 K 3.1- receiving 5th run of KCL now  [place] C. Dorris Fetch, MD [facility] [phone]

## 2022-12-13 NOTE — Anesthesia Procedure Notes (Signed)
Central Venous Catheter Insertion Performed by: Collene Schlichter, MD, anesthesiologist Start/End10/05/2022 6:55 AM, 12/13/2022 7:05 AM Patient location: Pre-op. Preanesthetic checklist: patient identified, IV checked, site marked, risks and benefits discussed, surgical consent, monitors and equipment checked, pre-op evaluation, timeout performed and anesthesia consent Position: Trendelenburg Lidocaine 1% used for infiltration and patient sedated Hand hygiene performed , maximum sterile barriers used  and Seldinger technique used Catheter size: 8.5 Fr Central line was placed.Sheath introducer Procedure performed using ultrasound guided technique. Ultrasound Notes:anatomy identified, needle tip was noted to be adjacent to the nerve/plexus identified, no ultrasound evidence of intravascular and/or intraneural injection and image(s) printed for medical record Attempts: 1 Following insertion, line sutured, dressing applied and Biopatch. Post procedure assessment: free fluid flow, blood return through all ports and no air  Patient tolerated the procedure well with no immediate complications.

## 2022-12-13 NOTE — Op Note (Signed)
CARDIOVASCULAR SURGERY OPERATIVE NOTE  12/13/2022  Surgeon:  Alleen Borne, MD  First Assistant: Lowella Dandy,  PA-C:   An experienced assistant was required given the complexity of this surgery and the standard of surgical care. The assistant was needed for endoscopic vein harvest, exposure, dissection, suctioning, retraction of delicate tissues and sutures, instrument exchange and for overall help during this procedure.   Preoperative Diagnosis:  Severe multi-vessel coronary artery disease   Postoperative Diagnosis:  Same   Procedure:  Median Sternotomy Extracorporeal circulation 3.   Coronary artery bypass grafting x 3  Left internal mammary artery graft to the LAD SVG to diagonal SVG to PDA  4.   Endoscopic vein harvest from the right leg   Anesthesia:  General Endotracheal   Clinical History/Surgical Indication:  This 72 year old diabetic woman has severe three-vessel coronary disease with worsening symptoms of chest discomfort, shortness of breath, and fatigue. I agree that coronary artery bypass graft surgery is the best treatment for her for symptom relief and to prevent further ischemia and infarction. I reviewed the cardiac catheterization images with her and her family and answered their questions. I discussed the operative procedure with the patient and family including alternatives, benefits and risks; including but not limited to bleeding, blood transfusion, infection, stroke, myocardial infarction, graft failure, heart block requiring a permanent pacemaker, organ dysfunction, and death. [name] understands and agrees to proceed   Preparation:  The patient was seen in the preoperative holding area and the correct patient, correct operation were confirmed with the patient after reviewing the medical record and catheterization. The consent was signed by me.  Preoperative antibiotics were given. A pulmonary arterial line and radial arterial line were placed by the anesthesia team. The patient was taken back to the operating room and positioned supine on the operating room table. After being placed under general endotracheal anesthesia by the anesthesia team a foley catheter was placed. The neck, chest, abdomen, and both legs were prepped with betadine soap and solution and draped in the usual sterile manner. A surgical time-out was taken and the correct patient and operative procedure were confirmed with the nursing and anesthesia staff.   Cardiopulmonary Bypass:  A median sternotomy was performed. The pericardium was opened in the midline. Right ventricular function appeared normal. The ascending aorta was of normal size and had some palpable plaque present anteriorly in the mid-portion. There were no contraindications to aortic cannulation or cross-clamping. The patient was fully systemically heparinized and the ACT was maintained > 400 sec. The proximal aortic arch was cannulated with a 20 F aortic cannula for arterial inflow. Venous cannulation was performed via the right atrial appendage using a two-staged venous cannula. An antegrade cardioplegia/vent cannula was inserted into the mid-ascending aorta. Aortic occlusion was performed with a single cross-clamp. Systemic cooling to 32 degrees Centigrade and topical cooling of the heart with iced saline were used. Hyperkalemic antegrade cold blood cardioplegia was used to induce diastolic arrest and was then given at about 20 minute intervals throughout the period of arrest to maintain myocardial temperature at or below 10 degrees centigrade. A temperature probe was inserted into the interventricular septum and an insulating pad was placed in the pericardium.   Left internal mammary artery harvest:  The left side of the sternum was retracted using the Rultract retractor. The left internal mammary artery was  harvested as a pedicle graft. All side branches were clipped. It was a medium-sized vessel of good quality with excellent blood flow. It  was ligated distally and divided. It was sprayed with topical papaverine solution to prevent vasospasm.   Endoscopic vein harvest: Performed by Lowella Dandy, PA-C  The right greater saphenous vein was harvested endoscopically through a 2 cm incision medial to the right knee. It was harvested from the upper thigh to below the knee. It was a medium-sized vein of good quality. The side branches were all ligated with 4-0 silk ties.    Coronary arteries:  The coronary arteries were examined.  LAD:  diffusely diseased. Diagonal was a medium caliber vessel that was severely diseased proximally extending into the muscle. I traced it into the muscle and it became a little softer and graftable but not normal LCX:  OM diffusely diseased but no stenosis on cath. The distal OM/PL branches were tiny and diffusely diseased. RCA:  diffusely diseased extending into the proximal PDA. The PDA was soft proximally but diffusely diseased.   Grafts: All grafts performed by me and assisted by Lowella Dandy, PA-C  LIMA to the LAD: 1.75 mm. It was sewn end to side using 8-0 prolene continuous suture. SVG to diagonal:  1.6 mm. It was sewn end to side using 7-0 prolene continuous suture. SVG to PDA:  1.6 mm. It was sewn end to side using 7-0 prolene continuous suture.   The proximal vein graft anastomoses were performed to the mid-ascending aorta using continuous 6-0 prolene suture. Graft markers were placed around the proximal anastomoses.   Completion:  The patient was rewarmed to 37 degrees Centigrade. The clamp was removed from the LIMA pedicle and there was rapid warming of the septum and return of ventricular fibrillation. The crossclamp was removed with a time of 77 minutes. There was spontaneous return of sinus rhythm. The distal and proximal anastomoses were checked for  hemostasis. The position of the grafts was satisfactory. Two temporary epicardial pacing wires were placed on the right atrium and two on the right ventricle. The patient was weaned from CPB without difficulty on no inotropes. CPB time was 95 minutes. Cardiac output was 5 LPM. TEE showed normal LV systolic function. Heparin was fully reversed with protamine and the aortic and venous cannulas removed. Hemostasis was achieved. Mediastinal and bilateral pleural drainage tubes were placed. The sternum was closed with #6 stainless steel wires. The fascia was closed with continuous # 1 vicryl suture. The subcutaneous tissue was closed with 2-0 vicryl continuous suture. The skin was closed with 3-0 vicryl subcuticular suture. All sponge, needle, and instrument counts were reported correct at the end of the case. Dry sterile dressings were placed over the incisions and around the chest tubes which were connected to pleurevac suction. The patient was then transported to the surgical intensive care unit in stable condition.

## 2022-12-14 ENCOUNTER — Inpatient Hospital Stay (HOSPITAL_COMMUNITY): Payer: Medicare HMO

## 2022-12-14 ENCOUNTER — Encounter (HOSPITAL_COMMUNITY): Payer: Self-pay | Admitting: Surgery

## 2022-12-14 LAB — BASIC METABOLIC PANEL
Anion gap: 8 (ref 5–15)
Anion gap: 9 (ref 5–15)
BUN: 8 mg/dL (ref 8–23)
BUN: 12 mg/dL (ref 8–23)
CO2: 25 mmol/L (ref 22–32)
CO2: 26 mmol/L (ref 22–32)
Calcium: 8 mg/dL — ABNORMAL LOW (ref 8.9–10.3)
Calcium: 8.7 mg/dL — ABNORMAL LOW (ref 8.9–10.3)
Chloride: 105 mmol/L (ref 98–111)
Chloride: 104 mmol/L (ref 98–111)
Creatinine, Ser: 0.65 mg/dL (ref 0.44–1.00)
Creatinine, Ser: 0.88 mg/dL (ref 0.44–1.00)
GFR, Estimated: >60 mL/min (ref >60)
GFR, Estimated: >60 mL/min (ref >60)
Glucose, Bld: 116 mg/dL — ABNORMAL HIGH (ref 70–99)
Glucose, Bld: 198 mg/dL — ABNORMAL HIGH (ref 70–99)
Potassium: 4 mmol/L (ref 3.5–5.1)
Potassium: 4.3 mmol/L (ref 3.5–5.1)
Sodium: 138 mmol/L (ref 135–145)
Sodium: 139 mmol/L (ref 135–145)

## 2022-12-14 LAB — CBC
HCT: 28.3 % — ABNORMAL LOW (ref 36.0–46.0)
HCT: 31.9 % — ABNORMAL LOW (ref 36.0–46.0)
Hemoglobin: 9.1 g/dL — ABNORMAL LOW (ref 12.0–15.0)
Hemoglobin: 9.9 g/dL — ABNORMAL LOW (ref 12.0–15.0)
MCH: 29.1 pg (ref 26.0–34.0)
MCH: 27.7 pg (ref 26.0–34.0)
MCHC: 32.2 g/dL (ref 30.0–36.0)
MCHC: 31 g/dL (ref 30.0–36.0)
MCV: 90.4 fL (ref 80.0–100.0)
MCV: 89.4 fL (ref 80.0–100.0)
Platelets: 196 10*3/uL (ref 150–400)
Platelets: 228 10*3/uL (ref 150–400)
RBC: 3.13 MIL/uL — ABNORMAL LOW (ref 3.87–5.11)
RBC: 3.57 MIL/uL — ABNORMAL LOW (ref 3.87–5.11)
RDW: 13.4 % (ref 11.5–15.5)
RDW: 13.6 % (ref 11.5–15.5)
WBC: 13.1 10*3/uL — ABNORMAL HIGH (ref 4.0–10.5)
WBC: 16.7 10*3/uL — ABNORMAL HIGH (ref 4.0–10.5)
nRBC: 0 % (ref 0.0–0.2)
nRBC: 0 % (ref 0.0–0.2)

## 2022-12-14 LAB — LIPID PANEL
Cholesterol: 94 mg/dL (ref 0–200)
HDL: 35 mg/dL — ABNORMAL LOW (ref >40)
LDL Cholesterol: 47 mg/dL (ref 0–99)
Total CHOL/HDL Ratio: 2.7 {ratio}
Triglycerides: 58 mg/dL (ref <150)
VLDL: 12 mg/dL (ref 0–40)

## 2022-12-14 LAB — GLUCOSE, CAPILLARY
Glucose-Capillary: 104 mg/dL — ABNORMAL HIGH (ref 70–99)
Glucose-Capillary: 109 mg/dL — ABNORMAL HIGH (ref 70–99)
Glucose-Capillary: 116 mg/dL — ABNORMAL HIGH (ref 70–99)
Glucose-Capillary: 119 mg/dL — ABNORMAL HIGH (ref 70–99)
Glucose-Capillary: 131 mg/dL — ABNORMAL HIGH (ref 70–99)
Glucose-Capillary: 135 mg/dL — ABNORMAL HIGH (ref 70–99)
Glucose-Capillary: 137 mg/dL — ABNORMAL HIGH (ref 70–99)
Glucose-Capillary: 145 mg/dL — ABNORMAL HIGH (ref 70–99)
Glucose-Capillary: 160 mg/dL — ABNORMAL HIGH (ref 70–99)
Glucose-Capillary: 195 mg/dL — ABNORMAL HIGH (ref 70–99)
Glucose-Capillary: 94 mg/dL (ref 70–99)

## 2022-12-14 LAB — MAGNESIUM
Magnesium: 2.8 mg/dL — ABNORMAL HIGH (ref 1.7–2.4)
Magnesium: 2.7 mg/dL — ABNORMAL HIGH (ref 1.7–2.4)

## 2022-12-14 MED ORDER — ENOXAPARIN SODIUM 40 MG/0.4ML IJ SOSY: 40.0000 mg | PREFILLED_SYRINGE | Freq: Every day | INTRAMUSCULAR | Status: DC

## 2022-12-14 MED ORDER — PROMETHAZINE (PHENERGAN) 6.25MG IN NS 50ML IVPB
6.2500 mg | Freq: Four times a day (QID) | INTRAVENOUS | Status: DC | PRN
  Filled 2022-12-14: qty 50

## 2022-12-14 MED ORDER — GABAPENTIN 300 MG PO CAPS
300.0000 mg | ORAL_CAPSULE | Freq: Every day | ORAL | Status: DC
  Administered 2022-12-14 – 2022-12-19 (×6): 300 mg via ORAL
  Filled 2022-12-14 (×6): qty 1

## 2022-12-14 MED ORDER — OXYCODONE HCL 5 MG PO TABS
5.0000 mg | ORAL_TABLET | ORAL | Status: DC | PRN
  Administered 2022-12-14 – 2022-12-16 (×7): 10 mg via ORAL
  Administered 2022-12-16: 5 mg via ORAL
  Administered 2022-12-16 – 2022-12-19 (×7): 10 mg via ORAL
  Filled 2022-12-14 (×15): qty 2
  Filled 2022-12-14: qty 1

## 2022-12-14 MED ORDER — FOLIC ACID 1 MG PO TABS
1.0000 mg | ORAL_TABLET | Freq: Every day | ORAL | Status: DC
  Administered 2022-12-14 – 2022-12-16 (×3): 1 mg via ORAL
  Filled 2022-12-14 (×3): qty 1

## 2022-12-14 MED ORDER — FUROSEMIDE 10 MG/ML IJ SOLN
40.0000 mg | Freq: Two times a day (BID) | INTRAMUSCULAR | Status: AC
  Administered 2022-12-14 (×2): 40 mg via INTRAVENOUS
  Filled 2022-12-14 (×2): qty 4

## 2022-12-14 MED ORDER — ACETAMINOPHEN 160 MG/5ML PO SOLN
1000.0000 mg | Freq: Four times a day (QID) | ORAL | Status: AC
  Administered 2022-12-14 – 2022-12-15 (×2): 1000 mg
  Filled 2022-12-14 (×2): qty 40.6

## 2022-12-14 MED ORDER — ENOXAPARIN SODIUM 40 MG/0.4ML IJ SOSY
40.0000 mg | PREFILLED_SYRINGE | Freq: Every day | INTRAMUSCULAR | Status: DC
  Administered 2022-12-14 – 2022-12-19 (×6): 40 mg via SUBCUTANEOUS
  Filled 2022-12-14 (×6): qty 0.4

## 2022-12-14 MED ORDER — INSULIN ASPART 100 UNIT/ML IJ SOLN
0.0000 [IU] | INTRAMUSCULAR | Status: DC
  Administered 2022-12-14: 2 [IU] via SUBCUTANEOUS
  Administered 2022-12-14: 4 [IU] via SUBCUTANEOUS
  Administered 2022-12-15: 2 [IU] via SUBCUTANEOUS
  Administered 2022-12-15 (×2): 4 [IU] via SUBCUTANEOUS
  Administered 2022-12-15: 2 [IU] via SUBCUTANEOUS
  Administered 2022-12-15 (×2): 4 [IU] via SUBCUTANEOUS
  Administered 2022-12-16: 2 [IU] via SUBCUTANEOUS
  Administered 2022-12-16: 4 [IU] via SUBCUTANEOUS
  Administered 2022-12-16: 2 [IU] via SUBCUTANEOUS
  Administered 2022-12-16: 4 [IU] via SUBCUTANEOUS

## 2022-12-14 MED ORDER — INSULIN DETEMIR 100 UNIT/ML ~~LOC~~ SOLN
30.0000 [IU] | Freq: Every day | SUBCUTANEOUS | Status: DC
  Administered 2022-12-14 – 2022-12-16 (×3): 30 [IU] via SUBCUTANEOUS
  Filled 2022-12-14 (×4): qty 0.3

## 2022-12-14 MED ORDER — POTASSIUM CHLORIDE CRYS ER 20 MEQ PO TBCR
20.0000 meq | EXTENDED_RELEASE_TABLET | Freq: Three times a day (TID) | ORAL | Status: AC
  Administered 2022-12-14 (×3): 20 meq via ORAL
  Filled 2022-12-14 (×3): qty 1

## 2022-12-14 MED ORDER — TRAMADOL HCL 50 MG PO TABS
50.0000 mg | ORAL_TABLET | ORAL | Status: DC | PRN
  Administered 2022-12-14 – 2022-12-18 (×8): 100 mg via ORAL
  Filled 2022-12-14 (×8): qty 2

## 2022-12-14 MED ORDER — METOPROLOL TARTRATE 25 MG PO TABS
25.0000 mg | ORAL_TABLET | Freq: Two times a day (BID) | ORAL | Status: DC
  Administered 2022-12-14 – 2022-12-20 (×13): 25 mg via ORAL
  Filled 2022-12-14 (×13): qty 1

## 2022-12-14 MED ORDER — ATORVASTATIN CALCIUM 80 MG PO TABS
80.0000 mg | ORAL_TABLET | Freq: Every day | ORAL | Status: DC
  Administered 2022-12-14 – 2022-12-20 (×7): 80 mg via ORAL
  Filled 2022-12-14 (×7): qty 1

## 2022-12-14 MED ORDER — METHIMAZOLE 2.5 MG HALF TABLET
2.5000 mg | ORAL_TABLET | Freq: Every day | ORAL | Status: DC
  Administered 2022-12-14 – 2022-12-20 (×7): 2.5 mg via ORAL
  Filled 2022-12-14 (×7): qty 1

## 2022-12-14 MED ORDER — ACETAMINOPHEN 500 MG PO TABS
1000.0000 mg | ORAL_TABLET | Freq: Four times a day (QID) | ORAL | Status: AC
  Administered 2022-12-14 – 2022-12-18 (×16): 1000 mg via ORAL
  Filled 2022-12-14 (×16): qty 2

## 2022-12-14 MED ORDER — TRAZODONE HCL 50 MG PO TABS
50.0000 mg | ORAL_TABLET | Freq: Every day | ORAL | Status: DC
  Administered 2022-12-14 – 2022-12-19 (×6): 50 mg via ORAL
  Filled 2022-12-14 (×6): qty 1

## 2022-12-14 MED ORDER — METOPROLOL TARTRATE 25 MG/10 ML ORAL SUSPENSION
25.0000 mg | Freq: Two times a day (BID) | ORAL | Status: DC
  Filled 2022-12-14 (×7): qty 10

## 2022-12-14 MED FILL — Thrombin (Recombinant) For Soln 20000 Unit: CUTANEOUS | Qty: 1 | Status: AC

## 2022-12-14 MED FILL — Gelatin Absorbable MT Powder: OROMUCOSAL | Qty: 1 | Status: AC

## 2022-12-14 NOTE — TOC Initial Note (Signed)
Transition of Care [facility]) - Initial/Assessment Note    Patient Details  Name: Erica Estrada MRN: [id number] Date of Birth: 12-13-1950  Transition of Care [facility]) CM/SW Contact:    Elliot Cousin, RN Phone Number: [phone] 12/14/2022, 4:59 PM  Clinical Narrative:  CM spoke to pt and sister at bedside. Pt lives at home with husband and dtr. Pt states she has RW and cane at home. Pt is interested in IP rehab. Will need PT/OT recommendations for home.                 Expected Discharge Plan: Home w Home Health Services Barriers to Discharge: Continued Medical Work up   Patient Goals and CMS Choice Patient states their goals for this hospitalization and ongoing recovery are:: wants to get better CMS Medicare.gov Compare Post Acute Care list provided to:: Patient Choice offered to / list presented to : Patient      Expected Discharge Plan and Services   Discharge Planning Services: CM Consult Post Acute Care Choice: IP Rehab Living arrangements for the past 2 months: Single Family Home                                      Prior Living Arrangements/Services Living arrangements for the past 2 months: Single Family Home Lives with:: Spouse, Adult Children Patient language and need for interpreter reviewed:: Yes Do you feel safe going back to the place where you live?: Yes      Need for Family Participation in Patient Care: Yes (Comment) Care giver support system in place?: Yes (comment) Current home services: DME (rolling walker, cane) Criminal Activity/Legal Involvement Pertinent to Current Situation/Hospitalization: No - Comment as needed  Activities of Daily Living      Permission Sought/Granted Permission sought to share information with : Case Manager, Family Supports, PCP Permission granted to share information with : Yes, Verbal Permission Granted  Share Information with NAME: De Blanch  Permission granted to share info w AGENCY: Home Health, IP  rehab  Permission granted to share info w Relationship: De Blanch  Permission granted to share info w Contact Information: [phone]  Emotional Assessment Appearance:: Appears stated age Attitude/Demeanor/Rapport: Lethargic Affect (typically observed): Accepting Orientation: : Oriented to Self, Oriented to Place, Oriented to  Time, Oriented to Situation   Psych Involvement: No (comment)  Admission diagnosis:  S/P CABG x 3 [Z95.1] Patient Active Problem List   Diagnosis Date Noted   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   Abnormal stress test 11/27/2022   Shortness of breath 11/27/2022   Anemia 01/22/2021   Chest pain of uncertain etiology 01/21/2021   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   Posttraumatic stress disorder 12/23/2018   Severe recurrent major depression without psychotic features (HCC) 11/10/2017   Abdominal pain, chronic, generalized 09/20/2016   Hepatic steatosis 08/14/2016   Multiple thyroid nodules 01/12/2016   Herniation of lumbar intervertebral disc with radiculopathy 05/20/2013   Degenerative disc disease 07/18/2011   Allergic rhinitis 06/21/2011   GERD (gastroesophageal reflux disease) 04/28/2011   Glaucoma suspect with open angle 04/17/2011   Depression 12/26/2010   Vision impairment 12/26/2010   Hypertension 12/26/2010   Hyperlipidemia with target LDL less than 70 12/26/2010   H/O: substance abuse (HCC) 12/26/2010   Diabetes mellitus, type 2 (HCC) 12/26/2010   Cervical stenosis of spine 12/26/2010   PCP:  [name], MD Pharmacy:   [facility]  Pharmacy - [place], [place] - [address] [place] [address] Phone: [phone] Fax: [phone]  [facility] - [place], [place] - [address] [place] [address] Phone: [phone] Fax: [phone]     Social Determinants of Health (SDOH) Social History: SDOH Screenings   Alcohol Screen: Medium Risk (11/10/2017)  Tobacco Use: Medium Risk  (12/13/2022)   SDOH Interventions:     Readmission Risk Interventions     No data to display

## 2022-12-14 NOTE — Discharge Instructions (Signed)

## 2022-12-14 NOTE — Progress Notes (Signed)
      [address]       [name] [address]             [phone]      1 Day Post-Op Procedure(s) (LRB): CORONARY ARTERY BYPASS GRAFTING (CABG) TIMES THREE UTILIZING LEFT INTERNAL MAMMARY ARTERY AND ENDOSCOPIC VEIN HARVEST RIGHT GREATER SAPHENOUS VEIN (N/A) TRANSESOPHAGEAL ECHOCARDIOGRAM (N/A)  Subjective:  Patient moaning.  Nods head that pain is controlled.  Having nausea.  Objective: Vital signs in last 24 hours: Temp:  [97.2 F (36.2 C)-100.6 F (38.1 C)] 100 F (37.8 C) (10/04 0830) Pulse Rate:  [79-102] 97 (10/04 0830) Cardiac Rhythm: Normal sinus rhythm (10/04 0800) Resp:  [8-34] 14 (10/04 0830) BP: (85-186)/(53-86) 130/86 (10/04 0830) SpO2:  [91 %-100 %] 97 % (10/04 0830) Arterial Line BP: (89-206)/(45-74) 164/59 (10/04 0830) FiO2 (%):  [4 %-50 %] 40 % (10/03 1829) Weight:  [77.6 kg] 77.6 kg (10/04 0500)  Hemodynamic parameters for last 24 hours: PAP: (25-51)/(13-31) 35/15 CO:  [2.9 L/min-5.8 L/min] 3.9 L/min CI:  [1.61 L/min/m2-3.2 L/min/m2] 2.2 L/min/m2  Intake/Output from previous day: 10/03 0701 - 10/04 0700 In: 4169.5 [I.V.:1669; Blood:270; IV Piggyback:2230.4] Out: 3547 [Urine:2537; Blood:540; Chest Tube:470] Intake/Output this shift: Total I/O In: 129.7 [I.V.:72.8; IV Piggyback:56.8] Out: 105 [Urine:75; Chest Tube:30]  General appearance: alert, cooperative, and no distress Heart: regular rate and rhythm Lungs: clear to auscultation bilaterally Abdomen: soft, non-tender; bowel sounds normal; no masses,  no organomegaly Extremities: extremities normal, atraumatic, no cyanosis or edema Wound: clean and dry EVH sites, will leave sternal dressing in place for 72 hrs  Lab Results: Recent Labs    12/13/22 1914 12/13/22 1920 12/14/22 0438  WBC 15.9*  --  13.1*  HGB 9.3* 9.9* 9.1*  HCT 28.8* 29.0* 28.3*  PLT 207  --  196   BMET:  Recent Labs    12/13/22 1914 12/13/22 1920 12/14/22 0438  NA 140 143 138  K 3.8 3.9 4.0  CL  109  --  105  CO2 24  --  25  GLUCOSE 141*  --  116*  BUN 7*  --  8  CREATININE 0.59  --  0.65  CALCIUM 7.5*  --  8.0*    PT/INR:  Recent Labs    12/13/22 1304  LABPROT 15.5*  INR 1.2   ABG    Component Value Date/Time   PHART 7.340 (L) 12/13/2022 1920   HCO3 24.4 12/13/2022 1920   TCO2 26 12/13/2022 1920   ACIDBASEDEF 1.0 12/13/2022 1920   O2SAT 96 12/13/2022 1920   CBG (last 3)  Recent Labs    12/14/22 0433 12/14/22 0531 12/14/22 0647  GLUCAP 104* 94 131*    Assessment/Plan: S/P Procedure(s) (LRB): CORONARY ARTERY BYPASS GRAFTING (CABG) TIMES THREE UTILIZING LEFT INTERNAL MAMMARY ARTERY AND ENDOSCOPIC VEIN HARVEST RIGHT GREATER SAPHENOUS VEIN (N/A) TRANSESOPHAGEAL ECHOCARDIOGRAM (N/A)  CV- NSR, no drips- will start low dose Lopressor today Pulm- CXR with congestion, small left pleural effusion, CT is output is low Renal-creatinine is normal at 0.65, weight is elevated. IV lasix is ordered Expected post operative blood loss anemia, mild Hgb is 9.1 CBGs controlled, patient is a diabetic will transition off drip to SSIP and Levemir  Stable, nausea treat with prn anti-emetics, POD #1 progression orders   LOS: 1 day    Lowella Dandy, PA-C 12/14/2022

## 2022-12-15 ENCOUNTER — Inpatient Hospital Stay (HOSPITAL_COMMUNITY): Payer: Medicare HMO

## 2022-12-15 LAB — BASIC METABOLIC PANEL
Anion gap: 8 (ref 5–15)
BUN: 16 mg/dL (ref 8–23)
CO2: 26 mmol/L (ref 22–32)
Calcium: 8.5 mg/dL — ABNORMAL LOW (ref 8.9–10.3)
Chloride: 105 mmol/L (ref 98–111)
Creatinine, Ser: 0.94 mg/dL (ref 0.44–1.00)
GFR, Estimated: >60 mL/min (ref >60)
Glucose, Bld: 142 mg/dL — ABNORMAL HIGH (ref 70–99)
Potassium: 4.5 mmol/L (ref 3.5–5.1)
Sodium: 139 mmol/L (ref 135–145)

## 2022-12-15 LAB — GLUCOSE, CAPILLARY
Glucose-Capillary: 137 mg/dL — ABNORMAL HIGH (ref 70–99)
Glucose-Capillary: 140 mg/dL — ABNORMAL HIGH (ref 70–99)
Glucose-Capillary: 167 mg/dL — ABNORMAL HIGH (ref 70–99)
Glucose-Capillary: 170 mg/dL — ABNORMAL HIGH (ref 70–99)
Glucose-Capillary: 173 mg/dL — ABNORMAL HIGH (ref 70–99)
Glucose-Capillary: 177 mg/dL — ABNORMAL HIGH (ref 70–99)
Glucose-Capillary: 198 mg/dL — ABNORMAL HIGH (ref 70–99)
Glucose-Capillary: 76 mg/dL (ref 70–99)

## 2022-12-15 LAB — CBC
HCT: 28.2 % — ABNORMAL LOW (ref 36.0–46.0)
Hemoglobin: 8.6 g/dL — ABNORMAL LOW (ref 12.0–15.0)
MCH: 27.8 pg (ref 26.0–34.0)
MCHC: 30.5 g/dL (ref 30.0–36.0)
MCV: 91.3 fL (ref 80.0–100.0)
Platelets: 168 10*3/uL (ref 150–400)
RBC: 3.09 MIL/uL — ABNORMAL LOW (ref 3.87–5.11)
RDW: 13.6 % (ref 11.5–15.5)
WBC: 12.8 10*3/uL — ABNORMAL HIGH (ref 4.0–10.5)
nRBC: 0 % (ref 0.0–0.2)

## 2022-12-15 MED ORDER — CYCLOBENZAPRINE HCL 10 MG PO TABS
10.0000 mg | ORAL_TABLET | Freq: Three times a day (TID) | ORAL | Status: DC
  Administered 2022-12-15 – 2022-12-20 (×14): 10 mg via ORAL
  Filled 2022-12-15 (×14): qty 1

## 2022-12-15 NOTE — Progress Notes (Signed)
      [address]       [place] [address]             [phone]                 2 Days Post-Op Procedure(s) (LRB): CORONARY ARTERY BYPASS GRAFTING (CABG) TIMES THREE UTILIZING LEFT INTERNAL MAMMARY ARTERY AND ENDOSCOPIC VEIN HARVEST RIGHT GREATER SAPHENOUS VEIN (N/A) TRANSESOPHAGEAL ECHOCARDIOGRAM (N/A)   Events: No events _______________________________________________________________ Vitals: BP (!) 99/49 (BP Location: Left Arm)   Pulse 85   Temp 98.3 F (36.8 C) (Oral)   Resp (!) 23   Ht 5\' 5"  (1.651 m)   Wt 77 kg   SpO2 99%   BMI 28.25 kg/m  Filed Weights   12/13/22 0550 12/14/22 0500 12/15/22 0500  Weight: 74.4 kg 77.6 kg 77 kg     - Neuro: alert NAD  - Cardiovascular: sinus  Drips: none.      - Pulm: EWOB    ABG    Component Value Date/Time   PHART 7.340 (L) 12/13/2022 1920   PCO2ART 45.4 12/13/2022 1920   PO2ART 89 12/13/2022 1920   HCO3 24.4 12/13/2022 1920   TCO2 26 12/13/2022 1920   ACIDBASEDEF 1.0 12/13/2022 1920   O2SAT 96 12/13/2022 1920    - Abd: ND - Extremity: trace edema  .Intake/Output      10/04 0701 10/05 0700 10/05 0701 10/06 0700   I.V. (mL/kg) 165.4 (2.1)    Blood     IV Piggyback 336.4    Total Intake(mL/kg) 501.8 (6.5)    Urine (mL/kg/hr) 1130 (0.6)    Blood     Chest Tube 230    Total Output 1360    Net -858.2            _______________________________________________________________ Labs:    Latest Ref Rng & Units 12/15/2022    3:58 AM 12/14/2022    4:10 PM 12/14/2022    4:38 AM  CBC  WBC 4.0 - 10.5 K/uL 12.8  16.7  13.1   Hemoglobin 12.0 - 15.0 g/dL 8.6  9.9  9.1   Hematocrit 36.0 - 46.0 % 28.2  31.9  28.3   Platelets 150 - 400 K/uL 168  228  196       Latest Ref Rng & Units 12/15/2022    3:58 AM 12/14/2022    4:10 PM 12/14/2022    4:38 AM  CMP  Glucose 70 - 99 mg/dL 914  782  956   BUN 8 - 23 mg/dL 16  12  8    Creatinine 0.44 - 1.00 mg/dL 2.13  0.86  5.78   Sodium 135 - 145  mmol/L 139  139  138   Potassium 3.5 - 5.1 mmol/L 4.5  4.3  4.0   Chloride 98 - 111 mmol/L 105  104  105   CO2 22 - 32 mmol/L 26  26  25    Calcium 8.9 - 10.3 mg/dL 8.5  8.7  8.0     CXR: PV congestion  _______________________________________________________________  Assessment and Plan: POD 2 s/p CABG  Neuro: pain controlled CV: on A/S/BB. Pulm: will remove CT Renal: will remove foley GI: on diet Heme: stable ID: [email or web address] Endo: SSI Dispo: continue ICU care and PT/OT   Erica Estrada Erica Estrada 12/15/2022 9:26 AM

## 2022-12-15 NOTE — Plan of Care (Signed)
  Problem: Cardiovascular: Goal: Ability to achieve and maintain adequate cardiovascular perfusion will improve Outcome: Progressing Goal: Vascular access site(s) Level 0-1 will be maintained Outcome: Progressing   Problem: Skin Integrity: Goal: Wound healing without signs and symptoms of infection Outcome: Progressing Goal: Risk for impaired skin integrity will decrease Outcome: Progressing   Problem: Clinical Measurements: Goal: Postoperative complications will be avoided or minimized Outcome: Progressing

## 2022-12-16 ENCOUNTER — Inpatient Hospital Stay (HOSPITAL_COMMUNITY): Payer: Medicare HMO

## 2022-12-16 LAB — CBC
HCT: 27.1 % — ABNORMAL LOW (ref 36.0–46.0)
Hemoglobin: 8.3 g/dL — ABNORMAL LOW (ref 12.0–15.0)
MCH: 27.9 pg (ref 26.0–34.0)
MCHC: 30.6 g/dL (ref 30.0–36.0)
MCV: 90.9 fL (ref 80.0–100.0)
Platelets: 173 10*3/uL (ref 150–400)
RBC: 2.98 MIL/uL — ABNORMAL LOW (ref 3.87–5.11)
RDW: 13.2 % (ref 11.5–15.5)
WBC: 11.8 10*3/uL — ABNORMAL HIGH (ref 4.0–10.5)
nRBC: 0 % (ref 0.0–0.2)

## 2022-12-16 LAB — BASIC METABOLIC PANEL
Anion gap: 6 (ref 5–15)
BUN: 13 mg/dL (ref 8–23)
CO2: 24 mmol/L (ref 22–32)
Calcium: 8.1 mg/dL — ABNORMAL LOW (ref 8.9–10.3)
Chloride: 106 mmol/L (ref 98–111)
Creatinine, Ser: 0.73 mg/dL (ref 0.44–1.00)
GFR, Estimated: >60 mL/min (ref >60)
Glucose, Bld: 127 mg/dL — ABNORMAL HIGH (ref 70–99)
Potassium: 3.8 mmol/L (ref 3.5–5.1)
Sodium: 136 mmol/L (ref 135–145)

## 2022-12-16 LAB — GLUCOSE, CAPILLARY
Glucose-Capillary: 136 mg/dL — ABNORMAL HIGH (ref 70–99)
Glucose-Capillary: 134 mg/dL — ABNORMAL HIGH (ref 70–99)
Glucose-Capillary: 176 mg/dL — ABNORMAL HIGH (ref 70–99)
Glucose-Capillary: 177 mg/dL — ABNORMAL HIGH (ref 70–99)
Glucose-Capillary: 201 mg/dL — ABNORMAL HIGH (ref 70–99)

## 2022-12-16 MED ORDER — INSULIN ASPART 100 UNIT/ML IJ SOLN
0.0000 [IU] | Freq: Three times a day (TID) | INTRAMUSCULAR | Status: DC
  Administered 2022-12-16: 8 [IU] via SUBCUTANEOUS
  Administered 2022-12-17: 4 [IU] via SUBCUTANEOUS
  Administered 2022-12-17: 2 [IU] via SUBCUTANEOUS
  Administered 2022-12-17: 4 [IU] via SUBCUTANEOUS
  Administered 2022-12-17: 2 [IU] via SUBCUTANEOUS
  Administered 2022-12-18: 4 [IU] via SUBCUTANEOUS
  Administered 2022-12-19: 2 [IU] via SUBCUTANEOUS
  Administered 2022-12-19: 4 [IU] via SUBCUTANEOUS

## 2022-12-16 MED ORDER — SODIUM CHLORIDE 0.9% FLUSH
3.0000 mL | Freq: Two times a day (BID) | INTRAVENOUS | Status: DC
  Administered 2022-12-16 (×2): 3 mL via INTRAVENOUS

## 2022-12-16 MED ORDER — SODIUM CHLORIDE 0.9% FLUSH: 3.0000 mL | INTRAVENOUS | Status: DC | PRN

## 2022-12-16 MED ORDER — FUROSEMIDE 40 MG PO TABS
40.0000 mg | ORAL_TABLET | Freq: Every day | ORAL | Status: AC
  Administered 2022-12-16 – 2022-12-17 (×2): 40 mg via ORAL
  Filled 2022-12-16 (×2): qty 1

## 2022-12-16 MED ORDER — FE FUM-VIT C-VIT B12-FA 460-60-0.01-1 MG PO CAPS
1.0000 | ORAL_CAPSULE | Freq: Every day | ORAL | Status: DC
  Administered 2022-12-17 – 2022-12-20 (×4): 1 via ORAL
  Filled 2022-12-16 (×4): qty 1

## 2022-12-16 MED ORDER — POTASSIUM CHLORIDE CRYS ER 20 MEQ PO TBCR
40.0000 meq | EXTENDED_RELEASE_TABLET | Freq: Every day | ORAL | Status: AC
  Administered 2022-12-16 – 2022-12-17 (×2): 40 meq via ORAL
  Filled 2022-12-16 (×2): qty 2

## 2022-12-16 MED ORDER — SODIUM CHLORIDE 0.9 % IV SOLN: 250.0000 mL | INTRAVENOUS | Status: DC | PRN

## 2022-12-16 MED ORDER — ~~LOC~~ CARDIAC SURGERY, PATIENT & FAMILY EDUCATION: Freq: Once | Status: AC

## 2022-12-16 NOTE — Evaluation (Signed)
Physical Therapy Evaluation Patient Details Name: Erica Estrada MRN: [id number] DOB: 1950-05-28 Today's Date: 12/16/2022  History of Present Illness  Pt is a 72yo female who underwent CABGx3 on 10/3 due to CAD. PMH: DM II, HTN, HLD, CAD, arthritis, Graves disease, L eye blindness   Clinical Impression  Pt admitted with above. Pt very sleepy this date requiring constant verbal cues to stay awake. Pt functioning at [facility] for transfers and minA for ambulation. Spoke with daughter over phone to see available support as pt reports that her husband can not assist her. Explained to daughter that the patient wouldn't be able to drive, lift groceries, reach down into washer due to sternal precautions and dtr reports that "she is having a nurse stay with her" and this PT also talked to that patient on the phone who confirmed she would be staying with the patient upon d/c. Acute PT to cont to follow. Recommend home health PT and 24/7 for safe d/c home.        If plan is discharge home, recommend the following: A little help with walking and/or transfers;A little help with bathing/dressing/bathroom;Assistance with cooking/housework;Assist for transportation   Can travel by private vehicle        Equipment Recommendations None recommended by PT  Recommendations for Other Services       Functional Status Assessment Patient has had a recent decline in their functional status and demonstrates the ability to make significant improvements in function in a reasonable and predictable amount of time.     Precautions / Restrictions Precautions Precautions: Sternal Precaution Comments: verbally went over them, pt recalled "move in the tube" Restrictions Weight Bearing Restrictions: Yes RUE Weight Bearing: Non weight bearing LUE Weight Bearing: Non weight bearing Other Position/Activity Restrictions: sternal precautions      Mobility  Bed Mobility               General bed mobility comments:  pt received up in chair    Transfers Overall transfer level: Needs assistance Equipment used: None Transfers: Sit to/from Stand Sit to Stand: Mod assist           General transfer comment: modA to power up, hugged heart pillow, required 2 attempts to complete stand    Ambulation/Gait Ambulation/Gait assistance: Min assist Gait Distance (Feet): 370 Feet Assistive device: Hemi-walker Gait Pattern/deviations: Step-through pattern, Decreased stride length, Trunk flexed Gait velocity: dec Gait velocity interpretation: <1.31 ft/sec, indicative of household ambulator   General Gait Details: verbal cues to not lean on arms but to use EVA walker for balance and rely more on LEs. RR increased to 40s, noted mild SOB, SPO2 >96% on 2LO2 via [place], minA for EVA walker management  Stairs            Wheelchair Mobility     Tilt Bed    Modified Rankin (Stroke Patients Only)       Balance Overall balance assessment: Mild deficits observed, not formally tested                                           Pertinent Vitals/Pain Pain Assessment Pain Assessment: Faces Faces Pain Scale: Hurts a little bit Pain Location: chest with movement Pain Descriptors / Indicators: Grimacing Pain Intervention(s): Monitored during session    Home Living Family/patient expects to be discharged to:: Private residence Living Arrangements: Spouse/significant other Available Help at Discharge: Family;Friend(s);Available  24 hours/day Type of Home: Apartment Home Access: Level entry       Home Layout: One level Home Equipment: Rollator (4 wheels);Shower seat;Grab bars - tub/shower Additional Comments: spoke with daughter Erica Estrada who reports herself and a Charity fundraiser is going to be providing assist to patient as her spouse unable to assist    Prior Function Prior Level of Function : Independent/Modified Independent             Mobility Comments: used rollator, drove ADLs Comments:  indep, used shower seat     Extremity/Trunk Assessment   Upper Extremity Assessment Upper Extremity Assessment: Generalized weakness    Lower Extremity Assessment Lower Extremity Assessment: Generalized weakness    Cervical / Trunk Assessment Cervical / Trunk Assessment: Other exceptions Cervical / Trunk Exceptions: sternal incision  Communication   Communication Communication: Difficulty communicating thoughts/reduced clarity of speech (muffled)  Cognition Arousal: Lethargic Behavior During Therapy: Flat affect Overall Cognitive Status: Within Functional Limits for tasks assessed                                 General Comments:  (pt with difficulty staying awake but able to answer questions appropriately, pt more awake when walking but unable to stay awake when in chair)        General Comments General comments (skin integrity, edema, etc.): sternal incision intact    Exercises     Assessment/Plan    PT Assessment Patient needs continued PT services  PT Problem List Decreased strength;Decreased activity tolerance;Decreased balance;Decreased mobility;Decreased cognition;Decreased knowledge of use of DME;Decreased safety awareness;Cardiopulmonary status limiting activity       PT Treatment Interventions DME instruction;Gait training;Functional mobility training;Therapeutic activities;Therapeutic exercise    PT Goals (Current goals can be found in the Care Plan section)  Acute Rehab PT Goals Patient Stated Goal: get better PT Goal Formulation: With patient Time For Goal Achievement: 12/30/22 Potential to Achieve Goals: Good    Frequency Min 1X/week     Co-evaluation               AM-PAC PT "6 Clicks" Mobility  Outcome Measure Help needed turning from your back to your side while in a flat bed without using bedrails?: A Little Help needed moving from lying on your back to sitting on the side of a flat bed without using bedrails?: A  Lot Help needed moving to and from a bed to a chair (including a wheelchair)?: A Little Help needed standing up from a chair using your arms (e.g., wheelchair or bedside chair)?: A Little Help needed to walk in hospital room?: A Little Help needed climbing 3-5 steps with a railing? : A Lot 6 Click Score: 16    End of Session Equipment Utilized During Treatment: Oxygen Activity Tolerance: Patient tolerated treatment well Patient left: in chair;with call bell/phone within reach;with family/visitor present Nurse Communication: Mobility status PT Visit Diagnosis: Unsteadiness on feet (R26.81);Difficulty in walking, not elsewhere classified (R26.2)    Time: 0940-1009 PT Time Calculation (min) (ACUTE ONLY): 29 min   Charges:   PT Evaluation $PT Eval Moderate Complexity: 1 Mod PT Treatments $Gait Training: 8-22 mins PT General Charges $$ ACUTE PT VISIT: 1 Visit         Lewis Shock, PT, DPT Acute Rehabilitation Services Secure chat preferred Office #: [phone]   Iona Hansen 12/16/2022, 10:31 AM

## 2022-12-16 NOTE — Progress Notes (Signed)
EVENING ROUNDS NOTE :     [address]       [place] [address]             [phone]                 3 Days Post-Op Procedure(s) (LRB): CORONARY ARTERY BYPASS GRAFTING (CABG) TIMES THREE UTILIZING LEFT INTERNAL MAMMARY ARTERY AND ENDOSCOPIC VEIN HARVEST RIGHT GREATER SAPHENOUS VEIN (N/A) TRANSESOPHAGEAL ECHOCARDIOGRAM (N/A)   Total Length of Stay:  LOS: 3 days  Events:   No events Awaiting floor bed    BP 118/80   Pulse 69   Temp 97.8 F (36.6 C) (Oral)   Resp 15   Ht 5\' 5"  (1.651 m)   Wt 76.8 kg   SpO2 97%   BMI 28.18 kg/m          sodium chloride     sodium chloride     lactated ringers     lactated ringers Stopped (12/14/22 1428)   promethazine (PHENERGAN) injection (IM or IVPB)      I/O last 3 completed shifts: In: 800 [P.O.:800] Out: 545 [Urine:545]      Latest Ref Rng & Units 12/16/2022    4:13 AM 12/15/2022    3:58 AM 12/14/2022    4:10 PM  CBC  WBC 4.0 - 10.5 K/uL 11.8  12.8  16.7   Hemoglobin 12.0 - 15.0 g/dL 8.3  8.6  9.9   Hematocrit 36.0 - 46.0 % 27.1  28.2  31.9   Platelets 150 - 400 K/uL 173  168  228        Latest Ref Rng & Units 12/16/2022    4:13 AM 12/15/2022    3:58 AM 12/14/2022    4:10 PM  BMP  Glucose 70 - 99 mg/dL 914  782  956   BUN 8 - 23 mg/dL 13  16  12    Creatinine 0.44 - 1.00 mg/dL 2.13  0.86  5.78   Sodium 135 - 145 mmol/L 136  139  139   Potassium 3.5 - 5.1 mmol/L 3.8  4.5  4.3   Chloride 98 - 111 mmol/L 106  105  104   CO2 22 - 32 mmol/L 24  26  26    Calcium 8.9 - 10.3 mg/dL 8.1  8.5  8.7     ABG    Component Value Date/Time   PHART 7.340 (L) 12/13/2022 1920   PCO2ART 45.4 12/13/2022 1920   PO2ART 89 12/13/2022 1920   HCO3 24.4 12/13/2022 1920   TCO2 26 12/13/2022 1920   ACIDBASEDEF 1.0 12/13/2022 1920   O2SAT 96 12/13/2022 1920       Brynda Greathouse, MD 12/16/2022 8:20 PM

## 2022-12-16 NOTE — Progress Notes (Signed)
      [address]       [place] [address]             [phone]                 3 Days Post-Op Procedure(s) (LRB): CORONARY ARTERY BYPASS GRAFTING (CABG) TIMES THREE UTILIZING LEFT INTERNAL MAMMARY ARTERY AND ENDOSCOPIC VEIN HARVEST RIGHT GREATER SAPHENOUS VEIN (N/A) TRANSESOPHAGEAL ECHOCARDIOGRAM (N/A)   Events: No events _______________________________________________________________ Vitals: BP 112/71 (BP Location: Left Arm)   Pulse 73   Temp 98.5 F (36.9 C)   Resp 11   Ht 5\' 5"  (1.651 m)   Wt 76.8 kg   SpO2 100%   BMI 28.18 kg/m  Filed Weights   12/14/22 0500 12/15/22 0500 12/16/22 0500  Weight: 77.6 kg 77 kg 76.8 kg     - Neuro: alert NAD  - Cardiovascular: sinus  Drips: none.      - Pulm: EWOB    ABG    Component Value Date/Time   PHART 7.340 (L) 12/13/2022 1920   PCO2ART 45.4 12/13/2022 1920   PO2ART 89 12/13/2022 1920   HCO3 24.4 12/13/2022 1920   TCO2 26 12/13/2022 1920   ACIDBASEDEF 1.0 12/13/2022 1920   O2SAT 96 12/13/2022 1920    - Abd: ND - Extremity: trace edema  .Intake/Output      10/05 0701 10/06 0700 10/06 0701 10/07 0700   P.O. 400    I.V. (mL/kg)     IV Piggyback     Total Intake(mL/kg) 400 (5.2)    Urine (mL/kg/hr) 545 (0.3)    Chest Tube     Total Output 545    Net -145         Urine Occurrence 1 x       _______________________________________________________________ Labs:    Latest Ref Rng & Units 12/16/2022    4:13 AM 12/15/2022    3:58 AM 12/14/2022    4:10 PM  CBC  WBC 4.0 - 10.5 K/uL 11.8  12.8  16.7   Hemoglobin 12.0 - 15.0 g/dL 8.3  8.6  9.9   Hematocrit 36.0 - 46.0 % 27.1  28.2  31.9   Platelets 150 - 400 K/uL 173  168  228       Latest Ref Rng & Units 12/16/2022    4:13 AM 12/15/2022    3:58 AM 12/14/2022    4:10 PM  CMP  Glucose 70 - 99 mg/dL 914  782  956   BUN 8 - 23 mg/dL 13  16  12    Creatinine 0.44 - 1.00 mg/dL 2.13  0.86  5.78   Sodium 135 - 145 mmol/L 136  139  139    Potassium 3.5 - 5.1 mmol/L 3.8  4.5  4.3   Chloride 98 - 111 mmol/L 106  105  104   CO2 22 - 32 mmol/L 24  26  26    Calcium 8.9 - 10.3 mg/dL 8.1  8.5  8.7     CXR: -  _______________________________________________________________  Assessment and Plan: POD 3 s/p CABG  Neuro: pain controlled CV: on A/S/BB. Pulm: will remove CT Renal: creat stable GI: on diet Heme: stable ID: [email or web address] Endo: SSI Dispo:floor   Geremy Rister O Jeffory Snelgrove 12/16/2022 10:10 AM

## 2022-12-16 NOTE — Evaluation (Signed)
Occupational Therapy Evaluation Patient Details Name: Erica Estrada MRN: [id number] DOB: 17-Jun-1950 Today's Date: 12/16/2022   History of Present Illness Pt is a 72yo female who underwent CABGx3 on 10/3 due to CAD. PMH: DM II, HTN, HLD, CAD, arthritis, Graves disease, L eye blindness   Clinical Impression   Pt reports ind at baseline without AD, lives with spouse but is planning to have assist from family at d/c. Pt currently needing set up -mod A for ADLs, mod A for bed mobility and min A for transfers with [name] walker. Pt needing mod cues to remain alert during session, frequently dozing off. Pt provided with handout and educated on sternal precautions, pt verbalized understanding but will benefit from reinforcement. Pt able to complete seated UB/LB bathing and grooming tasks. Pt presenting with impairments listed below, will follow acutely. Recommend HHOT at d/c.        If plan is discharge home, recommend the following: A little help with walking and/or transfers;A lot of help with bathing/dressing/bathroom;Assistance with cooking/housework;Direct supervision/assist for financial management;Direct supervision/assist for medications management;Assist for transportation;Help with stairs or ramp for entrance    Functional Status Assessment  Patient has had a recent decline in their functional status and demonstrates the ability to make significant improvements in function in a reasonable and predictable amount of time.  Equipment Recommendations  BSC/3in1    Recommendations for Other Services PT consult     Precautions / Restrictions Precautions Precautions: Sternal Precaution Booklet Issued: Yes (comment) Precaution Comments: educated pt on sternal prec via handout Restrictions Other Position/Activity Restrictions: sternal precautions      Mobility Bed Mobility Overal bed mobility: Needs Assistance Bed Mobility: Sidelying to Sit   Sidelying to sit: Mod assist        General bed mobility comments: mod A for trunk elevation    Transfers Overall transfer level: Needs assistance Equipment used: None Transfers: Sit to/from Stand Sit to Stand: Min assist           General transfer comment: with eva walker      Balance Overall balance assessment: Needs assistance Sitting-balance support: Feet supported Sitting balance-Leahy Scale: Good     Standing balance support: During functional activity, Reliant on assistive device for balance Standing balance-Leahy Scale: Poor Standing balance comment: reliant on exernal support                           ADL either performed or assessed with clinical judgement   ADL Overall ADL's : Needs assistance/impaired Eating/Feeding: Set up   Grooming: Set up;Wash/dry face Grooming Details (indicate cue type and reason): washes face seated in chair Upper Body Bathing: Moderate assistance Upper Body Bathing Details (indicate cue type and reason): assist to wash back Lower Body Bathing: Moderate assistance Lower Body Bathing Details (indicate cue type and reason): washing legs seated in chair Upper Body Dressing : Minimal assistance   Lower Body Dressing: Moderate assistance   Toilet Transfer: Minimal assistance Toilet Transfer Details (indicate cue type and reason): with eva walker Toileting- Clothing Manipulation and Hygiene: Minimal assistance       Functional mobility during ADLs: Minimal assistance       Vision   Additional Comments: L eye blindness     Perception Perception: Not tested       Praxis Praxis: Not tested       Pertinent Vitals/Pain Pain Assessment Pain Assessment: Faces Pain Score: 2  Faces Pain Scale: Hurts a little bit Pain  Location: chest with movement Pain Descriptors / Indicators: Grimacing Pain Intervention(s): Limited activity within patient's tolerance, Monitored during session, Repositioned     Extremity/Trunk Assessment Upper Extremity  Assessment Upper Extremity Assessment: Generalized weakness   Lower Extremity Assessment Lower Extremity Assessment: Defer to PT evaluation   Cervical / Trunk Assessment Cervical / Trunk Assessment: Other exceptions Cervical / Trunk Exceptions: sternal incision   Communication Communication Communication: Difficulty communicating thoughts/reduced clarity of speech   Cognition Arousal: Lethargic Behavior During Therapy: Flat affect Overall Cognitive Status: Impaired/Different from baseline Area of Impairment: Attention, Following commands, Memory                   Current Attention Level: Focused Memory: Decreased recall of precautions, Decreased short-term memory Following Commands: Follows one step commands with increased time       General Comments: frequently closing eyes, needs cues to remain alert during session, aware she is at the hospital and with cues is able to recall procedure she had     General Comments  sternal incision CDI    Exercises     Shoulder Instructions      Home Living Family/patient expects to be discharged to:: Private residence Living Arrangements: Spouse/significant other Available Help at Discharge: Family;Friend(s);Available 24 hours/day Type of Home: Apartment Home Access: Level entry     Home Layout: One level     Bathroom Shower/Tub: Producer, television/film/video: Standard Bathroom Accessibility: Yes   Home Equipment: Rollator (4 wheels);Shower seat;Grab bars - tub/shower   Additional Comments: spoke with daughter [name] who reports herself and a RN is going to be providing assist to patient as her spouse unable to assist      Prior Functioning/Environment Prior Level of Function : Independent/Modified Independent             Mobility Comments: no AD per pt, pt states "just my shoes" ADLs Comments: ind        OT Problem List: Decreased strength;Decreased range of motion;Decreased activity  tolerance;Impaired balance (sitting and/or standing);Decreased cognition;Decreased safety awareness;Impaired vision/perception;Cardiopulmonary status limiting activity      OT Treatment/Interventions: Self-care/ADL training;Therapeutic exercise;Energy conservation;DME and/or AE instruction;Therapeutic activities;Patient/family education;Balance training;Visual/perceptual remediation/compensation;Cognitive remediation/compensation    OT Goals(Current goals can be found in the care plan section) Acute Rehab OT Goals Patient Stated Goal: none stated OT Goal Formulation: With patient Time For Goal Achievement: 12/30/22 Potential to Achieve Goals: Good ADL Goals Pt Will Perform Upper Body Dressing: with supervision;sitting Pt Will Perform Lower Body Dressing: with supervision;sit to/from stand;sitting/lateral leans Pt Will Transfer to Toilet: with supervision;ambulating;regular height toilet Pt Will Perform Tub/Shower Transfer: Shower transfer;Tub transfer;with supervision;ambulating;shower seat Additional ADL Goal #1: pt will perform x3 functional tasks with min cues for sternal precautions in prep for ADLs  OT Frequency: Min 1X/week    Co-evaluation              AM-PAC OT "6 Clicks" Daily Activity     Outcome Measure Help from another person eating meals?: A Little Help from another person taking care of personal grooming?: A Little Help from another person toileting, which includes using toliet, bedpan, or urinal?: A Little Help from another person bathing (including washing, rinsing, drying)?: A Lot Help from another person to put on and taking off regular upper body clothing?: A Little Help from another person to put on and taking off regular lower body clothing?: A Lot 6 Click Score: 16   End of Session Equipment Utilized During Treatment: Oxygen (eva  walker) Nurse Communication: Mobility status  Activity Tolerance: Patient tolerated treatment well Patient left: in chair;with  call bell/phone within reach;with family/visitor present  OT Visit Diagnosis: Unsteadiness on feet (R26.81);Other abnormalities of gait and mobility (R26.89);Muscle weakness (generalized) (M62.81)                Time: 1610-9604 OT Time Calculation (min): 31 min Charges:  OT General Charges $OT Visit: 1 Visit OT Evaluation $OT Eval Moderate Complexity: 1 Mod OT Treatments $Self Care/Home Management : 8-22 mins  Carver Fila, OTD, OTR/L SecureChat Preferred Acute Rehab [phone]   Carver Fila Koonce 12/16/2022, 2:16 PM

## 2022-12-17 LAB — CBC
HCT: 26.6 % — ABNORMAL LOW (ref 36.0–46.0)
Hemoglobin: 8.2 g/dL — ABNORMAL LOW (ref 12.0–15.0)
MCH: 28.2 pg (ref 26.0–34.0)
MCHC: 30.8 g/dL (ref 30.0–36.0)
MCV: 91.4 fL (ref 80.0–100.0)
Platelets: 230 10*3/uL (ref 150–400)
RBC: 2.91 MIL/uL — ABNORMAL LOW (ref 3.87–5.11)
RDW: 13 % (ref 11.5–15.5)
WBC: 9.6 10*3/uL (ref 4.0–10.5)
nRBC: 0 % (ref 0.0–0.2)

## 2022-12-17 LAB — BASIC METABOLIC PANEL
Anion gap: 10 (ref 5–15)
BUN: 16 mg/dL (ref 8–23)
CO2: 21 mmol/L — ABNORMAL LOW (ref 22–32)
Calcium: 8.4 mg/dL — ABNORMAL LOW (ref 8.9–10.3)
Chloride: 103 mmol/L (ref 98–111)
Creatinine, Ser: 0.79 mg/dL (ref 0.44–1.00)
GFR, Estimated: >60 mL/min (ref >60)
Glucose, Bld: 200 mg/dL — ABNORMAL HIGH (ref 70–99)
Potassium: 4.2 mmol/L (ref 3.5–5.1)
Sodium: 134 mmol/L — ABNORMAL LOW (ref 135–145)

## 2022-12-17 LAB — GLUCOSE, CAPILLARY
Glucose-Capillary: 170 mg/dL — ABNORMAL HIGH (ref 70–99)
Glucose-Capillary: 144 mg/dL — ABNORMAL HIGH (ref 70–99)
Glucose-Capillary: 188 mg/dL — ABNORMAL HIGH (ref 70–99)
Glucose-Capillary: 126 mg/dL — ABNORMAL HIGH (ref 70–99)

## 2022-12-17 MED ORDER — METFORMIN HCL 500 MG PO TABS
1000.0000 mg | ORAL_TABLET | Freq: Two times a day (BID) | ORAL | Status: DC
  Administered 2022-12-17 – 2022-12-20 (×7): 1000 mg via ORAL
  Filled 2022-12-17 (×7): qty 2

## 2022-12-17 MED ORDER — INFLUENZA VAC A&B SURF ANT ADJ 0.5 ML IM SUSY
0.5000 mL | PREFILLED_SYRINGE | INTRAMUSCULAR | Status: AC
  Administered 2022-12-19: 0.5 mL via INTRAMUSCULAR
  Filled 2022-12-17: qty 0.5

## 2022-12-17 MED ORDER — METFORMIN HCL 850 MG PO TABS
850.0000 mg | ORAL_TABLET | Freq: Two times a day (BID) | ORAL | Status: DC
  Filled 2022-12-17: qty 1

## 2022-12-17 MED ORDER — LACTULOSE 10 GM/15ML PO SOLN
20.0000 g | Freq: Once | ORAL | Status: AC
  Administered 2022-12-17: 20 g via ORAL
  Filled 2022-12-17: qty 30

## 2022-12-17 MED ORDER — INSULIN DETEMIR 100 UNIT/ML ~~LOC~~ SOLN
25.0000 [IU] | Freq: Every day | SUBCUTANEOUS | Status: DC
  Administered 2022-12-17 – 2022-12-18 (×2): 25 [IU] via SUBCUTANEOUS
  Filled 2022-12-17 (×3): qty 0.25

## 2022-12-17 NOTE — Progress Notes (Signed)
CARDIAC REHAB PHASE I   PRE:  Rate/Rhythm: 76 SR    BP: sitting 112/62    SpO2: 94 RA  MODE:  Ambulation: 50 ft   POST:  Rate/Rhythm: 92 SR    BP: sitting 119/77     SpO2: 98 RA  Pt required verbal cues for sternal precautions to stand. Rocked and mod assist to stand. Used RW, contact guard. Small steps, slow. Major c/o was back pain, which is her baseline. To BR without BM then to recliner. VSS. Will continue to need reminders for sternal precautions. Practiced IS, 750 ml. Sister present.  [phone]   Ethelda Chick BS, ACSM-CEP 12/17/2022 2:35 PM

## 2022-12-17 NOTE — Discharge Summary (Addendum)
[address]       [place] [address]             [phone]    Physician Discharge Summary  Patient ID: Erica Estrada MRN: [id number] DOB/AGE: 1950/04/23 72 y.o.  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   Posttraumatic stress disorder 12/23/2018   Severe recurrent major depression without psychotic features (HCC) 11/10/2017   Abdominal pain, chronic, generalized 09/20/2016   Hepatic steatosis 08/14/2016   Multiple thyroid nodules 01/12/2016   Herniation of lumbar intervertebral disc with radiculopathy 05/20/2013   Degenerative disc disease 07/18/2011   Allergic rhinitis 06/21/2011   GERD (gastroesophageal reflux disease) 04/28/2011   Glaucoma suspect with open angle 04/17/2011   Depression 12/26/2010   Vision impairment 12/26/2010   Hypertension 12/26/2010   Hyperlipidemia with target LDL less than 70 12/26/2010   H/O: substance abuse (HCC) 12/26/2010   Diabetes mellitus, type 2 (HCC) 12/26/2010   Cervical stenosis of spine 12/26/2010     Discharge Diagnoses:  Patient Active Problem List   Diagnosis Date Noted   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   Abnormal stress test 11/27/2022   Shortness of breath 11/27/2022   Anemia 01/22/2021   Chest pain of uncertain etiology 01/21/2021   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   Posttraumatic stress disorder 12/23/2018   Severe recurrent major depression without psychotic features (HCC) 11/10/2017   Abdominal pain, chronic, generalized 09/20/2016   Hepatic steatosis 08/14/2016   Multiple thyroid nodules 01/12/2016   Herniation of lumbar intervertebral disc with radiculopathy 05/20/2013   Degenerative disc disease  07/18/2011   Allergic rhinitis 06/21/2011   GERD (gastroesophageal reflux disease) 04/28/2011   Glaucoma suspect with open angle 04/17/2011   Depression 12/26/2010   Vision impairment 12/26/2010   Hypertension 12/26/2010   Hyperlipidemia with target LDL less than 70 12/26/2010   H/O: substance abuse (HCC) 12/26/2010   Diabetes mellitus, type 2 (HCC) 12/26/2010   Cervical stenosis of spine 12/26/2010     Discharged Condition: Stable  History of Present Illness:  Erica Estrada is a 72 year-old woman with history of type 2 diabetes, hypertension, hyperlipidemia, previous smoking, and coronary artery disease who was referred for consideration of coronary bypass surgery.  She had a Lexiscan Myoview study in 02/2021 which showed no significant ischemia.  A subsequent study on 10/09/2022 showed evidence of infarction and ischemia.  Left ventricular function was normal.  It was felt to be a very suboptimal study due to intense GI uptake at the border of the inferior wall.  There was felt that the anterior wall reversible defect may be due to artifact.  She subsequently underwent cardiac catheterization on 11/27/2022 showing severe three-vessel coronary disease with mildly elevated right heart and pulm artery pressures.  There was 70% mid LAD stenosis that was severely calcified.  There were 70% and 90% mid left circumflex stenoses.  The right coronary artery had moderate 40 to 50% mid vessel stenoses.  There is 80% distal stenosis before the posterior descending branch.  Echocardiogram on 11/27/2022 showed an ejection fraction of 55 to 60% with grade 1 diastolic dysfunction.  There is mild mitral regurgitation.  She was referred to Dr.  Bartle for surgical evaluation at which time she reports having episodes of chest tightness and shortness of breath and fatigue have been occurring with exertion as well as occasionally at rest while sleeping.  She has been on Imdur.  It was recommended she undergo coronary  bypass grafting for relief of her symptoms.  The risks and benefits of the procedure were explained to the patient and she was agreeable to proceed.  Hospital Course:  [name] presents to [facility] on 12[facility]13/2022.  She was taken to the operating room and underwent Coronary Bypass Grafting x 3 utilizing LIMA to LAD, SVG to PDA, and SVG to Diagonal.  She also underwent endoscopic harvest greater saphenous vein from her right leg.  She tolerated the procedure without difficulty and was taken to the SICU in stable condition.  She was extubated the evening of surgery.  She did not require inotropic/pressor support post operatively.  She underwent removal of mediastinal chest tubes, arterial lines, and swan on POD #1.  She was transitioned off insulin drip to Levemir and sliding scale coverage.  She was on Jardiance, Insulin, and Metformin prior to surgery. Metformin was restarted on post op day 4. Jardiance will be restarted at discharge. Her pre op HGA1C was 8.2. She will require close medical follow up after discharge. She underwent removal of chest tubes, arterial lines, and pacing wires without difficulty.  She remained in NSR and was stable for transfer to the progressive care unit on 12/16/2022.  PT and OT evaluations were obtained and they recommended home health PT and OT. She needs a lot of encouragement to ambulate. She had mild volume overload and was diuresed accordingly. Epicardial pacing wires were removed on 10/08. She has been tolerating a diet and with the administration of laxatives, had a bowel movement. She has been ambulating on room air with good oxygenation. All wounds are clean, dry, healing without signs of infection. Her ambulation improved and she is felt stable for discharge today. Of note, I asked if she is taking both Zoloft and Paxil (history of MDD) and she states she just takes Paxil so only this has been restarted post op.    Consults: None  Significant  Diagnostic Studies:   Narrative & Impression  CLINICAL DATA:  [id number] S/P CABG x 3 [id number].   EXAM: PORTABLE CHEST 1 VIEW   COMPARISON:  12/13/2022.   FINDINGS: Low lung volume. Persistent central vascular congestion. No frank pulmonary edema. There are atelectatic changes at the left lung base with small left pleural effusion, unchanged. Right lateral costophrenic angle is clear. No pneumothorax on either side.   Stable cardio-mediastinal silhouette.   No acute osseous abnormalities.   The soft tissues are within normal limits.   Tube/lines: Status post recent median sternotomy/CABG. Interval removal of previously seen endotracheal and enteric tubes. Mediastinal drainage tubes in bilateral pleural drainage catheters are unchanged in position. Stable positioning of the right IJ Swan-Ganz catheter with its tip overlying the right main pulmonary artery.   IMPRESSION: 1. Interval removal of endotracheal and enteric tubes. Otherwise stable support apparatus. 2. Low lung volume with persistent central vascular congestion and small left pleural effusion.     Electronically Signed   By: Jules Schick M.D.   On: 12/14/2022 08:23    Treatments: surgery:  Median Sternotomy Extracorporeal circulation 3.   Coronary artery bypass grafting x 3   Left internal mammary artery graft to the LAD SVG to diagonal SVG to PDA   4.  Endoscopic vein harvest from the right leg by Dr. Laneta Simmers on 12/13/2022  Discharge Exam: Blood pressure 111/60, pulse 79, temperature 98.9 F (37.2 C), temperature source Oral, resp. rate 15, height 5\' 5"  (1.651 m), weight 74.4 kg, SpO2 95%. Cardiovascular: RRR Pulmonary: Clear to auscultation bilaterally; Abdomen: Soft, non tender, bowel sounds present. Extremities: Trace bilateral lower extremity edema. Wounds: Clean and dry.  No erythema or signs of infection.   Discharge Medications:  The patient has been discharged on:   1.Beta Blocker:  Yes  [  x ]                              No   [   ]                              If No, reason:  2.Ace Inhibitor/ARB: Yes [   ]                                     No  [   x ]                                     If No, reason:Labile BP;hope to start after discharge  3.Statin:   Yes [  x ]                  No  [   ]                  If No, reason:  4.Ecasa:  Yes  [ x  ]                  No   [   ]                  If No, reason:  Patient had ACS upon admission:No  Plavix/P2Y12 inhibitor: Yes [   ]                                      No  [  x ]     Discharge Instructions     Amb Referral to Cardiac Rehabilitation   Complete by: As directed    Diagnosis: CABG   CABG X ___: 3   After initial evaluation and assessments completed: Virtual Based Care may be provided alone or in conjunction with Phase 2 Cardiac Rehab based on patient barriers.: Yes   Intensive Cardiac Rehabilitation (ICR) [facility] location only OR Traditional Cardiac Rehabilitation (TCR) *If criteria for ICR are not met will enroll in TCR [facility] only): Yes      Allergies as of 12/20/2022       Reactions   Ace Inhibitors Cough   Chronic cough for 3 months. Trial off lisinopril starting 05/11/2011 effective in stopping cough.   Fluticasone Other (See Comments)   choking        Medication List     STOP taking these medications    acetaminophen 650 MG CR tablet Commonly known as: TYLENOL   amLODipine 10 MG tablet Commonly known as: NORVASC   diclofenac Sodium 1 % Gel Commonly  known as: VOLTAREN   ibuprofen 800 MG tablet Commonly known as: ADVIL   isosorbide mononitrate 30 MG 24 hr tablet Commonly known as: IMDUR   nitroGLYCERIN 0.4 MG SL tablet Commonly known as: NITROSTAT   pregabalin 100 MG capsule Commonly known as: LYRICA   sertraline 25 MG tablet Commonly known as: ZOLOFT   traMADol 50 MG tablet Commonly known as: ULTRAM       TAKE these medications    Aspercreme Lidocaine 4 %  Crea Generic drug: Lidocaine HCl Apply 1 application  topically 2 (two) times daily as needed (pain). Apply to back and knees prn for pain   aspirin EC 325 MG tablet Take 1 tablet (325 mg total) by mouth daily. What changed:  medication strength how much to take   atorvastatin 80 MG tablet Commonly known as: LIPITOR Take 1 tablet (80 mg total) by mouth daily.   calcium carbonate 500 MG chewable tablet Commonly known as: TUMS - dosed in mg elemental calcium Chew 2 tablets by mouth 2 (two) times daily as needed for indigestion or heartburn.   camphor-menthol lotion Commonly known as: SARNA Apply 1 application topically as needed for itching.   Cholecalciferol 25 MCG (1000 UT) capsule Take 1,000 Units by mouth daily.   clobetasol cream 0.05 % Commonly known as: TEMOVATE Apply 1 application topically 2 (two) times a week. Apply to vulva   clotrimazole 1 % cream Commonly known as: LOTRIMIN Apply 1 application topically 2 (two) times daily.   cyclobenzaprine 10 MG tablet Commonly known as: FLEXERIL Take 10 mg by mouth 3 (three) times daily.   ferrous sulfate 325 (65 FE) MG tablet Take 1 tablet (325 mg total) by mouth daily.   folic acid 1 MG tablet Commonly known as: FOLVITE Take 1 mg by mouth daily.   furosemide 40 MG tablet Commonly known as: LASIX Take 1 tablet (40 mg total) by mouth daily. For 4 days then stop.   gabapentin 300 MG capsule Commonly known as: NEURONTIN Take 300 mg by mouth daily.   HYDROcodone-acetaminophen 5-325 MG tablet Commonly known as: NORCO/VICODIN Take 1 tablet by mouth every 6 (six) hours as needed. What changed: when to take this   Jardiance 25 MG Tabs tablet Generic drug: empagliflozin Take 25 mg by mouth daily.   metFORMIN 500 MG tablet Commonly known as: GLUCOPHAGE Take 1,000 mg by mouth 2 (two) times daily with a meal.   methimazole 5 MG tablet Commonly known as: TAPAZOLE Take 2.5 mg by mouth daily.   metoprolol tartrate  25 MG tablet Commonly known as: LOPRESSOR Take 1 tablet (25 mg total) by mouth 2 (two) times daily.   mometasone 50 MCG/ACT nasal spray Commonly known as: NASONEX Place 2 sprays into the nose daily as needed.   OneTouch Delica Plus Lancet33G Misc Apply topically.   OneTouch Ultra test strip Generic drug: glucose blood USE AS DIRECTED FOR BLOOD SUGAR TESTING THREE TIMES DAILY FOR DIABETES E11.9   PARoxetine 40 MG tablet Commonly known as: PAXIL Take 1 tablet (40 mg total) by mouth every morning.   potassium chloride SA 20 MEQ tablet Commonly known as: KLOR-CON M Take 1 tablet (20 mEq total) by mouth daily. For 4 days then stop.   Replens Gel Place 1 application vaginally 3 (three) times daily as needed. Apply to vagina and vulva as needed for itching. Use on days that you do not use clobetasol   Spiriva HandiHaler 18 MCG inhalation capsule Generic drug: tiotropium Place 1 capsule  into inhaler and inhale daily.   traZODone 50 MG tablet Commonly known as: DESYREL Take 50 mg by mouth at bedtime.   Evaristo Bury FlexTouch 200 UNIT/ML FlexTouch Pen Generic drug: insulin degludec Inject 65 Units into the skin at bedtime.   triamcinolone ointment 0.1 % Commonly known as: KENALOG SMARTSIG:1 Application Topical 2-3 Times Daily   Ventolin HFA 108 (90 Base) MCG/ACT inhaler Generic drug: albuterol Inhale 2 puffs into the lungs every 6 (six) hours as needed.               Durable Medical Equipment  (From admission, onward)           Start     Ordered   12/20/22 1127  For home use only DME Bedside commode  Once       Question:  Patient needs a bedside commode to treat with the following condition  Answer:  Physical deconditioning   12/20/22 1126   12/18/22 0700  For home use only DME 3 n 1  Once        12/18/22 0659            Follow-up Information     [facility] Follow up on 01/09/2023.   Why: Please get CXR at 1:30 prior to your appointment with Dr.  Sharee Pimple office Contact information: [address] [place] [address]        Charlsie Quest, NP Follow up on 01/03/2023.   Specialty: Cardiology Why: Appointment is at 10:00 Contact information: [address], [address] [place] [address] [phone]         [name], MD. Call.   Specialty: Family Medicine Why: for a follow up regarding further diabetes mangament and surveillance of HGA1C 8.2 Contact information: [address] [place] [address] [phone]         [facility] Follow up.   Why: ([facility])- bedside commode arranged- they will contact you regarding cost and delivery- Contact information: [address] [place] [address] [phone]         [facility] Follow up.   Specialty: Home Health Services Why: for home health services. They will call you in 1-2 days to set up a time to come out to your house Contact information: [address] [place] [address] [phone]         [facility]. Go on 01/09/2023.   Specialty: Cardiothoracic Surgery Why: Appointment is on 10/30 at 2;30 pm Contact information: [address] [employer], [address] [place] [address] [phone]                Signed:  Ardelle Balls, Cordelia Poche 12/20/2022, 12:14 PM

## 2022-12-17 NOTE — TOC Progression Note (Signed)
Transition of Care [facility]) - Progression Note    Patient Details  Name: Erica Estrada MRN: [id number] Date of Birth: [birth year]  Transition [facility] Care [facility]) CM/SW Contact  Ronny Bacon, RN Phone Number: 12/17/2022, 12:38 PM  Clinical Narrative:  patient given Medicare.gov home health agency list and will reach out once she makes a decision.      Expected Discharge Plan: Home w Home Health Services Barriers to Discharge: Continued Medical Work up  Expected Discharge Plan and Services   Discharge Planning Services: CM Consult Post Acute Care Choice: IP Rehab Living arrangements for the past 2 months: Single Family Home                                       Social Determinants of Health (SDOH) Interventions SDOH Screenings   Food Insecurity: No Food Insecurity (12/17/2022)  Housing: Low Risk  (12/17/2022)  Transportation Needs: Unmet Transportation Needs (12/17/2022)  Utilities: Not At Risk (12/17/2022)  Alcohol Screen: Medium Risk (11/10/2017)  Tobacco Use: Medium Risk (12/13/2022)    Readmission Risk Interventions     No data to display

## 2022-12-17 NOTE — Progress Notes (Addendum)
TCTS DAILY ICU PROGRESS NOTE                   [address]            [place] [address]          [phone]   4 Days Post-Op Procedure(s) (LRB): CORONARY ARTERY BYPASS GRAFTING (CABG) TIMES THREE UTILIZING LEFT INTERNAL MAMMARY ARTERY AND ENDOSCOPIC VEIN HARVEST RIGHT GREATER SAPHENOUS VEIN (N/A) TRANSESOPHAGEAL ECHOCARDIOGRAM (N/A)  Total Length of Stay:  LOS: 4 days   Subjective: Patient eating breakfast this am. She has no specific complaint. She did walk this weekend. She has not had a bowel movement yet  Objective: Vital signs in last 24 hours: Temp:  [97.5 F (36.4 C)-98.9 F (37.2 C)] 97.5 F (36.4 C) (10/07 0601) Pulse Rate:  [55-87] 72 (10/07 0630) Cardiac Rhythm: Normal sinus rhythm (10/06 2100) Resp:  [11-24] 13 (10/07 0630) BP: (95-143)/(54-116) 110/72 (10/07 0630) SpO2:  [92 %-100 %] 98 % (10/07 0630) Weight:  [76.7 kg] 76.7 kg (10/07 0500)  Filed Weights   12/15/22 0500 12/16/22 0500 12/17/22 0500  Weight: 77 kg 76.8 kg 76.7 kg    Weight change: -0.1 kg      Intake/Output from previous day: 10/06 0701 - 10/07 0700 In: 400 [P.O.:400] Out: 220 [Urine:220]  Intake/Output this shift: Total I/O In: -  Out: 220 [Urine:220]  Current Meds: Scheduled Meds:  acetaminophen  1,000 mg Oral Q6H   Or   acetaminophen (TYLENOL) oral liquid 160 mg/5 mL  1,000 mg Per Tube Q6H   aspirin EC  325 mg Oral Daily   Or   aspirin  324 mg Per Tube Daily   atorvastatin  80 mg Oral Daily   bisacodyl  10 mg Oral Daily   Or   bisacodyl  10 mg Rectal Daily   Chlorhexidine Gluconate Cloth  6 each Topical Daily   cyclobenzaprine  10 mg Oral TID   docusate sodium  200 mg Oral Daily   enoxaparin (LOVENOX) injection  40 mg Subcutaneous QHS   Fe Fum-Vit C-Vit B12-FA  1 capsule Oral QPC breakfast   furosemide  40 mg Oral Daily   gabapentin  300 mg Oral QHS   insulin aspart  0-24 Units Subcutaneous TID AC & HS   insulin detemir  30 Units Subcutaneous  Daily   methimazole  2.5 mg Oral Daily   metoprolol tartrate  25 mg Oral BID   Or   metoprolol tartrate  25 mg Per Tube BID   pantoprazole  40 mg Oral Daily   potassium chloride  40 mEq Oral Daily   sodium chloride flush  3 mL Intravenous Q12H   sodium chloride flush  3 mL Intravenous Q12H   traZODone  50 mg Oral QHS   umeclidinium bromide  1 puff Inhalation Daily   Continuous Infusions:  sodium chloride     sodium chloride     lactated ringers     lactated ringers Stopped (12/14/22 1428)   promethazine (PHENERGAN) injection (IM or IVPB)     PRN Meds:.sodium chloride, albuterol, calcium carbonate, dextrose, hydrALAZINE, metoprolol tartrate, ondansetron (ZOFRAN) IV, mouth rinse, oxyCODONE, promethazine (PHENERGAN) injection (IM or IVPB), sodium chloride flush, sodium chloride flush, traMADol  General appearance: alert, cooperative, and no distress Neurologic: intact Heart: RRR Lungs: Slightly diminished bibasilar breath sounds Abdomen: Soft, non tender, bowel sounds present Extremities: Mild LE edema Wound: Sternal and RLE wounds are clean and dry, no signs of infection  Lab Results: CBC: Recent Labs    12/15/22 0358 12/16/22 0413  WBC 12.8* 11.8*  HGB 8.6* 8.3*  HCT 28.2* 27.1*  PLT 168 173   BMET:  Recent Labs    12/15/22 0358 12/16/22 0413  NA 139 136  K 4.5 3.8  CL 105 106  CO2 26 24  GLUCOSE 142* 127*  BUN 16 13  CREATININE 0.94 0.73  CALCIUM 8.5* 8.1*    CMET: Lab Results  Component Value Date   WBC 11.8 (H) 12/16/2022   HGB 8.3 (L) 12/16/2022   HCT 27.1 (L) 12/16/2022   PLT 173 12/16/2022   GLUCOSE 127 (H) 12/16/2022   CHOL 94 12/14/2022   TRIG 58 12/14/2022   HDL 35 (L) 12/14/2022   LDLCALC 47 12/14/2022   ALT 10 12/11/2022   AST 21 12/11/2022   NA 136 12/16/2022   K 3.8 12/16/2022   CL 106 12/16/2022   CREATININE 0.73 12/16/2022   BUN 13 12/16/2022   CO2 24 12/16/2022   TSH 0.064 (L) 01/22/2021   INR 1.2 12/13/2022   HGBA1C 8.2 (H)  12/11/2022      PT/INR: No results for input(s): "LABPROT", "INR" in the last 72 hours. Radiology: No results found.   Assessment/Plan: S/P Procedure(s) (LRB): CORONARY ARTERY BYPASS GRAFTING (CABG) TIMES THREE UTILIZING LEFT INTERNAL MAMMARY ARTERY AND ENDOSCOPIC VEIN HARVEST RIGHT GREATER SAPHENOUS VEIN (N/A) TRANSESOPHAGEAL ECHOCARDIOGRAM (N/A)  CV-SR. On Lopressor 25 mg bid Pulmonary-2 liters. Continue Incruse Ellipta. Check PA/LAT CXR in am. Encourage incentive spirometer Wt 5 lbs over preop-on Lasix 40 mg daily Expected post op blood loss anemia-H and H yesterday 8.3 and 27.1. Await this am's result. Continue Trigels DM-CBGS 177/201/170. On Insulin. She was on Insulin, Metformin, and Jardiance prior to surgery. Will restart Metformin today and Jardiance closer to discharge. Pre op HGA1C  8.2. She will need close medical follow up after discharge. History of Graves disease-continue Tapazole On Lovenox for DVT prophylaxis Remove EPW LOC constipation 10. Transfer when bed available    Ardelle Balls PA-C 12/17/2022 6:57 AM   Chart reviewed, patient examined, agree with above. Continue diuresis, IS, ambulation.

## 2022-12-17 NOTE — Progress Notes (Signed)
Occupational Therapy Treatment Patient Details Name: Erica Estrada MRN: [id number] DOB: 11-13-1950 Today's Date: 12/17/2022   History of present illness Pt is a 72yo female who underwent CABGx3 on 10/3 due to CAD. PMH: DM II, HTN, HLD, CAD, arthritis, Graves disease, L eye blindness   OT comments  Pt progressing toward established OT goals. Performing UB ADL with min A and LB ADL with up to mod A. Pt may benefit from education regarding AE for LB ADL. Pt needing up to max cues to maintain sternal precautions during transfers this session. Pt to continue to benefit from education regarding maintenance of sternal precautions during ADL. Recommending discharge home with HHOT and family to assist.       If plan is discharge home, recommend the following:  A little help with walking and/or transfers;A lot of help with bathing/dressing/bathroom;Assistance with cooking/housework;Direct supervision/assist for financial management;Direct supervision/assist for medications management;Assist for transportation;Help with stairs or ramp for entrance   Equipment Recommendations  BSC/3in1    Recommendations for Other Services      Precautions / Restrictions Precautions Precautions: Sternal Precaution Booklet Issued: Yes (comment) Precaution Comments: educated pt on sternal prec via handout Restrictions Other Position/Activity Restrictions: sternal precautions       Mobility Bed Mobility                    Transfers Overall transfer level: Needs assistance Equipment used: None Transfers: Sit to/from Stand Sit to Stand: Min assist           General transfer comment: Cues for hand placement and anterior weight shift during rise and descent     Balance Overall balance assessment: Needs assistance Sitting-balance support: Feet supported Sitting balance-Leahy Scale: Good     Standing balance support: During functional activity, Reliant on assistive device for  balance Standing balance-Leahy Scale: Poor Standing balance comment: reliant on exernal support                           ADL either performed or assessed with clinical judgement   ADL Overall ADL's : Needs assistance/impaired                 Upper Body Dressing : Minimal assistance Upper Body Dressing Details (indicate cue type and reason): simulated while educating re: dressing within precautions Lower Body Dressing: Moderate assistance Lower Body Dressing Details (indicate cue type and reason): educated regarding LB ADL within precautions. Pt may benefit from DME education Toilet Transfer: Minimal assistance Toilet Transfer Details (indicate cue type and reason): cues for hand placement Toileting- Clothing Manipulation and Hygiene: Minimal assistance Toileting - Clothing Manipulation Details (indicate cue type and reason): for anterior pericare     Functional mobility during ADLs: Minimal assistance;Rolling walker (2 wheels)      Extremity/Trunk Assessment Upper Extremity Assessment Upper Extremity Assessment: Generalized weakness   Lower Extremity Assessment Lower Extremity Assessment: Defer to PT evaluation        Vision   Additional Comments: L eye blindness   Perception Perception Perception: Not tested   Praxis Praxis Praxis: Not tested    Cognition Arousal: Alert Behavior During Therapy: Flat affect Overall Cognitive Status: Impaired/Different from baseline Area of Impairment: Attention, Following commands, Memory, Problem solving                   Current Attention Level: Focused Memory: Decreased recall of precautions, Decreased short-term memory Following Commands: Follows one step commands with increased time  Problem Solving: Slow processing General Comments: Needing up to max cues to maintain sternal precautions during session and for problem solving during mobility        Exercises      Shoulder Instructions        General Comments      Pertinent Vitals/ Pain       Pain Assessment Pain Assessment: Faces Faces Pain Scale: Hurts a little bit Pain Location: chest with movement Pain Descriptors / Indicators: Grimacing Pain Intervention(s): Limited activity within patient's tolerance, Monitored during session  Home Living                                          Prior Functioning/Environment              Frequency  Min 1X/week        Progress Toward Goals  OT Goals(current goals can now be found in the care plan section)  Progress towards OT goals: Progressing toward goals  Acute Rehab OT Goals Patient Stated Goal: get better OT Goal Formulation: With patient Time For Goal Achievement: 12/30/22 Potential to Achieve Goals: Good ADL Goals Pt Will Perform Upper Body Dressing: with supervision;sitting Pt Will Perform Lower Body Dressing: with supervision;sit to/from stand;sitting/lateral leans Pt Will Transfer to Toilet: with supervision;ambulating;regular height toilet Pt Will Perform Tub/Shower Transfer: Shower transfer;Tub transfer;with supervision;ambulating;shower seat Additional ADL Goal #1: pt will perform x3 functional tasks with min cues for sternal precautions in prep for ADLs  Plan      Co-evaluation                 AM-PAC OT "6 Clicks" Daily Activity     Outcome Measure   Help from another person eating meals?: A Little Help from another person taking care of personal grooming?: A Little Help from another person toileting, which includes using toliet, bedpan, or urinal?: A Little Help from another person bathing (including washing, rinsing, drying)?: A Lot Help from another person to put on and taking off regular upper body clothing?: A Little Help from another person to put on and taking off regular lower body clothing?: A Lot 6 Click Score: 16    End of Session Equipment Utilized During Treatment: Oxygen  OT Visit Diagnosis:  Unsteadiness on feet (R26.81);Other abnormalities of gait and mobility (R26.89);Muscle weakness (generalized) (M62.81)   Activity Tolerance Patient tolerated treatment well   Patient Left in chair;with call bell/phone within reach;with family/visitor present   Nurse Communication Mobility status        Time: 1610-9604 OT Time Calculation (min): 42 min  Charges: OT General Charges $OT Visit: 1 Visit OT Treatments $Self Care/Home Management : 23-37 mins $Therapeutic Activity: 8-22 mins  Myrla Halsted, OTD, OTR/L [facility] Acute Rehabilitation Office: [phone]   Myrla Halsted 12/17/2022, 5:12 PM

## 2022-12-18 ENCOUNTER — Inpatient Hospital Stay (HOSPITAL_COMMUNITY): Payer: Medicare HMO

## 2022-12-18 LAB — GLUCOSE, CAPILLARY
Glucose-Capillary: 169 mg/dL — ABNORMAL HIGH (ref 70–99)
Glucose-Capillary: 94 mg/dL (ref 70–99)
Glucose-Capillary: 139 mg/dL — ABNORMAL HIGH (ref 70–99)
Glucose-Capillary: 95 mg/dL (ref 70–99)

## 2022-12-18 MED ORDER — PAROXETINE HCL 20 MG PO TABS
40.0000 mg | ORAL_TABLET | Freq: Every day | ORAL | Status: DC
  Administered 2022-12-18 – 2022-12-20 (×3): 40 mg via ORAL
  Filled 2022-12-18 (×4): qty 2

## 2022-12-18 MED ORDER — LIDOCAINE 5 % EX PTCH
2.0000 | MEDICATED_PATCH | CUTANEOUS | Status: DC
  Administered 2022-12-18 – 2022-12-19 (×2): 2 via TRANSDERMAL
  Filled 2022-12-18 (×2): qty 2

## 2022-12-18 MED ORDER — SODIUM CHLORIDE 0.9% FLUSH
3.0000 mL | Freq: Two times a day (BID) | INTRAVENOUS | Status: DC
  Administered 2022-12-18 – 2022-12-20 (×5): 3 mL via INTRAVENOUS

## 2022-12-18 MED ORDER — SORBITOL 70 % SOLN
30.0000 mL | Freq: Once | Status: AC
  Administered 2022-12-18: 30 mL via ORAL
  Filled 2022-12-18 (×2): qty 30

## 2022-12-18 MED FILL — Magnesium Sulfate Inj 50%: INTRAMUSCULAR | Qty: 10 | Status: AC

## 2022-12-18 MED FILL — Potassium Chloride Inj 2 mEq/ML: INTRAVENOUS | Qty: 40 | Status: AC

## 2022-12-18 MED FILL — Heparin Sodium (Porcine) Inj 1000 Unit/ML: Qty: 1000 | Status: AC

## 2022-12-18 NOTE — TOC Progression Note (Addendum)
Transition of Care (TOC) - Progression Note  Donn Pierini RN, BSN Transitions of Care Unit 4E- RN Case Manager See Treatment Team for direct phone #   Patient Details  Name: Nicolina Hirt MRN: [id number] Date of Birth: 05/19/1950  Transition of Care [facility]) CM/SW Contact  Zenda Alpers Lenn Sink, RN Phone Number: 12/18/2022, 2:36 PM  Clinical Narrative:    Orders placed for DME-BSC/3n1 and HHPT/OT needs.  CM in to speak with pt at bedside.  Discussed HH needs - pt agreeable to [facility] services- list provided for [facility] choice Per CMS guidelines from [email or web address] website with star ratings (copy placed in shadow chart)- pt voiced she would like to review list- CM to follow up tomorrow for choice and referral.  Discussed DME needs as well- pt has RW at home, voiced she needs BSC- explained that insurance may not cover BSC as pt has BR on same level of bedroom and states she can ambulate the distance to BR. Note per PT note pt did 220 ft gait distance today. Pt voiced understanding and is agreeable to in-house provider checking on insurance coverage and will contact her regarding cost.   Address, phone # and PCP all confirmed w/ pt in epic, family to transport home  CM will contact [employer] for [facility] closer to discharge to have them check on cost.   TOC will follow up for [facility] and DME needs tomorrow.    Expected Discharge Plan: Home w Home Health Services Barriers to Discharge: Continued Medical Work up  Expected Discharge Plan and Services   Discharge Planning Services: CM Consult Post Acute Care Choice: Home Health, Durable Medical Equipment Living arrangements for the past 2 months: Single Family Home                 DME Arranged: Bedside commode DME Agency: [employer]       HH Arranged: PT, OT           Social Determinants of Health (SDOH) Interventions SDOH Screenings   Food Insecurity: No Food Insecurity (12/17/2022)  Housing: Low Risk  (12/17/2022)  Transportation  Needs: Unmet Transportation Needs (12/17/2022)  Utilities: Not At Risk (12/17/2022)  Alcohol Screen: Medium Risk (11/10/2017)  Tobacco Use: Medium Risk (12/13/2022)    Readmission Risk Interventions     No data to display

## 2022-12-18 NOTE — Progress Notes (Signed)
Physical Therapy Treatment Patient Details Name: Erica Estrada MRN: [id number] DOB: 07-08-1950 Today's Date: 12/18/2022   History of Present Illness Pt is a 72yo female who underwent CABGx3 on 10/3 due to CAD. PMH: DM II, HTN, HLD, CAD, arthritis, Graves disease, L eye blindness    PT Comments  Pt received in supine and agreeable to session. Pt requires up to mod A and cues for bed mobility and standing transfers to maintain sternal precautions. Pt requesting to use the bathroom at the beginning of the session and is able to have a BM. Pt requires assist with pericare to maintain sternal precautions. Pt able to tolerate increased gait distance this session with CGA and RW support. Pt continues to be limited by impaired activity tolerance and requires multiple rest breaks throughout session. Pt continues to benefit from PT services to progress toward functional mobility goals.    If plan is discharge home, recommend the following: A little help with walking and/or transfers;A little help with bathing/dressing/bathroom;Assistance with cooking/housework;Assist for transportation   Can travel by private vehicle        Equipment Recommendations  None recommended by PT    Recommendations for Other Services       Precautions / Restrictions Precautions Precautions: Sternal Restrictions Weight Bearing Restrictions: Yes Other Position/Activity Restrictions: sternal precautions     Mobility  Bed Mobility Overal bed mobility: Needs Assistance Bed Mobility: Sidelying to Sit, Sit to Sidelying   Sidelying to sit: Mod assist, HOB elevated     Sit to sidelying: Mod assist General bed mobility comments: mod A for trunk elevation and BLE elevation back to EOB    Transfers Overall transfer level: Needs assistance Equipment used: None Transfers: Sit to/from Stand Sit to Stand: Min assist, Mod assist           General transfer comment: Cues for anterior weight shift and min-mod A for  power up    Ambulation/Gait Ambulation/Gait assistance: Contact guard assist Gait Distance (Feet): 220 Feet Assistive device: Rolling walker (2 wheels) Gait Pattern/deviations: Step-through pattern, Decreased stride length, Trunk flexed Gait velocity: dec     General Gait Details: Cues for RW management and upright posture. CGA for safety      Balance Overall balance assessment: Needs assistance Sitting-balance support: Feet supported Sitting balance-Leahy Scale: Good Sitting balance - Comments: sitting EOB   Standing balance support: During functional activity, Reliant on assistive device for balance, Bilateral upper extremity supported Standing balance-Leahy Scale: Poor Standing balance comment: with RW support                            Cognition Arousal: Alert Behavior During Therapy: WFL for tasks assessed/performed Overall Cognitive Status: Impaired/Different from baseline                                 General Comments: increased cues for sternal precautions throughout        Exercises      General Comments General comments (skin integrity, edema, etc.): VSS on RA      Pertinent Vitals/Pain Pain Assessment Pain Assessment: Faces Faces Pain Scale: Hurts even more Pain Location: chest and back with movement Pain Descriptors / Indicators: Grimacing, Guarding Pain Intervention(s): Limited activity within patient's tolerance, Monitored during session, Repositioned     PT Goals (current goals can now be found in the care plan section) Acute Rehab PT Goals Patient  Stated Goal: get better PT Goal Formulation: With patient Time For Goal Achievement: 12/30/22 Progress towards PT goals: Progressing toward goals    Frequency    Min 1X/week       AM-PAC PT "6 Clicks" Mobility   Outcome Measure  Help needed turning from your back to your side while in a flat bed without using bedrails?: A Little Help needed moving from lying  on your back to sitting on the side of a flat bed without using bedrails?: A Lot Help needed moving to and from a bed to a chair (including a wheelchair)?: A Little Help needed standing up from a chair using your arms (e.g., wheelchair or bedside chair)?: A Little Help needed to walk in hospital room?: A Little Help needed climbing 3-5 steps with a railing? : A Lot 6 Click Score: 16    End of Session Equipment Utilized During Treatment: Gait belt Activity Tolerance: Patient tolerated treatment well Patient left: in bed;with nursing/sitter in room;with family/visitor present;with call bell/phone within reach Nurse Communication: Mobility status PT Visit Diagnosis: Unsteadiness on feet (R26.81);Difficulty in walking, not elsewhere classified (R26.2)     Time: 7829-5621 PT Time Calculation (min) (ACUTE ONLY): 32 min  Charges:    $Gait Training: 8-22 mins $Therapeutic Activity: 8-22 mins PT General Charges $$ ACUTE PT VISIT: 1 Visit                     Johny Shock, PTA Acute Rehabilitation Services Secure Chat Preferred  [phone]    Johny Shock 12/18/2022, 11:04 AM

## 2022-12-18 NOTE — Progress Notes (Addendum)
Mobility Specialist Progress Note:   12/18/22 1500  Mobility  Activity Ambulated with assistance in hallway  Level of Assistance Minimal assist, patient does 75% or more  Assistive Device Front wheel walker  Distance Ambulated (ft) 200 ft  RUE Weight Bearing NWB  LUE Weight Bearing NWB  Activity Response Tolerated well  Mobility Referral Yes  $Mobility charge 1 Mobility  Mobility Specialist Start Time (ACUTE ONLY) 1430  Mobility Specialist Stop Time (ACUTE ONLY) 1450  Mobility Specialist Time Calculation (min) (ACUTE ONLY) 20 min   Pre Mobility: 76 HR , 93% SpO2 During Mobility: 83 HR , 94% -100% SpO2 Post Mobility: 80 HR , 100% SpO2  Pt received in bed, agreeable to mobility. MinA to stand. CG during ambulation. Standing rest break required d/t slight lightheadedness, otherwise asymptomatic throughout. Pt denied any SOB or other discomfort. Pt returned to bed with call bell in reach and all needs met.  Leory Plowman  Mobility Specialist Please contact via [employer] office at [phone]

## 2022-12-18 NOTE — Care Management Important Message (Signed)
Important Message  Patient Details  Name: Erica Estrada MRN: [id number] Date of Birth: 10-04-1950   Important Message Given:  Yes - Medicare IM     Sherilyn Banker 12/18/2022, 4:02 PM

## 2022-12-18 NOTE — Progress Notes (Addendum)
      [address]       [place] [address]             [phone]        5 Days Post-Op Procedure(s) (LRB): CORONARY ARTERY BYPASS GRAFTING (CABG) TIMES THREE UTILIZING LEFT INTERNAL MAMMARY ARTERY AND ENDOSCOPIC VEIN HARVEST RIGHT GREATER SAPHENOUS VEIN (N/A) TRANSESOPHAGEAL ECHOCARDIOGRAM (N/A)  Subjective: Patient just finished eating breakfast. She did not have a bowel movement yesterday. She did walk yesterday. She has no specific complaint this am.  Objective: Vital signs in last 24 hours: Temp:  [98.3 F (36.8 C)-98.8 F (37.1 C)] 98.7 F (37.1 C) (10/08 0317) Pulse Rate:  [60-88] 66 (10/08 0317) Cardiac Rhythm: Sinus bradycardia;Junctional rhythm (10/08 0102) Resp:  [11-24] 15 (10/08 0317) BP: (72-138)/(54-89) 102/56 (10/08 0317) SpO2:  [91 %-98 %] 96 % (10/08 0317) Weight:  [76 kg] 76 kg (10/08 0436)  Pre op weight 74.4 kg Current Weight  12/18/22 76 kg      Intake/Output from previous day: 10/07 0701 - 10/08 0700 In: 1170 [P.O.:1170] Out: 850 [Urine:850]   Physical Exam:  Cardiovascular: RRR Pulmonary: Clear to auscultation bilaterally Abdomen: Soft, non tender, bowel sounds present. Extremities: Trace  bilateral lower extremity edema. Wounds: Clean and dry.  No erythema or signs of infection.  Lab Results: CBC: Recent Labs    12/16/22 0413 12/17/22 0834  WBC 11.8* 9.6  HGB 8.3* 8.2*  HCT 27.1* 26.6*  PLT 173 230   BMET:  Recent Labs    12/16/22 0413 12/17/22 0834  NA 136 134*  K 3.8 4.2  CL 106 103  CO2 24 21*  GLUCOSE 127* 200*  BUN 13 16  CREATININE 0.73 0.79  CALCIUM 8.1* 8.4*    PT/INR:  Lab Results  Component Value Date   INR 1.2 12/13/2022   INR 0.9 12/11/2022   ABG:  INR: Will add last result for INR, ABG once components are confirmed Will add last 4 CBG results once components are confirmed  Assessment/Plan: CV-SR. On Lopressor 25 mg bid Pulmonary-On room air. Continue Incruse Ellipta. PA/LAT  CXR in am. Encourage incentive spirometer Wt 5 lbs over preop-on Lasix 40 mg daily Expected post op blood loss anemia-Last  H and H 8.2 and 26.6.  Continue Trigels DM-CBGS [id number]. On Insulin and Metformin. Will restart Jardiance closer to discharge. Pre op HGA1C  8.2. She will need close medical follow up after discharge. History of Graves disease-continue Tapazole On Lovenox for DVT prophylaxis 8. Will try Sorbitol for constipation 9. Disposition-Probable discharge in next couple of days. Would like to see improvement in ambulation. Home PT/OT ordered   Donielle M ZimmermanPA-C 6:59 AM   Chart reviewed, patient examined, agree with above. Needs to continue work on ambulation. She needs a lot of encouragement to keep moving.

## 2022-12-19 LAB — GLUCOSE, CAPILLARY
Glucose-Capillary: 163 mg/dL — ABNORMAL HIGH (ref 70–99)
Glucose-Capillary: 115 mg/dL — ABNORMAL HIGH (ref 70–99)
Glucose-Capillary: 131 mg/dL — ABNORMAL HIGH (ref 70–99)
Glucose-Capillary: 87 mg/dL (ref 70–99)

## 2022-12-19 MED ORDER — FUROSEMIDE 40 MG PO TABS
40.0000 mg | ORAL_TABLET | Freq: Every day | ORAL | Status: DC
  Administered 2022-12-19 – 2022-12-20 (×2): 40 mg via ORAL
  Filled 2022-12-19 (×2): qty 1

## 2022-12-19 MED ORDER — INSULIN DETEMIR 100 UNIT/ML ~~LOC~~ SOLN
18.0000 [IU] | Freq: Every day | SUBCUTANEOUS | Status: DC
  Administered 2022-12-19 – 2022-12-20 (×2): 18 [IU] via SUBCUTANEOUS
  Filled 2022-12-19 (×2): qty 0.18

## 2022-12-19 MED ORDER — POTASSIUM CHLORIDE CRYS ER 20 MEQ PO TBCR
20.0000 meq | EXTENDED_RELEASE_TABLET | Freq: Every day | ORAL | Status: DC
  Administered 2022-12-19 – 2022-12-20 (×2): 20 meq via ORAL
  Filled 2022-12-19 (×2): qty 1

## 2022-12-19 MED ORDER — INSULIN DETEMIR 100 UNIT/ML ~~LOC~~ SOLN: 20.0000 [IU] | Freq: Every day | SUBCUTANEOUS | Status: DC

## 2022-12-19 NOTE — Progress Notes (Addendum)
      [address]       [place] [address]             [phone]        6 Days Post-Op Procedure(s) (LRB): CORONARY ARTERY BYPASS GRAFTING (CABG) TIMES THREE UTILIZING LEFT INTERNAL MAMMARY ARTERY AND ENDOSCOPIC VEIN HARVEST RIGHT GREATER SAPHENOUS VEIN (N/A) TRANSESOPHAGEAL ECHOCARDIOGRAM (N/A)  Subjective: Patient had bowel movement this am.  Objective: Vital signs in last 24 hours: Temp:  [98.1 F (36.7 C)-99.3 F (37.4 C)] 99.3 F (37.4 C) (10/09 0415) Pulse Rate:  [70-85] 85 (10/09 0415) Cardiac Rhythm: Normal sinus rhythm;Bundle branch block (10/08 2002) Resp:  [15-20] 20 (10/09 0415) BP: (110-130)/(53-71) 125/58 (10/09 0415) SpO2:  [88 %-100 %] 96 % (10/09 0415) Weight:  [75.7 kg] 75.7 kg (10/09 0406)  Pre op weight 74..4 kg Current Weight  12/19/22 75.7 kg       Intake/Output from previous day: 10/08 0701 - 10/09 0700 In: 240 [P.O.:240] Out: -    Physical Exam:  Cardiovascular: RRR Pulmonary: Clear to auscultation bilaterally; Abdomen: Soft, non tender, bowel sounds present. Extremities: Trace bilateral lower extremity edema. Wounds: Clean and dry.  No erythema or signs of infection.  Lab Results: CBC: Recent Labs    12/17/22 0834  WBC 9.6  HGB 8.2*  HCT 26.6*  PLT 230   BMET:  Recent Labs    12/17/22 0834  NA 134*  K 4.2  CL 103  CO2 21*  GLUCOSE 200*  BUN 16  CREATININE 0.79  CALCIUM 8.4*    PT/INR:  Lab Results  Component Value Date   INR 1.2 12/13/2022   INR 0.9 12/11/2022   ABG:  INR: Will add last result for INR, ABG once components are confirmed Will add last 4 CBG results once components are confirmed  Assessment/Plan: CV-SR. On Lopressor 25 mg bid Pulmonary-On room air. Continue Incruse Ellipta. PA/LAT CXR in am. Encourage incentive spirometer Wt 5 lbs over preop-on Lasix 40 mg daily Expected post op blood loss anemia-Last  H and H 8.2 and 26.6.  Continue Trigels DM-CBGS 139/95/163. On  Insulin but will decreased to avoid hypoglycemia and Metformin. Will restart Jardiance closer to discharge. Pre op HGA1C  8.2. She will need close medical follow up after discharge. History of Graves disease-continue Tapazole On Lovenox for DVT prophylaxis 8. Disposition-Discharge in next couple of days. Would like to see improvement in ambulation;she needs a lot of encouragement. Home PT/OT ordered    Donielle M ZimmermanPA-C 7:07 AM   Chart reviewed, patient examined, agree with above. She will benefit from more ambulation prior to discharge.

## 2022-12-19 NOTE — TOC Progression Note (Signed)
Transition of Care [facility]) - Progression Note    Patient Details  Name: Erica Estrada MRN: [id number] Date of Birth: March 24, 1950  [facility] CM/SW Contact  Lawerance Sabal, RN Phone Number: 12/19/2022, 4:08 PM  Clinical Narrative:     Reviewed medicare.gov rating list for home health agencies with patient and visitor at bedside. She is agreeable to [facility] services and would like to use [facility]. Referral placed to liaison and case accepted.  Patient understands that [facility] will call her in 1-2 days to set up services at home.   Expected Discharge Plan: Home w Home Health Services Barriers to Discharge: Continued Medical Work up  Expected Discharge Plan and Services   Discharge Planning Services: CM Consult Post Acute Care Choice: Home Health, Durable Medical Equipment Living arrangements for the past 2 months: Single Family Home                 DME Arranged: Bedside commode DME Agency: [facility]       HH Arranged: PT, OT HH Agency: [facility] Date [facility] Agency Contacted: 12/19/22 Time HH Agency Contacted: 1608 Representative spoke with at [facility] Agency: Kandee Keen   Social Determinants of Health (SDOH) Interventions SDOH Screenings   Food Insecurity: No Food Insecurity (12/17/2022)  Housing: Low Risk  (12/17/2022)  Transportation Needs: Unmet Transportation Needs (12/17/2022)  Utilities: Not At Risk (12/17/2022)  Alcohol Screen: Medium Risk (11/10/2017)  Tobacco Use: Medium Risk (12/13/2022)    Readmission Risk Interventions     No data to display

## 2022-12-19 NOTE — Progress Notes (Signed)
CARDIAC REHAB PHASE I   PRE:  Rate/Rhythm: 77 SR    BP: sitting     SpO2: 95 RA  MODE:  Ambulation: 280 ft   POST:  Rate/Rhythm: 83 SR    BP: sitting 127/69     SpO2: 95 RA  Pt at sink after using bathroom. Stood and ambulated hall with contact guard. Steady with RW. Fatigue with distance but was able to walk farther than yesterday. To recliner. Stood again to adjust chair. Pt needs reminders for moving hips forward following sternal precautions. Practiced IS, 750 ml. Encouraged more walking.  [id number]   Ethelda Chick BS, ACSM-CEP 12/19/2022 11:41 AM

## 2022-12-19 NOTE — Progress Notes (Signed)
Pt ambulated approx 250' with front wheel walker and one assist.  Needed some assistance standing from bed. Stopped about mid-way through walk to rest, otherwise tolerated well.  Back to bed upon return to room.  Family at bedside, call bell in reach.

## 2022-12-19 NOTE — Progress Notes (Signed)
Mobility Specialist Progress Note:   12/19/22 1552  Mobility  Activity Refused mobility   Pt refused mobility. Will f/u as able.    Leory Plowman  Mobility Specialist Please contact via [employer] office at [phone]

## 2022-12-19 NOTE — Progress Notes (Signed)
Occupational Therapy Treatment Patient Details Name: Erica Estrada MRN: [id number] DOB: Sep 12, 1950 Today's Date: 12/19/2022   History of present illness Pt is a 72yo female who underwent CABGx3 on 10/3 due to CAD. PMH: DM II, HTN, HLD, CAD, arthritis, Graves disease, L eye blindness   OT comments  Pt with good progress toward established OT goals this session. Continues to require significant assist for bed mobility and light assist for transfers. Focus session on continued education and implementation of sternal precautions during ADL. Pt with improved self-application, but continues to need up to min cues. Continue to recommend continued OT after discharge in home setting with family assisting as needed.       If plan is discharge home, recommend the following:  A little help with walking and/or transfers;A lot of help with bathing/dressing/bathroom;Assistance with cooking/housework;Direct supervision/assist for financial management;Direct supervision/assist for medications management;Assist for transportation;Help with stairs or ramp for entrance   Equipment Recommendations  BSC/3in1    Recommendations for Other Services      Precautions / Restrictions Precautions Precautions: Sternal Precaution Booklet Issued: Yes (comment) Precaution Comments: Pt able to generally recall sternal precautions but needs intermittent cues for maintenance of precautions during mobility and ADL. Restrictions Other Position/Activity Restrictions: sternal precautions       Mobility Bed Mobility Overal bed mobility: Needs Assistance Bed Mobility: Sidelying to Sit, Sit to Sidelying   Sidelying to sit: Mod assist, HOB elevated     Sit to sidelying: Mod assist General bed mobility comments: cues for hand/arm placement and maintaining grasp on heart pillow. Pt needing significant assist for truncal elevation to EOB and assist to bring BLE back into bed and reposition    Transfers Overall transfer  level: Needs assistance Equipment used: Rolling walker (2 wheels), None Transfers: Sit to/from Stand Sit to Stand: Min assist           General transfer comment: min A for power up and steady     Balance Overall balance assessment: Needs assistance Sitting-balance support: Feet supported Sitting balance-Leahy Scale: Good Sitting balance - Comments: sitting EOB   Standing balance support: During functional activity, Reliant on assistive device for balance, Bilateral upper extremity supported Standing balance-Leahy Scale: Poor                             ADL either performed or assessed with clinical judgement   ADL Overall ADL's : Needs assistance/impaired     Grooming: Supervision/safety;Standing;Wash/dry hands           Upper Body Dressing : Minimal assistance Upper Body Dressing Details (indicate cue type and reason): simulated at EOB and pt able to generally recall technique with min cues in relation to how arms go into sleeves Lower Body Dressing: Minimal assistance;Sitting/lateral leans Lower Body Dressing Details (indicate cue type and reason): for socks with figure 4; improved ability perform figure 4 Toilet Transfer: Minimal assistance Toilet Transfer Details (indicate cue type and reason): min A for end of rise and initial steady Toileting- Clothing Manipulation and Hygiene: Minimal assistance;Sit to/from stand Toileting - Clothing Manipulation Details (indicate cue type and reason): A for rise only; CGA for act of pericare     Functional mobility during ADLs: Contact guard assist;Rolling walker (2 wheels)      Extremity/Trunk Assessment Upper Extremity Assessment Upper Extremity Assessment: Generalized weakness   Lower Extremity Assessment Lower Extremity Assessment: Defer to PT evaluation        Vision  Perception     Praxis      Cognition Arousal: Alert Behavior During Therapy: WFL for tasks assessed/performed Overall  Cognitive Status: Impaired/Different from baseline Area of Impairment: Problem solving, Following commands                       Following Commands: Follows one step commands with increased time     Problem Solving: Slow processing General Comments: min cues for sternal precautions throughout; greater problem solving and self-application of precautions this session as compared to previous session        Exercises      Shoulder Instructions       General Comments VSS on RA    Pertinent Vitals/ Pain       Pain Assessment Pain Assessment: Faces Faces Pain Scale: Hurts a little bit Pain Location: chest with mobility Pain Descriptors / Indicators: Grimacing, Guarding Pain Intervention(s): Monitored during session, Limited activity within patient's tolerance  Home Living                                          Prior Functioning/Environment              Frequency  Min 1X/week        Progress Toward Goals  OT Goals(current goals can now be found in the care plan section)  Progress towards OT goals: Progressing toward goals  Acute Rehab OT Goals Patient Stated Goal: do things for myself OT Goal Formulation: With patient Time For Goal Achievement: 12/30/22 Potential to Achieve Goals: Good ADL Goals Pt Will Perform Upper Body Dressing: with supervision;sitting Pt Will Perform Lower Body Dressing: with supervision;sit to/from stand;sitting/lateral leans Pt Will Transfer to Toilet: with supervision;ambulating;regular height toilet Pt Will Perform Tub/Shower Transfer: Shower transfer;Tub transfer;with supervision;ambulating;shower seat Additional ADL Goal #1: pt will perform x3 functional tasks with min cues for sternal precautions in prep for ADLs  Plan      Co-evaluation                 AM-PAC OT "6 Clicks" Daily Activity     Outcome Measure   Help from another person eating meals?: A Little Help from another person taking  care of personal grooming?: A Little Help from another person toileting, which includes using toliet, bedpan, or urinal?: A Little Help from another person bathing (including washing, rinsing, drying)?: A Lot Help from another person to put on and taking off regular upper body clothing?: A Little Help from another person to put on and taking off regular lower body clothing?: A Lot 6 Click Score: 16    End of Session Equipment Utilized During Treatment: Gait belt;Rolling walker (2 wheels)  OT Visit Diagnosis: Unsteadiness on feet (R26.81);Other abnormalities of gait and mobility (R26.89);Muscle weakness (generalized) (M62.81)   Activity Tolerance Patient tolerated treatment well   Patient Left in chair;with call bell/phone within reach;with family/visitor present   Nurse Communication Mobility status        Time: 1610-9604 OT Time Calculation (min): 22 min  Charges: OT General Charges $OT Visit: 1 Visit OT Treatments $Self Care/Home Management : 8-22 mins  Tyler Deis, OTR/L [facility] Acute Rehabilitation Office: [phone]   Myrla Halsted 12/19/2022, 9:49 AM

## 2022-12-20 LAB — GLUCOSE, CAPILLARY: Glucose-Capillary: 77 mg/dL (ref 70–99)

## 2022-12-20 MED ORDER — METOPROLOL TARTRATE 25 MG PO TABS: 25.0000 mg | ORAL_TABLET | Freq: Two times a day (BID) | ORAL | 1 refills | Status: DC

## 2022-12-20 MED ORDER — FUROSEMIDE 40 MG PO TABS: 40.0000 mg | ORAL_TABLET | Freq: Every day | ORAL | 0 refills | Status: DC

## 2022-12-20 MED ORDER — HYDROCODONE-ACETAMINOPHEN 5-325 MG PO TABS: 1.0000 | ORAL_TABLET | Freq: Four times a day (QID) | ORAL | 0 refills | Status: DC | PRN

## 2022-12-20 MED ORDER — POTASSIUM CHLORIDE CRYS ER 20 MEQ PO TBCR: 20.0000 meq | EXTENDED_RELEASE_TABLET | Freq: Every day | ORAL | 0 refills | Status: DC

## 2022-12-20 MED ORDER — HYDROCODONE-ACETAMINOPHEN 5-325 MG PO TABS: 1.0000 | ORAL_TABLET | ORAL | 0 refills | Status: DC | PRN

## 2022-12-20 MED ORDER — ASPIRIN 325 MG PO TBEC: 325.0000 mg | DELAYED_RELEASE_TABLET | Freq: Every day | ORAL | Status: DC

## 2022-12-20 MED FILL — Lidocaine HCl Local Soln Prefilled Syringe 100 MG/5ML (2%): INTRAMUSCULAR | Qty: 5 | Status: AC

## 2022-12-20 MED FILL — Electrolyte-R (PH 7.4) Solution: INTRAVENOUS | Qty: 4000 | Status: AC

## 2022-12-20 MED FILL — Sodium Chloride IV Soln 0.9%: INTRAVENOUS | Qty: 2000 | Status: AC

## 2022-12-20 MED FILL — Sodium Bicarbonate IV Soln 8.4%: INTRAVENOUS | Qty: 50 | Status: AC

## 2022-12-20 MED FILL — Mannitol IV Soln 20%: INTRAVENOUS | Qty: 500 | Status: AC

## 2022-12-20 NOTE — Progress Notes (Signed)
      [address]       [place] [address]             [phone]        7 Days Post-Op Procedure(s) (LRB): CORONARY ARTERY BYPASS GRAFTING (CABG) TIMES THREE UTILIZING LEFT INTERNAL MAMMARY ARTERY AND ENDOSCOPIC VEIN HARVEST RIGHT GREATER SAPHENOUS VEIN (N/A) TRANSESOPHAGEAL ECHOCARDIOGRAM (N/A)  Subjective: Patient walked over 250 feet yesterday. She has no specific complaint this am.  Objective: Vital signs in last 24 hours: Temp:  [98.1 F (36.7 C)-99.6 F (37.6 C)] 98.6 F (37 C) (10/10 0434) Pulse Rate:  [66-89] 84 (10/10 0434) Cardiac Rhythm: Normal sinus rhythm (10/09 1902) Resp:  [14-20] 20 (10/10 0434) BP: (112-142)/(51-83) 129/61 (10/10 0434) SpO2:  [92 %-97 %] 95 % (10/10 0434) Weight:  [74.4 kg] 74.4 kg (10/10 0429)  Pre op weight 74..4 kg Current Weight  12/20/22 74.4 kg       Intake/Output from previous day: 10/09 0701 - 10/10 0700 In: 240 [P.O.:240] Out: -    Physical Exam:  Cardiovascular: RRR Pulmonary: Clear to auscultation bilaterally; Abdomen: Soft, non tender, bowel sounds present. Extremities: Trace bilateral lower extremity edema. Wounds: Clean and dry.  No erythema or signs of infection.  Lab Results: CBC: Recent Labs    12/17/22 0834  WBC 9.6  HGB 8.2*  HCT 26.6*  PLT 230   BMET:  Recent Labs    12/17/22 0834  NA 134*  K 4.2  CL 103  CO2 21*  GLUCOSE 200*  BUN 16  CREATININE 0.79  CALCIUM 8.4*    PT/INR:  Lab Results  Component Value Date   INR 1.2 12/13/2022   INR 0.9 12/11/2022   ABG:  INR: Will add last result for INR, ABG once components are confirmed Will add last 4 CBG results once components are confirmed  Assessment/Plan: CV-SR. On Lopressor 25 mg bid Pulmonary-On room air. Continue Incruse Ellipta. Encourage incentive spirometer Mild volume overload-on Lasix 40 mg daily Expected post op blood loss anemia-Last  H and H 8.2 and 26.6.  Continue Trigels DM-CBGS 131/87/77. On  Insulin but will further decrease to avoid hypoglycemia and Metformin. Will restart Jardiance at discharge. Pre op HGA1C  8.2. She will need close medical follow up after discharge. History of Graves disease-continue Tapazole On Lovenox for DVT prophylaxis 8. Disposition-Ambulation improving. Will ambulate this am.Home PT/OT ordered. Possible discharge later today vs am    Shonica Weier M ZimmermanPA-C 7:06 AM

## 2022-12-20 NOTE — Progress Notes (Signed)
CARDIAC REHAB PHASE I   PRE:  Rate/Rhythm: 80 SR   BP:  Sitting: 97/62      SaO2: 96 RA   MODE:  Ambulation: 240 ft   POST:  Rate/Rhythm: 88 SR   BP:  Sitting: 110/60      SaO2: 98 RA  Pt ambulated in hallway, using front wheel walker. Pt mobilizing well, using proper sternal precautions. Pt placed on RA O2 saturations 90-98% for duration of visit, pt denied. Ivin Booty RN made aware of oxygen change. Pt tolerated well taking several short standing breaks. Denied SOB, pain or dizziness. Returned to bed with call bell and bedside table in reach. Post OHS education including site care, restrictions, heart healthy diabetic diet, sternal precautions, IS use at home, home needs at discharge, exercise guidelines and CRP2 reviewed. All questions and concerns addressed. Will refer to [facility] for CRP2. Plan for home later today or tomorrow am.   [id number] Woodroe Chen, RN BSN 12/20/2022 9:47 AM

## 2022-12-20 NOTE — TOC Transition Note (Signed)
Transition of Care (TOC) - CM/SW Discharge Note Donn Pierini RN, BSN Transitions of Care Unit 4E- RN Case Manager See Treatment Team for direct phone #   Patient Details  Name: Erica Estrada MRN: [id number] Date of Birth: 22-Sep-1950  Transition of Care [facility]) CM/SW Contact:  Darrold Span, RN Phone Number: 12/20/2022, 11:48 AM   Clinical Narrative:    Pt stable for transition home today, DME- BSC has been ordered and to be delivered to room this am prior to discharge per [employer].   HH set up with [facility] for HHPT/OT- liasion to follow for scheduling.   Family to transport home .  No further TOC needs noted.    Final next level of care: Home w Home Health Services Barriers to Discharge: Barriers Resolved   Patient Goals and CMS Choice CMS Medicare.gov Compare Post Acute Care list provided to:: Patient Choice offered to / list presented to : Patient  Discharge Placement               Home w/ [facility]          Discharge Plan and Services Additional resources added to the After Visit Summary for     Discharge Planning Services: CM Consult Post Acute Care Choice: Home Health, Durable Medical Equipment          DME Arranged: Bedside commode DME Agency: [employer] Date DME Agency Contacted: 12/19/22 Time DME Agency Contacted: 1130 Representative spoke with at DME Agency: Zack HH Arranged: PT, OT HH Agency: [facility] Date [facility] Agency Contacted: 12/19/22 Time HH Agency Contacted: 1608 Representative spoke with at [facility] Agency: Kandee Keen  Social Determinants of Health (SDOH) Interventions SDOH Screenings   Food Insecurity: No Food Insecurity (12/17/2022)  Housing: Low Risk  (12/17/2022)  Transportation Needs: Unmet Transportation Needs (12/17/2022)  Utilities: Not At Risk (12/17/2022)  Alcohol Screen: Medium Risk (11/10/2017)  Tobacco Use: Medium Risk (12/13/2022)     Readmission Risk Interventions    12/20/2022   11:48 AM  Readmission Risk Prevention  Plan  Post Dischage Appt Complete  Medication Screening Complete  Transportation Screening Complete

## 2022-12-20 NOTE — Progress Notes (Signed)
Patient discharged from unit  medications and property returned to designated person. Discharge instructions reviewed and patient questions answered.

## 2022-12-20 NOTE — TOC CM/SW Note (Signed)
Due to patient continuing to require significant assist for bed mobility and transfers post op, a bedside commode would help prevent and reduce the risk of falls. Patient at times as an urgent need to get to bathroom due to medications.

## 2022-12-21 MED FILL — Heparin Sodium (Porcine) Inj 1000 Unit/ML: INTRAMUSCULAR | Qty: 2.5 | Status: AC

## 2022-12-21 MED FILL — Papaverine HCl Inj 30 MG/ML: INTRAMUSCULAR | Qty: 1 | Status: AC

## 2022-12-21 MED FILL — Gelatin Absorbable MT Powder: OROMUCOSAL | Qty: 1 | Status: AC

## 2022-12-21 MED FILL — Electrolyte-A Solution: INTRAVENOUS | Qty: 500 | Status: AC

## 2022-12-22 LAB — BPAM RBC
Blood Product Expiration Date: 202410292359
Blood Product Expiration Date: 202410292359
ISSUE DATE / TIME: 202409301022
Unit Type and Rh: 5100
Unit Type and Rh: 5100

## 2022-12-22 LAB — TYPE AND SCREEN
ABO/RH(D): O POS
Antibody Screen: NEGATIVE
Unit division: 0
Unit division: 0

## 2022-12-24 ENCOUNTER — Observation Stay (HOSPITAL_COMMUNITY)
Admission: EM | Admit: 2022-12-24 | Discharge: 2022-12-26 | Disposition: A | Discharge status: Home Health | Payer: Medicare HMO | Attending: Family Medicine | Admitting: Family Medicine

## 2022-12-24 ENCOUNTER — Other Ambulatory Visit: Payer: Self-pay

## 2022-12-24 ENCOUNTER — Emergency Department (HOSPITAL_COMMUNITY): Payer: Medicare HMO

## 2022-12-24 ENCOUNTER — Encounter (HOSPITAL_COMMUNITY): Payer: Self-pay | Admitting: *Deleted

## 2022-12-24 DIAGNOSIS — R0789 Other chest pain: Principal | ICD-10-CM | POA: Insufficient documentation

## 2022-12-24 DIAGNOSIS — R0781 Pleurodynia: Secondary | ICD-10-CM | POA: Diagnosis not present

## 2022-12-24 DIAGNOSIS — R700 Elevated erythrocyte sedimentation rate: Secondary | ICD-10-CM

## 2022-12-24 DIAGNOSIS — I110 Hypertensive heart disease with heart failure: Secondary | ICD-10-CM | POA: Insufficient documentation

## 2022-12-24 DIAGNOSIS — E119 Type 2 diabetes mellitus without complications: Secondary | ICD-10-CM | POA: Diagnosis not present

## 2022-12-24 DIAGNOSIS — D649 Anemia, unspecified: Secondary | ICD-10-CM | POA: Diagnosis not present

## 2022-12-24 DIAGNOSIS — Z7984 Long term (current) use of oral hypoglycemic drugs: Secondary | ICD-10-CM | POA: Diagnosis not present

## 2022-12-24 DIAGNOSIS — J45909 Unspecified asthma, uncomplicated: Secondary | ICD-10-CM | POA: Diagnosis not present

## 2022-12-24 DIAGNOSIS — R9431 Abnormal electrocardiogram [ECG] [EKG]: Secondary | ICD-10-CM | POA: Diagnosis not present

## 2022-12-24 DIAGNOSIS — R7989 Other specified abnormal findings of blood chemistry: Secondary | ICD-10-CM | POA: Diagnosis not present

## 2022-12-24 DIAGNOSIS — I10 Essential (primary) hypertension: Secondary | ICD-10-CM | POA: Diagnosis present

## 2022-12-24 DIAGNOSIS — Z87891 Personal history of nicotine dependence: Secondary | ICD-10-CM | POA: Diagnosis not present

## 2022-12-24 DIAGNOSIS — Z79899 Other long term (current) drug therapy: Secondary | ICD-10-CM | POA: Diagnosis not present

## 2022-12-24 DIAGNOSIS — G8918 Other acute postprocedural pain: Principal | ICD-10-CM

## 2022-12-24 DIAGNOSIS — Z7982 Long term (current) use of aspirin: Secondary | ICD-10-CM | POA: Diagnosis not present

## 2022-12-24 DIAGNOSIS — I5032 Chronic diastolic (congestive) heart failure: Secondary | ICD-10-CM | POA: Diagnosis present

## 2022-12-24 DIAGNOSIS — R079 Chest pain, unspecified: Secondary | ICD-10-CM | POA: Diagnosis present

## 2022-12-24 DIAGNOSIS — F32A Depression, unspecified: Secondary | ICD-10-CM | POA: Diagnosis present

## 2022-12-24 DIAGNOSIS — F331 Major depressive disorder, recurrent, moderate: Secondary | ICD-10-CM

## 2022-12-24 DIAGNOSIS — I4581 Long QT syndrome: Secondary | ICD-10-CM | POA: Diagnosis not present

## 2022-12-24 DIAGNOSIS — Z951 Presence of aortocoronary bypass graft: Secondary | ICD-10-CM | POA: Diagnosis not present

## 2022-12-24 DIAGNOSIS — Z794 Long term (current) use of insulin: Secondary | ICD-10-CM | POA: Insufficient documentation

## 2022-12-24 DIAGNOSIS — I2510 Atherosclerotic heart disease of native coronary artery without angina pectoris: Secondary | ICD-10-CM | POA: Insufficient documentation

## 2022-12-24 DIAGNOSIS — L8990 Pressure ulcer of unspecified site, unspecified stage: Secondary | ICD-10-CM | POA: Insufficient documentation

## 2022-12-24 DIAGNOSIS — E785 Hyperlipidemia, unspecified: Secondary | ICD-10-CM | POA: Diagnosis present

## 2022-12-24 LAB — CBC
HCT: 30.2 % — ABNORMAL LOW (ref 36.0–46.0)
Hemoglobin: 9.1 g/dL — ABNORMAL LOW (ref 12.0–15.0)
MCH: 27.2 pg (ref 26.0–34.0)
MCHC: 30.1 g/dL (ref 30.0–36.0)
MCV: 90.4 fL (ref 80.0–100.0)
Platelets: 560 10*3/uL — ABNORMAL HIGH (ref 150–400)
RBC: 3.34 MIL/uL — ABNORMAL LOW (ref 3.87–5.11)
RDW: 13.8 % (ref 11.5–15.5)
WBC: 11.1 10*3/uL — ABNORMAL HIGH (ref 4.0–10.5)
nRBC: 0.2 % (ref 0.0–0.2)

## 2022-12-24 LAB — BASIC METABOLIC PANEL
Anion gap: 13 (ref 5–15)
BUN: <5 mg/dL — ABNORMAL LOW (ref 8–23)
CO2: 27 mmol/L (ref 22–32)
Calcium: 9 mg/dL (ref 8.9–10.3)
Chloride: 101 mmol/L (ref 98–111)
Creatinine, Ser: 0.76 mg/dL (ref 0.44–1.00)
GFR, Estimated: >60 mL/min (ref >60)
Glucose, Bld: 161 mg/dL — ABNORMAL HIGH (ref 70–99)
Potassium: 3.9 mmol/L (ref 3.5–5.1)
Sodium: 141 mmol/L (ref 135–145)

## 2022-12-24 LAB — RETICULOCYTES
Immature Retic Fract: 23.6 % — ABNORMAL HIGH (ref 2.3–15.9)
RBC.: 3.38 MIL/uL — ABNORMAL LOW (ref 3.87–5.11)
Retic Count, Absolute: 116.3 10*3/uL (ref 19.0–186.0)
Retic Ct Pct: 3.4 % — ABNORMAL HIGH (ref 0.4–3.1)

## 2022-12-24 LAB — HEPATIC FUNCTION PANEL
ALT: 9 U/L (ref 0–44)
AST: 27 U/L (ref 15–41)
Albumin: 2.8 g/dL — ABNORMAL LOW (ref 3.5–5.0)
Alkaline Phosphatase: 79 U/L (ref 38–126)
Bilirubin, Direct: <0.1 mg/dL (ref 0.0–0.2)
Total Bilirubin: 0.4 mg/dL (ref 0.3–1.2)
Total Protein: 7 g/dL (ref 6.5–8.1)

## 2022-12-24 LAB — T4, FREE: Free T4: 1.24 ng/dL — ABNORMAL HIGH (ref 0.61–1.12)

## 2022-12-24 LAB — CK: Total CK: 41 U/L (ref 38–234)

## 2022-12-24 LAB — SEDIMENTATION RATE: Sed Rate: 92 mm/h — ABNORMAL HIGH (ref 0–22)

## 2022-12-24 LAB — PHOSPHORUS: Phosphorus: 3.6 mg/dL (ref 2.5–4.6)

## 2022-12-24 LAB — CBG MONITORING, ED: Glucose-Capillary: 182 mg/dL — ABNORMAL HIGH (ref 70–99)

## 2022-12-24 LAB — TSH: TSH: 0.378 u[IU]/mL (ref 0.350–4.500)

## 2022-12-24 LAB — HEMOGLOBIN A1C
Hgb A1c MFr Bld: 7.8 % — ABNORMAL HIGH (ref 4.8–5.6)
Mean Plasma Glucose: 177.16 mg/dL

## 2022-12-24 LAB — TROPONIN I (HIGH SENSITIVITY)
Troponin I (High Sensitivity): 46 ng/L — ABNORMAL HIGH (ref <18)
Troponin I (High Sensitivity): 45 ng/L — ABNORMAL HIGH (ref <18)

## 2022-12-24 LAB — C-REACTIVE PROTEIN: CRP: 0.9 mg/dL (ref <1.0)

## 2022-12-24 LAB — MAGNESIUM: Magnesium: 1.8 mg/dL (ref 1.7–2.4)

## 2022-12-24 MED ORDER — ONDANSETRON HCL 4 MG/2ML IJ SOLN: 4.0000 mg | Freq: Four times a day (QID) | INTRAMUSCULAR | Status: DC | PRN

## 2022-12-24 MED ORDER — INSULIN GLARGINE-YFGN 100 UNIT/ML ~~LOC~~ SOLN
50.0000 [IU] | Freq: Every day | SUBCUTANEOUS | Status: DC
  Administered 2022-12-25: 50 [IU] via SUBCUTANEOUS
  Filled 2022-12-24 (×2): qty 0.5

## 2022-12-24 MED ORDER — SODIUM CHLORIDE 0.9% FLUSH: 3.0000 mL | INTRAVENOUS | Status: DC | PRN

## 2022-12-24 MED ORDER — METHIMAZOLE 2.5 MG HALF TABLET
2.5000 mg | ORAL_TABLET | Freq: Every day | ORAL | Status: DC
  Administered 2022-12-25 – 2022-12-26 (×2): 2.5 mg via ORAL
  Filled 2022-12-24 (×2): qty 1

## 2022-12-24 MED ORDER — ASPIRIN 81 MG PO TBEC
81.0000 mg | DELAYED_RELEASE_TABLET | Freq: Every day | ORAL | Status: DC
  Administered 2022-12-25 – 2022-12-26 (×2): 81 mg via ORAL
  Filled 2022-12-24 (×2): qty 1

## 2022-12-24 MED ORDER — SODIUM CHLORIDE 0.9 % IV SOLN: 250.0000 mL | INTRAVENOUS | Status: DC | PRN

## 2022-12-24 MED ORDER — GABAPENTIN 300 MG PO CAPS
300.0000 mg | ORAL_CAPSULE | Freq: Every evening | ORAL | Status: DC
  Administered 2022-12-25: 300 mg via ORAL
  Filled 2022-12-24: qty 1

## 2022-12-24 MED ORDER — SODIUM CHLORIDE 0.9% FLUSH
3.0000 mL | Freq: Two times a day (BID) | INTRAVENOUS | Status: DC
  Administered 2022-12-25 – 2022-12-26 (×3): 3 mL via INTRAVENOUS

## 2022-12-24 MED ORDER — ACETAMINOPHEN 325 MG PO TABS: 650.0000 mg | ORAL_TABLET | Freq: Four times a day (QID) | ORAL | Status: DC | PRN

## 2022-12-24 MED ORDER — DOCUSATE SODIUM 100 MG PO CAPS
100.0000 mg | ORAL_CAPSULE | Freq: Two times a day (BID) | ORAL | Status: DC
  Administered 2022-12-24 – 2022-12-25 (×2): 100 mg via ORAL
  Filled 2022-12-24 (×2): qty 1

## 2022-12-24 MED ORDER — INSULIN ASPART 100 UNIT/ML IJ SOLN: 0.0000 [IU] | Freq: Every day | INTRAMUSCULAR | Status: DC

## 2022-12-24 MED ORDER — ACETAMINOPHEN 650 MG RE SUPP: 650.0000 mg | Freq: Four times a day (QID) | RECTAL | Status: DC | PRN

## 2022-12-24 MED ORDER — INSULIN ASPART 100 UNIT/ML IJ SOLN
0.0000 [IU] | Freq: Three times a day (TID) | INTRAMUSCULAR | Status: DC
  Administered 2022-12-25: 3 [IU] via SUBCUTANEOUS

## 2022-12-24 MED ORDER — SENNA 8.6 MG PO TABS
1.0000 | ORAL_TABLET | Freq: Two times a day (BID) | ORAL | Status: DC
  Administered 2022-12-24 – 2022-12-25 (×2): 8.6 mg via ORAL
  Filled 2022-12-24 (×2): qty 1

## 2022-12-24 MED ORDER — ONDANSETRON HCL 4 MG PO TABS: 4.0000 mg | ORAL_TABLET | Freq: Four times a day (QID) | ORAL | Status: DC | PRN

## 2022-12-24 MED ORDER — ATORVASTATIN CALCIUM 80 MG PO TABS
80.0000 mg | ORAL_TABLET | Freq: Every day | ORAL | Status: DC
  Administered 2022-12-25 – 2022-12-26 (×2): 80 mg via ORAL
  Filled 2022-12-24 (×2): qty 1

## 2022-12-24 MED ORDER — POLYETHYLENE GLYCOL 3350 17 G PO PACK: 17.0000 g | PACK | Freq: Every day | ORAL | Status: DC | PRN

## 2022-12-24 MED ORDER — HYDROCODONE-ACETAMINOPHEN 5-325 MG PO TABS
1.0000 | ORAL_TABLET | ORAL | Status: DC | PRN
  Administered 2022-12-24 – 2022-12-25 (×2): 2 via ORAL
  Filled 2022-12-24 (×2): qty 2

## 2022-12-24 MED ORDER — METHOCARBAMOL 1000 MG/10ML IJ SOLN: 500.0000 mg | Freq: Four times a day (QID) | INTRAVENOUS | Status: DC | PRN

## 2022-12-24 MED ORDER — MORPHINE SULFATE (PF) 4 MG/ML IV SOLN
4.0000 mg | Freq: Once | INTRAVENOUS | Status: AC
  Administered 2022-12-24: 4 mg via INTRAVENOUS
  Filled 2022-12-24: qty 1

## 2022-12-24 MED ORDER — COLCHICINE 0.6 MG PO TABS
0.6000 mg | ORAL_TABLET | Freq: Every day | ORAL | Status: DC
  Administered 2022-12-24 – 2022-12-25 (×2): 0.6 mg via ORAL
  Filled 2022-12-24 (×2): qty 1

## 2022-12-24 MED ORDER — PAROXETINE HCL 20 MG PO TABS
40.0000 mg | ORAL_TABLET | Freq: Every day | ORAL | Status: DC
  Administered 2022-12-25 – 2022-12-26 (×2): 40 mg via ORAL
  Filled 2022-12-24 (×2): qty 2

## 2022-12-24 MED ORDER — FENTANYL CITRATE PF 50 MCG/ML IJ SOSY: 12.5000 ug | PREFILLED_SYRINGE | INTRAMUSCULAR | Status: DC | PRN

## 2022-12-24 MED ORDER — TRAZODONE HCL 50 MG PO TABS
50.0000 mg | ORAL_TABLET | Freq: Every day | ORAL | Status: DC
  Administered 2022-12-24 – 2022-12-25 (×2): 50 mg via ORAL
  Filled 2022-12-24 (×2): qty 1

## 2022-12-24 MED ORDER — SODIUM CHLORIDE 0.9 % IV BOLUS
500.0000 mL | Freq: Once | INTRAVENOUS | Status: AC
  Administered 2022-12-24: 500 mL via INTRAVENOUS

## 2022-12-24 NOTE — Assessment & Plan Note (Signed)
-  currently appears to be slightly on the dry side, hold home diuretics for tonight and restart when appears euvolemic, carefuly follow fluid status and Cr

## 2022-12-24 NOTE — ED Provider Notes (Signed)
Mildly elevated troponin, flat, labs otherwise at baseline [facility] Provider Note   CSN: [id number] Arrival date & time: 12/24/22  1033     History  Chief Complaint  Patient presents with   Chest Pain    Erica Estrada is a 72 y.o. female.  Patient is a 72 year old female with a past medical history of CAD status post CABG on 10/3, diabetes, hypertension senting to the emergency department with chest pain.  The patient states that she has been having right greater than left sided chest pain since her surgery.  She states that the pain is worse with any movement and is worsened over the weekend where she has been having trouble sleeping at night due to the pain.  She reports some associated shortness of breath.  She denies any fever or cough or lower extremity swelling.  She states that "her medications were discontinued" and so she has not been taking anything and "was not sent home with any pain medication".  Her daughter did have some tramadol at home that she has been taking as needed with some relief.  The history is provided by the patient and a relative.  Chest Pain      Home Medications Prior to Admission medications   Medication Sig Start Date End Date Taking? Authorizing Provider  ASPERCREME LIDOCAINE 4 % CREA Apply 1 application  topically 2 (two) times daily as needed (pain). Apply to back and knees prn for pain    [provider]  aspirin EC 325 MG tablet Take 1 tablet (325 mg total) by mouth daily. 12/20/22   Ardelle Balls, PA-C  atorvastatin (LIPITOR) 80 MG tablet Take 1 tablet (80 mg total) by mouth daily. 11/13/17   Denzil Magnuson, NP  calcium carbonate (TUMS - DOSED IN MG ELEMENTAL CALCIUM) 500 MG chewable tablet Chew 2 tablets by mouth 2 (two) times daily as needed for indigestion or heartburn.    [provider]  camphor-menthol Wynelle Fanny) lotion Apply 1 application topically as needed for  itching.    [provider]  Cholecalciferol 25 MCG (1000 UT) capsule Take 1,000 Units by mouth daily.    [provider]  clobetasol cream (TEMOVATE) 0.05 % Apply 1 application topically 2 (two) times a week. Apply to vulva    [provider]  clotrimazole (LOTRIMIN) 1 % cream Apply 1 application topically 2 (two) times daily.    [provider]  cyclobenzaprine (FLEXERIL) 10 MG tablet Take 10 mg by mouth 3 (three) times daily. 02/06/22   [provider]  ferrous sulfate 325 (65 FE) MG tablet Take 1 tablet (325 mg total) by mouth daily. 01/23/21   Darlin Drop, DO  folic acid (FOLVITE) 1 MG tablet Take 1 mg by mouth daily.    [provider]  furosemide (LASIX) 40 MG tablet Take 1 tablet (40 mg total) by mouth daily. For 4 days then stop. 12/20/22   Ardelle Balls, PA-C  gabapentin (NEURONTIN) 300 MG capsule Take 300 mg by mouth daily. 11/30/22   [provider]  HYDROcodone-acetaminophen (NORCO/VICODIN) 5-325 MG tablet Take 1 tablet by mouth every 6 (six) hours as needed. 12/20/22   Ardelle Balls, PA-C  insulin degludec (TRESIBA FLEXTOUCH) 200 UNIT/ML FlexTouch Pen Inject 65 Units into the skin at bedtime.    [provider]  JARDIANCE 25 MG TABS tablet Take 25 mg by mouth daily. 04/10/22   [provider]  Lancets (  ONETOUCH DELICA PLUS LANCET33G) MISC Apply topically. 08/19/21   [provider]  metFORMIN (GLUCOPHAGE) 500 MG tablet Take 1,000 mg by mouth 2 (two) times daily with a meal.    [provider]  methimazole (TAPAZOLE) 5 MG tablet Take 2.5 mg by mouth daily.    [provider]  metoprolol tartrate (LOPRESSOR) 25 MG tablet Take 1 tablet (25 mg total) by mouth 2 (two) times daily. 12/20/22   Doree Fudge M, PA-C  mometasone (NASONEX) 50 MCG/ACT nasal spray Place 2 sprays into the nose daily as needed.    [provider]  [facility] ULTRA test strip USE AS  DIRECTED FOR BLOOD SUGAR TESTING THREE TIMES DAILY FOR DIABETES E11.9 08/19/21   [provider]  PARoxetine (PAXIL) 40 MG tablet Take 1 tablet (40 mg total) by mouth every morning. 11/13/17   Denzil Magnuson, NP  potassium chloride SA (KLOR-CON M) 20 MEQ tablet Take 1 tablet (20 mEq total) by mouth daily. For 4 days then stop. 12/20/22   Doree Fudge M, PA-C  SPIRIVA HANDIHALER 18 MCG inhalation capsule Place 1 capsule into inhaler and inhale daily. 03/22/17   [provider]  traZODone (DESYREL) 50 MG tablet Take 50 mg by mouth at bedtime. 08/14/21   [provider]  triamcinolone ointment (KENALOG) 0.1 % SMARTSIG:1 Application Topical 2-3 Times Daily 07/20/22   [provider]  Vaginal Lubricant (REPLENS) GEL Place 1 application vaginally 3 (three) times daily as needed. Apply to vagina and vulva as needed for itching. Use on days that you do not use clobetasol    [provider]  VENTOLIN HFA 108 (90 Base) MCG/ACT inhaler Inhale 2 puffs into the lungs every 6 (six) hours as needed. 03/26/17   [provider]      Allergies    Ace inhibitors and Fluticasone    Review of Systems   Review of Systems  Cardiovascular:  Positive for chest pain.    Physical Exam Updated Vital Signs BP 111/61   Pulse 82   Temp 98.8 F (37.1 C) (Oral)   Resp 18   Ht 5\' 5"  (1.651 m)   Wt 74.4 kg   SpO2 100%   BMI 27.29 kg/m  Physical Exam Vitals and nursing note reviewed.  Constitutional:      General: She is not in acute distress.    Appearance: She is well-developed. She is obese.  HENT:     Head: Normocephalic.  Eyes:     Extraocular Movements: Extraocular movements intact.  Cardiovascular:     Rate and Rhythm: Normal rate and regular rhythm.     Pulses:          Radial pulses are 2+ on the right side and 2+ on the left side.     Heart sounds: Normal heart sounds.  Pulmonary:     Effort: Pulmonary effort is normal.     Breath sounds:  Normal breath sounds.  Chest:     Chest wall: Tenderness (mild diffuse chest wall tenderness to palpation) present.     Comments: Sternotomy scar appears well-healing with no surrounding erythema, warmth or drainage Abdominal:     Palpations: Abdomen is soft.     Tenderness: There is no abdominal tenderness.  Musculoskeletal:        General: Normal range of motion.     Cervical back: Normal range of motion and neck supple.     Right lower leg: No edema.     Left lower leg: No  edema.  Skin:    General: Skin is warm and dry.  Neurological:     General: No focal deficit present.     Mental Status: She is alert and oriented to person, place, and time.  Psychiatric:        Mood and Affect: Mood normal.        Behavior: Behavior normal.     ED Results / Procedures / Treatments   Labs (all labs ordered are listed, but only abnormal results are displayed) Labs Reviewed  BASIC METABOLIC PANEL - Abnormal; Notable for the following components:      Result Value   Glucose, Bld 161 (*)    BUN <5 (*)    All other components within normal limits  CBC - Abnormal; Notable for the following components:   WBC 11.1 (*)    RBC 3.34 (*)    Hemoglobin 9.1 (*)    HCT 30.2 (*)    Platelets 560 (*)    All other components within normal limits  TROPONIN I (HIGH SENSITIVITY) - Abnormal; Notable for the following components:   Troponin I (High Sensitivity) 46 (*)    All other components within normal limits  TROPONIN I (HIGH SENSITIVITY) - Abnormal; Notable for the following components:   Troponin I (High Sensitivity) 45 (*)    All other components within normal limits    EKG EKG Interpretation Date/Time:  Monday December 24 2022 10:56:21 EDT Ventricular Rate:  74 PR Interval:  142 QRS Duration:  88 QT Interval:  384 QTC Calculation: 426 R Axis:   88  Text Interpretation: Normal sinus rhythm ST & T wave abnormality, consider inferolateral ischemia Abnormal ECG  Inferolateral t-wave  inversions appear new compared to prior EKG Confirmed by Elayne Snare ([id number]) on 12/24/2022 11:55:31 AM  Radiology DG Chest 2 View  Result Date: 12/24/2022 CLINICAL DATA:  Chest pain EXAM: CHEST - 2 VIEW COMPARISON:  Chest radiograph dated December 18, 2022. FINDINGS: The heart size and mediastinal contours are unchanged. Aortic calcification. Similar appearance of bandlike atelectasis or consolidation at the left midlung. Bandlike atelectasis or consolidation at the right lung base is decreased compared to the prior exam. No pneumothorax. Trace left pleural effusion with basilar atelectasis. The visualized osseous structures are unchanged. Prior median sternotomy. IMPRESSION: 1. Persistent bandlike atelectasis or consolidation of the left midlung with improvement at the right lung base. Trace left pleural effusion with left basilar atelectasis. 2. Cardiomegaly. Electronically Signed   By: Hart Robinsons M.D.   On: 12/24/2022 13:49    Procedures Procedures    Medications Ordered in ED Medications  morphine (PF) 4 MG/ML injection 4 mg (4 mg Intravenous Given 12/24/22 1258)    ED Course/ Medical Decision Making/ A&P Clinical Course as of 12/24/22 1546  Mon Dec 24, 2022  1510 Spoke with Dr. Laneta Simmers with CT surgery who states work up appears reassuring for post-op. Recommended trending troponin and cardiology eval but no additional work up per CT surgery. [VK]  1532 I spoke with cardiology who will evaluate at bedside for recommendations. Patient signed out to Dr. Jeraldine Loots pending cardiology recommendations. [VK]    Clinical Course User Index [VK] Rexford Maus, DO                                 Medical Decision Making This patient presents to the ED with chief complaint(s) of chest pain with pertinent past medical history of  CAD s/p recent CABG, DM, HTN which further complicates the presenting complaint. The complaint involves an extensive differential diagnosis and also carries  with it a high risk of complications and morbidity.    The differential diagnosis includes ACS, arrhythmia, anemia, pneumonia, pneumothorax, postop pericarditis, myocarditis, pericardial effusion, chest wall pain, no signs of surgical site infection on exam  Additional history obtained: Additional history obtained from family Records reviewed previous admission documents  ED Course and Reassessment: On patient's arrival she is hemodynamically stable in no acute distress.  Did have EKG performed in triage that did show concern for new inferior lateral T wave inversions compared to prior EKG.  She has labs including troponin ordered as well as chest x-ray.  She was given morphine for pain control and will be closely reassessed.  Independent labs interpretation:  The following labs were independently interpreted: mildly elevated troponin flat, otherwise within normal range  Independent visualization of imaging: - I independently visualized the following imaging with scope of interpretation limited to determining acute life threatening conditions related to emergency care: CXR, which revealed no acute abnormality  Consultation: - Consulted or discussed management/test interpretation w/ external professional: CT surgery, cardiology     Amount and/or Complexity of Data Reviewed Labs: ordered. Radiology: ordered.  Risk Prescription drug management.          Final Clinical Impression(s) / ED Diagnoses Final diagnoses:  None    Rx / DC Orders ED Discharge Orders     None         Rexford Maus, DO 12/24/22 1546

## 2022-12-24 NOTE — Assessment & Plan Note (Signed)
Continue Lipitor 80 mg p.o. daily

## 2022-12-24 NOTE — Assessment & Plan Note (Signed)
Appreciate cardiology consult echogram in the morning.  Most likely musculoskeletal.  Troponin remained stable

## 2022-12-24 NOTE — Subjective & Objective (Signed)
Pt w hx f CAD sp CABG last week patient presents with left-sided chest pain radiating down to left arm and left side has been going on since her surgery on the third associated with some shortness of breath and nausea. Pain is worse with movement and has been getting worse over the weekend to a point where she can hardly sleep no associated fevers or chills no cough no leg swelling was not discharged home on any pain medication after her CABG so her daughter that her use some of the tramadol that they had at home for pain as needed

## 2022-12-24 NOTE — Assessment & Plan Note (Addendum)
Hold Lopressor 25 mg p.o.  given soft bp

## 2022-12-24 NOTE — Discharge Instructions (Signed)
Be sure to follow-up with your physician tomorrow return here for concerning changes in your condition.

## 2022-12-24 NOTE — Assessment & Plan Note (Signed)
Obtain anemia panel  Transfuse for Hg <7 , rapidly dropping or  if symptomatic

## 2022-12-24 NOTE — Assessment & Plan Note (Signed)
Chronic stable continue home medications including Paxil 40 mg a day

## 2022-12-24 NOTE — Consult Note (Addendum)
Cardiology Consultation   Patient ID: Erica Estrada MRN: [id number]; DOB: 1950-11-20  Admit date: 12/24/2022 Date of Consult: 12/24/2022  PCP:  [name], MD   [facility] Providers Cardiologist:  Debbe Odea, MD   Patient Profile:   Erica Estrada is a 72 y.o. female with a hx of CAD s/p recent CABG x 3 12/13/22, HTN, HLD, DM2, former smoker, PTSD, severe recurrent major depression, hepatic steatosis, multiple thyroid nodules, degenerative disc disease, GERD who is being seen 12/24/2022 for the evaluation of chest pain at the request of Dr. Theresia Lo.  History of Present Illness:   Erica Estrada underwent nuclear stress test 02/2021 that showed no significant ischemia.  She had a subsequent nuclear stress test 10/09/2022 that showed evidence of infarction and ischemia but study was felt to be suboptimal due to intense GI uptake at the border of the inferior wall.  Due to ongoing chest pain, she underwent heart catheterization 11/27/2022 that showed severe three-vessel disease with mildly elevated right heart and pulmonary artery pressures.  She was referred to CT surgery evaluation.  She ultimately underwent CABG x 3 on 12/13/2022 with LIMA-LAD, SVG-PDA, SVG-diagonal.  Postop course was uneventful, but it was noted she needed a lot of encouragement to ambulate. A1c 8%.   She presented back to [facility] today with complaints of chest pain in her left chest radiating down left arm and left side. She also reports shortness of breath and states her medications were discontinued and not sent home on any pain medications.   Discharge summary shows prescription for norco at discharge.   PTA medications include 325 mg ASA, 80 mg lipitor, 25 mg jardiance, 25 mg lopressor BID, and insulin  40 mg lasix x 4 days  On arrival, BP 112/64, HR 76, 93% on room air, improved to 100%.  WBC 11.1 (was 9.6 at discharge) Hb 9.1 (8.2) PLT 560  HS troponin 46 --> 45 EKG with J  point elevation, ST changes in inferior and anterolateral leads  Cardiology was consulted for chest pain and elevated troponin. During my interview, she reports being discharged 4 days ago and family at bedside reports she has been out of pain medication for 3-4 days. She describes shortness of breath at times, although details are unclear. She also reports right sided chest wall pain that is tender to palpation on my exam. Her description of chest pain does not sound typical for angina. CP constant, worse with arm movement and with "pulling," worse with deep inspiration, present when walking to the mailbox and resting. Nothing improves pain.    Past Medical History:  Diagnosis Date   Arthritis    Asthma    Coronary artery calcification    Noted on prior CTA chest (01/2021)   Depression    Diabetes mellitus without complication (HCC)    Former tobacco use    GERD (gastroesophageal reflux disease)    Graves' disease    Headache    Hepatitis 1995   Hep C.  Treated.   Hyperlipidemia    Hypertension    Multiple thyroid nodules    Precordial chest pain    MV 02/2021 low risk, no significant ischemia    Past Surgical History:  Procedure Laterality Date   ABDOMINAL HYSTERECTOMY     CORONARY ARTERY BYPASS GRAFT N/A 12/13/2022   Procedure: CORONARY ARTERY BYPASS GRAFTING (CABG) TIMES THREE UTILIZING LEFT INTERNAL MAMMARY ARTERY AND ENDOSCOPIC VEIN HARVEST RIGHT GREATER SAPHENOUS VEIN;  Surgeon: Alleen Borne, MD;  Location: [facility] OR;  Service: Open Heart Surgery;  Laterality: N/A;   EYE SURGERY Left    prosthetic eye put in   RIGHT/LEFT HEART CATH AND CORONARY ANGIOGRAPHY Bilateral 11/27/2022   Procedure: RIGHT/LEFT HEART CATH AND CORONARY ANGIOGRAPHY;  Surgeon: Yvonne Kendall, MD;  Location: [facility] INVASIVE CV LAB;  Service: Cardiovascular;  Laterality: Bilateral;   TEE WITHOUT CARDIOVERSION N/A 12/13/2022   Procedure: TRANSESOPHAGEAL ECHOCARDIOGRAM;  Surgeon: Alleen Borne, MD;  Location:  [facility] OR;  Service: Open Heart Surgery;  Laterality: N/A;   TONSILLECTOMY     removed as a child     Home Medications:  Prior to Admission medications   Medication Sig Start Date End Date Taking? Authorizing Provider  ASPERCREME LIDOCAINE 4 % CREA Apply 1 application  topically 2 (two) times daily as needed (pain). Apply to back and knees prn for pain    [provider]  aspirin EC 325 MG tablet Take 1 tablet (325 mg total) by mouth daily. 12/20/22   Ardelle Balls, PA-C  atorvastatin (LIPITOR) 80 MG tablet Take 1 tablet (80 mg total) by mouth daily. 11/13/17   Denzil Magnuson, NP  calcium carbonate (TUMS - DOSED IN MG ELEMENTAL CALCIUM) 500 MG chewable tablet Chew 2 tablets by mouth 2 (two) times daily as needed for indigestion or heartburn.    [provider]  camphor-menthol Wynelle Fanny) lotion Apply 1 application topically as needed for itching.    [provider]  Cholecalciferol 25 MCG (1000 UT) capsule Take 1,000 Units by mouth daily.    [provider]  clobetasol cream (TEMOVATE) 0.05 % Apply 1 application topically 2 (two) times a week. Apply to vulva    [provider]  clotrimazole (LOTRIMIN) 1 % cream Apply 1 application topically 2 (two) times daily.    [provider]  cyclobenzaprine (FLEXERIL) 10 MG tablet Take 10 mg by mouth 3 (three) times daily. 02/06/22   [provider]  ferrous sulfate 325 (65 FE) MG tablet Take 1 tablet (325 mg total) by mouth daily. 01/23/21   Darlin Drop, DO  folic acid (FOLVITE) 1 MG tablet Take 1 mg by mouth daily.    [provider]  furosemide (LASIX) 40 MG tablet Take 1 tablet (40 mg total) by mouth daily. For 4 days then stop. 12/20/22   Ardelle Balls, PA-C  gabapentin (NEURONTIN) 300 MG capsule Take 300 mg by mouth daily. 11/30/22   [provider]  HYDROcodone-acetaminophen (NORCO/VICODIN) 5-325 MG tablet Take 1 tablet by mouth every 6 (six) hours as needed.  12/20/22   Ardelle Balls, PA-C  insulin degludec (TRESIBA FLEXTOUCH) 200 UNIT/ML FlexTouch Pen Inject 65 Units into the skin at bedtime.    [provider]  JARDIANCE 25 MG TABS tablet Take 25 mg by mouth daily. 04/10/22   [provider]  Lancets (ONETOUCH DELICA PLUS Lena) MISC Apply topically. 08/19/21   [provider]  metFORMIN (GLUCOPHAGE) 500 MG tablet Take 1,000 mg by mouth 2 (two) times daily with a meal.    [provider]  methimazole (TAPAZOLE) 5 MG tablet Take 2.5 mg by mouth daily.    [provider]  metoprolol tartrate (LOPRESSOR) 25 MG tablet Take 1 tablet (25 mg total) by mouth 2 (two) times daily. 12/20/22   Doree Fudge M, PA-C  mometasone (NASONEX) 50 MCG/ACT nasal spray Place 2 sprays into the nose daily as needed.    [provider]  [facility] ULTRA test strip USE  AS DIRECTED FOR BLOOD SUGAR TESTING THREE TIMES DAILY FOR DIABETES E11.9 08/19/21   [provider]  PARoxetine (PAXIL) 40 MG tablet Take 1 tablet (40 mg total) by mouth every morning. 11/13/17   Denzil Magnuson, NP  potassium chloride SA (KLOR-CON M) 20 MEQ tablet Take 1 tablet (20 mEq total) by mouth daily. For 4 days then stop. 12/20/22   Doree Fudge M, PA-C  SPIRIVA HANDIHALER 18 MCG inhalation capsule Place 1 capsule into inhaler and inhale daily. 03/22/17   [provider]  traZODone (DESYREL) 50 MG tablet Take 50 mg by mouth at bedtime. 08/14/21   [provider]  triamcinolone ointment (KENALOG) 0.1 % SMARTSIG:1 Application Topical 2-3 Times Daily 07/20/22   [provider]  Vaginal Lubricant (REPLENS) GEL Place 1 application vaginally 3 (three) times daily as needed. Apply to vagina and vulva as needed for itching. Use on days that you do not use clobetasol    [provider]  VENTOLIN HFA 108 (90 Base) MCG/ACT inhaler Inhale 2 puffs into the lungs every 6 (six) hours as needed. 03/26/17    [provider]    Inpatient Medications: Scheduled Meds:  Continuous Infusions:  PRN Meds:   Allergies:    Allergies  Allergen Reactions   Ace Inhibitors Cough    Chronic cough for 3 months. Trial off lisinopril starting 05/11/2011 effective in stopping cough.   Fluticasone Other (See Comments)    choking    Social History:   Social History   Socioeconomic History   Marital status: Married    Spouse name: Not on file   Number of children: 3   Years of education: Not on file   Highest education level: Not on file  Occupational History   Not on file  Tobacco Use   Smoking status: Former    Current packs/day: 0.00    Average packs/day: 0.5 packs/day for 33.0 years (16.5 ttl pk-yrs)    Types: Cigarettes    Start date: 08/11/1986    Quit date: 08/11/2019    Years since quitting: 3.3   Smokeless tobacco: Never  Vaping Use   Vaping status: Never Used  Substance and Sexual Activity   Alcohol use: No   Drug use: No   Sexual activity: Yes  Other Topics Concern   Not on file  Social History Narrative   Lives with Nedra Hai, husband.    Social Determinants of Health   Financial Resource Strain: Not on file  Food Insecurity: No Food Insecurity (12/17/2022)   Hunger Vital Sign    Worried About Running Out of Food in the Last Year: Never true    Ran Out of Food in the Last Year: Never true  Transportation Needs: Unmet Transportation Needs (12/17/2022)   PRAPARE - Administrator, Civil Service (Medical): Yes    Lack of Transportation (Non-Medical): No  Physical Activity: Not on file  Stress: Not on file  Social Connections: Not on file  Intimate Partner Violence: Not At Risk (12/17/2022)   Humiliation, Afraid, Rape, and Kick questionnaire    Fear of Current or Ex-Partner: No    Emotionally Abused: No    Physically Abused: No    Sexually Abused: No    Family History:    Family History  Problem Relation Age of Onset   Breast cancer Neg Hx      ROS:   Please see the history of present illness.   All other ROS reviewed and negative.  Physical Exam/Data:   Vitals:   12/24/22 1122 12/24/22 1131 12/24/22 1245 12/24/22 1445  BP:  113/66 118/69 111/61  Pulse:  74 78 82  Resp:  15 19 18   Temp:      TempSrc:      SpO2:  100% 100% 100%  Weight: 74.4 kg     Height: 5\' 5"  (1.651 m)      No intake or output data in the 24 hours ending 12/24/22 1609    12/24/2022   11:22 AM 12/20/2022    4:29 AM 12/19/2022    4:06 AM  Last 3 Weights  Weight (lbs) 164 lb 0.4 oz 164 lb 0.4 oz 166 lb 14.2 oz  Weight (kg) 74.4 kg 74.4 kg 75.7 kg     Body mass index is 27.29 kg/m.  General:  Well nourished, well developed, in no acute distress HEENT: normal Neck: no JVD Vascular: No carotid bruits; Distal pulses 2+ bilaterally Cardiac:  RRR, sternotomy healing well, tender to palpation on right chest wall Lungs:  clear to auscultation bilaterally, no wheezing, rhonchi or rales  Abd: soft, nontender, no hepatomegaly  Ext: no edema Musculoskeletal:  No deformities, BUE and BLE strength normal and equal Skin: warm and dry  Neuro:  CNs 2-12 intact, no focal abnormalities noted Psych:  Normal affect   EKG:  The EKG was personally reviewed and demonstrates:  sinus rhythm with HR 74 J point elevation and TWI inferior leads, ST changes anterolateral leads Telemetry:  Telemetry was personally reviewed and demonstrates:  sinus rhythm in the 80s  Relevant CV Studies:  Echo 11/27/22:  1. Left ventricular ejection fraction, by estimation, is 55 to 60%. The  left ventricle has normal function. The left ventricle has no regional  wall motion abnormalities. Left ventricular diastolic parameters are  consistent with Grade I diastolic  dysfunction (impaired relaxation).   2. Right ventricular systolic function is normal. The right ventricular  size is normal. Tricuspid regurgitation signal is inadequate for assessing  PA pressure.   3. The mitral valve is  degenerative. Mild mitral valve regurgitation. No  evidence of mitral stenosis.   4. The aortic valve is tricuspid. There is mild thickening of the aortic  valve. Aortic valve regurgitation is not visualized. Aortic valve  sclerosis is present, with no evidence of aortic valve stenosis.    R/L [facility] 11/27/22: Conclusions: Severe three-vessel coronary artery disease, as detailed below. Normal left heart filling pressures. Mildly elevated right heart and pulmonary artery pressures. Normal Fick cardiac output/index.   Recommendations: Refer to cardiac surgery for CABG evaluation in the setting of three-vessel coronary artery disease and diabetes mellitus. Obtain echocardiogram prior to discharge today. Increase isosorbide mononitrate to 30 mg daily for additional antianginal therapy. Aggressive secondary prevention of coronary artery disease.  Laboratory Data:  High Sensitivity Troponin:   Recent Labs  Lab 12/24/22 1117 12/24/22 1342  TROPONINIHS 46* 45*     Chemistry Recent Labs  Lab 12/24/22 1117  NA 141  K 3.9  CL 101  CO2 27  GLUCOSE 161*  BUN <5*  CREATININE 0.76  CALCIUM 9.0  GFRNONAA >60  ANIONGAP 13    No results for input(s): "PROT", "ALBUMIN", "AST", "ALT", "ALKPHOS", "BILITOT" in the last 168 hours. Lipids No results for input(s): "CHOL", "TRIG", "HDL", "LABVLDL", "LDLCALC", "CHOLHDL" in the last 168 hours.  Hematology Recent Labs  Lab 12/24/22 1117  WBC 11.1*  RBC 3.34*  HGB 9.1*  HCT 30.2*  MCV 90.4  MCH 27.2  MCHC 30.1  RDW 13.8  PLT 560*   Thyroid No results for input(s): "TSH", "FREET4" in the last 168 hours.  BNPNo results for input(s): "BNP", "PROBNP" in the last 168 hours.  DDimer No results for input(s): "DDIMER" in the last 168 hours.   Radiology/Studies:  DG Chest 2 View  Result Date: 12/24/2022 CLINICAL DATA:  Chest pain EXAM: CHEST - 2 VIEW COMPARISON:  Chest radiograph dated December 18, 2022. FINDINGS: The heart size and  mediastinal contours are unchanged. Aortic calcification. Similar appearance of bandlike atelectasis or consolidation at the left midlung. Bandlike atelectasis or consolidation at the right lung base is decreased compared to the prior exam. No pneumothorax. Trace left pleural effusion with basilar atelectasis. The visualized osseous structures are unchanged. Prior median sternotomy. IMPRESSION: 1. Persistent bandlike atelectasis or consolidation of the left midlung with improvement at the right lung base. Trace left pleural effusion with left basilar atelectasis. 2. Cardiomegaly. Electronically Signed   By: Hart Robinsons M.D.   On: 12/24/2022 13:49     Assessment and Plan:   Chest pain CAD s/p recent CABG x 3 with LIMA-LAD, SVG-diagonal, SVG-PDA on 12/13/22 - discharged 12/20/22 - hs troponin 46 --> 45 - EKG with J point elevation inferior leads with minimal ST changes in anterior leads and TWI inferolateral leads - she reports she is out of pain medication - on 325 mg ASA,  lopressor - CP sounds atypical for angina, sounds more consistent with MSK pain and possible post-surgical pericarditis - pt requests more pain medication - can trial colchicine and tramadol - she was told to hold her gabapentin until seen in follow up with PCP, appt is tomorrow - given EKG changes, will obtain echo and monitor overnight - CRP, sed rate and start colchicine   Prior narcotic use - PMP search showed norco prescriptions this year that then transitioned to gabapentin   Hypertension - normotensive on 25 mg lopressor BID   Hyperlipidemia with LDL goal < 70 12/14/2022: Cholesterol 94; HDL 35; LDL Cholesterol 47; Triglycerides 58; VLDL 12 LP (a) not collected - 90 mg lipitor   Leukocytosis - CXR does not appear consistent with PNA - no urinary symptoms - advised to increase use of IS at home   Prior tobacco use - cessation encouraged   Recommend hospital admission.    Risk Assessment/Risk  Scores:   For questions or updates, please contact [facility] Please consult [email or web address] for contact info under    Signed, Marcelino Duster, [place]  12/24/2022 4:09 PM  Patient seen and examined. Agree with assessment and plan.  Erica Estrada is a 72 year old African-American female who had developed recent ischemic chest pain which led to cardiac catheterization on November 27, 2022 done by Dr. Okey Dupre.  She was found to have severe three-vessel CAD with mild elevation of right heart and pulmonary artery pressures.  Of note, ECG at that time was essentially normal.  She was seen by Dr. Laneta Simmers and on December 13, 2022 underwent CBG surgery with a LIMA to the LAD, SVG to PDA, and SVG to diagonal vessel.  Her postop ECG the following day remained entirely normal.  She presented to the emergency room today complaining of chest pressure.  Often she notes this more when she takes a deep breath or when she is moving her upper body.  She describes it as a tightness.  Her ECG today now shows new T wave abnormalities in the inferior lateral and lateral leads which were  not there postoperatively.  Chest pain does have some features concerning for possible post CABG pericarditis type features.  Troponin is minimally elevated at 46 and 45 not consistent with ACS.  With her ECG changes, I have recommended the patient be admitted by the hospital service overnight for further observation.  We will repeat ECG in a.m.  We will also empirically initiate colchicine, check ESR and CRP as well as follow-up laboratory and troponins.  Consider nonsteroidal anti-inflammatory therapy.  Creatinine is normal at 0.76.  Hb and hematocrit remain low but are significantly improved following her post CABG nadir when her hemoglobin dropped to 8.2 and hematocrit 26.6 and have improved to 9.1/30.2 today.  Follow-up.   Lennette Bihari, MD, [facility] 12/24/2022 6:17 PM

## 2022-12-24 NOTE — ED Provider Notes (Addendum)
6:01 PM Seen and evaluated by cardiology, appropriate for next day PCP follow-up.  Then recommendation for overnight monitoring was made, and the patient will be admitted to internal medicine team with cardiology following as a consulting service.   Gerhard Munch, MD 12/24/22 1801    Gerhard Munch, MD 12/24/22 [phone]

## 2022-12-24 NOTE — ED Triage Notes (Signed)
Pt to ED POV from home. Pt c/o left sided chest pressure radiating down to left arm / left side. Pt states she was admitted to hospital on oct 3rd for open heart surgery and c/o pain / pressure since. Pt endorses SOB and nausea.

## 2022-12-24 NOTE — H&P (Signed)
Erica Estrada YQM:[id number] DOB: Apr 04, 1950 DOA: 12/24/2022     PCP: [name], MD   Outpatient Specialists:  CARDS:   Dr. Debbe Odea, MD   Patient arrived to ER on 12/24/22 at 1033 Referred by Attending Rexford Maus, DO   Patient coming from:    home Lives  With family      Chief Complaint:   Chief Complaint  Patient presents with   Chest Pain    HPI: Larrisa Cravey is a 72 y.o. female with medical history significant of CAD sp recent CABG done on 11 December 2022, hypertension, HLD, DM2, PTSD depression hepatic steatosis GERD    Presented with   chest pain Pt w hx f CAD sp CABG last week patient presents with left-sided chest pain radiating down to left arm and left side has been going on since her surgery on the third associated with some shortness of breath and nausea. Pain is worse with movement and has been getting worse over the weekend to a point where she can hardly sleep no associated fevers or chills no cough no leg swelling was not discharged home on any pain medication after her CABG so her daughter that her use some of the tramadol that they had at home for pain as needed    Pt states she did not know what her medicine were for, did not go to the pharmacy to get them, until the pharmacy called Reports she had an itchy rash on her bottom No fever no chills No cough  Pain is over right chest now and is reproducible by palpation  Denies significant ETOH intake   Does not smoke   Lab Results  Component Value Date   SARSCOV2NAA NEGATIVE 12/13/2022   SARSCOV2NAA NEGATIVE 04/20/2021   SARSCOV2NAA NEGATIVE 04/04/2021   SARSCOV2NAA NEGATIVE 01/21/2021    Regarding pertinent Chronic problems:    Hyperlipidemia - on statins Lipitor (atorvastatin)  Lipid Panel     Component Value Date/Time   CHOL 94 12/14/2022 0438   TRIG 58 12/14/2022 0438   HDL 35 (L) 12/14/2022 0438   CHOLHDL 2.7 12/14/2022 0438   VLDL 12 12/14/2022  0438   LDLCALC 47 12/14/2022 0438     HTN on Lopressor   chronic CHF diastolic/systolic/ combined -on Lasix  last echo  Recent Results (from the past 52841 hour(s))  ECHOCARDIOGRAM COMPLETE   Collection Time: 11/27/22 12:57 PM  Result Value   Weight 2,592   Height 65   BP 151/80   Ao pk vel 1.21   AV Area VTI 1.87   AR max vel 2.02   AV Mean grad 3.3   AV Peak grad 5.8   S' Lateral 2.60   AV Area mean vel 1.88   Area-P 1/2 3.63   MV VTI 1.74   Est EF 55 - 60%   Narrative      ECHOCARDIOGRAM REPORT        1. Left ventricular ejection fraction, by estimation, is 55 to 60%. The left ventricle has normal function. The left ventricle has no regional wall motion abnormalities. Left ventricular diastolic parameters are consistent with Grade I diastolic  dysfunction (impaired relaxation).  2. Right ventricular systolic function is normal. The right ventricular size is normal. Tricuspid regurgitation signal is inadequate for assessing PA pressure.  3. The mitral valve is degenerative. Mild mitral valve regurgitation. No evidence of mitral stenosis.  4. The aortic valve is tricuspid. There is mild thickening of the  aortic valve. Aortic valve regurgitation is not visualized. Aortic valve sclerosis is present, with no evidence of aortic valve stenosis.            CAD  - On Aspirin, 325, statin, Lipitor 80 betablocker, Lopressor 25 mg twice daily Plavix                 -  followed by cardiology               Had a stress test in 2024 that showed evidence of infarction and ischemia ongoing chest pain underwent cardiac catheterization on 27 November 2022 showed severe three-vessel disease CT surgery was made aware and she underwent CABG x 3 on 13 December 2022   with LIMA-LAD, SVG-PDA, SVG-diagonal.     DM 2 -  Lab Results  Component Value Date   HGBA1C 8.2 (H) 12/11/2022  Jardiance and Tresiba  Thyroid disease Graves' disease multiple thyroid nodules Lab Results  Component Value  Date   TSH 0.064 (L) 01/22/2021        Chronic anemia - baseline hg Hemoglobin & Hematocrit  Recent Labs    12/16/22 0413 12/17/22 0834 12/24/22 1117  HGB 8.3* 8.2* 9.1*   Iron/TIBC/Ferritin/ %Sat    Component Value Date/Time   IRON 14 (L) 01/22/2021 0125   TIBC 395 01/22/2021 0125   FERRITIN 4 (L) 01/22/2021 0125   IRONPCTSAT 4 (L) 01/22/2021 0125     While in ER: Clinical Course as of 12/24/22 1910  Mon Dec 24, 2022  1510 Spoke with Dr. Laneta Simmers with CT surgery who states work up appears reassuring for post-op. Recommended trending troponin and cardiology eval but no additional work up per CT surgery. [VK]  1532 I spoke with cardiology who will evaluate at bedside for recommendations. Patient signed out to Dr. Jeraldine Loots pending cardiology recommendations. [VK]    Clinical Course User Index [VK] Rexford Maus, DO     Lab Orders         Basic metabolic panel         CBC         C-reactive protein         Sedimentation rate      CXR - Persistent bandlike atelectasis or consolidation of the left midlung with improvement at the right lung base. Trace left pleural effusion with left basilar atelectasis. 2. Cardiomegaly.     Following Medications were ordered in ER: Medications  colchicine tablet 0.6 mg (0.6 mg Oral Given 12/24/22 1855)  morphine (PF) 4 MG/ML injection 4 mg (4 mg Intravenous Given 12/24/22 1258)    _______________________________________________________ ER Provider Called:    CT surgery Dr. Laneta Simmers  They Recommend cycle cardiac enzymes cardiology consult Doubt surgical etiology  Cardiology consulted patient was seen in consult by Dr. Tresa Endo recommended obtaining repeat echogram increase his isosorbide to 30 mg daily Chest pain felt to be likely musculoskeletal troponins have been stable If no improvement can try colchicine and tramadol Order CRP sed rate and start colchicine     ED Triage Vitals  Encounter Vitals Group     BP 12/24/22 1050  112/64     Systolic BP Percentile --      Diastolic BP Percentile --      Pulse Rate 12/24/22 1050 76     Resp 12/24/22 1050 17     Temp 12/24/22 1050 98.2 F (36.8 C)     Temp Source 12/24/22 1101 Oral     SpO2 12/24/22 1050 93 %  Weight 12/24/22 1122 164 lb 0.4 oz (74.4 kg)     Height 12/24/22 1122 5\' 5"  (1.651 m)     Head Circumference --      Peak Flow --      Pain Score 12/24/22 1105 9     Pain Loc --      Pain Education --      Exclude from Growth Chart --   YQMV(78)@     _________________________________________ Significant initial  Findings: Abnormal Labs Reviewed  BASIC METABOLIC PANEL - Abnormal; Notable for the following components:      Result Value   Glucose, Bld 161 (*)    BUN <5 (*)    All other components within normal limits  CBC - Abnormal; Notable for the following components:   WBC 11.1 (*)    RBC 3.34 (*)    Hemoglobin 9.1 (*)    HCT 30.2 (*)    Platelets 560 (*)    All other components within normal limits  TROPONIN I (HIGH SENSITIVITY) - Abnormal; Notable for the following components:   Troponin I (High Sensitivity) 46 (*)    All other components within normal limits  TROPONIN I (HIGH SENSITIVITY) - Abnormal; Notable for the following components:   Troponin I (High Sensitivity) 45 (*)    All other components within normal limits      _________________________ Troponin  ordered Cardiac Panel (last 3 results) Recent Labs    12/24/22 1117 12/24/22 1342  TROPONINIHS 46* 45*     ECG: Ordered Personally reviewed and interpreted by me showing: HR : 74 Rhythm: J point elevation inferior leads with minimal ST changes in anterior leads  QTC 423   The recent clinical data is shown below. Vitals:   12/24/22 1131 12/24/22 1245 12/24/22 1445 12/24/22 1626  BP: 113/66 118/69 111/61 111/64  Pulse: 74 78 82 73  Resp: 15 19 18 19   Temp:    98.5 F (36.9 C)  TempSrc:    Oral  SpO2: 100% 100% 100% 100%  Weight:      Height:        WBC      Component Value Date/Time   WBC 11.1 (H) 12/24/2022 1117   LYMPHSABS 3.1 11/17/2020 2228   LYMPHSABS 2.6 10/17/2020 1100   MONOABS 0.7 11/17/2020 2228   EOSABS 0.3 11/17/2020 2228   EOSABS 0.3 10/17/2020 1100   BASOSABS 0.1 11/17/2020 2228   BASOSABS 0.1 10/17/2020 1100         UA   ordered   Urine analysis:    Component Value Date/Time   COLORURINE YELLOW 12/11/2022 1512   APPEARANCEUR CLEAR 12/11/2022 1512   LABSPEC 1.032 (H) 12/11/2022 1512   PHURINE 5.0 12/11/2022 1512   GLUCOSEU >=500 (A) 12/11/2022 1512   HGBUR NEGATIVE 12/11/2022 1512   BILIRUBINUR NEGATIVE 12/11/2022 1512   KETONESUR NEGATIVE 12/11/2022 1512   PROTEINUR 30 (A) 12/11/2022 1512   NITRITE NEGATIVE 12/11/2022 1512   LEUKOCYTESUR NEGATIVE 12/11/2022 1512    Results for orders placed or performed during the hospital encounter of 12/13/22  SARS Coronavirus 2 by RT PCR (hospital order, performed in [facility] hospital lab) *cepheid single result test* Anterior Nasal Swab     Status: None   Collection Time: 12/13/22  5:45 AM   Specimen: Anterior Nasal Swab  Result Value Ref Range Status   SARS Coronavirus 2 by RT PCR NEGATIVE NEGATIVE Final    Comment: Performed at [facility], [address] [place],  [place] [address]       __________________________________________________________ Recent Labs  Lab 12/24/22 1117  NA 141  K 3.9  CO2 27  GLUCOSE 161*  BUN <5*  CREATININE 0.76  CALCIUM 9.0    Cr  stable,    Lab Results  Component Value Date   CREATININE 0.76 12/24/2022   CREATININE 0.79 12/17/2022   CREATININE 0.73 12/16/2022     Plt: Lab Results  Component Value Date   PLT 560 (H) 12/24/2022    Recent Labs  Lab 12/24/22 1117  WBC 11.1*  HGB 9.1*  HCT 30.2*  MCV 90.4  PLT 560*    HG/HCT  stable,     Component Value Date/Time   HGB 9.1 (L) 12/24/2022 1117   HGB 12.2 11/20/2022 1212   HCT 30.2 (L) 12/24/2022 1117   HCT 37.6 11/20/2022 1212   MCV 90.4 12/24/2022  1117   MCV 87 11/20/2022 1212     _______________________________________________ Hospitalist was called for admission for   Post-op pain     The following Work up has been ordered so far:  Orders Placed This Encounter  Procedures   DG Chest 2 View   Basic metabolic panel   CBC   C-reactive protein   Sedimentation rate   Document Height and Actual Weight   Consult to cardiothoracic Surgery   Inpatient consult to Cardiology   Consult for [facility] Admission   EKG 12-Lead   ED EKG   ECHOCARDIOGRAM COMPLETE     OTHER Significant initial  Findings:  labs showing:     DM  labs:  HbA1C: Recent Labs    12/11/22 1512  HGBA1C 8.2*       CBG (last 3)  No results for input(s): "GLUCAP" in the last 72 hours.        Cultures: No results found for: "SDES", "SPECREQUEST", "CULT", "REPTSTATUS"   Radiological Exams on Admission: DG Chest 2 View  Result Date: 12/24/2022 CLINICAL DATA:  Chest pain EXAM: CHEST - 2 VIEW COMPARISON:  Chest radiograph dated December 18, 2022. FINDINGS: The heart size and mediastinal contours are unchanged. Aortic calcification. Similar appearance of bandlike atelectasis or consolidation at the left midlung. Bandlike atelectasis or consolidation at the right lung base is decreased compared to the prior exam. No pneumothorax. Trace left pleural effusion with basilar atelectasis. The visualized osseous structures are unchanged. Prior median sternotomy. IMPRESSION: 1. Persistent bandlike atelectasis or consolidation of the left midlung with improvement at the right lung base. Trace left pleural effusion with left basilar atelectasis. 2. Cardiomegaly. Electronically Signed   By: Hart Robinsons M.D.   On: 12/24/2022 13:49   _______________________________________________________________________________________________________ Latest  Blood pressure 111/64, pulse 73, temperature 98.5 F (36.9 C), temperature source Oral, resp. rate 19, height 5\' 5"   (1.651 m), weight 74.4 kg, SpO2 100%.   Vitals  labs and radiology finding personally reviewed  Review of Systems:    Pertinent positives include:  , fatigue,  Constitutional:  No weight loss, night sweats, Fevers, chills weight loss  HEENT:  No headaches, Difficulty swallowing,Tooth/dental problems,Sore throat,  No sneezing, itching, ear ache, nasal congestion, post nasal drip,  Cardio-vascular:  No chest pain, Orthopnea, PND, anasarca, dizziness, palpitations.no Bilateral lower extremity swelling  GI:  No heartburn, indigestion, abdominal pain, nausea, vomiting, diarrhea, change in bowel habits, loss of appetite, melena, blood in stool, hematemesis Resp:  no shortness of breath at rest. No dyspnea on exertion, No excess mucus, no productive cough, No non-productive cough, No coughing up  of blood.No change in color of mucus.No wheezing. Skin:  no rash or lesions. No jaundice GU:  no dysuria, change in color of urine, no urgency or frequency. No straining to urinate.  No flank pain.  Musculoskeletal:  No joint pain or no joint swelling. No decreased range of motion. No back pain.  Psych:  No change in mood or affect. No depression or anxiety. No memory loss.  Neuro: no localizing neurological complaints, no tingling, no weakness, no double vision, no gait abnormality, no slurred speech, no confusion  All systems reviewed and apart from HOPI all are negative _______________________________________________________________________________________________ Past Medical History:   Past Medical History:  Diagnosis Date   Arthritis    Asthma    Coronary artery calcification    Noted on prior CTA chest (01/2021)   Depression    Diabetes mellitus without complication (HCC)    Former tobacco use    GERD (gastroesophageal reflux disease)    Graves' disease    Headache    Hepatitis 1995   Hep C.  Treated.   Hyperlipidemia    Hypertension    Multiple thyroid nodules    Precordial  chest pain    MV 02/2021 low risk, no significant ischemia     Past Surgical History:  Procedure Laterality Date   ABDOMINAL HYSTERECTOMY     CORONARY ARTERY BYPASS GRAFT N/A 12/13/2022   Procedure: CORONARY ARTERY BYPASS GRAFTING (CABG) TIMES THREE UTILIZING LEFT INTERNAL MAMMARY ARTERY AND ENDOSCOPIC VEIN HARVEST RIGHT GREATER SAPHENOUS VEIN;  Surgeon: Alleen Borne, MD;  Location: [facility] OR;  Service: Open Heart Surgery;  Laterality: N/A;   EYE SURGERY Left    prosthetic eye put in   RIGHT/LEFT HEART CATH AND CORONARY ANGIOGRAPHY Bilateral 11/27/2022   Procedure: RIGHT/LEFT HEART CATH AND CORONARY ANGIOGRAPHY;  Surgeon: Yvonne Kendall, MD;  Location: [facility] INVASIVE CV LAB;  Service: Cardiovascular;  Laterality: Bilateral;   TEE WITHOUT CARDIOVERSION N/A 12/13/2022   Procedure: TRANSESOPHAGEAL ECHOCARDIOGRAM;  Surgeon: Alleen Borne, MD;  Location: [facility] OR;  Service: Open Heart Surgery;  Laterality: N/A;   TONSILLECTOMY     removed as a child    Social History:  Ambulatory walker      reports that she quit smoking about 3 years ago. Her smoking use included cigarettes. She started smoking about 36 years ago. She has a 16.5 pack-year smoking history. She has never used smokeless tobacco. She reports that she does not drink alcohol and does not use drugs.     Family History:   Family History  Problem Relation Age of Onset   Breast cancer Neg Hx    ______________________________________________________________________________________________ Allergies: Allergies  Allergen Reactions   Ace Inhibitors Cough    Chronic cough for 3 months. Trial off lisinopril starting 05/11/2011 effective in stopping cough.   Fluticasone Other (See Comments)    choking     Prior to Admission medications   Medication Sig Start Date End Date Taking? Authorizing Provider  gabapentin (NEURONTIN) 300 MG capsule Take 300 mg by mouth daily. 11/30/22  Yes [provider]  JARDIANCE 25 MG TABS  tablet Take 25 mg by mouth daily. 04/10/22  Yes [provider]  methimazole (TAPAZOLE) 5 MG tablet Take 2.5 mg by mouth daily.   Yes [provider]  traZODone (DESYREL) 50 MG tablet Take 50 mg by mouth at bedtime. 08/14/21  Yes [provider]  VENTOLIN HFA 108 (90 Base) MCG/ACT inhaler Inhale 2 puffs into the lungs every 6 (six) hours  as needed. 03/26/17  Yes [provider]  ASPERCREME LIDOCAINE 4 % CREA Apply 1 application  topically 2 (two) times daily as needed (pain). Apply to back and knees prn for pain    [provider]  atorvastatin (LIPITOR) 80 MG tablet Take 1 tablet (80 mg total) by mouth daily. 11/13/17   Denzil Magnuson, NP  calcium carbonate (TUMS - DOSED IN MG ELEMENTAL CALCIUM) 500 MG chewable tablet Chew 2 tablets by mouth 2 (two) times daily as needed for indigestion or heartburn.    [provider]  camphor-menthol Wynelle Fanny) lotion Apply 1 application topically as needed for itching.    [provider]  Cholecalciferol 25 MCG (1000 UT) capsule Take 1,000 Units by mouth daily.    [provider]  clobetasol cream (TEMOVATE) 0.05 % Apply 1 application topically 2 (two) times a week. Apply to vulva    [provider]  clotrimazole (LOTRIMIN) 1 % cream Apply 1 application topically 2 (two) times daily.    [provider]  cyclobenzaprine (FLEXERIL) 10 MG tablet Take 10 mg by mouth 3 (three) times daily. 02/06/22   [provider]  ferrous sulfate 325 (65 FE) MG tablet Take 1 tablet (325 mg total) by mouth daily. 01/23/21   Darlin Drop, DO  folic acid (FOLVITE) 1 MG tablet Take 1 mg by mouth daily.    [provider]  furosemide (LASIX) 40 MG tablet Take 1 tablet (40 mg total) by mouth daily. For 4 days then stop. 12/20/22   Ardelle Balls, PA-C  HYDROcodone-acetaminophen (NORCO/VICODIN) 5-325 MG tablet Take 1 tablet by mouth every 6 (six) hours as needed. 12/20/22    Ardelle Balls, PA-C  insulin degludec (TRESIBA FLEXTOUCH) 200 UNIT/ML FlexTouch Pen Inject 65 Units into the skin at bedtime.    [provider]  Lancets [facility] DELICA PLUS [place]) MISC Apply topically. 08/19/21   [provider]  metFORMIN (GLUCOPHAGE) 500 MG tablet Take 1,000 mg by mouth 2 (two) times daily with a meal.    [provider]  metoprolol tartrate (LOPRESSOR) 25 MG tablet Take 1 tablet (25 mg total) by mouth 2 (two) times daily. 12/20/22   Doree Fudge M, PA-C  mometasone (NASONEX) 50 MCG/ACT nasal spray Place 2 sprays into the nose daily as needed.    [provider]  [facility] ULTRA test strip USE AS DIRECTED FOR BLOOD SUGAR TESTING THREE TIMES DAILY FOR DIABETES E11.9 08/19/21   [provider]  PARoxetine (PAXIL) 40 MG tablet Take 1 tablet (40 mg total) by mouth every morning. 11/13/17   Denzil Magnuson, NP  potassium chloride SA (KLOR-CON M) 20 MEQ tablet Take 1 tablet (20 mEq total) by mouth daily. For 4 days then stop. 12/20/22   Doree Fudge M, PA-C  SPIRIVA HANDIHALER 18 MCG inhalation capsule Place 1 capsule into inhaler and inhale daily. 03/22/17   [provider]  triamcinolone ointment (KENALOG) 0.1 % SMARTSIG:1 Application Topical 2-3 Times Daily 07/20/22   [provider]  Vaginal Lubricant (REPLENS) GEL Place 1 application vaginally 3 (three) times daily as needed. Apply to vagina and vulva as needed for itching. Use on days that you do not use clobetasol    [provider]    ___________________________________________________________________________________________________ Physical Exam:    12/24/2022    4:26 PM 12/24/2022    2:45 PM 12/24/2022   12:45 PM  Vitals with BMI  Systolic 111 111 086  Diastolic 64 61 69  Pulse 73  82 78     1. General:  in No  Acute distress   Chronically ill-appearing 2. Psychological: Alert and Oriented 3. Head/ENT:   Dry Mucous  Membranes                          Head Non traumatic, neck supple                         Poor Dentition 4. SKIN: decreased Skin turgor,  Skin clean Dry and intact no rash    5. Heart: Regular rate and rhythm no Murmur, no Rub or gallop 6. Lungs:   no wheezes or crackles   7. Abdomen: Soft,  non-tender, Non distended bowel sounds present 8. Lower extremities: no clubbing, cyanosis, no   edema 9. Neurologically Grossly intact, moving all 4 extremities equally   10. MSK: Normal range of motion    Chart has been reviewed  ______________________________________________________________________________________________  Assessment/Plan  72 y.o. female with medical history significant of CAD sp recent CABG done on 11 December 2022, hypertension, HLD, DM2, PTSD depression hepatic steatosis GERD    Admitted for   Post-op pain     Present on Admission:  Chest pain  Depression  Hypertension  Hyperlipidemia with target LDL less than 70  Anemia  Chronic diastolic CHF (congestive heart failure) (HCC)   Chest pain Appreciate cardiology consult echogram in the morning.  Most likely musculoskeletal.  Troponin remained stable  Depression Chronic stable continue home medications including Paxil 40 mg a day  Hypertension Hold Lopressor 25 mg p.o.  given soft bp  Hyperlipidemia with target LDL less than 70 Continue Lipitor 80 mg p.o. daily  Diabetes mellitus, type 2 (HCC) Order sliding scale Continue Tresiba but decrease dose to 50s patient have had decreased p.o. intake, hold p.o. medications  Anemia Obtain anemia panel  Transfuse for Hg <7 , rapidly dropping or  if symptomatic   Chronic diastolic CHF (congestive heart failure) (HCC) - currently appears to be slightly on the dry side, hold home diuretics for tonight and restart when appears euvolemic, carefuly follow fluid status and Cr   Other plan as per orders.  DVT prophylaxis:  SCD     Code Status:    Code Status: Prior FULL  CODE  as per patient   I had personally discussed CODE STATUS with patient  ACP   none   Family Communication:   Family not at  Bedside    Diet diabetic diet   Disposition Plan:     To home once workup is complete and patient is stable   Following barriers for discharge:                             Chest pain  work up is complete                                                       Will need consultants to evaluate patient prior to discharge                     Would benefit from PT/OT eval prior to DC  Ordered  Consults called: Cardiology is aware   Admission status:  ED Disposition     ED Disposition  Admit   Condition  --   Comment  Hospital Area: [facility] [[id number]]  Level of Care: Progressive [102]  Admit to Progressive based on following criteria: CARDIOVASCULAR & THORACIC of moderate stability with acute coronary syndrome symptoms/low risk myocardial infarction/hypertensive urgency/arrhythmias/heart failure potentially compromising stability and stable post cardiovascular intervention patients.  May place patient in observation at [facility] or [facility] if equivalent level of care is available:: No  Covid Evaluation: Asymptomatic - no recent exposure (last 10 days) testing not required  Diagnosis: Chest pain [161096]  Admitting Physician: Therisa Doyne [[id number]]  Attending Physician: Therisa Doyne [[id number]]           Obs     Level of care      progressive     tele indefinitely please discontinue once patient no longer qualifies COVID-19 Labs   Tamanna Whitson 12/24/2022, 7:45 PM    [facility]     after 2 AM please page floor coverage PA If 7AM-7PM, please contact the day team taking care of the patient using [email or web address]

## 2022-12-24 NOTE — Assessment & Plan Note (Signed)
Order sliding scale Continue Evaristo Bury but decrease dose to 50s patient have had decreased p.o. intake, hold p.o. medications

## 2022-12-25 ENCOUNTER — Other Ambulatory Visit: Payer: Self-pay | Admitting: *Deleted

## 2022-12-25 ENCOUNTER — Observation Stay (HOSPITAL_BASED_OUTPATIENT_CLINIC_OR_DEPARTMENT_OTHER): Payer: Medicare HMO

## 2022-12-25 DIAGNOSIS — I2510 Atherosclerotic heart disease of native coronary artery without angina pectoris: Secondary | ICD-10-CM | POA: Diagnosis not present

## 2022-12-25 DIAGNOSIS — R9431 Abnormal electrocardiogram [ECG] [EKG]: Secondary | ICD-10-CM | POA: Diagnosis not present

## 2022-12-25 DIAGNOSIS — Z951 Presence of aortocoronary bypass graft: Secondary | ICD-10-CM | POA: Diagnosis not present

## 2022-12-25 DIAGNOSIS — I970 Postcardiotomy syndrome: Secondary | ICD-10-CM | POA: Diagnosis not present

## 2022-12-25 DIAGNOSIS — D500 Iron deficiency anemia secondary to blood loss (chronic): Secondary | ICD-10-CM | POA: Diagnosis not present

## 2022-12-25 DIAGNOSIS — R0789 Other chest pain: Secondary | ICD-10-CM | POA: Diagnosis not present

## 2022-12-25 DIAGNOSIS — R079 Chest pain, unspecified: Secondary | ICD-10-CM | POA: Diagnosis not present

## 2022-12-25 DIAGNOSIS — R700 Elevated erythrocyte sedimentation rate: Secondary | ICD-10-CM

## 2022-12-25 DIAGNOSIS — I5032 Chronic diastolic (congestive) heart failure: Secondary | ICD-10-CM | POA: Diagnosis not present

## 2022-12-25 LAB — CBC
HCT: 27.2 % — ABNORMAL LOW (ref 36.0–46.0)
Hemoglobin: 8.5 g/dL — ABNORMAL LOW (ref 12.0–15.0)
MCH: 27.7 pg (ref 26.0–34.0)
MCHC: 31.3 g/dL (ref 30.0–36.0)
MCV: 88.6 fL (ref 80.0–100.0)
Platelets: 465 10*3/uL — ABNORMAL HIGH (ref 150–400)
RBC: 3.07 MIL/uL — ABNORMAL LOW (ref 3.87–5.11)
RDW: 13.8 % (ref 11.5–15.5)
WBC: 9.4 10*3/uL (ref 4.0–10.5)
nRBC: 0 % (ref 0.0–0.2)

## 2022-12-25 LAB — COMPREHENSIVE METABOLIC PANEL
ALT: 9 U/L (ref 0–44)
AST: 27 U/L (ref 15–41)
Albumin: 2.5 g/dL — ABNORMAL LOW (ref 3.5–5.0)
Alkaline Phosphatase: 76 U/L (ref 38–126)
Anion gap: 9 (ref 5–15)
BUN: <5 mg/dL — ABNORMAL LOW (ref 8–23)
CO2: 27 mmol/L (ref 22–32)
Calcium: 8.8 mg/dL — ABNORMAL LOW (ref 8.9–10.3)
Chloride: 104 mmol/L (ref 98–111)
Creatinine, Ser: 0.68 mg/dL (ref 0.44–1.00)
GFR, Estimated: >60 mL/min (ref >60)
Glucose, Bld: 130 mg/dL — ABNORMAL HIGH (ref 70–99)
Potassium: 3.4 mmol/L — ABNORMAL LOW (ref 3.5–5.1)
Sodium: 140 mmol/L (ref 135–145)
Total Bilirubin: 0.4 mg/dL (ref 0.3–1.2)
Total Protein: 6.4 g/dL — ABNORMAL LOW (ref 6.5–8.1)

## 2022-12-25 LAB — ECHOCARDIOGRAM COMPLETE
Area-P 1/2: 4.8 cm2
Height: 65 in
S' Lateral: 2.8 cm
Weight: 2569.6 [oz_av]

## 2022-12-25 LAB — IRON AND TIBC
Iron: 16 ug/dL — ABNORMAL LOW (ref 28–170)
Saturation Ratios: 6 % — ABNORMAL LOW (ref 10.4–31.8)
TIBC: 283 ug/dL (ref 250–450)
UIBC: 267 ug/dL

## 2022-12-25 LAB — PHOSPHORUS: Phosphorus: 4 mg/dL (ref 2.5–4.6)

## 2022-12-25 LAB — VITAMIN B12: Vitamin B-12: 271 pg/mL (ref 180–914)

## 2022-12-25 LAB — GLUCOSE, CAPILLARY
Glucose-Capillary: 186 mg/dL — ABNORMAL HIGH (ref 70–99)
Glucose-Capillary: 130 mg/dL — ABNORMAL HIGH (ref 70–99)
Glucose-Capillary: 125 mg/dL — ABNORMAL HIGH (ref 70–99)
Glucose-Capillary: 157 mg/dL — ABNORMAL HIGH (ref 70–99)

## 2022-12-25 LAB — FOLATE: Folate: 27.1 ng/mL (ref >5.9)

## 2022-12-25 LAB — MAGNESIUM: Magnesium: 1.9 mg/dL (ref 1.7–2.4)

## 2022-12-25 LAB — PREALBUMIN: Prealbumin: 13 mg/dL — ABNORMAL LOW (ref 18–38)

## 2022-12-25 LAB — FERRITIN: Ferritin: 61 ng/mL (ref 11–307)

## 2022-12-25 MED ORDER — SENNOSIDES-DOCUSATE SODIUM 8.6-50 MG PO TABS
1.0000 | ORAL_TABLET | Freq: Two times a day (BID) | ORAL | Status: DC
  Administered 2022-12-25 – 2022-12-26 (×2): 1 via ORAL
  Filled 2022-12-25 (×2): qty 1

## 2022-12-25 MED ORDER — INSULIN ASPART 100 UNIT/ML IJ SOLN
4.0000 [IU] | Freq: Three times a day (TID) | INTRAMUSCULAR | Status: DC
  Administered 2022-12-25 (×2): 4 [IU] via SUBCUTANEOUS

## 2022-12-25 MED ORDER — IBUPROFEN 200 MG PO TABS
600.0000 mg | ORAL_TABLET | Freq: Three times a day (TID) | ORAL | Status: DC
  Administered 2022-12-25 – 2022-12-26 (×3): 600 mg via ORAL
  Filled 2022-12-25 (×3): qty 3

## 2022-12-25 MED ORDER — INSULIN ASPART 100 UNIT/ML IJ SOLN
0.0000 [IU] | Freq: Three times a day (TID) | INTRAMUSCULAR | Status: DC
  Administered 2022-12-25 (×2): 2 [IU] via SUBCUTANEOUS
  Administered 2022-12-26: 5 [IU] via SUBCUTANEOUS

## 2022-12-25 MED ORDER — COLCHICINE 0.6 MG PO TABS
0.6000 mg | ORAL_TABLET | Freq: Two times a day (BID) | ORAL | Status: DC
  Administered 2022-12-25 – 2022-12-26 (×2): 0.6 mg via ORAL
  Filled 2022-12-25 (×2): qty 1

## 2022-12-25 MED ORDER — INSULIN ASPART 100 UNIT/ML IJ SOLN: 0.0000 [IU] | Freq: Every day | INTRAMUSCULAR | Status: DC

## 2022-12-25 MED ORDER — INSULIN GLARGINE-YFGN 100 UNIT/ML ~~LOC~~ SOLN
25.0000 [IU] | Freq: Two times a day (BID) | SUBCUTANEOUS | Status: DC
  Administered 2022-12-25 – 2022-12-26 (×3): 25 [IU] via SUBCUTANEOUS
  Filled 2022-12-25 (×4): qty 0.25

## 2022-12-25 MED ORDER — METOPROLOL TARTRATE 25 MG PO TABS
25.0000 mg | ORAL_TABLET | Freq: Two times a day (BID) | ORAL | Status: DC
  Administered 2022-12-25 – 2022-12-26 (×3): 25 mg via ORAL
  Filled 2022-12-25 (×3): qty 1

## 2022-12-25 MED FILL — Thrombin For Soln 20000 Unit: CUTANEOUS | Qty: 1 | Status: AC

## 2022-12-25 MED FILL — Gelatin Absorbable MT Powder: OROMUCOSAL | Qty: 1 | Status: AC

## 2022-12-25 NOTE — Progress Notes (Addendum)
Patient Name: Erica Estrada Date of Encounter: 12/25/2022 [facility] Cardiologist: Debbe Odea, MD   Interval Summary  .    Sitting up on the side of the bed. Feels well overall.  Vital Signs .    Vitals:   12/24/22 2236 12/24/22 2300 12/24/22 2326 12/25/22 0500  BP: 116/69 125/68 139/67 123/74  Pulse: 88 83    Resp:  19 18 19   Temp: 98.4 F (36.9 C)  98.8 F (37.1 C) 98.8 F (37.1 C)  TempSrc: Oral  Oral Oral  SpO2: 100% 100%    Weight:    72.8 kg  Height:        Intake/Output Summary (Last 24 hours) at 12/25/2022 0818 Last data filed at 12/24/2022 2244 Gross per 24 hour  Intake 740.46 ml  Output --  Net 740.46 ml      12/25/2022    5:00 AM 12/24/2022   11:22 AM 12/20/2022    4:29 AM  Last 3 Weights  Weight (lbs) 160 lb 9.6 oz 164 lb 0.4 oz 164 lb 0.4 oz  Weight (kg) 72.848 kg 74.4 kg 74.4 kg      Telemetry/ECG    Sinus Rhythm - Personally Reviewed  Physical Exam .   GEN: No acute distress.   Neck: No JVD Cardiac: RRR, no murmurs, rubs, or gallops. Healing sternotomy  Respiratory: Clear to auscultation bilaterally. GI: Soft, nontender, non-distended  MS: No edema  Assessment & Plan .     72 y.o. female with a hx of CAD s/p recent CABG x 3 12/13/22, HTN, HLD, DM2, former smoker, PTSD, severe recurrent major depression, hepatic steatosis, multiple thyroid nodules, degenerative disc disease, GERD who was seen 12/24/2022 for the evaluation of chest pain.   Chest pain CAD s/p recent CABG x 3 with LIMA-LAD, SVG-diagonal, SVG-PDA on 12/13/22 -- recently discharged 12/20/22 after CABG, presented back with chest pain. hs troponin 46 --> 45. EKG with J point elevation inferior leads with minimal ST changes in anterior leads and TWI inferolateral leads.  -- chest pain sounded more consistent with MSK pain, possible pericarditis. Sed rate 92, CRP WNL -- EKG this morning with continued TWI in inferolateral leads -- was started on  colchicine 0.6mg  daily along with tramadol. Still complains of chest discomfort but with palpitation and movement. Does not seem pleuritic. I would favor stopping colchicine  -- remains on on 325 mg ASA,  lopressor 25mg  BID  -- echo pending   Prior narcotic use -- PMP search showed norco prescriptions this year that then transitioned to gabapentin   Hypertension -- normotensive on 25 mg lopressor BID   Hyperlipidemia with LDL goal < 70 -- 12/14/2022: Cholesterol 94; HDL 35; LDL Cholesterol 47; Triglycerides 58; VLDL 12 -- continue atorvastatin 80mg  daily    Leukocytosis -- CXR without PNA -- WBC improved today   Prior tobacco use -- cessation encouraged   For questions or updates, please contact  [facility] Please consult [email or web address] for contact info under        Signed, Laverda Page, NP    Patient seen and examined. Agree with assessment and plan.  Patient feels significantly better today.  She denies any pleuritic pain presently.  ESR is significantly elevated at 92.    Symptoms have improved with initiation of therapy possible musculoskeletal pain versus possible pericarditis.  Would treat with nonsteroidal anti-inflammatory agent.  ECG continues to show diffuse T wave abnormality in all leads which is new from the EKG day 1  post CABG surgery and was present last evening.  Echo Doppler study has not yet been done and remains pending for today.  She is iron deficient with iron/TIBC 16/267 with iron saturation at 6%.  Would add supplemental ferrous sulfate.   Lennette Bihari, MD, [facility] 12/25/2022 11:06 AM

## 2022-12-25 NOTE — Evaluation (Signed)
Physical Therapy Evaluation Patient Details Name: Erica Estrada MRN: [id number] DOB: 1950/09/06 Today's Date: 12/25/2022  History of Present Illness  Pt is a 72yo female who underwent CABGx3 on 10/3, admitted 10/14 for chest pain.  PMH: DM II, HTN, HLD, CAD, arthritis, Graves disease, L eye blindness.  Clinical Impression  Pt admitted with above diagnosis. Pt was able to ambulate with RW with overall good safety. Pt needs cues for sternal precautions with activity.  Will benefit from continued HHPT and transition to Cardiac Rehab phase 2 when MD agrees. Will follow acutely.  Pt currently with functional limitations due to the deficits listed below (see PT Problem List). Pt will benefit from acute skilled PT to increase their independence and safety with mobility to allow discharge.         If plan is discharge home, recommend the following: A little help with walking and/or transfers;A little help with bathing/dressing/bathroom;Assistance with cooking/housework;Assist for transportation   Can travel by private vehicle        Equipment Recommendations None recommended by PT  Recommendations for Other Services       Functional Status Assessment Patient has had a recent decline in their functional status and demonstrates the ability to make significant improvements in function in a reasonable and predictable amount of time.     Precautions / Restrictions Precautions Precautions: Sternal Precaution Booklet Issued: No Precaution Comments: Pt able to generally recall sternal precautions but needs intermittent cues for maintenance of precautions during mobility Restrictions Weight Bearing Restrictions: Yes RUE Weight Bearing: Non weight bearing LUE Weight Bearing: Non weight bearing Other Position/Activity Restrictions: sternal precautions      Mobility  Bed Mobility               General bed mobility comments: Pt in chair on arrival    Transfers Overall transfer  level: Needs assistance Equipment used: Rolling walker (2 wheels) Transfers: Sit to/from Stand Sit to Stand: Min assist           General transfer comment: min Cues for hand placement for sternal precautions but no physical assist.    Ambulation/Gait Ambulation/Gait assistance: Contact guard assist Gait Distance (Feet): 380 Feet Assistive device: Rolling walker (2 wheels) Gait Pattern/deviations: Step-through pattern, Decreased stride length, Trunk flexed Gait velocity: dec Gait velocity interpretation: 1.31 - 2.62 ft/sec, indicative of limited community ambulator   General Gait Details: CGA for safety  Stairs            Wheelchair Mobility     Tilt Bed    Modified Rankin (Stroke Patients Only)       Balance Overall balance assessment: Mild deficits observed, not formally tested Sitting-balance support: Feet supported, No upper extremity supported Sitting balance-Leahy Scale: Good Sitting balance - Comments: sitting EOB   Standing balance support: During functional activity, Reliant on assistive device for balance, Bilateral upper extremity supported Standing balance-Leahy Scale: Poor Standing balance comment: with RW support                             Pertinent Vitals/Pain Pain Assessment Pain Assessment: No/denies pain    Home Living Family/patient expects to be discharged to:: Private residence Living Arrangements: Spouse/significant other Available Help at Discharge: Family;Friend(s);Available 24 hours/day Type of Home: Apartment Home Access: Level entry       Home Layout: One level Home Equipment: Rollator (4 wheels);Shower seat;Grab bars - tub/shower;BSC/3in1 Additional Comments: spoke with daughter Erica Estrada who reports herself  and a RN is going to be providing assist to patient as her spouse unable to assist    Prior Function Prior Level of Function : Independent/Modified Independent             Mobility Comments: Using 4WRW  since surgery ADLs Comments: sponge bathing since surgery     Extremity/Trunk Assessment   Upper Extremity Assessment Upper Extremity Assessment: Defer to OT evaluation    Lower Extremity Assessment Lower Extremity Assessment: Generalized weakness    Cervical / Trunk Assessment Cervical / Trunk Assessment: Normal Cervical / Trunk Exceptions: sternal incision  Communication   Communication Communication: No apparent difficulties  Cognition Arousal: Alert Behavior During Therapy: WFL for tasks assessed/performed Overall Cognitive Status: Within Functional Limits for tasks assessed Area of Impairment: Problem solving, Following commands                   Current Attention Level: Focused Memory: Decreased recall of precautions, Decreased short-term memory Following Commands: Follows one step commands with increased time     Problem Solving: Slow processing General Comments: min cues for sternal precautions throughout; greater problem solving and self-application of precautions this session        General Comments General comments (skin integrity, edema, etc.): VSS on RA    Exercises     Assessment/Plan    PT Assessment Patient needs continued PT services  PT Problem List Decreased strength;Decreased activity tolerance;Decreased balance;Decreased mobility;Decreased cognition;Decreased knowledge of use of DME;Decreased safety awareness;Cardiopulmonary status limiting activity       PT Treatment Interventions DME instruction;Gait training;Functional mobility training;Therapeutic activities;Therapeutic exercise    PT Goals (Current goals can be found in the Care Plan section)  Acute Rehab PT Goals Patient Stated Goal: get better PT Goal Formulation: With patient Time For Goal Achievement: 01/08/23 Potential to Achieve Goals: Good    Frequency Min 1X/week     Co-evaluation               AM-PAC PT "6 Clicks" Mobility  Outcome Measure Help needed  turning from your back to your side while in a flat bed without using bedrails?: A Little Help needed moving from lying on your back to sitting on the side of a flat bed without using bedrails?: A Little Help needed moving to and from a bed to a chair (including a wheelchair)?: A Little Help needed standing up from a chair using your arms (e.g., wheelchair or bedside chair)?: A Little Help needed to walk in hospital room?: A Little Help needed climbing 3-5 steps with a railing? : A Lot 6 Click Score: 17    End of Session Equipment Utilized During Treatment: Gait belt Activity Tolerance: Patient tolerated treatment well Patient left: with call bell/phone within reach;in chair;with chair alarm set Nurse Communication: Mobility status PT Visit Diagnosis: Unsteadiness on feet (R26.81);Difficulty in walking, not elsewhere classified (R26.2)    Time: 9562-1308 PT Time Calculation (min) (ACUTE ONLY): 11 min   Charges:   PT Evaluation $PT Eval Moderate Complexity: 1 Mod   PT General Charges $$ ACUTE PT VISIT: 1 Visit         Rosangela Fehrenbach M,PT Acute Rehab Services [phone]   Bevelyn Buckles 12/25/2022, 11:39 AM

## 2022-12-25 NOTE — Progress Notes (Signed)
  Echocardiogram 2D Echocardiogram has been performed.  Erica Estrada 12/25/2022, 5:44 PM

## 2022-12-25 NOTE — Evaluation (Signed)
Occupational Therapy Evaluation Patient Details Name: Erica Estrada MRN: [id number] DOB: January 31, 1951 Today's Date: 12/25/2022   History of Present Illness Pt is a 72yo female who underwent CABGx3 on 10/3, admitted 10/14 for chest pain.  PMH: DM II, HTN, HLD, CAD, arthritis, Graves disease, L eye blindness.   Clinical Impression   Patient admitted for the diagnosis above.  PTA she had just returned home from [facility] s/p CABG.  Patient was supposed to start [facility] services on Monday, but came to the hospital.  She appears to be functioning very close to her baseline, and no significant OT needs exist in the acute setting.  HH can be continued for post acute rehab.         If plan is discharge home, recommend the following: A little help with walking and/or transfers;A lot of help with bathing/dressing/bathroom;Assistance with cooking/housework;Direct supervision/assist for financial management;Direct supervision/assist for medications management;Assist for transportation;Help with stairs or ramp for entrance    Functional Status Assessment  Patient has not had a recent decline in their functional status  Equipment Recommendations  Tub/shower seat    Recommendations for Other Services       Precautions / Restrictions Precautions Precautions: Sternal Precaution Booklet Issued: No Precaution Comments: Pt able to generally recall sternal precautions but needs intermittent cues for maintenance of precautions during mobility and ADL. Restrictions Weight Bearing Restrictions: Yes Other Position/Activity Restrictions: sternal precautions      Mobility Bed Mobility Overal bed mobility: Modified Independent     Sidelying to sit: HOB elevated            Transfers Overall transfer level: Modified independent                        Balance Overall balance assessment: Mild deficits observed, not formally tested                                          ADL either performed or assessed with clinical judgement   ADL Overall ADL's : At baseline                                             Vision Patient Visual Report: No change from baseline Additional Comments: L eye blindness     Perception Perception: Within Functional Limits       Praxis Praxis: WFL       Pertinent Vitals/Pain Pain Assessment Pain Assessment: No/denies pain Pain Intervention(s): Monitored during session     Extremity/Trunk Assessment Upper Extremity Assessment Upper Extremity Assessment: Overall WFL for tasks assessed   Lower Extremity Assessment Lower Extremity Assessment: Defer to PT evaluation   Cervical / Trunk Assessment Cervical / Trunk Assessment: Normal   Communication Communication Communication: No apparent difficulties   Cognition Arousal: Alert Behavior During Therapy: WFL for tasks assessed/performed Overall Cognitive Status: Within Functional Limits for tasks assessed                                       General Comments   VSS on RA.  No SOB noted and no chest pain with activity.      Exercises     Shoulder  Instructions      Home Living Family/patient expects to be discharged to:: Private residence Living Arrangements: Spouse/significant other Available Help at Discharge: Family;Friend(s);Available 24 hours/day Type of Home: Apartment Home Access: Level entry     Home Layout: One level     Bathroom Shower/Tub: Producer, television/film/video: Standard Bathroom Accessibility: Yes   Home Equipment: Rollator (4 wheels);Shower seat;Grab bars - tub/shower;BSC/3in1          Prior Functioning/Environment Prior Level of Function : Independent/Modified Independent             Mobility Comments: Using 4WRW since surgery ADLs Comments: Ind        OT Problem List: Decreased strength;Decreased range of motion;Decreased activity tolerance;Impaired balance (sitting and/or  standing);Decreased cognition;Decreased safety awareness;Impaired vision/perception;Cardiopulmonary status limiting activity      OT Treatment/Interventions: Self-care/ADL training;Balance training;Therapeutic activities    OT Goals(Current goals can be found in the care plan section) Acute Rehab OT Goals Patient Stated Goal: Return home OT Goal Formulation: With patient Time For Goal Achievement: 12/28/22 Potential to Achieve Goals: Good  OT Frequency:      Co-evaluation              AM-PAC OT "6 Clicks" Daily Activity     Outcome Measure Help from another person eating meals?: None Help from another person taking care of personal grooming?: None Help from another person toileting, which includes using toliet, bedpan, or urinal?: None Help from another person bathing (including washing, rinsing, drying)?: None Help from another person to put on and taking off regular upper body clothing?: None Help from another person to put on and taking off regular lower body clothing?: None 6 Click Score: 24   End of Session Equipment Utilized During Treatment: Rollator (4 wheels)  Activity Tolerance: Patient tolerated treatment well Patient left: in chair;with call bell/phone within reach  OT Visit Diagnosis: Unsteadiness on feet (R26.81);Other abnormalities of gait and mobility (R26.89);Muscle weakness (generalized) (M62.81)                Time: 1610-9604 OT Time Calculation (min): 18 min Charges:  OT General Charges $OT Visit: 1 Visit OT Evaluation $OT Eval Moderate Complexity: 1 Mod  12/25/2022  RP, OTR/L  Acute Rehabilitation Services  Office:  [phone]   Suzanna Obey 12/25/2022, 9:22 AM

## 2022-12-25 NOTE — Consult Note (Signed)
WOC Nurse Consult Note: Reason for Consult: Consult requested for sacrum Stage 2 pressure injury. Performed remotely after review of progress notes and bedside nurses' wound care flow sheet, where it was noted as present on admission. These can be treated independently by the bedside nurses using the skin care order set in Epic as follows: Foam dressing to sacrum/buttocks. Change q 3 days or PRN soiling. Please re-consult if further assistance is needed.  Thank-you,  Cammie Mcgee MSN, RN, CWOCN, [place], CNS [phone]

## 2022-12-25 NOTE — Progress Notes (Signed)
Spoke with cardiovascular department and the patient is next on the list to have her echocardiogram.

## 2022-12-25 NOTE — TOC Initial Note (Signed)
Transition of Care [facility]) - Initial/Assessment Note    Patient Details  Name: Erica Estrada MRN: [id number] Date of Birth: Apr 12, 1950  Transition of Care [facility]) CM/SW Contact:    Gala Lewandowsky, RN Phone Number: 12/25/2022, 4:17 PM  Clinical Narrative: Patient presented for chest pain. Patient is currently active with [facility] for PT/OT-adding RN for medication and disease management. Patient will need new orders and face to face. PTA patient was from home with spouse and granddaughter. Patient has DME cane and rolling walker with seat. Patient states she will have transportation home once stable.                   Expected Discharge Plan: Home w Home Health Services Barriers to Discharge: Continued Medical Work up   Patient Goals and CMS Choice Patient states their goals for this hospitalization and ongoing recovery are:: plan for home  Expected Discharge Plan and Services In-house Referral: NA Discharge Planning Services: CM Consult Post Acute Care Choice: Home Health, Resumption of Svcs/PTA Provider Living arrangements for the past 2 months: Single Family Home                   DME Agency: NA    HH Arranged: PT, OT, RN HH Agency: [facility] Date [facility] Agency Contacted: 12/25/22 Time HH Agency Contacted: 1617 Representative spoke with at [facility] Agency: Kandee Keen  Prior Living Arrangements/Services Living arrangements for the past 2 months: Single Family Home Lives with:: Adult Children, Spouse Patient language and need for interpreter reviewed:: Yes Do you feel safe going back to the place where you live?: Yes      Need for Family Participation in Patient Care: Yes (Comment) Care giver support system in place?: Yes (comment) Current home services: DME (rolling walker with seat and cane) Criminal Activity/Legal Involvement Pertinent to Current Situation/Hospitalization: No - Comment as needed  Activities of Daily Living   ADL Screening (condition at  time of admission) Independently performs ADLs?: No Does the patient have a NEW difficulty with bathing/dressing/toileting/self-feeding that is expected to last >3 days?: Yes (Initiates electronic notice to provider for possible OT consult) (recent open heart surgery 12/13/22) Does the patient have a NEW difficulty with getting in/out of bed, walking, or climbing stairs that is expected to last >3 days?: Yes (Initiates electronic notice to provider for possible PT consult) (recnt open heart surgery 12/13/22) Does the patient have a NEW difficulty with communication that is expected to last >3 days?: No Is the patient deaf or have difficulty hearing?: Yes Does the patient have difficulty seeing, even when wearing glasses/contacts?: Yes Does the patient have difficulty concentrating, remembering, or making decisions?: Yes  Permission Sought/Granted Permission sought to share information with : Family Supports, Magazine features editor, Case Estate manager/land agent granted to share information with : Yes, Verbal Permission Granted     Permission granted to share info w AGENCY: [facility]   Emotional Assessment Appearance:: Appears stated age Attitude/Demeanor/Rapport: Engaged Affect (typically observed): Appropriate Orientation: : Oriented to Self, Oriented to Place, Oriented to  Time, Oriented to Situation Alcohol / Substance Use: Not Applicable Psych Involvement: No (comment)  Admission diagnosis:  Post-op pain [G89.18] Chest pain [R07.9] Patient Active Problem List   Diagnosis Date Noted   Sedimentation rate elevation 12/25/2022   T wave inversion in EKG 12/25/2022   Chest pain 12/24/2022   Chronic diastolic CHF (congestive heart failure) (HCC) 12/24/2022   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   Abnormal stress test 11/27/2022  Shortness of breath 11/27/2022   Anemia 01/22/2021   Chest pain of uncertain etiology 01/21/2021   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   Posttraumatic stress  disorder 12/23/2018   Severe recurrent major depression without psychotic features (HCC) 11/10/2017   Abdominal pain, chronic, generalized 09/20/2016   Hepatic steatosis 08/14/2016   Multiple thyroid nodules 01/12/2016   Herniation of lumbar intervertebral disc with radiculopathy 05/20/2013   Degenerative disc disease 07/18/2011   Allergic rhinitis 06/21/2011   GERD (gastroesophageal reflux disease) 04/28/2011   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 [place] [address] Phone: [phone] Fax: [phone]  Social Determinants of Health (SDOH) Social History: SDOH Screenings   Food Insecurity: No Food Insecurity (12/24/2022)  Housing: Low Risk  (12/24/2022)  Transportation Needs: Unmet Transportation Needs (12/24/2022)  Utilities: Not At Risk (12/24/2022)  Alcohol Screen: Medium Risk (11/10/2017)  Tobacco Use: Medium Risk (12/24/2022)   Readmission Risk Interventions    12/20/2022   11:48 AM  Readmission Risk Prevention Plan  Post Dischage Appt Complete  Medication Screening Complete  Transportation Screening Complete

## 2022-12-25 NOTE — Care Management Obs Status (Signed)
MEDICARE OBSERVATION STATUS NOTIFICATION   Patient Details  Name: Erica Estrada MRN: [id number] Date of Birth: 1950-04-07   Medicare Observation Status Notification Given:  Yes    Lawerance Sabal, RN 12/25/2022, 9:09 AM

## 2022-12-25 NOTE — Progress Notes (Signed)
[facility] PROGRESS NOTE    Progress Note  Erica Estrada  NWG:[id number] DOB: Jun 14, 1950 DOA: 12/24/2022 PCP: [name], MD     Brief Narrative:   Erica Estrada is an 72 y.o. female past medical history with a recent cardiac cath in 2017 2024 was found to have have severe triple-vessel disease and elevated right-sided pressures, seen by CT surgery on December 13, 2022 underwent CABG with LIMA to the LAD and SVG to the PAD, and SVG to the diagonal on December 11, 2022 no complications postop, essential hypertension, diabetes mellitus comes in chest pain since surgery getting worse over the weekend, pain is worse with inspiration she relates her medication were discontinued not sent home with any pain medication.  At home she was supposed to be on aspirin 325, 80 mg of Lipitor, Jardiance Lopressor and insulin.  12-lead EKG showed new T wave abnormalities in the inferior lateral leads which were not there postoperatively   Assessment/Plan:   Chest pain possibly due to postop pericarditis: Cardiology was consulted as she has new T wave abnormalities in inferior lateral leads. Similar symptoms are concerning for possible post CABG pericarditis, troponins have remained basically flat not consistent with ACS. Cardiology recommended trend troponins repeat EKG in the morning she was started empirically on colchicine check an ESR and CRP 9. Further management per cardiology. She relates her pain is resolved did not like her food this morning.  Depression: Continue Paxil.  Essential hypertension: Resume metoprolol.  Hyperlipidemia:  continue statins.  Thrombocytosis: Likely acting as an acute phase reactant yesterday 560 today 465. She relates her pain is resolved.  Leukocytosis: No evidence of infection likely reactive possibly due to pericarditis.  Diabetes mellitus type 2: On a diet blood glucose trending up, last A1c of 7.8. Hold metformin. Was started on  long-acting insulin and sliding scale.  Normocytic anemia: Hemoglobin at baseline.  Stage II sacral decubitus ulcer: RN Pressure Injury Documentation: Pressure Injury 12/24/22 Sacrum Stage 2 -  Partial thickness loss of dermis presenting as a shallow open injury with a red, pink wound bed without slough. (Active)  12/24/22 2230  Location: Sacrum  Location Orientation:   Staging: Stage 2 -  Partial thickness loss of dermis presenting as a shallow open injury with a red, pink wound bed without slough.  Wound Description (Comments):   Present on Admission: Yes  Dressing Type Foam - Lift dressing to assess site every shift 12/24/22 2330    DVT prophylaxis: lovenox Family Communication:none Status is: Observation The patient remains OBS appropriate and will d/c before 2 midnights.    Code Status:     Code Status Orders  (From admission, onward)           Start     Ordered   12/24/22 1935  Full code  Continuous       Question:  By:  Answer:  Consent: discussion documented in EHR   12/24/22 1935           Code Status History     Date Active Date Inactive Code Status Order ID Comments User Context   12/13/2022 1245 12/20/2022 1735 Full Code [id number]  Zada Girt Inpatient   11/27/2022 1059 11/27/2022 1959 Full Code [id number]  Yvonne Kendall, MD Inpatient   01/21/2021 2007 01/23/2021 1820 Full Code [id number]  Gertha Calkin, MD ED   11/10/2017 1411 11/13/2017 1800 Full Code [id number]  Jackelyn Poling, NP Inpatient   11/10/2017 1408 11/10/2017 1411 Full  Code [id number]  Oneta Rack, NP Inpatient         IV Access:   Peripheral IV   Procedures and diagnostic studies:   DG Chest 2 View  Result Date: 12/24/2022 CLINICAL DATA:  Chest pain EXAM: CHEST - 2 VIEW COMPARISON:  Chest radiograph dated December 18, 2022. FINDINGS: The heart size and mediastinal contours are unchanged. Aortic calcification. Similar appearance of bandlike atelectasis or consolidation at  the left midlung. Bandlike atelectasis or consolidation at the right lung base is decreased compared to the prior exam. No pneumothorax. Trace left pleural effusion with basilar atelectasis. The visualized osseous structures are unchanged. Prior median sternotomy. IMPRESSION: 1. Persistent bandlike atelectasis or consolidation of the left midlung with improvement at the right lung base. Trace left pleural effusion with left basilar atelectasis. 2. Cardiomegaly. Electronically Signed   By: Hart Robinsons M.D.   On: 12/24/2022 13:49     Medical Consultants:   None.   Subjective:    Erica Estrada pain is resolved, does not like the food  Objective:    Vitals:   12/24/22 2236 12/24/22 2300 12/24/22 2326 12/25/22 0500  BP: 116/69 125/68 139/67 123/74  Pulse: 88 83    Resp:  19 18 19   Temp: 98.4 F (36.9 C)  98.8 F (37.1 C) 98.8 F (37.1 C)  TempSrc: Oral  Oral Oral  SpO2: 100% 100%    Weight:    72.8 kg  Height:       SpO2: 100 %   Intake/Output Summary (Last 24 hours) at 12/25/2022 0801 Last data filed at 12/24/2022 2244 Gross per 24 hour  Intake 740.46 ml  Output --  Net 740.46 ml   Filed Weights   12/24/22 1122 12/25/22 0500  Weight: 74.4 kg 72.8 kg    Exam: General exam: In no acute distress. Respiratory system: Good air movement and clear to auscultation. Cardiovascular system: S1 & S2 heard, RRR. No JVD. Gastrointestinal system: Abdomen is nondistended, soft and nontender.  Extremities: No pedal edema. Skin: No rashes, lesions or ulcers Psychiatry: Judgement and insight appear normal. Mood & affect appropriate.    Data Reviewed:    Labs: Basic Metabolic Panel: Recent Labs  Lab 12/24/22 1117  NA 141  K 3.9  CL 101  CO2 27  GLUCOSE 161*  BUN <5*  CREATININE 0.76  CALCIUM 9.0  MG 1.8  PHOS 3.6   GFR Estimated Creatinine Clearance: 61.7 mL/min (by C-G formula based on SCr of 0.76 mg/dL). Liver Function Tests: Recent Labs  Lab  12/24/22 1117  AST 27  ALT 9  ALKPHOS 79  BILITOT 0.4  PROT 7.0  ALBUMIN 2.8*   No results for input(s): "LIPASE", "AMYLASE" in the last 168 hours. No results for input(s): "AMMONIA" in the last 168 hours. Coagulation profile No results for input(s): "INR", "PROTIME" in the last 168 hours. COVID-19 Labs  Recent Labs    12/24/22 1844 12/25/22 0614  FERRITIN  --  61  CRP 0.9  --     Lab Results  Component Value Date   SARSCOV2NAA NEGATIVE 12/13/2022   SARSCOV2NAA NEGATIVE 04/20/2021   SARSCOV2NAA NEGATIVE 04/04/2021   SARSCOV2NAA NEGATIVE 01/21/2021    CBC: Recent Labs  Lab 12/24/22 1117 12/25/22 0614  WBC 11.1* 9.4  HGB 9.1* 8.5*  HCT 30.2* 27.2*  MCV 90.4 88.6  PLT 560* 465*   Cardiac Enzymes: Recent Labs  Lab 12/24/22 1117  CKTOTAL 41   BNP (last 3 results) No results for  input(s): "PROBNP" in the last 8760 hours. CBG: Recent Labs  Lab 12/19/22 1659 12/19/22 2138 12/20/22 0532 12/24/22 2231 12/25/22 0752  GLUCAP 131* 87 77 182* 186*   D-Dimer: No results for input(s): "DDIMER" in the last 72 hours. Hgb A1c: Recent Labs    12/24/22 1117  HGBA1C 7.8*   Lipid Profile: No results for input(s): "CHOL", "HDL", "LDLCALC", "TRIG", "CHOLHDL", "LDLDIRECT" in the last 72 hours. Thyroid function studies: Recent Labs    12/24/22 1117  TSH 0.378   Anemia work up: Recent Labs    12/24/22 1117 12/25/22 0614  VITAMINB12  --  271  FERRITIN  --  61  TIBC  --  283  IRON  --  16*  RETICCTPCT 3.4*  --    Sepsis Labs: Recent Labs  Lab 12/24/22 1117 12/25/22 0614  WBC 11.1* 9.4   Microbiology No results found for this or any previous visit (from the past 240 hour(s)).   Medications:    aspirin EC  81 mg Oral Daily   atorvastatin  80 mg Oral Daily   colchicine  0.6 mg Oral Daily   docusate sodium  100 mg Oral BID   gabapentin  300 mg Oral QPM   insulin aspart  0-15 Units Subcutaneous TID WC   insulin aspart  0-5 Units Subcutaneous QHS    insulin glargine-yfgn  50 Units Subcutaneous Q2200   methimazole  2.5 mg Oral Daily   PARoxetine  40 mg Oral Daily   senna  1 tablet Oral BID   sodium chloride flush  3 mL Intravenous Q12H   traZODone  50 mg Oral QHS   Continuous Infusions:  sodium chloride     methocarbamol (ROBAXIN) IV        LOS: 0 days   Marinda Elk  [facility]  12/25/2022, 8:01 AM

## 2022-12-26 ENCOUNTER — Other Ambulatory Visit (HOSPITAL_COMMUNITY): Payer: Self-pay

## 2022-12-26 DIAGNOSIS — R072 Precordial pain: Secondary | ICD-10-CM

## 2022-12-26 DIAGNOSIS — R700 Elevated erythrocyte sedimentation rate: Secondary | ICD-10-CM | POA: Diagnosis not present

## 2022-12-26 DIAGNOSIS — G8918 Other acute postprocedural pain: Secondary | ICD-10-CM | POA: Diagnosis not present

## 2022-12-26 DIAGNOSIS — E785 Hyperlipidemia, unspecified: Secondary | ICD-10-CM | POA: Diagnosis not present

## 2022-12-26 DIAGNOSIS — L8990 Pressure ulcer of unspecified site, unspecified stage: Secondary | ICD-10-CM | POA: Insufficient documentation

## 2022-12-26 DIAGNOSIS — R0789 Other chest pain: Secondary | ICD-10-CM | POA: Diagnosis not present

## 2022-12-26 DIAGNOSIS — R9431 Abnormal electrocardiogram [ECG] [EKG]: Secondary | ICD-10-CM | POA: Diagnosis not present

## 2022-12-26 LAB — CBC
HCT: 28.6 % — ABNORMAL LOW (ref 36.0–46.0)
Hemoglobin: 8.8 g/dL — ABNORMAL LOW (ref 12.0–15.0)
MCH: 26.9 pg (ref 26.0–34.0)
MCHC: 30.8 g/dL (ref 30.0–36.0)
MCV: 87.5 fL (ref 80.0–100.0)
Platelets: 544 10*3/uL — ABNORMAL HIGH (ref 150–400)
RBC: 3.27 MIL/uL — ABNORMAL LOW (ref 3.87–5.11)
RDW: 13.6 % (ref 11.5–15.5)
WBC: 7.3 10*3/uL (ref 4.0–10.5)
nRBC: 0 % (ref 0.0–0.2)

## 2022-12-26 LAB — BASIC METABOLIC PANEL
Anion gap: 10 (ref 5–15)
BUN: 7 mg/dL — ABNORMAL LOW (ref 8–23)
CO2: 26 mmol/L (ref 22–32)
Calcium: 8.9 mg/dL (ref 8.9–10.3)
Chloride: 104 mmol/L (ref 98–111)
Creatinine, Ser: 0.68 mg/dL (ref 0.44–1.00)
GFR, Estimated: >60 mL/min (ref >60)
Glucose, Bld: 111 mg/dL — ABNORMAL HIGH (ref 70–99)
Potassium: 3.5 mmol/L (ref 3.5–5.1)
Sodium: 140 mmol/L (ref 135–145)

## 2022-12-26 LAB — T3: T3, Total: 241 ng/dL — ABNORMAL HIGH (ref 71–180)

## 2022-12-26 LAB — MAGNESIUM: Magnesium: 2.1 mg/dL (ref 1.7–2.4)

## 2022-12-26 LAB — GLUCOSE, CAPILLARY
Glucose-Capillary: 82 mg/dL (ref 70–99)
Glucose-Capillary: 222 mg/dL — ABNORMAL HIGH (ref 70–99)

## 2022-12-26 MED ORDER — POTASSIUM CHLORIDE CRYS ER 20 MEQ PO TBCR
40.0000 meq | EXTENDED_RELEASE_TABLET | Freq: Once | ORAL | Status: AC
  Administered 2022-12-26: 40 meq via ORAL
  Filled 2022-12-26: qty 2

## 2022-12-26 MED ORDER — FERROUS SULFATE 325 (65 FE) MG PO TABS
325.0000 mg | ORAL_TABLET | ORAL | 1 refills | Status: AC
  Filled 2022-12-26: qty 15, 30d supply, fill #0

## 2022-12-26 MED ORDER — FERROUS SULFATE 325 (65 FE) MG PO TABS
325.0000 mg | ORAL_TABLET | Freq: Every day | ORAL | Status: DC
  Administered 2022-12-26: 325 mg via ORAL
  Filled 2022-12-26: qty 1

## 2022-12-26 MED ORDER — METOPROLOL TARTRATE 25 MG PO TABS
25.0000 mg | ORAL_TABLET | Freq: Two times a day (BID) | ORAL | 1 refills | Status: DC
  Filled 2022-12-26: qty 60, 30d supply, fill #0

## 2022-12-26 MED ORDER — COLCHICINE 0.6 MG PO TABS
0.6000 mg | ORAL_TABLET | Freq: Two times a day (BID) | ORAL | 0 refills | Status: DC
Start: 2022-12-26 — End: 2023-06-26
  Filled 2022-12-26: qty 37, 19d supply, fill #0

## 2022-12-26 MED ORDER — IBUPROFEN 600 MG PO TABS
600.0000 mg | ORAL_TABLET | Freq: Three times a day (TID) | ORAL | 0 refills | Status: AC
  Filled 2022-12-26: qty 12, 4d supply, fill #0

## 2022-12-26 NOTE — Progress Notes (Addendum)
Patient Name: Erica Estrada Date of Encounter: 12/26/2022 [facility] Cardiologist: Debbe Odea, MD   Interval Summary  .    Sitting up on the side of the bed. Feeling much better, no further chest pain.  Vital Signs .    Vitals:   12/25/22 1457 12/25/22 2010 12/25/22 2123 12/26/22 0432  BP: (!) 119/57 107/64 127/68 120/67  Pulse: 82 91 86 88  Resp: 18 19  17   Temp: 98.4 F (36.9 C) 99 F (37.2 C)  98.7 F (37.1 C)  TempSrc: Oral Oral  Oral  SpO2: 98% 98%    Weight:      Height:        Intake/Output Summary (Last 24 hours) at 12/26/2022 0811 Last data filed at 12/25/2022 2100 Gross per 24 hour  Intake 460 ml  Output --  Net 460 ml      12/25/2022    5:00 AM 12/24/2022   11:22 AM 12/20/2022    4:29 AM  Last 3 Weights  Weight (lbs) 160 lb 9.6 oz 164 lb 0.4 oz 164 lb 0.4 oz  Weight (kg) 72.848 kg 74.4 kg 74.4 kg      Telemetry/ECG    Sinus Rhythm - Personally Reviewed  Physical Exam .   GEN: No acute distress.   Neck: No JVD Cardiac: RRR, no murmurs, rubs, or gallops.  Respiratory: Clear to auscultation bilaterally. GI: Soft, nontender, non-distended  MS: No edema  Assessment & Plan .     72 y.o. female with a hx of CAD s/p recent CABG x 3 12/13/22, HTN, HLD, DM2, former smoker, PTSD, severe recurrent major depression, hepatic steatosis, multiple thyroid nodules, degenerative disc disease, GERD who was seen 12/24/2022 for the evaluation of chest pain.    Chest pain CAD s/p recent CABG x 3 with LIMA-LAD, SVG-diagonal, SVG-PDA on 12/13/22 Pericarditis -- recently discharged 12/20/22 after CABG, presented back with chest pain. hs troponin 46 --> 45. EKG with J point elevation inferior leads with minimal ST changes in anterior leads and TWI inferolateral leads.  -- chest pain sounded more consistent with MSK pain, possible pericarditis. Sed rate 92, CRP WNL -- EKG this morning with continued TWI in inferolateral leads -- was started  on colchicine 0.6mg  BID along with tramadol. Ibuprofen 600mg  TID started per MD yesterday. Would continue ibuprofen for total of 5 days as symptoms have already improved.  -- remains on on 81 mg ASA,  lopressor 25mg  BID  -- echo showed LVEF of 55-60%, g1dd, wall motion suggestive of post op changes  Prior narcotic use -- PMP search showed norco prescriptions this year that then transitioned to gabapentin   Hypertension -- normotensive on 25 mg lopressor BID   Hyperlipidemia with LDL goal < 70 -- 12/14/2022: Cholesterol 94; HDL 35; LDL Cholesterol 47; Triglycerides 58; VLDL 12 -- continue atorvastatin 80mg  daily    Leukocytosis -- CXR without PNA -- resolved   Prior tobacco use -- cessation encouraged  Anemia  -- started on oral Iron yesterday   Will arrange for outpatient follow up   For questions or updates, please contact [facility] Please consult [email or web address] for contact info under        Signed, Laverda Page, NP    Patient seen and examined. Agree with assessment and plan.Pt feels much improved. No further chest pain or pain with deep breathing.  Tolerating colchicine and ibuprofen. Now on Fe with Fe deficiency 6% sat. OK for DC today. Will need f/u as  previously scheduled post CABG.   Lennette Bihari, MD, [facility] 12/26/2022 10:23 AM

## 2022-12-26 NOTE — Plan of Care (Signed)
  Problem: Cardiovascular: Goal: Ability to achieve and maintain adequate cardiovascular perfusion will improve Outcome: Progressing Goal: Vascular access site(s) Level 0-1 will be maintained Outcome: Progressing   Problem: Metabolic: Goal: Ability to maintain appropriate glucose levels will improve Outcome: Progressing   Problem: Health Behavior/Discharge Planning: Goal: Ability to manage health-related needs will improve Outcome: Progressing   Problem: Clinical Measurements: Goal: Ability to maintain clinical measurements within normal limits will improve Outcome: Progressing Goal: Respiratory complications will improve Outcome: Progressing Goal: Cardiovascular complication will be avoided Outcome: Progressing   Problem: Safety: Goal: Ability to remain free from injury will improve Outcome: Progressing

## 2022-12-26 NOTE — Discharge Summary (Signed)
Physician Discharge Summary  Erica Estrada ZOX:[id number] DOB: December 26, 1950 DOA: 12/24/2022  PCP: Erica Sato, MD  Admit date: 12/24/2022 Discharge date: 12/26/2022  Time spent: 40 minutes  Recommendations for Outpatient Follow-up:  Follow outpatient CBC/CMP  Follow colchicine outpatient - will defer duration to cardiology outpatient Follow iron def outpatient Follow low normal B12 outpatient - repeat, consider MMA level    Discharge Diagnoses:  Principal Problem:   Chest pain Active Problems:   Depression   Hypertension   Hyperlipidemia with target LDL less than 70   Diabetes mellitus, type 2 (HCC)   Anemia   Chronic diastolic CHF (congestive heart failure) (HCC)   Sedimentation rate elevation   T wave inversion in EKG   Discharge Condition: stable  Diet recommendation: heart healthy, diabetic  Filed Weights   12/24/22 1122 12/25/22 0500  Weight: 74.4 kg 72.8 kg    History of present illness:   Erica Estrada is an 72 y.o. 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  At home she was supposed to be on aspirin 325, 80 mg of Lipitor, Jardiance Lopressor and insulin.  12-lead EKG showed new T wave abnormalities in the inferior lateral leads which were not there postoperatively   She was admitted for chest pain.  There was concern for post op pericarditis.  She's improved with colchicine and ibuprofen.  Plan to discharge on these and follow outpatient per cards.  See below  and previous notes for additional details   Hospital Course:  Assessment and Plan:  Chest pain Possible Pericarditis Appreciate cardiology assistance, planning 5 days ibuprofen.  Will continue colchicine until follow up and additional recommendations regarding colchicine to be made at that time per my discussion with cards.   Echo with EF 55-60%, grade 1 diastolic dysfunction, wall motion suggestive of post operative septal changes Sed rate 92   Depression: Continue Paxil.   Essential hypertension: Resume metoprolol.   Hyperlipidemia:  continue statins.   Thrombocytosis: Will trend outpatient   Leukocytosis: resolved   Diabetes mellitus type 2: Resume home meds at discharge (denies issues with hypoglycemia at home, tolerates regimen well)    Normocytic anemia: Hemoglobin at baseline.   Stage II sacral decubitus ulcer: Pressure Injury 12/24/22 Sacrum Stage 2 -  Partial thickness loss of dermis presenting as Erica Estrada shallow open injury with Erica Estrada red, pink wound bed without slough. (Active)  12/24/22 2230  Location: Sacrum  Location Orientation:   Staging: Stage 2 -  Partial thickness loss of dermis presenting as Erica Estrada shallow open injury with [name] red, pink wound bed without slough.  Wound Description (Comments):   Present on Admission: Yes      Procedures: Echo IMPRESSIONS     1. Left ventricular ejection fraction, by estimation, is 55 to 60%. The  left ventricle has normal function. The left ventricle demonstrates  regional wall motion abnormalities (see scoring diagram/findings for  description). Left ventricular diastolic  parameters are consistent with Grade I diastolic dysfunction (impaired  relaxation). Wall motion suggestive of post-operative  septal changes.   2. Right ventricular systolic function is normal. The right ventricular  size is normal. There is normal pulmonary artery systolic pressure.   3. The mitral valve is normal in structure. No evidence of mitral  valve  regurgitation. No evidence of mitral stenosis.   4. The aortic valve is tricuspid. There is mild calcification of the  aortic valve. There is mild thickening of the aortic valve. Aortic valve  regurgitation is trivial. Aortic valve sclerosis is present, with no  evidence of aortic valve stenosis.   5. Cannot exclude Erica Estrada small PFO.   Comparison(s): Prior images reviewed side by side. Septal wall motion is  new- consistent with patient having bypass after last echocardiogram.    Consultations: cardiology  Discharge Exam: Vitals:   12/26/22 0432 12/26/22 1039  BP: 120/67 126/65  Pulse: 88 74  Resp: 17 19  Temp: 98.7 F (37.1 C) 97.9 F (36.6 C)  SpO2:  99%   Eager to discharge  General: No acute distress. Cardiovascular: RRR, no CP to palpation - midline sternotomy incision healing well  Lungs: unlabored Neurological: Alert and oriented 3. Moves all extremities 4 with equal strength. Cranial nerves II through XII grossly intact. Extremities: No clubbing or cyanosis. No edema.   Discharge Instructions   Discharge Instructions     Call MD for:  difficulty breathing, headache or visual disturbances   Complete by: As directed    Call MD for:  extreme fatigue   Complete by: As directed    Call MD for:  hives   Complete by: As directed    Call MD for:  persistant dizziness or light-headedness   Complete by: As directed    Call MD for:  persistant nausea and vomiting   Complete by: As directed    Call MD for:  redness, tenderness, or signs of infection (pain, swelling, redness, odor or green/yellow discharge around incision site)   Complete by: As directed    Call MD for:  severe uncontrolled pain   Complete by: As directed    Call MD for:  temperature >100.4   Complete by: As directed    Diet - low sodium heart healthy   Complete by: As directed    Discharge instructions   Complete by: As directed    You were seen for chest pain.  We think this is  musculoskeletal, but this could also be inflammation of the sac surrounding your heart.    You'll discharge home with [name] few days of ibuprofen as well as Erica Estrada medicine called colchicine.  Take colchicine until your follow up with your cardiologist outpatient.  Ask your cardiology team if you should continue this outpatient beyond your follow up appointment.  You have iron deficiency.  You should follow this up with your PCP outpatient.  Your b12 levels are low normal, you'll need to follow this up  with your outpatient provider (consider repeat labs and Erica Estrada MMA level).   Return for new, recurrent, or worsening symptoms.  Please ask your PCP to request records from this hospitalization so they know what was done and what the next steps will be.   Discharge wound care:   Complete by: As directed    Foam dressing to sacrum/buttocks. Change q 3 days or PRN soiling.   Increase activity slowly   Complete by: As directed       Allergies as of 12/26/2022       Reactions   Ace Inhibitors Cough   Chronic  cough for 3 months. Trial off lisinopril starting 05/11/2011 effective in stopping cough.   Fluticasone Other (See Comments)   choking        Medication List     STOP taking these medications    furosemide 40 MG tablet Commonly known as: LASIX       TAKE these medications    Aspercreme Lidocaine 4 % Crea Generic drug: Lidocaine HCl Apply 1 application  topically 2 (two) times daily as needed (pain). Back and knees.   aspirin EC 81 MG tablet Take 81 mg by mouth daily. Swallow whole.   atorvastatin 80 MG tablet Commonly known as: LIPITOR Take 1 tablet (80 mg total) by mouth daily.   calcium carbonate 500 MG chewable tablet Commonly known as: TUMS - dosed in mg elemental calcium Chew 2 tablets by mouth 2 (two) times daily as needed for indigestion or heartburn.   camphor-menthol lotion Commonly known as: SARNA Apply 1 application topically as needed for itching.    Cholecalciferol 25 MCG (1000 UT) capsule Take 1,000 Units by mouth daily.   colchicine 0.6 MG tablet Take 1 tablet (0.6 mg total) by mouth 2 (two) times daily. Follow up on 10/24 with Charlsie Quest to discuss whether you need to continue this   cyclobenzaprine 5 MG tablet Commonly known as: FLEXERIL Take 5 mg by mouth at bedtime.   ferrous sulfate 325 (65 FE) MG tablet Take 1 tablet (325 mg total) by mouth every other day. What changed: when to take this   folic acid 1 MG tablet Commonly known as: FOLVITE Take 1 mg by mouth daily.   gabapentin 300 MG capsule Commonly known as: NEURONTIN Take 300 mg by mouth every evening.   HYDROcodone-acetaminophen 5-325 MG tablet Commonly known as: NORCO/VICODIN Take 1 tablet by mouth every 6 (six) hours as needed.   ibuprofen 600 MG tablet Commonly known as: ADVIL Take 1 tablet (600 mg total) by mouth 3 (three) times daily for 4 days.   Jardiance 25 MG Tabs tablet Generic drug: empagliflozin Take 25 mg by mouth daily.   metFORMIN 500 MG tablet Commonly known as: GLUCOPHAGE Take 1,000 mg by mouth 2 (two) times daily with Quinisha Mould meal.   methimazole 5 MG tablet Commonly known as: TAPAZOLE Take 2.5 mg by mouth daily.   metoprolol tartrate 25 MG tablet Commonly known as: LOPRESSOR Take 1 tablet (25 mg total) by mouth 2 (two) times daily.   mometasone 50 MCG/ACT nasal spray Commonly known as: NASONEX Place 2 sprays into the nose daily as needed.   NovoLOG FlexPen 100 UNIT/ML FlexPen Generic drug: insulin aspart Inject 8 Units into the skin every evening.   OneTouch Delica Plus Lancet33G Misc Apply topically.   OneTouch Ultra test strip Generic drug: glucose blood USE AS DIRECTED FOR BLOOD SUGAR TESTING THREE TIMES DAILY FOR DIABETES E11.9   PARoxetine 40 MG tablet Commonly known as: PAXIL Take 1 tablet (40 mg total) by mouth every morning.   potassium chloride SA 20 MEQ tablet Commonly known as: KLOR-CON M Take 1 tablet  (20 mEq total) by mouth daily. For 4 days then stop.   Spiriva HandiHaler 18 MCG inhalation capsule Generic drug: tiotropium Place 1 capsule into inhaler and inhale daily.   traZODone 50 MG tablet Commonly known as: DESYREL Take 50 mg by mouth at bedtime.   Evaristo Bury FlexTouch 200 UNIT/ML FlexTouch Pen Generic drug: insulin degludec Inject 65 Units into the skin at bedtime.   Ventolin HFA 108 (90 Base) MCG/ACT  inhaler Generic drug: albuterol Inhale 2 puffs into the lungs every 6 (six) hours as needed.               Discharge Care Instructions  (From admission, onward)           Start     Ordered   12/26/22 0000  Discharge wound care:       Comments: Foam dressing to sacrum/buttocks. Change q 3 days or PRN soiling.   12/26/22 1153           Allergies  Allergen Reactions   Ace Inhibitors Cough    Chronic cough for 3 months. Trial off lisinopril starting 05/11/2011 effective in stopping cough.   Fluticasone Other (See Comments)    choking    Follow-up Information     Your physician In 1 day.          [facility], [facility] Follow up.   Specialty: Home Health Services Why: Registered Nurse, Physical and Occupational Therapy-office to call with visit times. Contact information: [address] [place] [address] [phone]                  The results of significant diagnostics from this hospitalization (including imaging, microbiology, ancillary and laboratory) are listed below for reference.    Significant Diagnostic Studies: ECHOCARDIOGRAM COMPLETE  Result Date: 12/25/2022    ECHOCARDIOGRAM REPORT   Patient Name:   Erica Estrada Date of Exam: 12/25/2022 Medical Rec #:  [id number]             Height:       65.0 in Accession #:    [id number]            Weight:       160.6 lb Date of Birth:  [birth year]             BSA:          1.802 m Patient Age:    72 years              BP:           119/57 mmHg Patient Gender: F                      HR:           79 bpm. Exam Location:  Inpatient Procedure: 2D Echo, Cardiac Doppler and Color Doppler Indications:    Chest pain  History:        Patient has prior history of Echocardiogram examinations, most                 recent 11/27/2022. Prior CABG; Risk Factors:Hypertension,                 Diabetes and Dyslipidemia.  Sonographer:    Delcie Roch RDCS Referring Phys: [id number] ANASTASSIA DOUTOVA IMPRESSIONS  1. Left ventricular ejection fraction, by estimation, is 55 to 60%. The left ventricle has normal function. The left ventricle demonstrates regional wall motion abnormalities (see scoring diagram/findings for description). Left ventricular diastolic parameters are consistent with Grade I diastolic dysfunction (impaired relaxation). Wall motion suggestive of post-operative septal changes.  2. Right ventricular systolic function is normal. The right ventricular size is normal. There is normal pulmonary artery systolic pressure.  3. The mitral valve is normal in structure. No evidence of mitral valve regurgitation. No evidence of mitral stenosis.  4. The aortic valve is tricuspid. There is mild calcification of the aortic  valve. There is mild thickening of the aortic valve. Aortic valve regurgitation is trivial. Aortic valve sclerosis is present, with no evidence of aortic valve stenosis.  5. Cannot exclude Antwanette Wesche small PFO. Comparison(s): Prior images reviewed side by side. Septal wall motion is new- consistent with patient having bypass after last echocardiogram. FINDINGS  Left Ventricle: Left ventricular ejection fraction, by estimation, is 55 to 60%. The left ventricle has normal function. The left ventricle demonstrates regional wall motion abnormalities. The left ventricular internal cavity size was normal in size. There is no left ventricular hypertrophy. Abnormal (paradoxical) septal motion consistent with post-operative status. Left ventricular diastolic parameters are consistent with Grade  I diastolic dysfunction (impaired relaxation).  LV Wall Scoring: The anterior septum is hypokinetic. Wall motion suggestive of post-operative septal changes. Right Ventricle: The right ventricular size is normal. No increase in right ventricular wall thickness. Right ventricular systolic function is normal. There is normal pulmonary artery systolic pressure. The tricuspid regurgitant velocity is 2.44 m/s, and  with an assumed right atrial pressure of 3 mmHg, the estimated right ventricular systolic pressure is 26.8 mmHg. Left Atrium: Left atrial size was normal in size. Right Atrium: Right atrial size was normal in size. Pericardium: There is no evidence of pericardial effusion. Mitral Valve: The mitral valve is normal in structure. No evidence of mitral valve regurgitation. No evidence of mitral valve stenosis. Tricuspid Valve: The tricuspid valve is normal in structure. Tricuspid valve regurgitation is mild . No evidence of tricuspid stenosis. Aortic Valve: The aortic valve is tricuspid. There is mild calcification of the aortic valve. There is mild thickening of the aortic valve. There is mild aortic valve annular calcification. Aortic valve regurgitation is trivial. Aortic valve sclerosis is  present, with no evidence of aortic valve stenosis. Pulmonic Valve: The pulmonic valve was normal in structure. Pulmonic valve regurgitation is mild. No evidence of pulmonic stenosis. Aorta: The aortic root and ascending aorta are structurally normal, with no evidence of dilitation. IAS/Shunts: Cannot exclude Elizabeth Haff small PFO.  LEFT VENTRICLE PLAX 2D LVIDd:         4.10 cm   Diastology LVIDs:         2.80 cm   LV e' medial:    6.31 cm/s LV PW:         0.80 cm   LV E/e' medial:  12.7 LV IVS:        0.80 cm   LV e' lateral:   12.20 cm/s LVOT diam:     2.00 cm   LV E/e' lateral: 6.6 LV SV:         55 LV SV Index:   31 LVOT Area:     3.14 cm  RIGHT VENTRICLE            IVC RV Basal diam:  2.90 cm    IVC diam: 1.50 cm RV S prime:      3.48 cm/s LEFT ATRIUM             Index        RIGHT ATRIUM           Index LA diam:        3.40 cm 1.89 cm/m   RA Area:     13.00 cm LA Vol (A2C):   49.5 ml 27.47 ml/m  RA Volume:   28.90 ml  16.04 ml/m LA Vol (A4C):   38.8 ml 21.53 ml/m LA Biplane Vol: 45.1 ml 25.03 ml/m  AORTIC VALVE LVOT Vmax:  90.80 cm/s LVOT Vmean:  62.600 cm/s LVOT VTI:    0.175 m  AORTA Ao Root diam: 3.10 cm Ao Asc diam:  3.10 cm MITRAL VALVE               TRICUSPID VALVE MV Area (PHT): 4.80 cm    TR Peak grad:   23.8 mmHg MV Decel Time: 158 msec    TR Vmax:        244.00 cm/s MV E velocity: 80.20 cm/s MV Marialuiza Car velocity: 90.80 cm/s  SHUNTS MV E/Stephanye Finnicum ratio:  0.88        Systemic VTI:  0.18 m                            Systemic Diam: 2.00 cm Riley Lam MD Electronically signed by Riley Lam MD Signature Date/Time: 12/25/2022/5:50:51 PM    Final    DG Chest 2 View  Result Date: 12/24/2022 CLINICAL DATA:  Chest pain EXAM: CHEST - 2 VIEW COMPARISON:  Chest radiograph dated December 18, 2022. FINDINGS: The heart size and mediastinal contours are unchanged. Aortic calcification. Similar appearance of bandlike atelectasis or consolidation at the left midlung. Bandlike atelectasis or consolidation at the right lung base is decreased compared to the prior exam. No pneumothorax. Trace left pleural effusion with basilar atelectasis. The visualized osseous structures are unchanged. Prior median sternotomy. IMPRESSION: 1. Persistent bandlike atelectasis or consolidation of the left midlung with improvement at the right lung base. Trace left pleural effusion with left basilar atelectasis. 2. Cardiomegaly. Electronically Signed   By: Hart Robinsons M.D.   On: 12/24/2022 13:49   DG Chest 2 View  Result Date: 12/18/2022 CLINICAL DATA:  Pleural effusion, shortness of breath EXAM: CHEST - 2 VIEW COMPARISON:  12/16/2022 FINDINGS: Cardiomegaly status post median sternotomy. Unchanged, bandlike atelectasis or consolidation of the  left midlung and right lung base. Trace pleural effusions. No acute osseous findings. IMPRESSION: 1. Unchanged, bandlike atelectasis or consolidation of the left midlung and right lung base. Trace pleural effusions. No new airspace opacity. 2. Cardiomegaly. Electronically Signed   By: Jearld Lesch M.D.   On: 12/18/2022 13:06   DG Chest Port 1 View  Result Date: 12/16/2022 CLINICAL DATA:  Pneumothorax EXAM: PORTABLE CHEST 1 VIEW COMPARISON:  X-ray 12/15/2022 FINDINGS: Interval removal of the bilateral chest tubes. Stable right IJ Cordis. Sternal wires. Epicardial leads. Right basilar scar or atelectasis. Underinflation. Enlarged cardiopericardial silhouette with some vascular congestion. No pneumothorax, edema or effusion. IMPRESSION: Interval removal of bilateral chest tubes. No developing pneumothorax at this time Electronically Signed   By: Karen Kays M.D.   On: 12/16/2022 09:25   DG Chest Port 1 View  Result Date: 12/15/2022 CLINICAL DATA:  Post CABG EXAM: PORTABLE CHEST - 1 VIEW COMPARISON:  the previous day's study FINDINGS: Swan-Ganz has been retracted leading right IJ venous sheath in place. Bilateral chest tubes with no evident pneumothorax. Coarse right basilar and left perihilar opacities , improved since previous CABG markers.  Aortic Atherosclerosis (ICD10-170.0). Sternotomy wires. No effusion. IMPRESSION: Improving aeration with persistent coarse right basilar and left perihilar opacities. Electronically Signed   By: Corlis Leak M.D.   On: 12/15/2022 10:23   DG Chest Port 1 View  Result Date: 12/14/2022 CLINICAL DATA:  [id number] S/P CABG x 3 [id number]. EXAM: PORTABLE CHEST 1 VIEW COMPARISON:  12/13/2022. FINDINGS: Low lung volume. Persistent central vascular congestion. No frank pulmonary edema. There are atelectatic changes at the left  lung base with small left pleural effusion, unchanged. Right lateral costophrenic angle is clear. No pneumothorax on either side. Stable cardio-mediastinal  silhouette. No acute osseous abnormalities. The soft tissues are within normal limits. Tube/lines: Status post recent median sternotomy/CABG. Interval removal of previously seen endotracheal and enteric tubes. Mediastinal drainage tubes in bilateral pleural drainage catheters are unchanged in position. Stable positioning of the right IJ Swan-Ganz catheter with its tip overlying the right main pulmonary artery. IMPRESSION: 1. Interval removal of endotracheal and enteric tubes. Otherwise stable support apparatus. 2. Low lung volume with persistent central vascular congestion and small left pleural effusion. Electronically Signed   By: Jules Schick M.D.   On: 12/14/2022 08:23   DG Chest Port 1 View  Result Date: 12/13/2022 CLINICAL DATA:  [id number] S/P CABG x 3 [id number] EXAM: PORTABLE CHEST 1 VIEW COMPARISON:  CXR 12/11/22 FINDINGS: Status post median sternotomy and CABG. Endotracheal tube terminates 3 cm above the carina. Enteric tube courses below diaphragm with the side hole poorly visualized on the tip out of the field of view. Bilateral thoracostomy tubes place. Right-sided central venous catheter sheath and PA catheter in place. There is also Krystale Rinkenberger mediastinal drain. Low lung volumes. No pleural effusion. No pneumothorax. Hazy opacity at the left lung base is favored to represent atelectasis. No radiographically apparent displaced rib fractures. Visualized upper abdomen is unremarkable. IMPRESSION: 1. Status post median sternotomy and CABG. Support apparatus as above. 2. Low lung volumes with likely left basilar atelectasis. No pneumothorax. Electronically Signed   By: Lorenza Cambridge M.D.   On: 12/13/2022 15:35   ECHO INTRAOPERATIVE TEE  Result Date: 12/13/2022  *INTRAOPERATIVE TRANSESOPHAGEAL REPORT *  Patient Name:   Erica Estrada Date of Exam: 12/13/2022 Medical Rec #:  [id number]         Height:       65.0 in Accession #:    [id number]       Weight:       164.0 lb Date of Birth:  [birth year]        BSA:           1.82 m Patient Age:    72 years         BP:           171/71 mmHg Patient Gender: F                HR:           72 bpm. Exam Location:  Anesthesiology Transesophogeal exam was perform intraoperatively during surgical procedure. Patient was closely monitored under general anesthesia during the entirety of examination. Indications:     R07.9* Chest pain, unspecified Performing Phys: Arrie Aran MD Complications: No known complications during this procedure. POST-OP IMPRESSIONS _ Left Ventricle: The left ventricle is unchanged from pre-bypass. has normal systolic function, with an ejection fraction of 60%. The cavity size was normal. The wall motion is normal. _ Right Ventricle: The right ventricle appears unchanged from pre-bypass normal function. _ Aorta: The aorta appears unchanged from pre-bypass. _ Left Atrial Appendage: The left atrial appendage appears unchanged from pre-bypass. _ Aortic Valve: The aortic valve appears unchanged from pre-bypass. No stenosis present. There is no regurgitation. _ Mitral Valve: There is trivial-mild regurgitation. _ Tricuspid Valve: There is mild regurgitation. _ Pulmonic Valve: The pulmonic valve appears unchanged from pre-bypass. _ Interatrial Septum: The interatrial septum appears unchanged from pre-bypass. _ Pericardium: The pericardium appears unchanged from pre-bypass. _ Comments: Post-bypass images reviewed with surgeon. PRE-OP FINDINGS  Left Ventricle: The left ventricle has normal systolic function, with an ejection fraction of 55-60%. The cavity size was normal. There is no left ventricular hypertrophy. Right Ventricle: The right ventricle has normal systolic function. The cavity was normal. There is no increase in right ventricular wall thickness. Left Atrium: Left atrial size was normal in size. No left atrial/left atrial appendage thrombus was detected. Left atrial appendage velocity is normal at greater than 40 cm/s. Right Atrium: Right atrial size was normal in  size. Interatrial Septum: No atrial level shunt detected by color flow Doppler. Pericardium: There is no evidence of pericardial effusion. Mitral Valve: The mitral valve is normal in structure. Mitral valve regurgitation is trivial by color flow Doppler. There is mild thickening present. Tricuspid Valve: The tricuspid valve was normal in structure. Tricuspid valve regurgitation is trivial by color flow Doppler. Aortic Valve: The aortic valve is tricuspid Aortic valve regurgitation was not visualized by color flow Doppler. There is no stenosis of the aortic valve. Pulmonic Valve: The pulmonic valve was normal in structure. Pulmonic valve regurgitation is not visualized by color flow Doppler. Aorta: The aortic root, ascending aorta and aortic arch are normal in size and structure. Pulmonary Artery: The pulmonary artery is of normal size.  Arrie Aran MD Electronically signed by Arrie Aran MD Signature Date/Time: 12/13/2022/1:58:00 PM    Final    EP STUDY  Result Date: 12/13/2022 See surgical note for result.  DG Chest 2 View  Result Date: 12/12/2022 CLINICAL DATA:  Preop for coronary artery bypass. EXAM: CHEST - 2 VIEW COMPARISON:  Chest CT 02/13/2022 FINDINGS: The cardiomediastinal contours are normal. Aortic atherosclerosis. Pulmonary vasculature is normal. No consolidation, pleural effusion, or pneumothorax. Leftward tracheal deviation is chronic, likely due to heterogeneous enlargement of the thyroid gland. No acute osseous abnormalities are seen. IMPRESSION: No acute chest findings. Electronically Signed   By: Narda Rutherford M.D.   On: 12/12/2022 15:39   VAS US DOPPLER PRE CABG  Result Date: 12/11/2022 PREOPERATIVE VASCULAR EVALUATION Patient Name:  Erica Estrada  Date of Exam:   12/11/2022 Medical Rec #: [id number]         Accession #:    [id number] Date of Birth: [birth year]         Patient Gender: F Patient Age:   64 years Exam Location:  [facility] Procedure:      VAS US DOPPLER PRE  CABG Referring Phys: Evelene Croon --------------------------------------------------------------------------------  Indications:      Pre-CABG. Risk Factors:     Hypertension, hyperlipidemia, Diabetes, past history of                   smoking, coronary artery disease. Limitations:      Poor ultrasound/ tissue interface Comparison Study: No previous exams Performing Technologist: Hill, Jody RVT, RDMS  Examination Guidelines: Karma Ansley complete evaluation includes B-mode imaging, spectral Doppler, color Doppler, and power Doppler as needed of all accessible portions of each vessel. Bilateral testing is considered an integral part of Lehua Flores complete examination. Limited examinations for reoccurring indications may be performed as noted.  Right Carotid Findings: +----------+--------+--------+--------+--------+------------------+           PSV cm/sEDV cm/sStenosisDescribeComments           +----------+--------+--------+--------+--------+------------------+ CCA Prox  94      12                                         +----------+--------+--------+--------+--------+------------------+  CCA Distal103     16                      intimal thickening +----------+--------+--------+--------+--------+------------------+ ICA Prox  71      10                                         +----------+--------+--------+--------+--------+------------------+ ICA Distal49      15                                         +----------+--------+--------+--------+--------+------------------+ ECA       95      0                                          +----------+--------+--------+--------+--------+------------------+ +----------+--------+-------+----------------+------------+           PSV cm/sEDV cmsDescribe        Arm Pressure +----------+--------+-------+----------------+------------+ [id number]            Multiphasic, WNL             +----------+--------+-------+----------------+------------+  +---------+--------+--+--------+-+---------+ VertebralPSV cm/s39EDV cm/s9Antegrade +---------+--------+--+--------+-+---------+ Left Carotid Findings: +----------+--------+--------+--------+--------+--------+           PSV cm/sEDV cm/sStenosisDescribeComments +----------+--------+--------+--------+--------+--------+ CCA Prox  89      15                               +----------+--------+--------+--------+--------+--------+ CCA Distal80      14                               +----------+--------+--------+--------+--------+--------+ ICA Prox  80      15                               +----------+--------+--------+--------+--------+--------+ ICA Distal63      14                               +----------+--------+--------+--------+--------+--------+ ECA       77                                       +----------+--------+--------+--------+--------+--------+ +----------+--------+--------+----------------+------------+ SubclavianPSV cm/sEDV cm/sDescribe        Arm Pressure +----------+--------+--------+----------------+------------+           146             Multiphasic, WNL             +----------+--------+--------+----------------+------------+ +---------+--------+--+--------+--+---------+ VertebralPSV cm/s30EDV cm/s10Antegrade +---------+--------+--+--------+--+---------+  ABI Findings: +---------+------------------+-----+---------+--------+ Right    Rt Pressure (mmHg)IndexWaveform Comment  +---------+------------------+-----+---------+--------+ Brachial 148                    triphasic         +---------+------------------+-----+---------+--------+ PTA      154               1.01 biphasic          +---------+------------------+-----+---------+--------+  DP       120               0.79 biphasic          +---------+------------------+-----+---------+--------+ Great Toe122               0.80 Normal             +---------+------------------+-----+---------+--------+ +---------+------------------+-----+---------+-------+ Left     Lt Pressure (mmHg)IndexWaveform Comment +---------+------------------+-----+---------+-------+ Brachial 152                    triphasic        +---------+------------------+-----+---------+-------+ PTA      144               0.95 biphasic         +---------+------------------+-----+---------+-------+ DP       148               0.97 biphasic         +---------+------------------+-----+---------+-------+ Great Toe107               0.70 Normal           +---------+------------------+-----+---------+-------+  Right Doppler Findings: +----------+--------+-----+---------+--------+ Site      PressureIndexDoppler  Comments +----------+--------+-----+---------+--------+ [id number]                            +----------+--------+-----+---------+--------+ Brachial  148          triphasic         +----------+--------+-----+---------+--------+ Radial                 triphasic         +----------+--------+-----+---------+--------+ Ulnar                  triphasic         +----------+--------+-----+---------+--------+  Left Doppler Findings: +----------+--------+-----+---------+--------+ Site      PressureIndexDoppler  Comments +----------+--------+-----+---------+--------+ [id number]                            +----------+--------+-----+---------+--------+ Brachial  152          triphasic         +----------+--------+-----+---------+--------+ Radial                 triphasic         +----------+--------+-----+---------+--------+ Ulnar                  triphasic         +----------+--------+-----+---------+--------+  Summary: Right Carotid: The extracranial vessels were near-normal with only minimal wall                thickening or plaque. Left Carotid: The extracranial vessels were near-normal with only minimal wall                thickening or plaque. Vertebrals:  Bilateral vertebral arteries demonstrate antegrade flow. Subclavians: Normal flow hemodynamics were seen in bilateral subclavian              arteries. Right ABI: Resting right ankle-brachial index is within normal range. The right toe-brachial index is normal. Left ABI: Resting left ankle-brachial index is within normal range. The left toe-brachial index is normal. Right Upper Extremity: Doppler waveforms remain within normal limits with right radial compression. Doppler waveforms remain within normal limits with right ulnar compression. Left Upper Extremity: Doppler waveforms remain within normal limits with left radial  compression. Doppler waveform obliterate with left ulnar compression.  Electronically signed by Carolynn Sayers on 12/11/2022 at 4:02:56 PM.    Final (Updating)    CARDIAC CATHETERIZATION  Result Date: 11/28/2022 Conclusions: Severe three-vessel coronary artery disease, as detailed below. Normal left heart filling pressures. Mildly elevated right heart and pulmonary artery pressures. Normal Fick cardiac output/index. Recommendations: Refer to cardiac surgery for CABG evaluation in the setting of three-vessel coronary artery disease and diabetes mellitus. Obtain echocardiogram prior to discharge today. Increase isosorbide mononitrate to 30 mg daily for additional antianginal therapy. Aggressive secondary prevention of coronary artery disease. Yvonne Kendall, MD [facility]  ECHOCARDIOGRAM COMPLETE  Result Date: 11/28/2022    ECHOCARDIOGRAM REPORT   Patient Name:   Erica Estrada Date of Exam: 11/27/2022 Medical Rec #:  [id number]        Height:       65.0 in Accession #:    [id number]       Weight:       162.0 lb Date of Birth:  04/03/1950        BSA:          1.809 m Patient Age:    72 years         BP:           151/80 mmHg Patient Gender: F                HR:           69 bpm. Exam Location:  [facility] Procedure: 2D Echo, Cardiac Doppler and  Color Doppler Indications:     CAD native vessel I25.10  History:         Patient has prior history of Echocardiogram examinations, most                  recent 01/23/2021. Risk Factors:Diabetes and Hypertension.  Sonographer:     Cristela Blue Referring Phys:  [id number] END Diagnosing Phys: Yvonne Kendall MD IMPRESSIONS  1. Left ventricular ejection fraction, by estimation, is 55 to 60%. The left ventricle has normal function. The left ventricle has no regional wall motion abnormalities. Left ventricular diastolic parameters are consistent with Grade I diastolic dysfunction (impaired relaxation).  2. Right ventricular systolic function is normal. The right ventricular size is normal. Tricuspid regurgitation signal is inadequate for assessing PA pressure.  3. The mitral valve is degenerative. Mild mitral valve regurgitation. No evidence of mitral stenosis.  4. The aortic valve is tricuspid. There is mild thickening of the aortic valve. Aortic valve regurgitation is not visualized. Aortic valve sclerosis is present, with no evidence of aortic valve stenosis. FINDINGS  Left Ventricle: Left ventricular ejection fraction, by estimation, is 55 to 60%. The left ventricle has normal function. The left ventricle has no regional wall motion abnormalities. The left ventricular internal cavity size was normal in size. There is  no left ventricular hypertrophy. Left ventricular diastolic parameters are consistent with Grade I diastolic dysfunction (impaired relaxation). Right Ventricle: The right ventricular size is normal. No increase in right ventricular wall thickness. Right ventricular systolic function is normal. Tricuspid regurgitation signal is inadequate for assessing PA pressure. Left Atrium: Left atrial size was normal in size. Right Atrium: Right atrial size was normal in size. Pericardium: The pericardium was not well visualized. Mitral Valve: The mitral valve is degenerative in appearance. There is mild  thickening of the mitral valve leaflet(s). Mild mitral valve regurgitation. No evidence of mitral valve stenosis. MV peak gradient, 5.0 mmHg. The  mean mitral valve gradient is 1.0 mmHg. Tricuspid Valve: The tricuspid valve is normal in structure. Tricuspid valve regurgitation is trivial. Aortic Valve: The aortic valve is tricuspid. There is mild thickening of the aortic valve. There is mild aortic valve annular calcification. Aortic valve regurgitation is not visualized. Aortic valve sclerosis is present, with no evidence of aortic valve  stenosis. Aortic valve mean gradient measures 3.3 mmHg. Aortic valve peak gradient measures 5.8 mmHg. Aortic valve area, by VTI measures 1.87 cm. Pulmonic Valve: The pulmonic valve was normal in structure. Pulmonic valve regurgitation is trivial. Aorta: The aortic root is normal in size and structure. Pulmonary Artery: The pulmonary artery is not well seen. Venous: The inferior vena cava was not well visualized. IAS/Shunts: The interatrial septum was not well visualized.  LEFT VENTRICLE PLAX 2D LVIDd:         3.70 cm   Diastology LVIDs:         2.60 cm   LV e' medial:    6.96 cm/s LV PW:         0.92 cm   LV E/e' medial:  11.5 LV IVS:        1.06 cm   LV e' lateral:   10.00 cm/s LVOT diam:     2.00 cm   LV E/e' lateral: 8.0 LV SV:         49 LV SV Index:   27 LVOT Area:     3.14 cm  RIGHT VENTRICLE RV Basal diam:  2.60 cm RV Mid diam:    2.40 cm RV S prime:     9.79 cm/s TAPSE (M-mode): 2.2 cm LEFT ATRIUM             Index        RIGHT ATRIUM          Index LA diam:        2.60 cm 1.44 cm/m   RA Area:     9.24 cm LA Vol (A2C):   31.9 ml 17.64 ml/m  RA Volume:   17.90 ml 9.90 ml/m LA Vol (A4C):   31.0 ml 17.14 ml/m LA Biplane Vol: 32.5 ml 17.97 ml/m  AORTIC VALVE AV Area (Vmax):    2.02 cm AV Area (Vmean):   1.88 cm AV Area (VTI):     1.87 cm AV Vmax:           120.67 cm/s AV Vmean:          86.367 cm/s AV VTI:            0.264 m AV Peak Grad:      5.8 mmHg AV Mean Grad:       3.3 mmHg LVOT Vmax:         77.40 cm/s LVOT Vmean:        51.800 cm/s LVOT VTI:          0.157 m LVOT/AV VTI ratio: 0.59  AORTA Ao Root diam: 3.00 cm MITRAL VALVE MV Area (PHT): 3.63 cm     SHUNTS MV Area VTI:   1.74 cm     Systemic VTI:  0.16 m MV Peak grad:  5.0 mmHg     Systemic Diam: 2.00 cm MV Mean grad:  1.0 mmHg MV Vmax:       1.12 m/s MV Vmean:      52.3 cm/s MV Decel Time: 209 msec MV E velocity: 80.20 cm/s MV Marzell Isakson velocity: 103.00 cm/s MV E/Shaniyah Wix ratio:  0.78 Cristal Deer End MD  Electronically signed by Yvonne Kendall MD Signature Date/Time: 11/28/2022/5:19:58 AM    Final     Microbiology: No results found for this or any previous visit (from the past 240 hour(s)).   Labs: Basic Metabolic Panel: Recent Labs  Lab 12/24/22 1117 12/25/22 0614 12/26/22 0800  NA 141 140 140  K 3.9 3.4* 3.5  CL 101 104 104  CO2 27 27 26   GLUCOSE 161* 130* 111*  BUN <5* <5* 7*  CREATININE 0.76 0.68 0.68  CALCIUM 9.0 8.8* 8.9  MG 1.8 1.9 2.1  PHOS 3.6 4.0  --    Liver Function Tests: Recent Labs  Lab 12/24/22 1117 12/25/22 0614  AST 27 27  ALT 9 9  ALKPHOS 79 76  BILITOT 0.4 0.4  PROT 7.0 6.4*  ALBUMIN 2.8* 2.5*   No results for input(s): "LIPASE", "AMYLASE" in the last 168 hours. No results for input(s): "AMMONIA" in the last 168 hours. CBC: Recent Labs  Lab 12/24/22 1117 12/25/22 0614 12/26/22 0800  WBC 11.1* 9.4 7.3  HGB 9.1* 8.5* 8.8*  HCT 30.2* 27.2* 28.6*  MCV 90.4 88.6 87.5  PLT 560* 465* 544*   Cardiac Enzymes: Recent Labs  Lab 12/24/22 1117  CKTOTAL 41   BNP: BNP (last 3 results) No results for input(s): "BNP" in the last 8760 hours.  ProBNP (last 3 results) No results for input(s): "PROBNP" in the last 8760 hours.  CBG: Recent Labs  Lab 12/25/22 1145 12/25/22 1551 12/25/22 2125 12/26/22 0730 12/26/22 1141  GLUCAP 130* 125* 157* 82 222*       Signed:  Lacretia Nicks MD.  [facility] 12/26/2022, 11:54 AM

## 2022-12-26 NOTE — Progress Notes (Signed)
Mobility Specialist Progress Note:   12/26/22 1100  Mobility  Activity Ambulated with assistance in hallway  Level of Assistance Contact guard assist, steadying assist  Assistive Device Four wheel walker  Distance Ambulated (ft) 400 ft  RUE Weight Bearing NWB  LUE Weight Bearing NWB  Activity Response Tolerated well  Mobility Referral Yes  $Mobility charge 1 Mobility  Mobility Specialist Start Time (ACUTE ONLY) 1059  Mobility Specialist Stop Time (ACUTE ONLY) 1114  Mobility Specialist Time Calculation (min) (ACUTE ONLY) 15 min    Pre Mobility: 71 HR During Mobility: 85 HR Post Mobility:  81 HR  Pt received in chair, agreeable to mobility. Asymptomatic w/ no complaints throughout. Pt left in chair with call bell and all needs met.  D'Vante Earlene Plater Mobility Specialist Please contact via Special educational needs teacher or Rehab office at [phone]

## 2023-01-01 NOTE — Patient Instructions (Signed)
You are encouraged to enroll and participate in the outpatient cardiac rehab program beginning as soon as practical.   You may return to driving an automobile as long as you are no longer requiring oral narcotic pain relievers during the daytime.  It would be wise to start driving only short distances during the daylight and gradually increase from there as you feel comfortable.   You may return to driving an automobile as long as you are no longer requiring oral narcotic pain relievers during the daytime.  It would be wise to start driving only short distances during the daylight and gradually increase from there as you feel comfortable.  Make every effort to maintain a "heart-healthy" lifestyle with regular physical exercise and adherence to a low-fat, low-carbohydrate diet.  Continue to seek regular follow-up appointments with your primary care physician and/or cardiologist.

## 2023-01-01 NOTE — Progress Notes (Signed)
[address]       [name] [address]             [phone]    HPI: Patient returns for routine postoperative follow-up having undergone CABG x 3 on 12/13/2022. The patient's early postoperative recovery while in the hospital was as expected without complication.  She did get readmitted post operatively for chest discomfort.  This was described as occurring with pulling/use of arms.  She also stated she was not prescribed pain medication at discharge.  However she was provided Norco and this was filled on 10/14 by [facility] in [place] and she received 28 pills.  Workup was negative in the ED and hospital stay.  Since hospital discharge the patient reports she is doing well.  She is accompanied by her daughters who have been really happy with their mothers progress.  She is ambulating without difficulty with assistance of the walker.  Her surgical incisions are healing without evidence of infection.  She does have some sensitivity along her sternotomy incision.  Her weight has remained stable.  She has not been eating much due to issues with diarrhea.  She is eating what she can.   She states her glucose control has been improving.  Current Outpatient Medications  Medication Sig Dispense Refill   ASPERCREME LIDOCAINE 4 % CREA Apply 1 application  topically 2 (two) times daily as needed (pain). Back and knees.     aspirin EC 81 MG tablet Take 81 mg by mouth daily. Swallow whole.     atorvastatin (LIPITOR) 80 MG tablet Take 1 tablet (80 mg total) by mouth daily. 30 tablet 0   calcium carbonate (TUMS - DOSED IN MG ELEMENTAL CALCIUM) 500 MG chewable tablet Chew 2 tablets by mouth 2 (two) times daily as needed for indigestion or heartburn.     camphor-menthol (SARNA) lotion Apply 1 application topically as needed for itching.     Cholecalciferol 25 MCG (1000 UT) capsule Take 1,000 Units by mouth daily.     colchicine 0.6 MG tablet Take 1 tablet (0.6 mg total) by  mouth 2 (two) times daily. Follow up on 10/24 with Charlsie Quest to discuss whether you need to continue this 60 tablet 0   cyclobenzaprine (FLEXERIL) 5 MG tablet Take 5 mg by mouth at bedtime.     ferrous sulfate 325 (65 FE) MG tablet Take 1 tablet (325 mg total) by mouth every other day. 15 tablet 1   folic acid (FOLVITE) 1 MG tablet Take 1 mg by mouth daily.     gabapentin (NEURONTIN) 300 MG capsule Take 300 mg by mouth every evening.     HYDROcodone-acetaminophen (NORCO/VICODIN) 5-325 MG tablet Take 1 tablet by mouth every 6 (six) hours as needed. (Patient not taking: Reported on 12/24/2022) 28 tablet 0   insulin aspart (NOVOLOG FLEXPEN) 100 UNIT/ML FlexPen Inject 8 Units into the skin every evening.     insulin degludec (TRESIBA FLEXTOUCH) 200 UNIT/ML FlexTouch Pen Inject 65 Units into the skin at bedtime.     JARDIANCE 25 MG TABS tablet Take 25 mg by mouth daily.     Lancets (ONETOUCH DELICA PLUS LANCET33G) MISC Apply topically.     metFORMIN (GLUCOPHAGE) 500 MG tablet Take 1,000 mg by mouth 2 (two) times daily with a meal.     methimazole (TAPAZOLE) 5 MG tablet Take 2.5 mg by mouth daily.     metoprolol tartrate (LOPRESSOR) 25 MG tablet Take 1 tablet (25  mg total) by mouth 2 (two) times daily. 60 tablet 1   mometasone (NASONEX) 50 MCG/ACT nasal spray Place 2 sprays into the nose daily as needed.     ONETOUCH ULTRA test strip USE AS DIRECTED FOR BLOOD SUGAR TESTING THREE TIMES DAILY FOR DIABETES E11.9     PARoxetine (PAXIL) 40 MG tablet Take 1 tablet (40 mg total) by mouth every morning. 30 tablet 0   potassium chloride SA (KLOR-CON M) 20 MEQ tablet Take 1 tablet (20 mEq total) by mouth daily. For 4 days then stop. (Patient not taking: Reported on 12/24/2022) 4 tablet 0   SPIRIVA HANDIHALER 18 MCG inhalation capsule Place 1 capsule into inhaler and inhale daily.     traZODone (DESYREL) 50 MG tablet Take 50 mg by mouth at bedtime.     VENTOLIN HFA 108 (90 Base) MCG/ACT inhaler Inhale 2 puffs  into the lungs every 6 (six) hours as needed.     No current facility-administered medications for this visit.    Physical Exam:  BP 125/73   Pulse 90   Resp 20   Ht 5\' 5"  (1.651 m)   Wt 160 lb (72.6 kg)   SpO2 98% Comment: RA  BMI 26.63 kg/m   Gen: NAD Heart: RRR Lungs: CTA bilaterally Ext: no edema Incisions: well healed  Diagnostic Tests:  CXR: sternal wires intact, no pleural effusions  A/P:  S/p CABG x 3 on 12/13/2022 performed by Dr. Laneta Simmers Readmit for pain.. states wasn't provided pain medication, however she was discharged on Norco on 10/7, however this was not filled until 10/14 the day she presented to the Emergency Department per PDMP,  by review of notes, seems like patient was not following sternal precautions which most certainly will increase pain level.  These were discussed with patient.  Her pain has improved and she is using pain medication very infrequently Cardiac Rehab- you are cleared and encouraged to enroll Activity- okay to resume driving if no longer using narcotic pain medication, increase ambulation as tolerated RTC  Lowella Dandy, PA-C [facility] [phone]

## 2023-01-03 ENCOUNTER — Encounter: Payer: Self-pay | Admitting: Cardiology

## 2023-01-03 ENCOUNTER — Ambulatory Visit: Payer: Medicare HMO | Attending: Cardiology | Admitting: Cardiology

## 2023-01-03 VITALS — BP 126/60 | HR 64 | Ht 65.0 in | Wt 161.2 lb

## 2023-01-03 DIAGNOSIS — E118 Type 2 diabetes mellitus with unspecified complications: Secondary | ICD-10-CM

## 2023-01-03 DIAGNOSIS — Z79899 Other long term (current) drug therapy: Secondary | ICD-10-CM

## 2023-01-03 DIAGNOSIS — Z9889 Other specified postprocedural states: Secondary | ICD-10-CM | POA: Diagnosis not present

## 2023-01-03 DIAGNOSIS — E785 Hyperlipidemia, unspecified: Secondary | ICD-10-CM

## 2023-01-03 DIAGNOSIS — I10 Essential (primary) hypertension: Secondary | ICD-10-CM

## 2023-01-03 DIAGNOSIS — Z951 Presence of aortocoronary bypass graft: Secondary | ICD-10-CM

## 2023-01-03 DIAGNOSIS — I2510 Atherosclerotic heart disease of native coronary artery without angina pectoris: Secondary | ICD-10-CM | POA: Diagnosis not present

## 2023-01-03 DIAGNOSIS — Z794 Long term (current) use of insulin: Secondary | ICD-10-CM

## 2023-01-03 DIAGNOSIS — D649 Anemia, unspecified: Secondary | ICD-10-CM

## 2023-01-03 MED ORDER — ACETAMINOPHEN 500 MG PO TABS
500.0000 mg | ORAL_TABLET | Freq: Four times a day (QID) | ORAL | 2 refills | Status: AC | PRN
Start: 2023-01-03 — End: ?

## 2023-01-03 NOTE — Progress Notes (Signed)
Cardiology Office Note:  .   Date:  01/03/2023  ID:  Erica Estrada, DOB [birth year], MRN [id number] PCP: [name], MD  [facility] Providers Cardiologist:  Debbe Odea, MD    History of Present Illness: .   Erica Estrada is a 72 y.o. female with a past medical history of coronary artery disease status post CABG, hypertension, hyperlipidemia, type 2 diabetes, former smoker x 20+ years, who presents today after hospital discharge.  Previous echocardiogram revealed LVEF 60-65%.  Lexiscan Myoview did not show any ischemia.  CT angio of the chest in 11/22 showed three-vessel coronary artery calcifications.  Repeat Lexiscan 09/2022 showed findings consistent with ischemia on the study was an indeterminate risk.   She was last seen in clinic 11/20/2022.  At that time she was discussed the results of her ab normal Myoview.  She did continue to have chest pain that was substernal and associated with rest or exertion.  She did not like to use her Nitrostat because it made her feel bad after she used it.  With her continued complaints of discomfort and abnormal testing she was scheduled for a left heart catheterization.  Left heart catheterization completed 11/27/2022 revealed severe three-vessel coronary artery disease, normal left heart filling pressures, mildly elevated right heart and pulmonary artery pressures with normal Fick cardiac output/index.  She was referred to cardiac surgery for CABG evaluation in the setting of three-vessel coronary artery disease and diabetes.  Her isosorbide mononitrate was increased to 30 mg daily for additional antianginal therapy and continue with aggressive secondary prevention of coronary artery disease. Echocardiogram 11/27/2022 showed ejection fraction 55 to 60%, G1 DD, mild mitral regurgitation.  She was considered stable for discharge and was discharged home on 11/27/2022.  She was to return to [facility] on Thursday 12/13/22 for  surgery.  Patient underwent coronary artery bypass grafting x 3 with LIMA-> LAD, SVG-> PDA, SVG-> diagonal.  Saphenous vein harvest was completed from the right lower extremity.  There was spontaneous return of sinus rhythm after cross-clamp removal with the time of 77 minutes.  The distal and proximal anastomosis were checked for hemostasis.  Position of the graft was satisfactory.  She had 2 temporary epicardial pacing wires that were placed in the right atrium and 2 on the right ventricle.  The patient was weaned from CPB without difficulty on no inotropes.  Cardiac output was 5 L/min.  TEE showed normal LV systolic function.  Heparin was fully reversed with protamine and the aortic and venous cannulas were removed.  Hemostasis was achieved.  Mediastinal and bilateral pleural drainage tubes were placed the sternum was closed in the usual fashion.  She was transported to the surgical intensive care unit in stable condition.  She did not require inotropic/pressor support postoperatively.  She underwent removal of mediastinal chest tubes, arterial line, and Swan on postop day 1.  She was transitioned off of insulin drip to Levemir and sliding scale coverage.  She was on Jardiance, insulin, and metformin prior to surgery.  Metformin was restarted on postop day 4.  Jardiance was needed to be restarted at discharge.  Her preop hemoglobin A1c was 8.2.  Which would require close medical follow-up after discharge.  She remained in sinus rhythm and was stable for transfer to progressive care unit on 12/16/2022.  PT and OT evaluations were obtained and they recommended home health PT and OT.  She needed a lot of encouragement to ambulate.  She had mild volume  overload and was diuresed accordingly.  Epicardial pacing wires were removed on 12/18/2022.  She had increased appetite was tolerated the diet and with administration of laxatives was able to have a bowel movement.  She was able to ambulate on room air with good  oxygenation.  All her wounds were clean, dry, and healing without signs of infection.  Ambulation improved and she was felt stable for discharge on 12/20/22.  She presented back to the [name] emergency department on 12/24/2022 with complaint of chest pain.  She states the pain was worse with any movement and worsened over the weekend where she been having trouble sleeping at night due to pain.  She reported associated shortness of breath.  States that she had been taking her medications as prescribed and several of her medications had been discontinued.  Stated she was not sent home with any pain medication.  Vital signs were stable.  Labs revealed a blood glucose of 161, WBCs of 11.1, hemoglobin of 9.1, hematocrit of 30.2, high-sensitivity troponin of 45 and 46, she was treated with morphine 4 mg IVP and recommended for admission with cardiology follow-up.  With her chest pain there was concern for possible pericarditis and she was treated with colchicine and ibuprofen.  Repeat echocardiogram revealed LVEF of 55 to 60%, G1 DD, some regional wall motion abnormalities were noted suggestive of postoperative septal changes.  Without concerns for valvular abnormalities.  Sed rate was elevated at 92.  She had noted improvement with NSAIDs and anti-inflammatories and was able to be discharged on 12/26/2022.  She returns to clinic today accompanied by her husband stating that overall she is feeling much better.  Since being started on colchicine she has been able to sleep back in the bed.  She denies any chest pain today she still has some tenderness related to her midsternal incision.  Denies any shortness of breath.  States her appetite is slowly been improving.  She has noted some occasional swelling to her bilateral lower extremities when she leaves them dependent during the day.  She is awaiting her upcoming appointment with [facility] for follow-up x-ray and released to cardiac rehab.  She and her husband both had  several questions today related to her activity.  ROS: 10 point review of systems has been reviewed and considered negative with exception was been listed in the HPI  Studies Reviewed: Marland Kitchen   EKG Interpretation Date/Time:  Thursday January 03 2023 09:53:49 EDT Ventricular Rate:  64 PR Interval:  154 QRS Duration:  80 QT Interval:  422 QTC Calculation: 435 R Axis:   42  Text Interpretation: Normal sinus rhythm T wave abnormality, consider inferolateral ischemia When compared with ECG of 25-Dec-2022 05:07, T wave inversion less evident in Anterior leads QT has shortened Confirmed by Charlsie Quest ([id number]) on 01/03/2023 9:56:29 AM   TTE 12/25/22 1. Left ventricular ejection fraction, by estimation, is 55 to 60%. The  left ventricle has normal function. The left ventricle demonstrates  regional wall motion abnormalities (see scoring diagram/findings for  description). Left ventricular diastolic  parameters are consistent with Grade I diastolic dysfunction (impaired  relaxation). Wall motion suggestive of post-operative septal changes.   2. Right ventricular systolic function is normal. The right ventricular  size is normal. There is normal pulmonary artery systolic pressure.   3. The mitral valve is normal in structure. No evidence of mitral valve  regurgitation. No evidence of mitral stenosis.   4. The aortic valve is tricuspid. There is mild calcification  of the  aortic valve. There is mild thickening of the aortic valve. Aortic valve  regurgitation is trivial. Aortic valve sclerosis is present, with no  evidence of aortic valve stenosis.   5. Cannot exclude a small PFO.   TEE Intraoperative 12/13/22 _ Left Ventricle: The left ventricle is unchanged from pre-bypass. has  normal  systolic function, with an ejection fraction of 60%. The cavity size was  normal.  The wall motion is normal.  _ Right Ventricle: The right ventricle appears unchanged from pre-bypass  normal  function.  _  Aorta: The aorta appears unchanged from pre-bypass.  _ Left Atrial Appendage: The left atrial appendage appears unchanged from  pre-bypass.  _ Aortic Valve: The aortic valve appears unchanged from pre-bypass. No  stenosis  present. There is no regurgitation.  _ Mitral Valve: There is trivial-mild regurgitation.  _ Tricuspid Valve: There is mild regurgitation.  _ Pulmonic Valve: The pulmonic valve appears unchanged from pre-bypass.  _ Interatrial Septum: The interatrial septum appears unchanged from  pre-bypass.  _ Pericardium: The pericardium appears unchanged from pre-bypass.  _ Comments: Post-bypass images reviewed with surgeon.   TTE 11/27/22 1. Left ventricular ejection fraction, by estimation, is 55 to 60%. The  left ventricle has normal function. The left ventricle has no regional  wall motion abnormalities. Left ventricular diastolic parameters are  consistent with Grade I diastolic  dysfunction (impaired relaxation).   2. Right ventricular systolic function is normal. The right ventricular  size is normal. Tricuspid regurgitation signal is inadequate for assessing  PA pressure.   3. The mitral valve is degenerative. Mild mitral valve regurgitation. No  evidence of mitral stenosis.   4. The aortic valve is tricuspid. There is mild thickening of the aortic  valve. Aortic valve regurgitation is not visualized. Aortic valve  sclerosis is present, with no evidence of aortic valve stenosis.   R/LHC 11/27/22 Conclusions: Severe three-vessel coronary artery disease, as detailed below. Normal left heart filling pressures. Mildly elevated right heart and pulmonary artery pressures. Normal Fick cardiac output/index.   Recommendations: Refer to cardiac surgery for CABG evaluation in the setting of three-vessel coronary artery disease and diabetes mellitus. Obtain echocardiogram prior to discharge today. Increase isosorbide mononitrate to 30 mg daily for additional antianginal  therapy. Aggressive secondary prevention of coronary artery disease. Lexiscan MPI 10/09/22   Findings are consistent with ischemia. The study is intermediate risk.   No ST deviation was noted.   LV perfusion is abnormal. There is evidence of ischemia. There is evidence of infarction. Defect 1: There is a medium defect with moderate reduction in uptake present in the mid to basal anterior location(s) that is partially reversible. There is normal wall motion in the defect area. Consistent with ischemia.   Left ventricular function is normal. End diastolic cavity size is normal. End systolic cavity size is normal.   Unfortunately, this is a very suboptimal study due to intense GI uptake at the border of the inferior wall.  The anterior wall reversible defect might be due to artifact.   CT attenuation images show evidence of aortic and coronary calcifications.   12/2/2022Eugenie Birks MPI Pharmacological myocardial perfusion imaging study with no significant  ischemia Normal wall motion, EF estimated at 68% No EKG changes concerning for ischemia at peak stress or in recovery. CT attenuation correction images with mild aortic atherosclerosis, mild coronary calcification Low risk scan   01/22/21-TTE 1. Left ventricular ejection fraction, by estimation, is 60 to 65%. The  left ventricle  has normal function. The left ventricle has no regional  wall motion abnormalities. Left ventricular diastolic parameters were  normal.   2. Right ventricular systolic function is normal. The right ventricular  size is normal.   3. The mitral valve is normal in structure. Trivial mitral valve  regurgitation.   4. The aortic valve is normal in structure. Aortic valve regurgitation is  not visualized.  Risk Assessment/Calculations:             Physical Exam:   VS:  BP 126/60 (BP Location: Right Arm, Patient Position: Sitting, Cuff Size: Normal)   Pulse 64   Ht 5\' 5"  (1.651 m)   Wt 161 lb 4 oz (73.1 kg)   SpO2  96%   BMI 26.83 kg/m    Wt Readings from Last 3 Encounters:  01/03/23 161 lb 4 oz (73.1 kg)  12/25/22 160 lb 9.6 oz (72.8 kg)  12/20/22 164 lb 0.4 oz (74.4 kg)    GEN: Well nourished, well developed in no acute distress NECK: No JVD; No carotid bruits CARDIAC: RRR, no murmurs, rubs, gallops, midsternal incision healing well, chest tube removal sites healing well.  Patient still notes some mild tenderness to area approximated edges with no drainage. RESPIRATORY:  Clear to auscultation without rales, wheezing or rhonchi  ABDOMEN: Soft, non-tender, non-distended EXTREMITIES:  No edema; No deformity, SVG harvest sites to right leg with approximated edges healing well.  ASSESSMENT AND PLAN: .   Coronary artery disease status post CABG x 3 on 12/13/2022.  Patient had readmission for chest pain thought to be pericarditis with echocardiogram completed during recent hospitalization without pericardial effusion noted.  With vast improvement in symptoms she will be continued on colchicine 0.1 mg twice daily for 3 months for full benefit of therapy.  She is continued on aspirin 81 mg atorvastatin 80 mg daily and metoprolol to tartrate 25 mg twice daily.  EKG today reveals sinus rhythm with previously noted inferior lateral T wave inversions without any acute change.  Patient has upcoming follow-up with [facility] for chest x-ray and after follow-up will be referred to cardiac rehab.  Primary hypertension blood pressure today 126/60.  Blood pressure remained stable.  She is continued on metoprolol titrate 25 mg twice daily.  She is also encouraged to continue to manage her blood pressure 1 to 2 hours postmedication administration.  Previous medications of amlodipine and Imdur were discontinued on hospital discharge.  Mixed hyperlipidemia with an LDL of 47 which remains at goal on 12/14/2022.  She is continued on atorvastatin 80 mg daily.  Type 2 diabetes with a hemoglobin A1c of 7.8.  She is continued on Jardiance,  insulin, Tresiba, and metformin.  This continues to be managed by her PCP.  Anemia with last hemoglobin of 8.8.  Patient denies any bleeding with any blood noted in her urine or stool.  She has been sent for CBC today.  She is also continued on oral iron supplements.    Cardiac Rehabilitation Eligibility Assessment         Dispo: Patient to return to clinic to see MD/APP in 2 months or sooner if needed for reevaluation of symptoms  Signed, Tremeka Helbling, NP

## 2023-01-03 NOTE — Patient Instructions (Addendum)
Medication Instructions:  Your physician recommends the following medication changes.   START TAKING: Tylenol 500 mg - take 1 or 2 tablets every 6 hours as needed for pain - maximum dose of 8 tablets in a 24 hour period.  DO NOT take this medicine with your NORCO/VICODIN because that medicine contains Tylenol (Acetaminophen).   *If you need a refill on your cardiac medications before your next appointment, please call your pharmacy*   Lab Work: Your provider would like for you to have following labs drawn today CBC and BMP.   If you have labs (blood work) drawn today and your tests are completely normal, you will receive your results only by: MyChart Message (if you have MyChart) OR A paper copy in the mail If you have any lab test that is abnormal or we need to change your treatment, we will call you to review the results.    Follow-Up: At [facility], you and your health needs are our priority.  As part of our continuing mission to provide you with exceptional heart care, we have created designated Provider Care Teams.  These Care Teams include your primary Cardiologist (physician) and Advanced Practice Providers (APPs -  Physician Assistants and Nurse Practitioners) who all work together to provide you with the care you need, when you need it.  We recommend signing up for the patient portal called "MyChart".  Sign up information is provided on this After Visit Summary.  MyChart is used to connect with patients for Virtual Visits (Telemedicine).  Patients are able to view lab/test results, encounter notes, upcoming appointments, etc.  Non-urgent messages can be sent to your provider as well.   To learn more about what you can do with MyChart, go to [email or web address].    Your next appointment:   2 month(s)  Provider:   You may see Charlsie Quest, NP

## 2023-01-04 LAB — BASIC METABOLIC PANEL
BUN/Creatinine Ratio: 15 (ref 12–28)
BUN: 11 mg/dL (ref 8–27)
CO2: 20 mmol/L (ref 20–29)
Calcium: 9.1 mg/dL (ref 8.7–10.3)
Chloride: 105 mmol/L (ref 96–106)
Creatinine, Ser: 0.73 mg/dL (ref 0.57–1.00)
Glucose: 123 mg/dL — ABNORMAL HIGH (ref 70–99)
Potassium: 4.7 mmol/L (ref 3.5–5.2)
Sodium: 140 mmol/L (ref 134–144)
eGFR: 86 mL/min/{1.73_m2} (ref >59)

## 2023-01-04 LAB — CBC
Hematocrit: 31.6 % — ABNORMAL LOW (ref 34.0–46.6)
Hemoglobin: 9.9 g/dL — ABNORMAL LOW (ref 11.1–15.9)
MCH: 27.6 pg (ref 26.6–33.0)
MCHC: 31.3 g/dL — ABNORMAL LOW (ref 31.5–35.7)
MCV: 88 fL (ref 79–97)
Platelets: 446 10*3/uL (ref 150–450)
RBC: 3.59 x10E6/uL — ABNORMAL LOW (ref 3.77–5.28)
RDW: 13.2 % (ref 11.7–15.4)
WBC: 8.2 10*3/uL (ref 3.4–10.8)

## 2023-01-04 NOTE — Progress Notes (Signed)
Hemoglobin has improved since hospital discharge.  Kidney function is stable.  Potassium remains stable.  Continue with current medication regimen with no changes needed at this time.

## 2023-01-07 ENCOUNTER — Ambulatory Visit: Payer: Medicare HMO | Admitting: Podiatry

## 2023-01-07 ENCOUNTER — Encounter: Payer: Self-pay | Admitting: Podiatry

## 2023-01-07 VITALS — Ht 65.0 in | Wt 161.2 lb

## 2023-01-07 DIAGNOSIS — E1142 Type 2 diabetes mellitus with diabetic polyneuropathy: Secondary | ICD-10-CM

## 2023-01-07 DIAGNOSIS — B351 Tinea unguium: Secondary | ICD-10-CM

## 2023-01-07 DIAGNOSIS — M79675 Pain in left toe(s): Secondary | ICD-10-CM

## 2023-01-07 DIAGNOSIS — M79674 Pain in right toe(s): Secondary | ICD-10-CM | POA: Diagnosis not present

## 2023-01-07 NOTE — Progress Notes (Signed)
  Subjective:  Patient ID: Erica Estrada, female    DOB: 1950/08/10,  MRN: [id number]  72 y.o. female presents at risk foot care with history of diabetic neuropathy and painful elongated mycotic toenails 1-5 bilaterally which are tender when wearing enclosed shoe gear. Pain is relieved with periodic professional debridement. Patient states she is recovering from open heart surgery. Chief Complaint  Patient presents with   Nail Problem    DFC, Pt is a diabetic, last A1C was 8, Last office visit was 2 weeks ago, PCP is [name].   New problem(s): None   PCP is [name], MD.  Allergies  Allergen Reactions   Ace Inhibitors Cough    Chronic cough for 3 months. Trial off lisinopril starting 05/11/2011 effective in stopping cough.   Fluticasone Other (See Comments)    choking    Review of Systems: Negative except as noted in the HPI.   Objective:  Erica Estrada is a pleasant 72 y.o. female WD, WN in NAD.Marland Kitchen AAO x 3.  Vascular Examination: Vascular status intact b/l with palpable pedal pulses. CFT immediate b/l. Pedal hair present. No edema. No pain with calf compression b/l. Skin temperature gradient WNL b/l. No varicosities noted. No cyanosis or clubbing noted.  Neurological Examination: Protective sensation diminished with 10g monofilament b/l.  Dermatological Examination: Pedal skin with normal turgor, texture and tone b/l. No open wounds nor interdigital macerations noted. Toenails 1-5 b/l thick, discolored, elongated with subungual debris and pain on dorsal palpation. No hyperkeratotic lesions noted b/l.   Musculoskeletal Examination: Muscle strength 5/5 to b/l LE.  No pain, crepitus noted b/l. No gross pedal deformities. Utilizes rollator for ambulation assistance.  Radiographs: None  Last A1c:      Latest Ref Rng & Units 12/24/2022   11:17 AM 12/11/2022    3:12 PM  Hemoglobin A1C  Hemoglobin-A1c 4.8 - 5.6 % 7.8  8.2     Assessment:   1. Pain due to  onychomycosis of toenails of both feet   2. Diabetic peripheral neuropathy associated with type 2 diabetes mellitus (HCC)    Plan:  -Patient was evaluated today. All questions/concerns addressed on today's visit. -Continue foot and shoe inspections daily. Monitor blood glucose per PCP/Endocrinologist's recommendations. -Patient to continue soft, supportive shoe gear daily. -Toenails 1-5 b/l were debrided in length and girth with sterile nail nippers and dremel without iatrogenic bleeding.  -Patient/POA to call should there be question/concern in the interim.  Return in about 3 months (around 04/09/2023).  Freddie Breech, DPM

## 2023-01-08 ENCOUNTER — Other Ambulatory Visit: Payer: Self-pay | Admitting: Surgery

## 2023-01-08 DIAGNOSIS — I2510 Atherosclerotic heart disease of native coronary artery without angina pectoris: Secondary | ICD-10-CM

## 2023-01-09 ENCOUNTER — Ambulatory Visit (INDEPENDENT_AMBULATORY_CARE_PROVIDER_SITE_OTHER): Payer: Self-pay | Admitting: Physician Assistant

## 2023-01-09 ENCOUNTER — Ambulatory Visit
Admission: RE | Admit: 2023-01-09 | Discharge: 2023-01-09 | Disposition: A | Discharge status: Home / Self Care | Payer: Medicare HMO | Source: Ambulatory Visit | Attending: Surgery | Admitting: Surgery

## 2023-01-09 VITALS — BP 125/73 | HR 90 | Resp 20 | Ht 65.0 in | Wt 160.0 lb

## 2023-01-09 DIAGNOSIS — Z951 Presence of aortocoronary bypass graft: Secondary | ICD-10-CM

## 2023-01-09 DIAGNOSIS — I2510 Atherosclerotic heart disease of native coronary artery without angina pectoris: Secondary | ICD-10-CM

## 2023-03-10 ENCOUNTER — Emergency Department: Payer: Medicare HMO

## 2023-03-10 ENCOUNTER — Encounter: Payer: Self-pay | Admitting: *Deleted

## 2023-03-10 DIAGNOSIS — Z5321 Procedure and treatment not carried out due to patient leaving prior to being seen by health care provider: Secondary | ICD-10-CM | POA: Insufficient documentation

## 2023-03-10 DIAGNOSIS — R519 Headache, unspecified: Secondary | ICD-10-CM | POA: Diagnosis present

## 2023-03-10 LAB — CBC
HCT: 36.6 % (ref 36.0–46.0)
Hemoglobin: 11.5 g/dL — ABNORMAL LOW (ref 12.0–15.0)
MCH: 27.1 pg (ref 26.0–34.0)
MCHC: 31.4 g/dL (ref 30.0–36.0)
MCV: 86.1 fL (ref 80.0–100.0)
Platelets: 291 10*3/uL (ref 150–400)
RBC: 4.25 MIL/uL (ref 3.87–5.11)
RDW: 14.4 % (ref 11.5–15.5)
WBC: 10.6 10*3/uL — ABNORMAL HIGH (ref 4.0–10.5)
nRBC: 0 % (ref 0.0–0.2)

## 2023-03-10 LAB — BASIC METABOLIC PANEL
Anion gap: 11 (ref 5–15)
BUN: 14 mg/dL (ref 8–23)
CO2: 22 mmol/L (ref 22–32)
Calcium: 9.1 mg/dL (ref 8.9–10.3)
Chloride: 107 mmol/L (ref 98–111)
Creatinine, Ser: 0.77 mg/dL (ref 0.44–1.00)
GFR, Estimated: >60 mL/min (ref >60)
Glucose, Bld: 225 mg/dL — ABNORMAL HIGH (ref 70–99)
Potassium: 3.6 mmol/L (ref 3.5–5.1)
Sodium: 140 mmol/L (ref 135–145)

## 2023-03-10 NOTE — ED Triage Notes (Signed)
Pt ambulatory to triage.  Pt has a headache for 4 days.  No n/v/ pt taking advil without relief.  Pt reports pain on left side of head.  Pt alert  speech clear.

## 2023-03-11 ENCOUNTER — Ambulatory Visit: Payer: Self-pay | Admitting: *Deleted

## 2023-03-11 ENCOUNTER — Emergency Department
Admission: EM | Admit: 2023-03-11 | Discharge: 2023-03-11 | Discharge status: Against Medical Advice | Payer: Medicare HMO | Attending: Emergency Medicine | Admitting: Emergency Medicine

## 2023-03-11 MED ORDER — ACETAMINOPHEN 325 MG PO TABS
650.0000 mg | ORAL_TABLET | Freq: Once | ORAL | Status: DC
Start: 2023-03-11 — End: 2023-03-11
  Filled 2023-03-11: qty 2

## 2023-03-11 NOTE — ED Notes (Signed)
Called 3 times no answer. 

## 2023-03-11 NOTE — Telephone Encounter (Signed)
  Chief Complaint: Referred to her PCP to go over her CT scan results since she left the ED without being seen by a provider after they did the scan.   Symptoms: Headache Frequency: Not asked Pertinent Negatives: Patient denies N/A Disposition: [] ED /[] Urgent Care (no appt availability in office) / [] Appointment(In office/virtual)/ []  [facility]/ [] Home Care/ [] Refused Recommended Disposition /[] [place] [facility]/ [x]  Follow-up with PCP Additional Notes: Follow up with PCP during appt tomorrow 03/12/2023 regarding CT scan results.

## 2023-03-11 NOTE — Telephone Encounter (Signed)
Reason for Disposition  [1] Caller requesting NON-URGENT health information AND [2] PCP's office is the best resource    She has an appt with PCP Dr. Darreld Mclean tomorrow 03/12/2023.  Answer Assessment - Initial Assessment Questions 1. REASON FOR CALL or QUESTION: "What is your reason for calling today?" or "How can I best help you?" or "What question do you have that I can help answer?"     I went to the ED last night but I left before being seen.  I had a headache.    They did a CT scan.   I was calling back for the results. I let her know she would need to return to the ED in order for a provider to review the CT scan results with her.    I asked if she had a PCP.   She does and has an appt with her tomorrow 03/12/2023.   Dr. Darreld Mclean.    I let pt know Dr. Marvis Moeller can review her CT results with her tomorrow during her appt.  "I had a headache like this years ago and they gave me something for it and it went away".   "I didn't know if they could call me in something for the headache for today".    I let her know they could not do that from the ED.   She would need to be seen in the ED or talk with her PCP tomorrow.     Pt. Was agreeable to this plan and thanked me for my help,  Protocols used: Information Only Call - No Triage-A-AH

## 2023-03-14 ENCOUNTER — Ambulatory Visit: Payer: Medicare HMO | Attending: Cardiology | Admitting: Cardiology

## 2023-03-14 NOTE — Progress Notes (Deleted)
 Cardiology Office Note:  .   Date:  03/14/2023  ID:  Erica Estrada, DOB [birth year], MRN [id number] PCP: [place] HERO, MD  [facility] Providers Cardiologist:  Redell Cave, MD { Click to update primary MD,subspecialty MD or APP then REFRESH:1}   History of Present Illness: .   Erica Estrada is a 73 y.o. female with a past medical history of coronary artery disease status post CABG x 3 (12/13/22), hypertension, vomiting, type 2 diabetes, former smoker x 20+ years, who presents today for follow-up.   Previous echocardiogram revealed LVEF 60-65%.  Lexiscan  Myoview  did not show any ischemia.  CT angio of the chest in 11/22 showed three-vessel coronary artery calcifications.  Repeat Lexiscan  09/2022 showed findings consistent with ischemia on the study was an indeterminate risk. Left heart catheterization completed 11/27/2022 revealed severe three-vessel coronary artery disease, normal left heart filling pressures, mildly elevated right heart and pulmonary artery pressures with normal Fick cardiac output/index.  She was referred to cardiac surgery for CABG evaluation in the setting of three-vessel coronary artery disease and diabetes.  Her isosorbide  mononitrate was increased to 30 mg daily for additional antianginal therapy and continue with aggressive secondary prevention of coronary artery disease. Echocardiogram 11/27/2022 showed ejection fraction 55 to 60%, G1 DD, mild mitral regurgitation.  She was considered stable for discharge and was discharged home on 11/27/2022.  She was to return to [facility] on Thursday 12/13/22 for surgery.  Patient underwent coronary artery bypass grafting x 3 with LIMA-> LAD, SVG-> PDA, SVG-> diagonal.  Saphenous vein harvest was completed from the right lower extremity.  There was spontaneous return of sinus rhythm after cross-clamp removal with the time of 77 minutes.  The distal and proximal anastomosis were checked for hemostasis.   Position of the graft was satisfactory.  She had 2 temporary epicardial pacing wires that were placed in the right atrium and 2 on the right ventricle.  The patient was weaned from CPB without difficulty on no inotropes.  Cardiac output was 5 L/min.  TEE showed normal LV systolic function.  Heparin  was fully reversed with protamine  and the aortic and venous cannulas were removed.  Hemostasis was achieved.  Mediastinal and bilateral pleural drainage tubes were placed the sternum was closed in the usual fashion.  She was transported to the surgical intensive care unit in stable condition.  She did not require inotropic/pressor support postoperatively.  She underwent removal of mediastinal chest tubes, arterial line, and Swan on postop day 1.  She was transitioned off of insulin  drip to Levemir  and sliding scale coverage.  She was on Jardiance, insulin , and metformin  prior to surgery.  Metformin  was restarted on postop day 4.  Jardiance was needed to be restarted at discharge.  Her preop hemoglobin A1c was 8.2.  Which would require close medical follow-up after discharge.  She remained in sinus rhythm and was stable for transfer to progressive care unit on 12/16/2022.  PT and OT evaluations were obtained and they recommended home health PT and OT.  She needed a lot of encouragement to ambulate.  She had mild volume overload and was diuresed accordingly.  Epicardial pacing wires were removed on 12/18/2022.  She had increased appetite was tolerated the diet and with administration of laxatives was able to have a bowel movement.  She was able to ambulate on room air with good oxygenation.  All her wounds were clean, dry, and healing without signs of infection.  Ambulation improved and she was  felt stable for discharge on 12/20/22.she presented back to [facility] emergency department 12/24/2010 with complaint of chest pain.  High-sensitivity troponin 45 and 46.  There was concern for pericarditis and she was treated with  colchicine  and ibuprofen  repeat echocardiogram revealed LVEF of 55 to 60%, G1 DD, some regional wall motion abnormalities were noted suggestive of postoperative septal changes. Without concerns for valvular abnormalities. Sed rate was elevated at 92. She had noted improvement with NSAIDs and anti-inflammatories and was able to be discharged on 12/26/2022.   She was last seen in clinic 12/30/2022 was feeling better.  She did notice some occasional swelling to bilateral lower extremities.  She does have upcoming appointment with [facility] for follow-up chest x-ray and to be released to cardiac rehab.  She and her husband both had several questions about increasing her activity.  There were no medication changes that were made and no further testing that was ordered at that time.  She returns to clinic today  ROS: 10 point review of systems has been reviewed and considered negative with exception what is been listed in the HPI  Studies Reviewed: SABRA        TTE 12/25/22 1. Left ventricular ejection fraction, by estimation, is 55 to 60%. The  left ventricle has normal function. The left ventricle demonstrates  regional wall motion abnormalities (see scoring diagram/findings for  description). Left ventricular diastolic  parameters are consistent with Grade I diastolic dysfunction (impaired  relaxation). Wall motion suggestive of post-operative septal changes.   2. Right ventricular systolic function is normal. The right ventricular  size is normal. There is normal pulmonary artery systolic pressure.   3. The mitral valve is normal in structure. No evidence of mitral valve  regurgitation. No evidence of mitral stenosis.   4. The aortic valve is tricuspid. There is mild calcification of the  aortic valve. There is mild thickening of the aortic valve. Aortic valve  regurgitation is trivial. Aortic valve sclerosis is present, with no  evidence of aortic valve stenosis.   5. Cannot exclude a small PFO.     TEE Intraoperative 12/13/22 _ Left Ventricle: The left ventricle is unchanged from pre-bypass. has  normal  systolic function, with an ejection fraction of 60%. The cavity size was  normal.  The wall motion is normal.  _ Right Ventricle: The right ventricle appears unchanged from pre-bypass  normal  function.  _ Aorta: The aorta appears unchanged from pre-bypass.  _ Left Atrial Appendage: The left atrial appendage appears unchanged from  pre-bypass.  _ Aortic Valve: The aortic valve appears unchanged from pre-bypass. No  stenosis  present. There is no regurgitation.  _ Mitral Valve: There is trivial-mild regurgitation.  _ Tricuspid Valve: There is mild regurgitation.  _ Pulmonic Valve: The pulmonic valve appears unchanged from pre-bypass.  _ Interatrial Septum: The interatrial septum appears unchanged from  pre-bypass.  _ Pericardium: The pericardium appears unchanged from pre-bypass.  _ Comments: Post-bypass images reviewed with surgeon.    TTE 11/27/22 1. Left ventricular ejection fraction, by estimation, is 55 to 60%. The  left ventricle has normal function. The left ventricle has no regional  wall motion abnormalities. Left ventricular diastolic parameters are  consistent with Grade I diastolic  dysfunction (impaired relaxation).   2. Right ventricular systolic function is normal. The right ventricular  size is normal. Tricuspid regurgitation signal is inadequate for assessing  PA pressure.   3. The mitral valve is degenerative. Mild mitral valve regurgitation. No  evidence  of mitral stenosis.   4. The aortic valve is tricuspid. There is mild thickening of the aortic  valve. Aortic valve regurgitation is not visualized. Aortic valve  sclerosis is present, with no evidence of aortic valve stenosis.    R/LHC 11/27/22 Conclusions: Severe three-vessel coronary artery disease, as detailed below. Normal left heart filling pressures. Mildly elevated right heart and pulmonary  artery pressures. Normal Fick cardiac output/index.   Recommendations: Refer to cardiac surgery for CABG evaluation in the setting of three-vessel coronary artery disease and diabetes mellitus. Obtain echocardiogram prior to discharge today. Increase isosorbide  mononitrate to 30 mg daily for additional antianginal therapy. Aggressive secondary prevention of coronary artery disease. Lexiscan  MPI 10/09/22   Findings are consistent with ischemia. The study is intermediate risk.   No ST deviation was noted.   LV perfusion is abnormal. There is evidence of ischemia. There is evidence of infarction. Defect 1: There is a medium defect with moderate reduction in uptake present in the mid to basal anterior location(s) that is partially reversible. There is normal wall motion in the defect area. Consistent with ischemia.   Left ventricular function is normal. End diastolic cavity size is normal. End systolic cavity size is normal.   Unfortunately, this is a very suboptimal study due to intense GI uptake at the border of the inferior wall.  The anterior wall reversible defect might be due to artifact.   CT attenuation images show evidence of aortic and coronary calcifications.   02/10/2021- Lexiscan  MPI Pharmacological myocardial perfusion imaging study with no significant  ischemia Normal wall motion, EF estimated at 68% No EKG changes concerning for ischemia at peak stress or in recovery. CT attenuation correction images with mild aortic atherosclerosis, mild coronary calcification Low risk scan   01/22/21-TTE 1. Left ventricular ejection fraction, by estimation, is 60 to 65%. The  left ventricle has normal function. The left ventricle has no regional  wall motion abnormalities. Left ventricular diastolic parameters were  normal.   2. Right ventricular systolic function is normal. The right ventricular  size is normal.   3. The mitral valve is normal in structure. Trivial mitral valve   regurgitation.   4. The aortic valve is normal in structure. Aortic valve regurgitation is  not visualized.  Risk Assessment/Calculations:     No BP recorded.  {Refresh Note OR Click here to enter BP  :1}***       Physical Exam:   VS:  There were no vitals taken for this visit.   Wt Readings from Last 3 Encounters:  03/10/23 155 lb (70.3 kg)  01/09/23 160 lb (72.6 kg)  01/07/23 161 lb 4 oz (73.1 kg)    GEN: Well nourished, well developed in no acute distress NECK: No JVD; No carotid bruits CARDIAC: ***RRR, no murmurs, rubs, gallops RESPIRATORY:  Clear to auscultation without rales, wheezing or rhonchi  ABDOMEN: Soft, non-tender, non-distended EXTREMITIES:  No edema; No deformity   ASSESSMENT AND PLAN: .   *** {The patient has an active order for outpatient cardiac rehabilitation.   Please indicate if the patient is ready to start. Do NOT delete this.  It will auto delete.  Refresh note, then sign.              Click here to document readiness and see contraindications.  :1}  Cardiac Rehabilitation Eligibility Assessment      {Are you ordering a CV Procedure (e.g. stress test, cath, DCCV, TEE, etc)?   Press F2        :  [id number]}  Dispo: ***  Signed, Betha Shadix, NP

## 2023-03-29 ENCOUNTER — Ambulatory Visit: Payer: 59 | Attending: Cardiology | Admitting: Cardiology

## 2023-03-29 NOTE — Progress Notes (Deleted)
Cardiology Office Note:  .   Date:  03/29/2023  ID:  Erica Estrada, DOB 25-Jul-1950, MRN [id number] PCP: [name], MD  [facility] Providers Cardiologist:  Debbe Odea, MD { Click to update primary MD,subspecialty MD or APP then REFRESH:1}   History of Present Illness: .   Erica Estrada is a 73 y.o. female with a past medical history of coronary artery disease status post CABG x 3 (12/13/22), hypertension, vomiting, type 2 diabetes, former smoker x 20+ years, who presents today for follow-up.   Previous echocardiogram revealed LVEF 60-65%.  Lexiscan Myoview did not show any ischemia.  CT angio of the chest in 11/22 showed three-vessel coronary artery calcifications.  Repeat Lexiscan 09/2022 showed findings consistent with ischemia on the study was an indeterminate risk. Left heart catheterization completed 11/27/2022 revealed severe three-vessel coronary artery disease, normal left heart filling pressures, mildly elevated right heart and pulmonary artery pressures with normal Fick cardiac output/index.  She was referred to cardiac surgery for CABG evaluation in the setting of three-vessel coronary artery disease and diabetes.  Her isosorbide mononitrate was increased to 30 mg daily for additional antianginal therapy and continue with aggressive secondary prevention of coronary artery disease. Echocardiogram 11/27/2022 showed ejection fraction 55 to 60%, G1 DD, mild mitral regurgitation.  She was considered stable for discharge and was discharged home on 11/27/2022.  She was to return to [facility] on Thursday 12/13/22 for surgery.  Patient underwent coronary artery bypass grafting x 3 with LIMA-> LAD, SVG-> PDA, SVG-> diagonal.  Saphenous vein harvest was completed from the right lower extremity.  There was spontaneous return of sinus rhythm after cross-clamp removal with the time of 77 minutes.  The distal and proximal anastomosis were checked for hemostasis.   Position of the graft was satisfactory.  She had 2 temporary epicardial pacing wires that were placed in the right atrium and 2 on the right ventricle.  The patient was weaned from CPB without difficulty on no inotropes.  Cardiac output was 5 L/min.  TEE showed normal LV systolic function.  Heparin was fully reversed with protamine and the aortic and venous cannulas were removed.  Hemostasis was achieved.  Mediastinal and bilateral pleural drainage tubes were placed the sternum was closed in the usual fashion.  She was transported to the surgical intensive care unit in stable condition.  She did not require inotropic/pressor support postoperatively.  She underwent removal of mediastinal chest tubes, arterial line, and Swan on postop day 1.  She was transitioned off of insulin drip to Levemir and sliding scale coverage.  She was on Jardiance, insulin, and metformin prior to surgery.  Metformin was restarted on postop day 4.  Jardiance was needed to be restarted at discharge.  Her preop hemoglobin A1c was 8.2.  Which would require close medical follow-up after discharge.  She remained in sinus rhythm and was stable for transfer to progressive care unit on 12/16/2022.  PT and OT evaluations were obtained and they recommended home health PT and OT.  She needed a lot of encouragement to ambulate.  She had mild volume overload and was diuresed accordingly.  Epicardial pacing wires were removed on 12/18/2022.  She had increased appetite was tolerated the diet and with administration of laxatives was able to have a bowel movement.  She was able to ambulate on room air with good oxygenation.  All her wounds were clean, dry, and healing without signs of infection.  Ambulation improved and she was  felt stable for discharge on 12/20/22.she presented back to [facility] emergency department 12/24/2010 with complaint of chest pain.  High-sensitivity troponin 45 and 46.  There was concern for pericarditis and she was treated with  colchicine and ibuprofen repeat echocardiogram revealed LVEF of 55 to 60%, G1 DD, some regional wall motion abnormalities were noted suggestive of postoperative septal changes. Without concerns for valvular abnormalities. Sed rate was elevated at 92. She had noted improvement with NSAIDs and anti-inflammatories and was able to be discharged on 12/26/2022.   She was last seen in clinic 12/30/2022 was feeling better.  She did notice some occasional swelling to bilateral lower extremities.  She does have upcoming appointment with [facility] for follow-up chest x-ray and to be released to cardiac rehab.  She and her husband both had several questions about increasing her activity.  There were no medication changes that were made and no further testing that was ordered at that time.  She returns to clinic today  ROS: 10 point review of systems has been reviewed and considered negative with exception what is been listed in the HPI  Studies Reviewed: Marland Kitchen        TTE 12/25/22 1. Left ventricular ejection fraction, by estimation, is 55 to 60%. The  left ventricle has normal function. The left ventricle demonstrates  regional wall motion abnormalities (see scoring diagram/findings for  description). Left ventricular diastolic  parameters are consistent with Grade I diastolic dysfunction (impaired  relaxation). Wall motion suggestive of post-operative septal changes.   2. Right ventricular systolic function is normal. The right ventricular  size is normal. There is normal pulmonary artery systolic pressure.   3. The mitral valve is normal in structure. No evidence of mitral valve  regurgitation. No evidence of mitral stenosis.   4. The aortic valve is tricuspid. There is mild calcification of the  aortic valve. There is mild thickening of the aortic valve. Aortic valve  regurgitation is trivial. Aortic valve sclerosis is present, with no  evidence of aortic valve stenosis.   5. Cannot exclude a small PFO.     TEE Intraoperative 12/13/22 _ Left Ventricle: The left ventricle is unchanged from pre-bypass. has  normal  systolic function, with an ejection fraction of 60%. The cavity size was  normal.  The wall motion is normal.  _ Right Ventricle: The right ventricle appears unchanged from pre-bypass  normal  function.  _ Aorta: The aorta appears unchanged from pre-bypass.  _ Left Atrial Appendage: The left atrial appendage appears unchanged from  pre-bypass.  _ Aortic Valve: The aortic valve appears unchanged from pre-bypass. No  stenosis  present. There is no regurgitation.  _ Mitral Valve: There is trivial-mild regurgitation.  _ Tricuspid Valve: There is mild regurgitation.  _ Pulmonic Valve: The pulmonic valve appears unchanged from pre-bypass.  _ Interatrial Septum: The interatrial septum appears unchanged from  pre-bypass.  _ Pericardium: The pericardium appears unchanged from pre-bypass.  _ Comments: Post-bypass images reviewed with surgeon.    TTE 11/27/22 1. Left ventricular ejection fraction, by estimation, is 55 to 60%. The  left ventricle has normal function. The left ventricle has no regional  wall motion abnormalities. Left ventricular diastolic parameters are  consistent with Grade I diastolic  dysfunction (impaired relaxation).   2. Right ventricular systolic function is normal. The right ventricular  size is normal. Tricuspid regurgitation signal is inadequate for assessing  PA pressure.   3. The mitral valve is degenerative. Mild mitral valve regurgitation. No  evidence  of mitral stenosis.   4. The aortic valve is tricuspid. There is mild thickening of the aortic  valve. Aortic valve regurgitation is not visualized. Aortic valve  sclerosis is present, with no evidence of aortic valve stenosis.    R/LHC 11/27/22 Conclusions: Severe three-vessel coronary artery disease, as detailed below. Normal left heart filling pressures. Mildly elevated right heart and pulmonary  artery pressures. Normal Fick cardiac output/index.   Recommendations: Refer to cardiac surgery for CABG evaluation in the setting of three-vessel coronary artery disease and diabetes mellitus. Obtain echocardiogram prior to discharge today. Increase isosorbide mononitrate to 30 mg daily for additional antianginal therapy. Aggressive secondary prevention of coronary artery disease. Lexiscan MPI 10/09/22   Findings are consistent with ischemia. The study is intermediate risk.   No ST deviation was noted.   LV perfusion is abnormal. There is evidence of ischemia. There is evidence of infarction. Defect 1: There is a medium defect with moderate reduction in uptake present in the mid to basal anterior location(s) that is partially reversible. There is normal wall motion in the defect area. Consistent with ischemia.   Left ventricular function is normal. End diastolic cavity size is normal. End systolic cavity size is normal.   Unfortunately, this is a very suboptimal study due to intense GI uptake at the border of the inferior wall.  The anterior wall reversible defect might be due to artifact.   CT attenuation images show evidence of aortic and coronary calcifications.   12/2/2022Eugenie Birks MPI Pharmacological myocardial perfusion imaging study with no significant  ischemia Normal wall motion, EF estimated at 68% No EKG changes concerning for ischemia at peak stress or in recovery. CT attenuation correction images with mild aortic atherosclerosis, mild coronary calcification Low risk scan   01/22/21-TTE 1. Left ventricular ejection fraction, by estimation, is 60 to 65%. The  left ventricle has normal function. The left ventricle has no regional  wall motion abnormalities. Left ventricular diastolic parameters were  normal.   2. Right ventricular systolic function is normal. The right ventricular  size is normal.   3. The mitral valve is normal in structure. Trivial mitral valve   regurgitation.   4. The aortic valve is normal in structure. Aortic valve regurgitation is  not visualized.  Risk Assessment/Calculations:     No BP recorded.  {Refresh Note OR Click here to enter BP  :1}***       Physical Exam:   VS:  There were no vitals taken for this visit.   Wt Readings from Last 3 Encounters:  03/10/23 155 lb (70.3 kg)  01/09/23 160 lb (72.6 kg)  01/07/23 161 lb 4 oz (73.1 kg)    GEN: Well nourished, well developed in no acute distress NECK: No JVD; No carotid bruits CARDIAC: ***RRR, no murmurs, rubs, gallops RESPIRATORY:  Clear to auscultation without rales, wheezing or rhonchi  ABDOMEN: Soft, non-tender, non-distended EXTREMITIES:  No edema; No deformity   ASSESSMENT AND PLAN: .   *** {The patient has an active order for outpatient cardiac rehabilitation.   Please indicate if the patient is ready to start. Do NOT delete this.  It will auto delete.  Refresh note, then sign.              Click here to document readiness and see contraindications.  :1}  Cardiac Rehabilitation Eligibility Assessment      {Are you ordering a CV Procedure (e.g. stress test, cath, DCCV, TEE, etc)?   Press F2        :  [id number]}  Dispo: ***  Signed, Samanvi Cuccia, NP

## 2023-04-11 ENCOUNTER — Ambulatory Visit (INDEPENDENT_AMBULATORY_CARE_PROVIDER_SITE_OTHER): Payer: 59 | Admitting: Podiatry

## 2023-04-11 ENCOUNTER — Encounter: Payer: Self-pay | Admitting: Podiatry

## 2023-04-11 VITALS — Ht 65.0 in | Wt 155.0 lb

## 2023-04-11 DIAGNOSIS — E1142 Type 2 diabetes mellitus with diabetic polyneuropathy: Secondary | ICD-10-CM

## 2023-04-11 DIAGNOSIS — M79675 Pain in left toe(s): Secondary | ICD-10-CM | POA: Diagnosis not present

## 2023-04-11 DIAGNOSIS — E119 Type 2 diabetes mellitus without complications: Secondary | ICD-10-CM | POA: Diagnosis not present

## 2023-04-11 DIAGNOSIS — M79674 Pain in right toe(s): Secondary | ICD-10-CM

## 2023-04-11 DIAGNOSIS — B351 Tinea unguium: Secondary | ICD-10-CM | POA: Diagnosis not present

## 2023-04-11 DIAGNOSIS — Z0189 Encounter for other specified special examinations: Secondary | ICD-10-CM

## 2023-04-14 NOTE — Progress Notes (Signed)
ANNUAL DIABETIC FOOT EXAM  Subjective: Erica Estrada presents today for annual diabetic foot exam.  Chief Complaint  Patient presents with   Nail Problem    Pt is here for [name] unsure of last A1C PCP is [name] and LOV was in December.   Patient confirms h/o diabetes.  Patient denies any h/o foot wounds.  Patient has been diagnosed with neuropathy.  Erica Sato, MD is patient's PCP.  Past Medical History:  Diagnosis Date   Arthritis    Asthma    Coronary artery calcification    Noted on prior CTA chest (01/2021)   Depression    Diabetes mellitus without complication (HCC)    Former tobacco use    GERD (gastroesophageal reflux disease)    Graves' disease    Headache    Hepatitis 1995   Hep C.  Treated.   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   Posttraumatic stress disorder 12/23/2018   Severe recurrent major depression without psychotic features (HCC) 11/10/2017   Abdominal pain, chronic, generalized 09/20/2016   Hepatic steatosis 08/14/2016   Multiple thyroid nodules 01/12/2016   Herniation of lumbar intervertebral disc with radiculopathy 05/20/2013   Degenerative disc disease 07/18/2011   Allergic rhinitis 06/21/2011   GERD (gastroesophageal reflux disease) 04/28/2011   Glaucoma suspect with open angle 04/17/2011   Depression 12/26/2010   Vision impairment  12/26/2010   Hypertension 12/26/2010   Hyperlipidemia with target LDL less than 70 12/26/2010   H/O: substance abuse (HCC) 12/26/2010   Diabetes mellitus, type 2 (HCC) 12/26/2010   Cervical stenosis of spine 12/26/2010   Past Surgical History:  Procedure Laterality Date   ABDOMINAL HYSTERECTOMY     CORONARY ARTERY BYPASS GRAFT N/A 12/13/2022   Procedure: CORONARY ARTERY BYPASS GRAFTING (CABG) TIMES THREE UTILIZING LEFT INTERNAL MAMMARY ARTERY AND ENDOSCOPIC VEIN HARVEST RIGHT GREATER SAPHENOUS VEIN;  Surgeon: Alleen Borne, MD;  Location: [facility] OR;  Service: Open Heart Surgery;  Laterality: N/A;   EYE SURGERY Left    prosthetic eye put in   RIGHT/LEFT HEART CATH AND CORONARY ANGIOGRAPHY Bilateral 11/27/2022   Procedure: RIGHT/LEFT HEART CATH AND CORONARY ANGIOGRAPHY;  Surgeon: Yvonne Kendall, MD;  Location: [facility] INVASIVE CV LAB;  Service: Cardiovascular;  Laterality: Bilateral;   TEE WITHOUT CARDIOVERSION N/A 12/13/2022   Procedure: TRANSESOPHAGEAL ECHOCARDIOGRAM;  Surgeon: Alleen Borne, MD;  Location: [facility] OR;  Service: Open Heart Surgery;  Laterality: N/A;   TONSILLECTOMY     removed as a child   Current Outpatient Medications on File Prior to Visit  Medication Sig Dispense Refill   acetaminophen (TYLENOL) 500 MG tablet Take 1-2 tablets (500-1,000 mg total) by mouth every 6 (six) hours as needed (for pain). Maximum dose of of 4000 mg (8 tablets) in a 24 hour period. Do not take with your NORCO/VICODIN because they contain acetaminophen.  30 tablet 2   ASPERCREME LIDOCAINE 4 % CREA Apply 1 application  topically 2 (two) times daily as needed (pain). Back and knees.     aspirin EC 81 MG tablet Take 81 mg by mouth daily. Swallow whole.     atorvastatin (LIPITOR) 80 MG tablet Take 1 tablet (80 mg total) by mouth daily. 30 tablet 0   calcium carbonate (TUMS - DOSED IN MG ELEMENTAL CALCIUM) 500 MG chewable tablet Chew 2 tablets by mouth 2 (two) times daily as needed for indigestion or  heartburn.     camphor-menthol (SARNA) lotion Apply 1 application topically as needed for itching.     Cholecalciferol 25 MCG (1000 UT) capsule Take 1,000 Units by mouth daily.     cyclobenzaprine (FLEXERIL) 5 MG tablet Take 5 mg by mouth at bedtime.     DOCUSATE SODIUM PO Take by mouth daily as needed.     folic acid (FOLVITE) 1 MG tablet Take 1 mg by mouth daily.     gabapentin (NEURONTIN) 300 MG capsule Take 300 mg by mouth every evening.     HYDROcodone-acetaminophen (NORCO/VICODIN) 5-325 MG tablet Take 1 tablet by mouth every 6 (six) hours as needed. 28 tablet 0   insulin aspart (NOVOLOG FLEXPEN) 100 UNIT/ML FlexPen Inject 8 Units into the skin every evening.     insulin degludec (TRESIBA FLEXTOUCH) 200 UNIT/ML FlexTouch Pen Inject 65 Units into the skin at bedtime.     JARDIANCE 25 MG TABS tablet Take 25 mg by mouth daily.     Lancets (ONETOUCH DELICA PLUS LANCET33G) MISC Apply topically.     metFORMIN (GLUCOPHAGE) 500 MG tablet Take 1,000 mg by mouth 2 (two) times daily with a meal.     methimazole (TAPAZOLE) 5 MG tablet Take 2.5 mg by mouth daily.     mometasone (NASONEX) 50 MCG/ACT nasal spray Place 2 sprays into the nose daily as needed.     ONETOUCH ULTRA test strip USE AS DIRECTED FOR BLOOD SUGAR TESTING THREE TIMES DAILY FOR DIABETES E11.9     PARoxetine (PAXIL) 40 MG tablet Take 1 tablet (40 mg total) by mouth every morning. 30 tablet 0   potassium chloride SA (KLOR-CON M) 20 MEQ tablet Take 1 tablet (20 mEq total) by mouth daily. For 4 days then stop. 4 tablet 0   SPIRIVA HANDIHALER 18 MCG inhalation capsule Place 1 capsule into inhaler and inhale daily.     traZODone (DESYREL) 50 MG tablet Take 50 mg by mouth at bedtime.     VENTOLIN HFA 108 (90 Base) MCG/ACT inhaler Inhale 2 puffs into the lungs every 6 (six) hours as needed.     colchicine 0.6 MG tablet Take 1 tablet (0.6 mg total) by mouth 2 (two) times daily. Follow up on 10/24 with Charlsie Quest to discuss whether you need  to continue this 60 tablet 0   ferrous sulfate 325 (65 FE) MG tablet Take 1 tablet (325 mg total) by mouth every other day. 15 tablet 1   metoprolol tartrate (LOPRESSOR) 25 MG tablet Take 1 tablet (25 mg total) by mouth 2 (two) times daily. 60 tablet 1   No current facility-administered medications on file prior to visit.    Allergies  Allergen Reactions   Zoster Vac Recomb Adjuvanted     Other Reaction(s): cellulits   Ace Inhibitors Cough    Chronic cough for 3 months. Trial off lisinopril starting 05/11/2011 effective in stopping cough.   Fluticasone Other (See Comments)  choking   Social History   Occupational History   Not on file  Tobacco Use   Smoking status: Former    Current packs/day: 0.00    Average packs/day: 0.5 packs/day for 33.0 years (16.5 ttl pk-yrs)    Types: Cigarettes    Start date: 08/11/1986    Quit date: 08/11/2019    Years since quitting: 3.6   Smokeless tobacco: Never  Vaping Use   Vaping status: Never Used  Substance and Sexual Activity   Alcohol use: No   Drug use: No   Sexual activity: Yes   Family History  Problem Relation Age of Onset   Breast cancer Neg Hx    Immunization History  Administered Date(s) Administered   Fluad Trivalent(High Dose 65+) 12/19/2022   Hepatitis B, ADULT 11/19/2014, 12/29/2015   Influenza Inj Mdck Quad Pf 12/26/2017   Influenza, High Dose Seasonal PF 11/19/2014, 12/29/2015   Influenza,inj,Quad PF,6+ Mos 11/10/2013   Influenza-Unspecified 12/26/2010, 12/20/2011, 11/28/2012, 11/19/2014, 12/29/2015, 12/10/2021   Pneumococcal Conjugate-13 06/08/2014   Pneumococcal Polysaccharide-23 12/20/2011   Tdap 04/02/2012   Zoster, Live 10/27/2014     Review of Systems: Negative except as noted in the HPI.   Objective: There were no vitals filed for this visit.  Liann Spaeth is a pleasant 73 y.o. female in NAD. AAO X 3.  Title   Diabetic Foot Exam - detailed Date & Time: 04/11/2023  9:15 AM Diabetic Foot exam was  performed with the following findings: Yes  Visual Foot Exam completed.: Yes  Is there a history of foot ulcer?: No Is there a foot ulcer now?: No Is there swelling?: No Is there elevated skin temperature?: No Is there abnormal foot shape?: No Is there a claw toe deformity?: No Are the toenails long?: Yes Are the toenails thick?: Yes Are the toenails ingrown?: No Is the skin thin, fragile, shiny and hairless?": No Normal Range of Motion?: Yes Is there foot or ankle muscle weakness?: No Do you have pain in calf while walking?: No Are the shoes appropriate in style and fit?: Yes Can the patient see the bottom of their feet?: Yes Pulse Foot Exam completed.: Yes   Right Posterior Tibialis: Present Left posterior Tibialis: Diminished   Right Dorsalis Pedis: Present Left Dorsalis Pedis: Diminished     Sensory Foot Exam Completed.: Yes Semmes-Weinstein Monofilament Test "+" means "has sensation" and "-" means "no sensation"   R Site 1-Great Toe: Pos L Site 1-Great Toe: Pos   R Site 4: Pos L Site 4: Pos   R site 5: Pos L Site 5: Pos  R Site 6: Pos L Site 6: Pos     Image components are not supported.   Image components are not supported. Image components are not supported.  Tuning Fork Right vibratory: present Left vibratory: present  Comments Pt has subjective symptoms of neuropathy.      Lab Results  Component Value Date   HGBA1C 7.8 (H) 12/24/2022   ADA Risk Categorization: Low Risk :  Patient has all of the following: Intact protective sensation No prior foot ulcer  No severe deformity Pedal pulses present  Assessment: 1. Pain due to onychomycosis of toenails of both feet   2. Diabetic peripheral neuropathy associated with type 2 diabetes mellitus (HCC)   3. Encounter for diabetic foot exam [facility])     Plan: -Patient was evaluated today. All questions/concerns addressed on today's visit. -Diabetic foot examination performed today. -Continue diabetic foot care  principles: inspect feet daily,  monitor glucose as recommended by PCP and/or Endocrinologist, and follow prescribed diet per PCP, Endocrinologist and/or dietician. -Patient to continue soft, supportive shoe gear daily. -Toenails 1-5 b/l were debrided in length and girth with sterile nail nippers and dremel without iatrogenic bleeding.  -Patient/POA to call should there be question/concern in the interim. Return in about 3 months (around 07/10/2023).  Freddie Breech, DPM      [facility] LOCATION: [address] [place] [address]                   Office [phone]   [facility] LOCATION: [address] [place], [place] [address] Office [phone]

## 2023-04-22 ENCOUNTER — Ambulatory Visit: Payer: 59 | Attending: Cardiology | Admitting: Cardiology

## 2023-04-22 ENCOUNTER — Encounter: Payer: Self-pay | Admitting: Cardiology

## 2023-04-22 VITALS — BP 130/84 | HR 58 | Wt 160.0 lb

## 2023-04-22 DIAGNOSIS — E785 Hyperlipidemia, unspecified: Secondary | ICD-10-CM

## 2023-04-22 DIAGNOSIS — Z951 Presence of aortocoronary bypass graft: Secondary | ICD-10-CM

## 2023-04-22 DIAGNOSIS — R0602 Shortness of breath: Secondary | ICD-10-CM | POA: Diagnosis not present

## 2023-04-22 DIAGNOSIS — I2510 Atherosclerotic heart disease of native coronary artery without angina pectoris: Secondary | ICD-10-CM | POA: Diagnosis not present

## 2023-04-22 DIAGNOSIS — R079 Chest pain, unspecified: Secondary | ICD-10-CM | POA: Diagnosis not present

## 2023-04-22 DIAGNOSIS — D649 Anemia, unspecified: Secondary | ICD-10-CM

## 2023-04-22 DIAGNOSIS — I739 Peripheral vascular disease, unspecified: Secondary | ICD-10-CM | POA: Diagnosis not present

## 2023-04-22 DIAGNOSIS — Z794 Long term (current) use of insulin: Secondary | ICD-10-CM

## 2023-04-22 DIAGNOSIS — I10 Essential (primary) hypertension: Secondary | ICD-10-CM

## 2023-04-22 DIAGNOSIS — E118 Type 2 diabetes mellitus with unspecified complications: Secondary | ICD-10-CM

## 2023-04-22 MED ORDER — ISOSORBIDE MONONITRATE ER 30 MG PO TB24: 30.0000 mg | ORAL_TABLET | Freq: Two times a day (BID) | ORAL | 5 refills | Status: DC

## 2023-04-22 NOTE — Patient Instructions (Signed)
 Medication Instructions:  INCREASE the Imdur  to 30 mg twice daily  *If you need a refill on your cardiac medications before your next appointment, please call your pharmacy*   Lab Work: None ordered If you have labs (blood work) drawn today and your tests are completely normal, you will receive your results only by: MyChart Message (if you have MyChart) OR A paper copy in the mail If you have any lab test that is abnormal or we need to change your treatment, we will call you to review the results.   Testing/Procedures: Your physician has requested that you have an echocardiogram. Echocardiography is a painless test that uses sound waves to create images of your heart. It provides your doctor with information about the size and shape of your heart and how well your heart's chambers and valves are working.   You may receive an ultrasound enhancing agent through an IV if needed to better visualize your heart during the echo. This procedure takes approximately one hour.  There are no restrictions for this procedure.  This will take place at [address] [facility]) [address], [place] [address]  Please note: We ask at that you not bring children with you during ultrasound (echo/ vascular) testing. Due to room size and safety concerns, children are not allowed in the ultrasound rooms during exams. Our front office staff cannot provide observation of children in our lobby area while testing is being conducted. An adult accompanying a patient to their appointment will only be allowed in the ultrasound room at the discretion of the ultrasound technician under special circumstances. We apologize for any inconvenience.  Your physician has requested that you have an ankle brachial index (ABI). During this test an ultrasound and blood pressure cuff are used to evaluate the arteries that supply the arms and legs with blood.  Allow thirty minutes for this exam.  There are no restrictions or  special instructions.  This will take place at [address] [facility] [address] [facility] [address], [place] [address]   Follow-Up: At [facility], you and your health needs are our priority.  As part of our continuing mission to provide you with exceptional heart care, we have created designated Provider Care Teams.  These Care Teams include your primary Cardiologist (physician) and Advanced Practice Providers (APPs -  Physician Assistants and Nurse Practitioners) who all work together to provide you with the care you need, when you need it.  We recommend signing up for the patient portal called "MyChart".  Sign up information is provided on this After Visit Summary.  MyChart is used to connect with patients for Virtual Visits (Telemedicine).  Patients are able to view lab/test results, encounter notes, upcoming appointments, etc.  Non-urgent messages can be sent to your provider as well.   To learn more about what you can do with MyChart, go to [email or web address].    Your next appointment:   After testing  Provider:   You may see Constancia Delton, MD or one of the following Advanced Practice Providers on your designated Care Team:   Ronald Cockayne, NP

## 2023-04-22 NOTE — Progress Notes (Signed)
 Cardiology Office Note:  .   Date:  04/22/2023  ID:  Erica Estrada, DOB [birth year], MRN [id number] PCP: Macie Saxon, MD  [facility] Providers Cardiologist:  Constancia Delton, MD    History of Present Illness: .   Erica Estrada is a 73 y.o. female with a past medical history of coronary artery disease status post CABG x 3 (12/13/22), hypertension, vomiting, type 2 diabetes, former smoker x 20+ years, who presents today for follow-up.   Previous echocardiogram revealed LVEF 60-65%.  Lexiscan  Myoview  did not show any ischemia.  CT angio of the chest in 11/22 showed three-vessel coronary artery calcifications.  Repeat Lexiscan  09/2022 showed findings consistent with ischemia on the study was an indeterminate risk. Left heart catheterization completed 11/27/2022 revealed severe three-vessel coronary artery disease, normal left heart filling pressures, mildly elevated right heart and pulmonary artery pressures with normal Fick cardiac output/index.  She was referred to cardiac surgery for CABG evaluation in the setting of three-vessel coronary artery disease and diabetes.  Her isosorbide  mononitrate was increased to 30 mg daily for additional antianginal therapy and continue with aggressive secondary prevention of coronary artery disease. Echocardiogram 11/27/2022 showed ejection fraction 55 to 60%, G1 DD, mild mitral regurgitation.  She was considered stable for discharge and was discharged home on 11/27/2022.  She was to return to [facility] on Thursday 12/13/22 for surgery.  Patient underwent coronary artery bypass grafting x 3 with LIMA-> LAD, SVG-> PDA, SVG-> diagonal.  Saphenous vein harvest was completed from the right lower extremity.  There was spontaneous return of sinus rhythm after cross-clamp removal with the time of 77 minutes.  The distal and proximal anastomosis were checked for hemostasis.  Position of the graft was satisfactory.  She had 2 temporary epicardial  pacing wires that were placed in the right atrium and 2 on the right ventricle.  The patient was weaned from CPB without difficulty on no inotropes.  Cardiac output was 5 L/min.  TEE showed normal LV systolic function.  Heparin  was fully reversed with protamine  and the aortic and venous cannulas were removed.  Hemostasis was achieved.  Mediastinal and bilateral pleural drainage tubes were placed the sternum was closed in the usual fashion.  She was transported to the surgical intensive care unit in stable condition.  She did not require inotropic/pressor support postoperatively.  She underwent removal of mediastinal chest tubes, arterial line, and Swan on postop day 1.  She was transitioned off of insulin  drip to Levemir  and sliding scale coverage.  She was on Jardiance, insulin , and metformin  prior to surgery.  Metformin  was restarted on postop day 4.  Jardiance was needed to be restarted at discharge.  Her preop hemoglobin A1c was 8.2.  Which would require close medical follow-up after discharge.  She remained in sinus rhythm and was stable for transfer to progressive care unit on 12/16/2022.  PT and OT evaluations were obtained and they recommended home health PT and OT.  She needed a lot of encouragement to ambulate.  She had mild volume overload and was diuresed accordingly.  Epicardial pacing wires were removed on 12/18/2022.  She had increased appetite was tolerated the diet and with administration of laxatives was able to have a bowel movement.  She was able to ambulate on room air with good oxygenation.  All her wounds were clean, dry, and healing without signs of infection.  Ambulation improved and she was felt stable for discharge on 12/20/22.she presented back to [facility] emergency department 12/24/2010 with complaint of chest pain.  High-sensitivity troponin 45 and 46.  There was concern for pericarditis and she was treated with colchicine  and ibuprofen  repeat echocardiogram revealed LVEF of 55 to 60%,  G1 DD, some regional wall motion abnormalities were noted suggestive of postoperative septal changes. Without concerns for valvular abnormalities. Sed rate was elevated at 92. She had noted improvement with NSAIDs and anti-inflammatories and was able to be discharged on 12/26/2022.   She was last seen in clinic 12/30/2022 was feeling better.  She did notice some occasional swelling to bilateral lower extremities.  She does have upcoming appointment with [facility] for follow-up chest x-ray and to be released to cardiac rehab.  She and her husband both had several questions about increasing her activity.  There were no medication changes that were made and no further testing that was ordered at that time.  She returns to clinic today accompanied by family member.  She states she was recently evaluated at the [facility] emergency department on 03/19/2023 with complaints of right-sided chest pain.  Her workup in the emergency department was unrevealing with no changes noted to EKG and negative troponins and she was advised to follow-up with cardiology.  She continues to have reproducible chest discomfort on the right side of her sternotomy incision.  She states that it is bothersome with movement and improves with rest.  She also has some occasional shortness of breath with the discomfort.  She has noted that she has bilateral leg pain primarily on the left and occasional swelling to her ankles in the night.  She does complain of intermittent claudication today.  She states that she has been compliant with her current medication regimen.  Unfortunately she does have several questions related to being able to drive today and she was unable to participate in cardiac rehab postoperative CABG.  ROS: 10 point review of systems has been reviewed and considered negative with exception what is been listed in the HPI  Studies Reviewed: Aaron Aas   EKG Interpretation Date/Time:  Monday April 22 2023 08:37:39 EST Ventricular Rate:   58 PR Interval:  170 QRS Duration:  82 QT Interval:  404 QTC Calculation: 396 R Axis:   37  Text Interpretation: Sinus bradycardia ST & T wave abnormality, consider lateral ischemia When compared with ECG of 10-Mar-2023 19:23, Non-specific change in ST segment in Inferior leads ST now depressed in Lateral leads T wave inversion no longer evident in Inferior leads T wave inversion less evident in Lateral leads Confirmed by Ronald Cockayne ([id number]) on 04/22/2023 8:42:33 AM    TTE 12/25/22 1. Left ventricular ejection fraction, by estimation, is 55 to 60%. The  left ventricle has normal function. The left ventricle demonstrates  regional wall motion abnormalities (see scoring diagram/findings for  description). Left ventricular diastolic  parameters are consistent with Grade I diastolic dysfunction (impaired  relaxation). Wall motion suggestive of post-operative septal changes.   2. Right ventricular systolic function is normal. The right ventricular  size is normal. There is normal pulmonary artery systolic pressure.   3. The mitral valve is normal in structure. No evidence of mitral valve  regurgitation. No evidence of mitral stenosis.   4. The aortic valve is tricuspid. There is mild calcification of the  aortic valve. There is mild thickening of the aortic valve. Aortic valve  regurgitation is trivial. Aortic valve sclerosis is present, with no  evidence of aortic valve stenosis.   5. Cannot exclude a small PFO.  TEE Intraoperative 12/13/22 _ Left Ventricle: The left ventricle is unchanged from pre-bypass. has  normal  systolic function, with an ejection fraction of 60%. The cavity size was  normal.  The wall motion is normal.  _ Right Ventricle: The right ventricle appears unchanged from pre-bypass  normal  function.  _ Aorta: The aorta appears unchanged from pre-bypass.  _ Left Atrial Appendage: The left atrial appendage appears unchanged from  pre-bypass.  _ Aortic Valve: The  aortic valve appears unchanged from pre-bypass. No  stenosis  present. There is no regurgitation.  _ Mitral Valve: There is trivial-mild regurgitation.  _ Tricuspid Valve: There is mild regurgitation.  _ Pulmonic Valve: The pulmonic valve appears unchanged from pre-bypass.  _ Interatrial Septum: The interatrial septum appears unchanged from  pre-bypass.  _ Pericardium: The pericardium appears unchanged from pre-bypass.  _ Comments: Post-bypass images reviewed with surgeon.    TTE 11/27/22 1. Left ventricular ejection fraction, by estimation, is 55 to 60%. The  left ventricle has normal function. The left ventricle has no regional  wall motion abnormalities. Left ventricular diastolic parameters are  consistent with Grade I diastolic  dysfunction (impaired relaxation).   2. Right ventricular systolic function is normal. The right ventricular  size is normal. Tricuspid regurgitation signal is inadequate for assessing  PA pressure.   3. The mitral valve is degenerative. Mild mitral valve regurgitation. No  evidence of mitral stenosis.   4. The aortic valve is tricuspid. There is mild thickening of the aortic  valve. Aortic valve regurgitation is not visualized. Aortic valve  sclerosis is present, with no evidence of aortic valve stenosis.    R/LHC 11/27/22 Conclusions: Severe three-vessel coronary artery disease, as detailed below. Normal left heart filling pressures. Mildly elevated right heart and pulmonary artery pressures. Normal Fick cardiac output/index.   Recommendations: Refer to cardiac surgery for CABG evaluation in the setting of three-vessel coronary artery disease and diabetes mellitus. Obtain echocardiogram prior to discharge today. Increase isosorbide  mononitrate to 30 mg daily for additional antianginal therapy. Aggressive secondary prevention of coronary artery disease. Lexiscan  MPI 10/09/22   Findings are consistent with ischemia. The study is intermediate risk.    No ST deviation was noted.   LV perfusion is abnormal. There is evidence of ischemia. There is evidence of infarction. Defect 1: There is a medium defect with moderate reduction in uptake present in the mid to basal anterior location(s) that is partially reversible. There is normal wall motion in the defect area. Consistent with ischemia.   Left ventricular function is normal. End diastolic cavity size is normal. End systolic cavity size is normal.   Unfortunately, this is a very suboptimal study due to intense GI uptake at the border of the inferior wall.  The anterior wall reversible defect might be due to artifact.   CT attenuation images show evidence of aortic and coronary calcifications.   02/10/2021- Lexiscan  MPI Pharmacological myocardial perfusion imaging study with no significant  ischemia Normal wall motion, EF estimated at 68% No EKG changes concerning for ischemia at peak stress or in recovery. CT attenuation correction images with mild aortic atherosclerosis, mild coronary calcification Low risk scan   01/22/21-TTE 1. Left ventricular ejection fraction, by estimation, is 60 to 65%. The  left ventricle has normal function. The left ventricle has no regional  wall motion abnormalities. Left ventricular diastolic parameters were  normal.   2. Right ventricular systolic function is normal. The right ventricular  size is normal.   3. The  mitral valve is normal in structure. Trivial mitral valve  regurgitation.   4. The aortic valve is normal in structure. Aortic valve regurgitation is  not visualized.  Risk Assessment/Calculations:             Physical Exam:   VS:  BP 130/84 (BP Location: Left Arm, Patient Position: Sitting, Cuff Size: Normal)   Pulse (!) 58   Wt 160 lb (72.6 kg)   SpO2 98%   BMI 26.63 kg/m    Wt Readings from Last 3 Encounters:  04/22/23 160 lb (72.6 kg)  04/11/23 155 lb (70.3 kg)  03/10/23 155 lb (70.3 kg)    GEN: Well nourished, well developed in no  acute distress NECK: No JVD; No carotid bruits CARDIAC: RRR, no murmurs, rubs, gallops RESPIRATORY:  Clear to auscultation without rales, wheezing or rhonchi  ABDOMEN: Soft, non-tender, non-distended EXTREMITIES:  No edema; No deformity   ASSESSMENT AND PLAN: .   Coronary artery disease s/p CABG x 3 (12/13/2022) she was recently evaluated in the emergency department [facility] for chest pain.  Workup was revealing.  EKG today reveals sinus bradycardia with ST-T wave abnormalities for lateral ischemia that is unchanged from prior studies that she has had in the past.  She has been scheduled for repeat echocardiogram today to follow-up wall motion abnormality.  Her Imdur  has been increased to 30 mg twice daily, she has been left on colchicine  0.6 mg twice daily with her continued chest discomfort that was concerning for pericarditis.  After she is finished the current prescription that she has for colchicine  she can stop this medication as she is 3 months out initial diagnoses of presumed pericarditis without pericardial effusion.  She has been continued on aspirin  81 mg daily, atorvastatin  80 mg daily metoprolol  tartrate 25 mg twice daily.  Referral sent back to cardiac rehab today.  Patient is advised at this point that she can return to driving.  Primary hypertension with blood pressure today 130/84.  Blood pressure was slightly elevated on arrival but recheck has remained stable.  She is continued on metoprolol  tartrate 25 mg twice daily and amlodipine  10 mg daily.  She has been encouraged to continue to monitor her blood pressures 1 to 2 hours postmedication administration as well.  Mixed hyperlipidemia with last LDL 47 which remained at goal.  She is continued on atorvastatin  80 mg daily.  Type II diabetes with last hemoglobin A1c of 7.8.  She is continued on Jardiance, insulin , Tresiba , and metformin , this continues to be managed by PCP.  Anemia with last hemoglobin of 11.8 which is remained  stable.  Patient denies any signs of bleeding with no blood noted in her stool or urine.  She continues to take iron and folic acid .  Intermittent claudication with burning and pain to her bilateral lower extremities without swelling.  Patient is being scheduled for ABIs.  She has palpable pulses but complains of more pain in her left leg than the right.  And her right leg was her saphenous vein harvest site.    Cardiac Rehabilitation Eligibility Assessment         Dispo: Patient return to clinic to see MD/APP once testing is completed or sooner if needed for reevaluation of symptoms.  Signed, Javen Ridings, NP

## 2023-05-03 ENCOUNTER — Ambulatory Visit: Payer: 59 | Admitting: Pulmonary Disease

## 2023-05-09 ENCOUNTER — Ambulatory Visit (INDEPENDENT_AMBULATORY_CARE_PROVIDER_SITE_OTHER): Payer: 59

## 2023-05-09 ENCOUNTER — Ambulatory Visit: Payer: 59 | Attending: Cardiology

## 2023-05-09 DIAGNOSIS — R0602 Shortness of breath: Secondary | ICD-10-CM

## 2023-05-09 DIAGNOSIS — I739 Peripheral vascular disease, unspecified: Secondary | ICD-10-CM | POA: Diagnosis not present

## 2023-05-09 DIAGNOSIS — R079 Chest pain, unspecified: Secondary | ICD-10-CM | POA: Diagnosis not present

## 2023-05-09 LAB — ECHOCARDIOGRAM COMPLETE
AR max vel: 2.14 cm2
AV Area VTI: 2.09 cm2
AV Area mean vel: 2.11 cm2
AV Mean grad: 5 mm[Hg]
AV Peak grad: 9.5 mm[Hg]
Ao pk vel: 1.54 m/s
Area-P 1/2: 4.02 cm2
S' Lateral: 3.4 cm

## 2023-05-09 MED ORDER — PERFLUTREN LIPID MICROSPHERE
1.0000 mL | INTRAVENOUS | Status: AC | PRN
Start: 2023-05-09 — End: 2023-05-09
  Administered 2023-05-09: 2 mL via INTRAVENOUS

## 2023-05-10 NOTE — Progress Notes (Signed)
 Heart squeeze noted 55 to 60%, which is normal function, there is mild stiffness likely related to age and blood pressure.  There is trivial leakage noted in the mitral valve, mild calcification or hardening around the aortic valve.  Overall reassuring study no findings to suggest symptoms.

## 2023-05-12 LAB — VAS US ABI WITH/WO TBI
Left ABI: 0.91
Right ABI: 0.94

## 2023-05-14 NOTE — Progress Notes (Signed)
 Mild disease noted in the right and left lower extremities.  Recommend repeat study in 12 months with arterial duplex accompanying ABIs.  Continue with ambulation to minimize symptoms.  Keep previously scheduled follow-up appointments.

## 2023-05-15 ENCOUNTER — Encounter: Payer: Self-pay | Admitting: Cardiology

## 2023-05-15 ENCOUNTER — Ambulatory Visit: Payer: 59 | Attending: Cardiology | Admitting: Cardiology

## 2023-05-15 VITALS — BP 120/50 | HR 61 | Ht 65.0 in | Wt 162.5 lb

## 2023-05-15 DIAGNOSIS — R0602 Shortness of breath: Secondary | ICD-10-CM | POA: Diagnosis not present

## 2023-05-15 DIAGNOSIS — I739 Peripheral vascular disease, unspecified: Secondary | ICD-10-CM

## 2023-05-15 DIAGNOSIS — I2510 Atherosclerotic heart disease of native coronary artery without angina pectoris: Secondary | ICD-10-CM

## 2023-05-15 DIAGNOSIS — I10 Essential (primary) hypertension: Secondary | ICD-10-CM | POA: Diagnosis not present

## 2023-05-15 DIAGNOSIS — Z951 Presence of aortocoronary bypass graft: Secondary | ICD-10-CM

## 2023-05-15 DIAGNOSIS — E118 Type 2 diabetes mellitus with unspecified complications: Secondary | ICD-10-CM

## 2023-05-15 DIAGNOSIS — Z794 Long term (current) use of insulin: Secondary | ICD-10-CM

## 2023-05-15 DIAGNOSIS — E785 Hyperlipidemia, unspecified: Secondary | ICD-10-CM

## 2023-05-15 MED ORDER — POTASSIUM CHLORIDE CRYS ER 20 MEQ PO TBCR: EXTENDED_RELEASE_TABLET | ORAL | 1 refills | Status: AC

## 2023-05-15 MED ORDER — FUROSEMIDE 20 MG PO TABS: ORAL_TABLET | ORAL | 3 refills | Status: DC

## 2023-05-15 NOTE — Progress Notes (Signed)
 Cardiology Office Note:  .   Date:  05/15/2023  ID:  Erica Estrada, DOB May 17, 1950, MRN [id number] PCP: [name], MD  [facility] Providers Cardiologist:  Debbe Odea, MD    History of Present Illness: .   Erica Estrada is a 73 y.o. female with a past medical history of coronary disease status post CABG x 3 (12/13/2022), hypertension, type 2 diabetes, former smoker x 20+ years, hyperlipidemia, anemia, who presents today for follow-up of her coronary artery disease.   Previous echocardiogram revealed LVEF 60-65%.  Lexiscan Myoview did not show any ischemia.  CT angio of the chest in 11/22 showed three-vessel coronary artery calcifications.  Repeat Lexiscan 09/2022 showed findings consistent with ischemia on the study was an indeterminate risk. Left heart catheterization completed 11/27/2022 revealed severe three-vessel coronary artery disease, normal left heart filling pressures, mildly elevated right heart and pulmonary artery pressures with normal Fick cardiac output/index.  She was referred to cardiac surgery for CABG evaluation in the setting of three-vessel coronary artery disease and diabetes.  Her isosorbide mononitrate was increased to 30 mg daily for additional antianginal therapy and continue with aggressive secondary prevention of coronary artery disease. Echocardiogram 11/27/2022 showed ejection fraction 55 to 60%, G1 DD, mild mitral regurgitation.  She was considered stable for discharge and was discharged home on 11/27/2022.  She was to return to [facility] on Thursday 12/13/22 for surgery.  Patient underwent coronary artery bypass grafting x 3 with LIMA-> LAD, SVG-> PDA, SVG-> diagonal.  Saphenous vein harvest was completed from the right lower extremity.  There was spontaneous return of sinus rhythm after cross-clamp removal with the time of 77 minutes.  The distal and proximal anastomosis were checked for hemostasis.  Position of the graft was  satisfactory.  She had 2 temporary epicardial pacing wires that were placed in the right atrium and 2 on the right ventricle.  The patient was weaned from CPB without difficulty on no inotropes.  Cardiac output was 5 L/min.  TEE showed normal LV systolic function.  Heparin was fully reversed with protamine and the aortic and venous cannulas were removed.  Hemostasis was achieved.  Mediastinal and bilateral pleural drainage tubes were placed the sternum was closed in the usual fashion.  She was transported to the surgical intensive care unit in stable condition.  She did not require inotropic/pressor support postoperatively.  She underwent removal of mediastinal chest tubes, arterial line, and Swan on postop day 1.  She was transitioned off of insulin drip to Levemir and sliding scale coverage.  She was on Jardiance, insulin, and metformin prior to surgery.  Metformin was restarted on postop day 4.  Jardiance was needed to be restarted at discharge.  Her preop hemoglobin A1c was 8.2.  Which would require close medical follow-up after discharge.  She remained in sinus rhythm and was stable for transfer to progressive care unit on 12/16/2022.  PT and OT evaluations were obtained and they recommended home health PT and OT.  She needed a lot of encouragement to ambulate.  She had mild volume overload and was diuresed accordingly.  Epicardial pacing wires were removed on 12/18/2022.  She had increased appetite was tolerated the diet and with administration of laxatives was able to have a bowel movement.  She was able to ambulate on room air with good oxygenation.  All her wounds were clean, dry, and healing without signs of infection.  Ambulation improved and she was felt stable for discharge on  12/20/22.she presented back to [facility] emergency department 12/24/2010 with complaint of chest pain.  High-sensitivity troponin 45 and 46.  There was concern for pericarditis and she was treated with colchicine and ibuprofen  repeat echocardiogram revealed LVEF of 55 to 60%, G1 DD, some regional wall motion abnormalities were noted suggestive of postoperative septal changes. Without concerns for valvular abnormalities. Sed rate was elevated at 92. She had noted improvement with NSAIDs and anti-inflammatories and was able to be discharged on 12/26/2022.   She was last seen in clinic 04/22/2023 accompanied by family member.  She recently been evaluated in [facility] emergency department complaints of right-sided chest pain.  Workup was unrevealing.  She had no complaints of claudication and burning and pain to her bilateral lower extremities without swelling and she was scheduled for ABIs.  She returns to clinic today accompanied by family member.  She states she continues to have shortness of breath on exertion.  She states it is bothersome and improves with rest.  She occasionally has swelling to her bilateral lower extremities.  She continues to complain of burning into the bottom of her feet but she also had recent ABIs that were completed that revealed minimal disease.  She does dietary indiscretions with increased sodium foods.  She also is concerned about weight gain.  States that she has been compliant with her current medication regimen.  Denies any hospitalizations or visits to the emergency department.  ROS: 10 point review of systems has been reviewed and considered negative with exception was been listed in the HPI  Studies Reviewed: Marland Kitchen   EKG Interpretation Date/Time:  Wednesday May 15 2023 13:37:18 EST Ventricular Rate:  61 PR Interval:  140 QRS Duration:  76 QT Interval:  392 QTC Calculation: 394 R Axis:   57  Text Interpretation: Normal sinus rhythm ST & T wave abnormality, consider lateral ischemia When compared with ECG of 22-Apr-2023 08:37, No significant change was found Confirmed by Charlsie Quest ([id number]) on 05/15/2023 1:53:15 PM    2D echo 05/09/2023 1. Left ventricular ejection fraction, by  estimation, is 55 to 60%. The  left ventricle has normal function. The left ventricle has no regional  wall motion abnormalities. Left ventricular diastolic parameters are  consistent with Grade I diastolic  dysfunction (impaired relaxation).   2. Right ventricular systolic function is normal. The right ventricular  size is normal. Tricuspid regurgitation signal is inadequate for assessing  PA pressure.   3. The mitral valve is normal in structure. Trivial mitral valve  regurgitation. No evidence of mitral stenosis.   4. The aortic valve is normal in structure. There is mild calcification  of the aortic valve. Aortic valve regurgitation is not visualized. No  aortic stenosis is present.   5. The inferior vena cava is normal in size with greater than 50%  respiratory variability, suggesting right atrial pressure of 3 mmHg.   TTE 12/25/22 1. Left ventricular ejection fraction, by estimation, is 55 to 60%. The  left ventricle has normal function. The left ventricle demonstrates  regional wall motion abnormalities (see scoring diagram/findings for  description). Left ventricular diastolic  parameters are consistent with Grade I diastolic dysfunction (impaired  relaxation). Wall motion suggestive of post-operative septal changes.   2. Right ventricular systolic function is normal. The right ventricular  size is normal. There is normal pulmonary artery systolic pressure.   3. The mitral valve is normal in structure. No evidence of mitral valve  regurgitation. No evidence of mitral stenosis.  4. The aortic valve is tricuspid. There is mild calcification of the  aortic valve. There is mild thickening of the aortic valve. Aortic valve  regurgitation is trivial. Aortic valve sclerosis is present, with no  evidence of aortic valve stenosis.   5. Cannot exclude a small PFO.    TEE Intraoperative 12/13/22 _ Left Ventricle: The left ventricle is unchanged from pre-bypass. has  normal  systolic  function, with an ejection fraction of 60%. The cavity size was  normal.  The wall motion is normal.  _ Right Ventricle: The right ventricle appears unchanged from pre-bypass  normal  function.  _ Aorta: The aorta appears unchanged from pre-bypass.  _ Left Atrial Appendage: The left atrial appendage appears unchanged from  pre-bypass.  _ Aortic Valve: The aortic valve appears unchanged from pre-bypass. No  stenosis  present. There is no regurgitation.  _ Mitral Valve: There is trivial-mild regurgitation.  _ Tricuspid Valve: There is mild regurgitation.  _ Pulmonic Valve: The pulmonic valve appears unchanged from pre-bypass.  _ Interatrial Septum: The interatrial septum appears unchanged from  pre-bypass.  _ Pericardium: The pericardium appears unchanged from pre-bypass.  _ Comments: Post-bypass images reviewed with surgeon.    TTE 11/27/22 1. Left ventricular ejection fraction, by estimation, is 55 to 60%. The  left ventricle has normal function. The left ventricle has no regional  wall motion abnormalities. Left ventricular diastolic parameters are  consistent with Grade I diastolic  dysfunction (impaired relaxation).   2. Right ventricular systolic function is normal. The right ventricular  size is normal. Tricuspid regurgitation signal is inadequate for assessing  PA pressure.   3. The mitral valve is degenerative. Mild mitral valve regurgitation. No  evidence of mitral stenosis.   4. The aortic valve is tricuspid. There is mild thickening of the aortic  valve. Aortic valve regurgitation is not visualized. Aortic valve  sclerosis is present, with no evidence of aortic valve stenosis.    R/LHC 11/27/22 Conclusions: Severe three-vessel coronary artery disease, as detailed below. Normal left heart filling pressures. Mildly elevated right heart and pulmonary artery pressures. Normal Fick cardiac output/index.   Recommendations: Refer to cardiac surgery for CABG evaluation in  the setting of three-vessel coronary artery disease and diabetes mellitus. Obtain echocardiogram prior to discharge today. Increase isosorbide mononitrate to 30 mg daily for additional antianginal therapy. Aggressive secondary prevention of coronary artery disease. Lexiscan MPI 10/09/22   Findings are consistent with ischemia. The study is intermediate risk.   No ST deviation was noted.   LV perfusion is abnormal. There is evidence of ischemia. There is evidence of infarction. Defect 1: There is a medium defect with moderate reduction in uptake present in the mid to basal anterior location(s) that is partially reversible. There is normal wall motion in the defect area. Consistent with ischemia.   Left ventricular function is normal. End diastolic cavity size is normal. End systolic cavity size is normal.   Unfortunately, this is a very suboptimal study due to intense GI uptake at the border of the inferior wall.  The anterior wall reversible defect might be due to artifact.   CT attenuation images show evidence of aortic and coronary calcifications.   12/2/2022Eugenie Birks MPI Pharmacological myocardial perfusion imaging study with no significant  ischemia Normal wall motion, EF estimated at 68% No EKG changes concerning for ischemia at peak stress or in recovery. CT attenuation correction images with mild aortic atherosclerosis, mild coronary calcification Low risk scan   01/22/21-TTE 1. Left  ventricular ejection fraction, by estimation, is 60 to 65%. The  left ventricle has normal function. The left ventricle has no regional  wall motion abnormalities. Left ventricular diastolic parameters were  normal.   2. Right ventricular systolic function is normal. The right ventricular  size is normal.   3. The mitral valve is normal in structure. Trivial mitral valve  regurgitation.   4. The aortic valve is normal in structure. Aortic valve regurgitation is  not visualized.  Risk  Assessment/Calculations:             Physical Exam:   VS:  BP (!) 120/50 (BP Location: Left Arm, Patient Position: Sitting, Cuff Size: Normal)   Pulse 61   Ht 5\' 5"  (1.651 m)   Wt 162 lb 8 oz (73.7 kg)   SpO2 97%   BMI 27.04 kg/m    Wt Readings from Last 3 Encounters:  05/15/23 162 lb 8 oz (73.7 kg)  04/22/23 160 lb (72.6 kg)  04/11/23 155 lb (70.3 kg)    GEN: Well nourished, well developed in no acute distress NECK: No JVD; No carotid bruits CARDIAC: RRR, no murmurs, rubs, gallops RESPIRATORY:  Clear with diminished bases to auscultation without rales, wheezing or rhonchi  ABDOMEN: Soft, non-tender, obese, non-distended EXTREMITIES: Trace pretibial edema; No deformity   ASSESSMENT AND PLAN: .   Coronary artery disease status post CABG x 3 (12/13/2022).  EKG today reveals sinus rhythm with a rate of 61 with no acute ischemic changes noted from previous studies.  She denies any angina today.  She has been continued on aspirin 81 mg daily, atorvastatin 80 mg daily, Imdur 30 mg daily.  She had previously been on colchicine for presumed pericarditis during recent hospitalization without pericardial effusion.  After she finished the prescription she was on the colchicine was to be stopped.  There is some confusion on her medications today as it is still listed on her medication list.  Patient is unsure as whether she is still taking colchicine at this time.  Dyspnea on exertion that has been ongoing with recent echocardiogram completed revealing an LVEF of 55 to 60%, no regional wall motion abnormalities, G1 DD, right ventricular systolic function is normal in size is normal, trivial mitral regurgitation, mild calcification of the aortic valve.  No findings suggestive of dyspnea.  Unfortunately after she had CABG she was unable to participate in cardiac rehab due to lack of transportation.  Dyspnea is likely a component of deconditioning which she is starting to do small activities around the  house.  With continued shortness of breath and peripheral edema she is being started on furosemide 20 mg for weight gain of 2 pounds, peripheral edema, and swelling as well as KCl 20 mEq when she takes furosemide.  She will have a BMP in 2 weeks after being started on diuretic therapy.  Primary hypertension blood pressure 120/50 today.  Blood pressure has been stable.  She is continued on metoprolol tartrate 25 mg twice daily amlodipine 10 mg daily as well as Imdur 30 mg.  She has been encouraged to continue to monitor pressure 1 to 2 hours postmedication administration as well.  Mixed hyperlipidemia with an LDL 47 which is remained at goal of less than 55.  She is continued on atorvastatin 80 mg daily.  Type 2 diabetes with last hemoglobin A1c 7.8.  She is continued on Jardiance, insulin, Tresiba, and metformin.  Ongoing management per PCP  Intermittent claudication with ABIs revealing only mild disease in  her bilateral left and right lower extremity.  Patient encouraged to increase her ambulation.  She has also been advised to follow-up with her PCP for the potential of neuropathy.       Dispo: Patient return to clinic to see MD/APP in 6 weeks or sooner if needed for reevaluation of symptoms.  Signed, Graziella Connery, NP

## 2023-05-15 NOTE — Patient Instructions (Signed)
 Medication Instructions:  TAKE Furosemide 20 mg once daily as needed for a weight gain of 2 pounds overngiht, swelling or shortness of breath TAKE Potassium 20 mEq once daily as needed for a weight gain of 2 pounds overngiht, swelling or shortness of breath *If you need a refill on your cardiac medications before your next appointment, please call your pharmacy*   Lab Work: None ordered If you have labs (blood work) drawn today and your tests are completely normal, you will receive your results only by: MyChart Message (if you have MyChart) OR A paper copy in the mail If you have any lab test that is abnormal or we need to change your treatment, we will call you to review the results.   Testing/Procedures: None ordered   Follow-Up: At [facility], you and your health needs are our priority.  As part of our continuing mission to provide you with exceptional heart care, we have created designated Provider Care Teams.  These Care Teams include your primary Cardiologist (physician) and Advanced Practice Providers (APPs -  Physician Assistants and Nurse Practitioners) who all work together to provide you with the care you need, when you need it.  We recommend signing up for the patient portal called "MyChart".  Sign up information is provided on this After Visit Summary.  MyChart is used to connect with patients for Virtual Visits (Telemedicine).  Patients are able to view lab/test results, encounter notes, upcoming appointments, etc.  Non-urgent messages can be sent to your provider as well.   To learn more about what you can do with MyChart, go to [email or web address].    Your next appointment:   6 week(s)  Provider:   Charlsie Quest, NP

## 2023-06-26 ENCOUNTER — Encounter: Payer: Self-pay | Admitting: Cardiology

## 2023-06-26 ENCOUNTER — Ambulatory Visit: Attending: Cardiology | Admitting: Cardiology

## 2023-06-26 VITALS — BP 110/58 | HR 54 | Ht 65.0 in | Wt 162.0 lb

## 2023-06-26 DIAGNOSIS — R0602 Shortness of breath: Secondary | ICD-10-CM | POA: Diagnosis not present

## 2023-06-26 DIAGNOSIS — Z794 Long term (current) use of insulin: Secondary | ICD-10-CM

## 2023-06-26 DIAGNOSIS — I5032 Chronic diastolic (congestive) heart failure: Secondary | ICD-10-CM

## 2023-06-26 DIAGNOSIS — E118 Type 2 diabetes mellitus with unspecified complications: Secondary | ICD-10-CM

## 2023-06-26 DIAGNOSIS — R5383 Other fatigue: Secondary | ICD-10-CM

## 2023-06-26 DIAGNOSIS — I739 Peripheral vascular disease, unspecified: Secondary | ICD-10-CM

## 2023-06-26 DIAGNOSIS — E785 Hyperlipidemia, unspecified: Secondary | ICD-10-CM

## 2023-06-26 DIAGNOSIS — I2510 Atherosclerotic heart disease of native coronary artery without angina pectoris: Secondary | ICD-10-CM | POA: Diagnosis not present

## 2023-06-26 DIAGNOSIS — Z951 Presence of aortocoronary bypass graft: Secondary | ICD-10-CM

## 2023-06-26 DIAGNOSIS — I10 Essential (primary) hypertension: Secondary | ICD-10-CM

## 2023-06-26 MED ORDER — METOPROLOL TARTRATE 25 MG PO TABS: 12.5000 mg | ORAL_TABLET | Freq: Two times a day (BID) | ORAL | 2 refills | Status: DC

## 2023-06-26 NOTE — Patient Instructions (Signed)
 Medication Instructions:  Your physician recommends the following medication changes.  STOP TAKING: Colchicine 0.6mg      DECREASE: Metoprolol to 12.5MG  Twice a daily   *If you need a refill on your cardiac medications before your next appointment, please call your pharmacy*  Lab Work: Your provider would like for you to have following labs drawn today CBC, BMP, DIRECT LDL.   If you have labs (blood work) drawn today and your tests are completely normal, you will receive your results only by: MyChart Message (if you have MyChart) OR A paper copy in the mail If you have any lab test that is abnormal or we need to change your treatment, we will call you to review the results.  Testing/Procedures: No test ordered today   Follow-Up: At [facility], you and your health needs are our priority.  As part of our continuing mission to provide you with exceptional heart care, our providers are all part of one team.  This team includes your primary Cardiologist (physician) and Advanced Practice Providers or APPs (Physician Assistants and Nurse Practitioners) who all work together to provide you with the care you need, when you need it.  Your next appointment:   4 week(s)  Provider:   You may see Constancia Delton, MD or one of the following Advanced Practice Providers on your designated Care Team:   Laneta Pintos, NP Gildardo Labrador, PA-C Varney Gentleman, PA-C Cadence Parlier, PA-C Ronald Cockayne, NP Morey Ar, NP    We recommend signing up for the patient portal called "MyChart".  Sign up information is provided on this After Visit Summary.  MyChart is used to connect with patients for Virtual Visits (Telemedicine).  Patients are able to view lab/test results, encounter notes, upcoming appointments, etc.  Non-urgent messages can be sent to your provider as well.   To learn more about what you can do with MyChart, go to [email or web address].

## 2023-06-26 NOTE — Progress Notes (Signed)
 Cardiology Office Note:  .   Date:  06/26/2023  ID:  Ferdie Housekeeper, DOB 1950-11-26, MRN [id number] PCP: Macie Saxon, MD  [facility] Providers Cardiologist:  Constancia Delton, MD    History of Present Illness: .   Erica Estrada is a 73 y.o. female with a past medical history of coronary artery disease status post CABG x 3 (12/30/2022), hypertension, type 2 diabetes, former smoker x 20+ years, hyperlipidemia, anemia, who presents today for follow-up of her coronary artery disease.   Previous echocardiogram revealed LVEF 60-65%.  Lexiscan Myoview did not show any ischemia.  CT angio of the chest in 11/22 showed three-vessel coronary artery calcifications.  Repeat Lexiscan 09/2022 showed findings consistent with ischemia on the study was an indeterminate risk. Left heart catheterization completed 11/27/2022 revealed severe three-vessel coronary artery disease, normal left heart filling pressures, mildly elevated right heart and pulmonary artery pressures with normal Fick cardiac output/index.  She was referred to cardiac surgery for CABG evaluation in the setting of three-vessel coronary artery disease and diabetes.  Her isosorbide mononitrate was increased to 30 mg daily for additional antianginal therapy and continue with aggressive secondary prevention of coronary artery disease. Echocardiogram 11/27/2022 showed ejection fraction 55 to 60%, G1 DD, mild mitral regurgitation.  She was considered stable for discharge and was discharged home on 11/27/2022.  She was to return to [facility] on Thursday 12/13/22 for surgery.  Patient underwent coronary artery bypass grafting x 3 with LIMA-> LAD, SVG-> PDA, SVG-> diagonal.  Saphenous vein harvest was completed from the right lower extremity.  There was spontaneous return of sinus rhythm after cross-clamp removal with the time of 77 minutes.  The distal and proximal anastomosis were checked for hemostasis.  Position of the graft was  satisfactory.  She had 2 temporary epicardial pacing wires that were placed in the right atrium and 2 on the right ventricle.  The patient was weaned from CPB without difficulty on no inotropes.  Cardiac output was 5 L/min.  TEE showed normal LV systolic function.  Heparin was fully reversed with protamine and the aortic and venous cannulas were removed.  Hemostasis was achieved.  Mediastinal and bilateral pleural drainage tubes were placed the sternum was closed in the usual fashion.  She was transported to the surgical intensive care unit in stable condition.  She did not require inotropic/pressor support postoperatively.  She underwent removal of mediastinal chest tubes, arterial line, and Swan on postop day 1.  She was transitioned off of insulin drip to Levemir and sliding scale coverage.  She was on Jardiance, insulin, and metformin prior to surgery.  Metformin was restarted on postop day 4.  Jardiance was needed to be restarted at discharge.  Her preop hemoglobin A1c was 8.2.  Which would require close medical follow-up after discharge.  She remained in sinus rhythm and was stable for transfer to progressive care unit on 12/16/2022.  PT and OT evaluations were obtained and they recommended home health PT and OT.  She needed a lot of encouragement to ambulate.  She had mild volume overload and was diuresed accordingly.  Epicardial pacing wires were removed on 12/18/2022.  She had increased appetite was tolerated the diet and with administration of laxatives was able to have a bowel movement.  She was able to ambulate on room air with good oxygenation.  All her wounds were clean, dry, and healing without signs of infection.  Ambulation improved and she was felt stable for discharge  on 12/20/22.she presented back to [facility] emergency department 12/24/2010 with complaint of chest pain.  High-sensitivity troponin 45 and 46.  There was concern for pericarditis and she was treated with colchicine and ibuprofen  repeat echocardiogram revealed LVEF of 55 to 60%, G1 DD, some regional wall motion abnormalities were noted suggestive of postoperative septal changes. Without concerns for valvular abnormalities. Sed rate was elevated at 92. She had noted improvement with NSAIDs and anti-inflammatories and was able to be discharged on 12/26/2022.  She had recently been evaluated in the ER in [facility] in February 2025 with complaints of right-sided chest pain.  Workup was unrevealing.  She had also been scheduled for ABIs with pain and burning to her bilateral lower extremities without swelling.    She was last seen in clinic 05/15/2023.  States she continued to have some shortness of breath on exertion and states it is bothersome but improves with rest.  She occasionally and swelling to bilateral lower extremities.  Continues to complain of burning into the bottom of her feet and had recent ABIs that were completed that revealed minimal disease.  She was advised that she can stop taking her colchicine for presumed pericarditis and was scheduled for repeat labs.  She returns to clinic today with continued concerns of shortness of breath that is unchanged since the last time she was seen in clinic but she is complaining of worsening fatigue today.  She continues to have some occasional swelling to her bilateral lower extremities complains of burning to the bottom of her feet with recent ABIs being completed that only revealed minimal disease.  She states that she does have an increase in his sodium intake due to the cost of food.  States her weights have remained stable.  Denies any recurrent chest pain, palpitations, lightheadedness or dizziness.  States she has been compliant with her current medication regimen.  Denies any hospitalizations or visits to the emergency department.  ROS: 10 point review of systems has been reviewed and considered negative except ones are listed in the HPI  Studies Reviewed: .       2D echo  05/09/2023 1. Left ventricular ejection fraction, by estimation, is 55 to 60%. The  left ventricle has normal function. The left ventricle has no regional  wall motion abnormalities. Left ventricular diastolic parameters are  consistent with Grade I diastolic  dysfunction (impaired relaxation).   2. Right ventricular systolic function is normal. The right ventricular  size is normal. Tricuspid regurgitation signal is inadequate for assessing  PA pressure.   3. The mitral valve is normal in structure. Trivial mitral valve  regurgitation. No evidence of mitral stenosis.   4. The aortic valve is normal in structure. There is mild calcification  of the aortic valve. Aortic valve regurgitation is not visualized. No  aortic stenosis is present.   5. The inferior vena cava is normal in size with greater than 50%  respiratory variability, suggesting right atrial pressure of 3 mmHg.    TTE 12/25/22 1. Left ventricular ejection fraction, by estimation, is 55 to 60%. The  left ventricle has normal function. The left ventricle demonstrates  regional wall motion abnormalities (see scoring diagram/findings for  description). Left ventricular diastolic  parameters are consistent with Grade I diastolic dysfunction (impaired  relaxation). Wall motion suggestive of post-operative septal changes.   2. Right ventricular systolic function is normal. The right ventricular  size is normal. There is normal pulmonary artery systolic pressure.   3. The mitral  valve is normal in structure. No evidence of mitral valve  regurgitation. No evidence of mitral stenosis.   4. The aortic valve is tricuspid. There is mild calcification of the  aortic valve. There is mild thickening of the aortic valve. Aortic valve  regurgitation is trivial. Aortic valve sclerosis is present, with no  evidence of aortic valve stenosis.   5. Cannot exclude a small PFO.    TEE Intraoperative 12/13/22 _ Left Ventricle: The left ventricle  is unchanged from pre-bypass. has  normal  systolic function, with an ejection fraction of 60%. The cavity size was  normal.  The wall motion is normal.  _ Right Ventricle: The right ventricle appears unchanged from pre-bypass  normal  function.  _ Aorta: The aorta appears unchanged from pre-bypass.  _ Left Atrial Appendage: The left atrial appendage appears unchanged from  pre-bypass.  _ Aortic Valve: The aortic valve appears unchanged from pre-bypass. No  stenosis  present. There is no regurgitation.  _ Mitral Valve: There is trivial-mild regurgitation.  _ Tricuspid Valve: There is mild regurgitation.  _ Pulmonic Valve: The pulmonic valve appears unchanged from pre-bypass.  _ Interatrial Septum: The interatrial septum appears unchanged from  pre-bypass.  _ Pericardium: The pericardium appears unchanged from pre-bypass.  _ Comments: Post-bypass images reviewed with surgeon.    TTE 11/27/22 1. Left ventricular ejection fraction, by estimation, is 55 to 60%. The  left ventricle has normal function. The left ventricle has no regional  wall motion abnormalities. Left ventricular diastolic parameters are  consistent with Grade I diastolic  dysfunction (impaired relaxation).   2. Right ventricular systolic function is normal. The right ventricular  size is normal. Tricuspid regurgitation signal is inadequate for assessing  PA pressure.   3. The mitral valve is degenerative. Mild mitral valve regurgitation. No  evidence of mitral stenosis.   4. The aortic valve is tricuspid. There is mild thickening of the aortic  valve. Aortic valve regurgitation is not visualized. Aortic valve  sclerosis is present, with no evidence of aortic valve stenosis.    R/LHC 11/27/22 Conclusions: Severe three-vessel coronary artery disease, as detailed below. Normal left heart filling pressures. Mildly elevated right heart and pulmonary artery pressures. Normal Fick cardiac output/index.    Recommendations: Refer to cardiac surgery for CABG evaluation in the setting of three-vessel coronary artery disease and diabetes mellitus. Obtain echocardiogram prior to discharge today. Increase isosorbide mononitrate to 30 mg daily for additional antianginal therapy. Aggressive secondary prevention of coronary artery disease. Lexiscan MPI 10/09/22   Findings are consistent with ischemia. The study is intermediate risk.   No ST deviation was noted.   LV perfusion is abnormal. There is evidence of ischemia. There is evidence of infarction. Defect 1: There is a medium defect with moderate reduction in uptake present in the mid to basal anterior location(s) that is partially reversible. There is normal wall motion in the defect area. Consistent with ischemia.   Left ventricular function is normal. End diastolic cavity size is normal. End systolic cavity size is normal.   Unfortunately, this is a very suboptimal study due to intense GI uptake at the border of the inferior wall.  The anterior wall reversible defect might be due to artifact.   CT attenuation images show evidence of aortic and coronary calcifications.   12/2/2022Sal Crass MPI Pharmacological myocardial perfusion imaging study with no significant  ischemia Normal wall motion, EF estimated at 68% No EKG changes concerning for ischemia at peak stress or in recovery.  CT attenuation correction images with mild aortic atherosclerosis, mild coronary calcification Low risk scan   01/22/21-TTE 1. Left ventricular ejection fraction, by estimation, is 60 to 65%. The  left ventricle has normal function. The left ventricle has no regional  wall motion abnormalities. Left ventricular diastolic parameters were  normal.   2. Right ventricular systolic function is normal. The right ventricular  size is normal.   3. The mitral valve is normal in structure. Trivial mitral valve  regurgitation.   4. The aortic valve is normal in structure.  Aortic valve regurgitation is  not visualized.  Risk Assessment/Calculations:             Physical Exam:   VS:  BP (!) 110/58 (BP Location: Left Arm)   Pulse (!) 54   Ht 5\' 5"  (1.651 m)   Wt 162 lb (73.5 kg)   SpO2 99%   BMI 26.96 kg/m    Wt Readings from Last 3 Encounters:  06/26/23 162 lb (73.5 kg)  05/15/23 162 lb 8 oz (73.7 kg)  04/22/23 160 lb (72.6 kg)    GEN: Well nourished, well developed in no acute distress NECK: No JVD; No carotid bruits CARDIAC: RRR, no murmurs, rubs, gallops RESPIRATORY:  Clear with diminished bases to auscultation without rales, wheezing or rhonchi  ABDOMEN: Soft, non-tender, non-distended EXTREMITIES:  No edema; No deformity   ASSESSMENT AND PLAN: .   Coronary artery disease status post CABG x 3 (12/13/2022).  Denies any anginal.  She has been continued on aspirin 81 mg daily, atorvastatin 80 mg daily, Imdur 30 mg daily.  She previously been placed on colchicine for presumed pericarditis during hospitalization.  This medication has been discontinued today.  No further ischemic testing at this time.  HFpEF with chronic dyspnea on exertion with last echocardiogram revealing LVEF 55 to 60%, no RWMA, G1 DD, right ventricular systolic function and size were normal, trivial mitral regurgitation, mild calcification of aortic valve no findings suggestive of her dyspnea.  She was recently started on furosemide at home for swelling is being sent for an updated BMP to recheck kidney function and electrolytes as well as CBC to rule out anemia today.  With her complaints of fatigue we are decreasing her metoprolol to 12.5 mg twice daily and consider changing her from metoprolol to Toprol-XL but she just recently picked her metoprolol tartrate prescription at home on 06/13/2023.  Will consider consolidating and changing her to Toprol-XL on return.  She is continued on Jardiance 25 mg daily and will consider starting spironolactone after updated lab work has  resulted.  Primary hypertension with blood pressure today 110/58.  Blood pressures remain stable to slightly lower with her normal today.  Metoprolol tartrate has been decreased to 12.5 mg twice daily and she is continued on amlodipine 10 mg daily as well as Imdur 30 mg daily.  She has been encouraged to continue to monitor blood pressures 1 to 2 hours postmedication administration as well.  Mixed hyperlipidemia with last LDL of 47 which remains less than: 55.  She is continued on atorvastatin 80 mg daily with a direct LDL being drawn today.  Type 2 diabetes with a hemoglobin A1c of 7.8.  She is continued on, insulin, Tresiba, metformin.  Ongoing management per PCP.  Intermittent claudication she continues to complain of burning and discomfort to bilateral lower extremities.  ABIs revealed only mild disease with recommendation to follow-up in a year.  She has been advised to follow-up with her PCP for  workup of neuropathy.  Fatigue likely multifactorial status post CABG with sedentary lifestyle.  Patient was not able to participate in cardiac rehab due to transportation issues.  With ongoing complaints of fatigue beta-blocker therapy has been reduced today.  She was noted to have heart rate of 58 today on exam.       Dispo: Patient return to clinic as MD/APP in 3 to 4 weeks or sooner if needed for reevaluation of symptoms.  Signed, Mahkai Fangman, NP

## 2023-06-27 NOTE — Progress Notes (Signed)
 Kidney function is slightly elevated.  Increase oral hydration.  Recommend holding furosemide on unless she has swelling or weight gain.  Also advised to take potassium 20 mEq today and 20 mEq tomorrow due to low potassium level on blood work and repeat BMP in 1 week.  LDL is increased continue atorvastatin 80 mg daily and ezetimibe 10 mg daily with a repeat lipid and hepatic panel in 3 months.

## 2023-06-28 LAB — BASIC METABOLIC PANEL WITH GFR
BUN/Creatinine Ratio: 14 (ref 12–28)
BUN: 15 mg/dL (ref 8–27)
CO2: 20 mmol/L (ref 20–29)
Calcium: 9.1 mg/dL (ref 8.7–10.3)
Chloride: 103 mmol/L (ref 96–106)
Creatinine, Ser: 1.06 mg/dL — ABNORMAL HIGH (ref 0.57–1.00)
Glucose: 175 mg/dL — ABNORMAL HIGH (ref 70–99)
Potassium: 3.4 mmol/L — ABNORMAL LOW (ref 3.5–5.2)
Sodium: 143 mmol/L (ref 134–144)
eGFR: 55 mL/min/{1.73_m2} — ABNORMAL LOW (ref >59)

## 2023-06-28 LAB — CBC
Hematocrit: 36.1 % (ref 34.0–46.6)
Hemoglobin: 11.7 g/dL (ref 11.1–15.9)
MCH: 28.8 pg (ref 26.6–33.0)
MCHC: 32.4 g/dL (ref 31.5–35.7)
MCV: 89 fL (ref 79–97)
Platelets: 295 10*3/uL (ref 150–450)
RBC: 4.06 x10E6/uL (ref 3.77–5.28)
RDW: 13.5 % (ref 11.7–15.4)
WBC: 11.6 10*3/uL — ABNORMAL HIGH (ref 3.4–10.8)

## 2023-06-28 LAB — LDL CHOLESTEROL, DIRECT: LDL Direct: 86 mg/dL (ref 0–99)

## 2023-07-02 ENCOUNTER — Encounter: Payer: Self-pay | Admitting: *Deleted

## 2023-07-02 NOTE — Progress Notes (Signed)
 CBC has remained stable.  Continue current medication regimen at this time without changes.

## 2023-07-04 ENCOUNTER — Telehealth: Payer: Self-pay | Admitting: Cardiology

## 2023-07-04 DIAGNOSIS — E785 Hyperlipidemia, unspecified: Secondary | ICD-10-CM

## 2023-07-04 DIAGNOSIS — I2510 Atherosclerotic heart disease of native coronary artery without angina pectoris: Secondary | ICD-10-CM

## 2023-07-04 MED ORDER — EZETIMIBE 10 MG PO TABS: 10.0000 mg | ORAL_TABLET | Freq: Every day | ORAL | 3 refills | Status: AC

## 2023-07-04 NOTE — Telephone Encounter (Signed)
 Patient made aware of results and verbalized understanding. Patient will come next Friday for labs. Zetia  10 mg once daily has been sent in for her.    Per Ronald Cockayne, NP: Kidney function is slightly elevated.  Increase oral hydration.  Recommend holding furosemide  on unless she has swelling or weight gain.  Also advised to take potassium 20 mEq today and 20 mEq tomorrow due to low potassium level on blood work and repeat BMP in 1 week.  LDL is increased continue atorvastatin  80 mg daily and ezetimibe  10 mg daily with a repeat lipid and hepatic panel in 3 months.

## 2023-07-04 NOTE — Telephone Encounter (Signed)
Follow Up:       Patient is returning a call from this morning, concerning her results.

## 2023-07-11 ENCOUNTER — Ambulatory Visit (INDEPENDENT_AMBULATORY_CARE_PROVIDER_SITE_OTHER): Payer: 59 | Admitting: Podiatry

## 2023-07-11 DIAGNOSIS — Z91199 Patient's noncompliance with other medical treatment and regimen due to unspecified reason: Secondary | ICD-10-CM

## 2023-07-11 NOTE — Progress Notes (Signed)
 1. No-show for appointment    No show #2.

## 2023-08-07 ENCOUNTER — Encounter: Payer: Self-pay | Admitting: Cardiology

## 2023-08-07 ENCOUNTER — Ambulatory Visit: Attending: Cardiology | Admitting: Cardiology

## 2023-08-07 VITALS — BP 104/60 | HR 62 | Ht 65.0 in | Wt 164.0 lb

## 2023-08-07 DIAGNOSIS — I2510 Atherosclerotic heart disease of native coronary artery without angina pectoris: Secondary | ICD-10-CM

## 2023-08-07 DIAGNOSIS — I10 Essential (primary) hypertension: Secondary | ICD-10-CM | POA: Diagnosis not present

## 2023-08-07 DIAGNOSIS — D500 Iron deficiency anemia secondary to blood loss (chronic): Secondary | ICD-10-CM

## 2023-08-07 DIAGNOSIS — E785 Hyperlipidemia, unspecified: Secondary | ICD-10-CM | POA: Diagnosis not present

## 2023-08-07 DIAGNOSIS — Z951 Presence of aortocoronary bypass graft: Secondary | ICD-10-CM | POA: Diagnosis not present

## 2023-08-07 DIAGNOSIS — G63 Polyneuropathy in diseases classified elsewhere: Secondary | ICD-10-CM

## 2023-08-07 DIAGNOSIS — E118 Type 2 diabetes mellitus with unspecified complications: Secondary | ICD-10-CM

## 2023-08-07 DIAGNOSIS — Z794 Long term (current) use of insulin: Secondary | ICD-10-CM

## 2023-08-07 NOTE — Patient Instructions (Signed)
 Medication Instructions:  Your physician recommends that you continue on your current medications as directed. Please refer to the Current Medication list given to you today.   *If you need a refill on your cardiac medications before your next appointment, please call your pharmacy*  Lab Work: No labs ordered today   Testing/Procedures: No test ordered today   Follow-Up: At [facility], you and your health needs are our priority.  As part of our continuing mission to provide you with exceptional heart care, our providers are all part of one team.  This team includes your primary Cardiologist (physician) and Advanced Practice Providers or APPs (Physician Assistants and Nurse Practitioners) who all work together to provide you with the care you need, when you need it.  Your next appointment:   6 month(s)  Provider:   You may see Constancia Delton, MD or one of the following Advanced Practice Providers on your designated Care Team:   Laneta Pintos, NP Gildardo Labrador, PA-C Varney Gentleman, PA-C Cadence Northvale, PA-C Ronald Cockayne, NP Morey Ar, NP

## 2023-08-07 NOTE — Progress Notes (Signed)
 Cardiology Office Note:  .   Date:  08/07/2023  ID:  Ferdie Housekeeper, DOB 30-Mar-1950, MRN [id number] PCP: Macie Saxon, MD  [facility] Providers Cardiologist:  Constancia Delton, MD    History of Present Illness: .   Erica Estrada is a 73 y.o. female with a past medical history of coronary artery disease status post CABG x 3 (12/30/2022), hypertension, type 2 diabetes, former smoker x 20+ years, hyperlipidemia, anemia, who presents today for follow-up of coronary artery disease.   Previous echocardiogram revealed LVEF 60-65%.  Lexiscan  Myoview  did not show any ischemia.  CT angio of the chest in 11/22 showed three-vessel coronary artery calcifications.  Repeat Lexiscan  09/2022 showed findings consistent with ischemia on the study was an indeterminate risk. Left heart catheterization completed 11/27/2022 revealed severe three-vessel coronary artery disease, normal left heart filling pressures, mildly elevated right heart and pulmonary artery pressures with normal Fick cardiac output/index.  She was referred to cardiac surgery for CABG evaluation in the setting of three-vessel coronary artery disease and diabetes.  Her isosorbide  mononitrate was increased to 30 mg daily for additional antianginal therapy and continue with aggressive secondary prevention of coronary artery disease. Echocardiogram 11/27/2022 showed ejection fraction 55 to 60%, G1 DD, mild mitral regurgitation.  She was considered stable for discharge and was discharged home on 11/27/2022.  She was to return to [facility] on Thursday 12/13/22 for surgery.   Patient underwent coronary artery bypass grafting x 3 with LIMA-> LAD, SVG-> PDA, SVG-> diagonal.  Saphenous vein harvest was completed from the right lower extremity.  There was spontaneous return of sinus rhythm after cross-clamp removal with the time of 77 minutes.  The distal and proximal anastomosis were checked for hemostasis.  Position of the graft was  satisfactory.  She had 2 temporary epicardial pacing wires that were placed in the right atrium and 2 on the right ventricle.  The patient was weaned from CPB without difficulty on no inotropes.  Cardiac output was 5 L/min.  TEE showed normal LV systolic function.  Heparin  was fully reversed with protamine  and the aortic and venous cannulas were removed.  Hemostasis was achieved.  Mediastinal and bilateral pleural drainage tubes were placed the sternum was closed in the usual fashion.  She was transported to the surgical intensive care unit in stable condition.  She did not require inotropic/pressor support postoperatively.  She underwent removal of mediastinal chest tubes, arterial line, and Swan on postop day 1.  She was transitioned off of insulin  drip to Levemir  and sliding scale coverage.  She was on Jardiance, insulin , and metformin  prior to surgery.  Metformin  was restarted on postop day 4.  Jardiance was needed to be restarted at discharge.  Her preop hemoglobin A1c was 8.2.  Which would require close medical follow-up after discharge.  She remained in sinus rhythm and was stable for transfer to progressive care unit on 12/16/2022.  PT and OT evaluations were obtained and they recommended home health PT and OT.  She needed a lot of encouragement to ambulate.  She had mild volume overload and was diuresed accordingly.  Epicardial pacing wires were removed on 12/18/2022.  She had increased appetite was tolerated the diet and with administration of laxatives was able to have a bowel movement.  She was able to ambulate on room air with good oxygenation.  All her wounds were clean, dry, and healing without signs of infection.  Ambulation improved and she was felt stable for discharge  on 12/20/22.she presented back to [facility] emergency department 12/24/2010 with complaint of chest pain.  High-sensitivity troponin 45 and 46.  There was concern for pericarditis and she was treated with colchicine  and ibuprofen   repeat echocardiogram revealed LVEF of 55 to 60%, G1 DD, some regional wall motion abnormalities were noted suggestive of postoperative septal changes. Without concerns for valvular abnormalities. Sed rate was elevated at 92. She had noted improvement with NSAIDs and anti-inflammatories and was able to be discharged on 12/26/2022.  She had recently been evaluated in the ER in [facility] in February 2025 with complaints of right-sided chest pain.  Workup was unrevealing.  She had also been scheduled for ABIs with pain and burning to her bilateral lower extremities without swelling.    She was last seen in clinic 06/26/23 with continued concerns of shortness of breath with changes on last visit, worsening fatigue, occasional swelling to her bilateral lower extremities.  States that her weights have remained stable.  Denies any recurrent chest pain, palpitations, lightheadedness.  States she has been compliant with her current medication regimen, stating that her medications a bubble packed for convenience.   She returns to clinic today accompanied by family member.  She states that she has been doing fairly well from a cardiac perspective.  She continues to complain of leg pain and burning and tingling to the bottom of her feet from neuropathy.  She also complains of nerve discomfort to the top of her scar from her CABG.  She denies any recurrent chest pain, worsening shortness of breath, peripheral edema, or palpitations.  States that she has been compliant with her current medication regimen without any undue side effects.  Denies any recent hospitalizations or visits to the emergency department.  She does complain of worsening foot pain on her right foot today stating that she needs her toenails cut and her ingrown toenails taking care of both her diabetes she has to go to podiatry and unfortunately she states she missed her last appointment and her appointment has now been rescheduled for later in  June.  ROS: 10 point review of systems has been reviewed and considered negative except ones been listed in the HPI  Studies Reviewed: .        2D echo 05/09/2023 1. Left ventricular ejection fraction, by estimation, is 55 to 60%. The  left ventricle has normal function. The left ventricle has no regional  wall motion abnormalities. Left ventricular diastolic parameters are  consistent with Grade I diastolic  dysfunction (impaired relaxation).   2. Right ventricular systolic function is normal. The right ventricular  size is normal. Tricuspid regurgitation signal is inadequate for assessing  PA pressure.   3. The mitral valve is normal in structure. Trivial mitral valve  regurgitation. No evidence of mitral stenosis.   4. The aortic valve is normal in structure. There is mild calcification  of the aortic valve. Aortic valve regurgitation is not visualized. No  aortic stenosis is present.   5. The inferior vena cava is normal in size with greater than 50%  respiratory variability, suggesting right atrial pressure of 3 mmHg.    TTE 12/25/22 1. Left ventricular ejection fraction, by estimation, is 55 to 60%. The  left ventricle has normal function. The left ventricle demonstrates  regional wall motion abnormalities (see scoring diagram/findings for  description). Left ventricular diastolic  parameters are consistent with Grade I diastolic dysfunction (impaired  relaxation). Wall motion suggestive of post-operative septal changes.   2. Right ventricular  systolic function is normal. The right ventricular  size is normal. There is normal pulmonary artery systolic pressure.   3. The mitral valve is normal in structure. No evidence of mitral valve  regurgitation. No evidence of mitral stenosis.   4. The aortic valve is tricuspid. There is mild calcification of the  aortic valve. There is mild thickening of the aortic valve. Aortic valve  regurgitation is trivial. Aortic valve sclerosis is  present, with no  evidence of aortic valve stenosis.   5. Cannot exclude a small PFO.    TEE Intraoperative 12/13/22 _ Left Ventricle: The left ventricle is unchanged from pre-bypass. has  normal  systolic function, with an ejection fraction of 60%. The cavity size was  normal.  The wall motion is normal.  _ Right Ventricle: The right ventricle appears unchanged from pre-bypass  normal  function.  _ Aorta: The aorta appears unchanged from pre-bypass.  _ Left Atrial Appendage: The left atrial appendage appears unchanged from  pre-bypass.  _ Aortic Valve: The aortic valve appears unchanged from pre-bypass. No  stenosis  present. There is no regurgitation.  _ Mitral Valve: There is trivial-mild regurgitation.  _ Tricuspid Valve: There is mild regurgitation.  _ Pulmonic Valve: The pulmonic valve appears unchanged from pre-bypass.  _ Interatrial Septum: The interatrial septum appears unchanged from  pre-bypass.  _ Pericardium: The pericardium appears unchanged from pre-bypass.  _ Comments: Post-bypass images reviewed with surgeon.    TTE 11/27/22 1. Left ventricular ejection fraction, by estimation, is 55 to 60%. The  left ventricle has normal function. The left ventricle has no regional  wall motion abnormalities. Left ventricular diastolic parameters are  consistent with Grade I diastolic  dysfunction (impaired relaxation).   2. Right ventricular systolic function is normal. The right ventricular  size is normal. Tricuspid regurgitation signal is inadequate for assessing  PA pressure.   3. The mitral valve is degenerative. Mild mitral valve regurgitation. No  evidence of mitral stenosis.   4. The aortic valve is tricuspid. There is mild thickening of the aortic  valve. Aortic valve regurgitation is not visualized. Aortic valve  sclerosis is present, with no evidence of aortic valve stenosis.    R/LHC 11/27/22 Conclusions: Severe three-vessel coronary artery disease, as detailed  below. Normal left heart filling pressures. Mildly elevated right heart and pulmonary artery pressures. Normal Fick cardiac output/index.   Recommendations: Refer to cardiac surgery for CABG evaluation in the setting of three-vessel coronary artery disease and diabetes mellitus. Obtain echocardiogram prior to discharge today. Increase isosorbide  mononitrate to 30 mg daily for additional antianginal therapy. Aggressive secondary prevention of coronary artery disease. Lexiscan  MPI 10/09/22   Findings are consistent with ischemia. The study is intermediate risk.   No ST deviation was noted.   LV perfusion is abnormal. There is evidence of ischemia. There is evidence of infarction. Defect 1: There is a medium defect with moderate reduction in uptake present in the mid to basal anterior location(s) that is partially reversible. There is normal wall motion in the defect area. Consistent with ischemia.   Left ventricular function is normal. End diastolic cavity size is normal. End systolic cavity size is normal.   Unfortunately, this is a very suboptimal study due to intense GI uptake at the border of the inferior wall.  The anterior wall reversible defect might be due to artifact.   CT attenuation images show evidence of aortic and coronary calcifications.   02/10/2021- Lexiscan  MPI Pharmacological myocardial perfusion imaging study with  no significant  ischemia Normal wall motion, EF estimated at 68% No EKG changes concerning for ischemia at peak stress or in recovery. CT attenuation correction images with mild aortic atherosclerosis, mild coronary calcification Low risk scan   01/22/21-TTE 1. Left ventricular ejection fraction, by estimation, is 60 to 65%. The  left ventricle has normal function. The left ventricle has no regional  wall motion abnormalities. Left ventricular diastolic parameters were  normal.   2. Right ventricular systolic function is normal. The right ventricular  size is  normal.   3. The mitral valve is normal in structure. Trivial mitral valve  regurgitation.   4. The aortic valve is normal in structure. Aortic valve regurgitation is  not visualized.  Risk Assessment/Calculations:             Physical Exam:   VS:  BP 104/60   Pulse 62   Ht 5\' 5"  (1.651 m)   Wt 164 lb (74.4 kg)   SpO2 94%   BMI 27.29 kg/m    Wt Readings from Last 3 Encounters:  08/07/23 164 lb (74.4 kg)  06/26/23 162 lb (73.5 kg)  05/15/23 162 lb 8 oz (73.7 kg)    GEN: Well nourished, well developed in no acute distress NECK: No JVD; No carotid bruits CARDIAC: RRR, no murmurs, rubs, gallops RESPIRATORY:  Clear to auscultation without rales, wheezing or rhonchi  ABDOMEN: Soft, non-tender, non-distended EXTREMITIES:  No edema; No deformity   ASSESSMENT AND PLAN: .   Coronary artery disease status post CABG x 3 (12/30/2022).  She continues to complain of nerve discomfort but not chest pain.  She has been continued on aspirin  81 mg daily, atorvastatin  80 mg daily, ezetimibe  10 mg daily, and metoprolol  tartrate 25 milligrams twice daily for 30 mg twice daily.  No further ischemic testing needed at this time.  Primary hypertension with blood pressure today 104/60.  Blood pressure has remained stable.  She is continued on metoprolol  tartrate 25 mg twice daily, Imdur  30 mg twice daily, and amlodipine  10 mg daily.  She has been encouraged to continue to monitor her blood pressure at home 1 to 2 hours postmedication administration as well.  Mixed hyperlipidemia with last LDL direct 86 which is no longer at goal of less than 55.  She is continued on atorvastatin  80 mg daily and ezetimibe  10 mg daily.Will need a repeat lipid and hepatic panel on return.   Type 2 diabetes class hemoglobin A1c is 7.8.  She is continue Jardiance, insulin , Tresiba , metformin , this continues to be managed by her PCP.  Peripheral neuropathy patient continues to be on Neurontin .  She continues to complain of pain  and burning to the bottom of both of her feet.  Ongoing management per PCP.  Anemia has resolved a little hemoglobin 11.7.  Will continue to monitor on occasion with CBCs.  Patient denies any bleeding with no blood noted in her urine or stool.       Dispo: Patient to return to clinic to see MD/APP in 6 months or sooner if needed for reevaluation of current symptoms  Signed, Laurelin Elson, NP

## 2023-08-08 ENCOUNTER — Ambulatory Visit: Payer: Self-pay | Admitting: Cardiology

## 2023-08-08 LAB — BASIC METABOLIC PANEL WITH GFR
BUN/Creatinine Ratio: 18 (ref 12–28)
BUN: 15 mg/dL (ref 8–27)
CO2: 23 mmol/L (ref 20–29)
Calcium: 9.8 mg/dL (ref 8.7–10.3)
Chloride: 102 mmol/L (ref 96–106)
Creatinine, Ser: 0.83 mg/dL (ref 0.57–1.00)
Glucose: 195 mg/dL — ABNORMAL HIGH (ref 70–99)
Potassium: 5 mmol/L (ref 3.5–5.2)
Sodium: 143 mmol/L (ref 134–144)
eGFR: 74 mL/min/{1.73_m2} (ref >59)

## 2023-08-08 NOTE — Progress Notes (Signed)
 Kidney function has returned to baseline and potassium is high normal.  Ensure that she is only taking potassium supplements when she takes her as needed furosemide .  She no longer needs to take potassium daily.

## 2023-08-19 ENCOUNTER — Telehealth: Payer: Self-pay | Admitting: Cardiology

## 2023-08-19 NOTE — Telephone Encounter (Signed)
 Called patient, made aware of results, patient had questions regarding medications- we discussed and patient is now aware of what she should take and when.   Patient verbalized understanding

## 2023-08-19 NOTE — Telephone Encounter (Signed)
 Pt c/o medication issue:  1. Name of Medication:    furosemide  (LASIX ) 20 MG tablet    potassium chloride  SA (KLOR-CON  M) 20 MEQ tablet    2. How are you currently taking this medication (dosage and times per day)? See below  3. Are you having a reaction (difficulty breathing--STAT)? No   4. What is your medication issue? Pt called in back after received letter with results. She states she takes the furosemide  every night with her bubble pack. She states the potassium pill has been in her bubble pack. Please advise.

## 2023-08-22 ENCOUNTER — Ambulatory Visit (INDEPENDENT_AMBULATORY_CARE_PROVIDER_SITE_OTHER): Admitting: Podiatry

## 2023-08-22 ENCOUNTER — Encounter: Payer: Self-pay | Admitting: Podiatry

## 2023-08-22 DIAGNOSIS — M79674 Pain in right toe(s): Secondary | ICD-10-CM | POA: Diagnosis not present

## 2023-08-22 DIAGNOSIS — B351 Tinea unguium: Secondary | ICD-10-CM

## 2023-08-22 DIAGNOSIS — E1142 Type 2 diabetes mellitus with diabetic polyneuropathy: Secondary | ICD-10-CM

## 2023-08-22 DIAGNOSIS — M79675 Pain in left toe(s): Secondary | ICD-10-CM | POA: Diagnosis not present

## 2023-08-29 NOTE — Progress Notes (Signed)
  Subjective:  Patient ID: Erica Estrada, female    DOB: 12-Dec-1950,  MRN: [id number]  Erica Estrada presents to clinic today for at risk foot care with history of diabetic neuropathy and painful thick toenails that are difficult to trim. Pain interferes with ambulation. Aggravating factors include wearing enclosed shoe gear. Pain is relieved with periodic professional debridement.   New problem(s): None.   PCP is Macie Saxon, MD. LOV  Allergies  Allergen Reactions   Zoster Vac Recomb Adjuvanted     Other Reaction(s): cellulits   Ace Inhibitors Cough    Chronic cough for 3 months. Trial off lisinopril starting 05/11/2011 effective in stopping cough.   Lisinopril Other (See Comments)   Fluticasone  Other (See Comments)    choking    Review of Systems: Negative except as noted in the HPI.  Objective: No changes noted in today's physical examination. There were no vitals filed for this visit. Sofiah Estrada is a pleasant 73 y.o. female in NAD. AAO x 3.  Vascular Examination: CFT less than 3 seconds. DP/PT pulses 0/4 LLE. DP/ PT pulses palpable RLE. Digital hair absent. Skin temperature gradient warm to warn b/l. No ischemia or gangrene. No cyanosis or clubbing noted b/l.    Neurological Examination: Sensation grossly intact b/l with 10 gram monofilament. Vibratory sensation intact b/l. Pt has subjective symptoms of neuropathy.  Dermatological Examination: Pedal skin thin, shiny and atrophic b/l. No open wounds. No interdigital macerations.   Toenails 1-5 b/l thick, discolored, elongated with subungual debris and pain on dorsal palpation.   No corns, calluses nor porokeratotic lesions noted.  Musculoskeletal Examination: Muscle strength 5/5 to all lower extremity muscle groups bilaterally. No pain, crepitus or joint limitation noted with ROM bilateral LE. No gross bony deformities bilaterally.  Radiographs: None  Last A1c:      Latest Ref Rng & Units  12/24/2022   11:17 AM 12/11/2022    3:12 PM  Hemoglobin A1C  Hemoglobin-A1c 4.8 - 5.6 % 7.8  8.2     Assessment/Plan: 1. Pain due to onychomycosis of toenails of both feet   2. Diabetic peripheral neuropathy associated with type 2 diabetes mellitus [facility])    Consent given for treatment. Patient examined. All patient's and/or POA's questions/concerns addressed on today's visit. Mycotic toenails 1-5 debrided in length and girth without incident. Continue foot and shoe inspections daily. Monitor blood glucose per PCP/Endocrinologist's recommendations.Continue soft, supportive shoe gear daily. Report any pedal injuries to medical professional. Call office if there are any quesitons/concerns. -Patient/POA to call should there be question/concern in the interim.   Return in about 3 months (around 11/22/2023).  Erica Estrada, DPM      [facility] LOCATION: [address], [place] [address]                   Office [phone]   [facility] LOCATION: [address] [place], [place] [address] Office [phone]

## 2023-10-09 ENCOUNTER — Other Ambulatory Visit: Payer: Self-pay | Admitting: Cardiology

## 2023-10-10 ENCOUNTER — Other Ambulatory Visit: Payer: Self-pay | Admitting: Cardiology

## 2023-10-10 ENCOUNTER — Other Ambulatory Visit: Payer: Self-pay | Admitting: *Deleted

## 2023-11-05 ENCOUNTER — Other Ambulatory Visit: Payer: Self-pay | Admitting: Cardiology

## 2023-11-25 ENCOUNTER — Encounter: Payer: Self-pay | Admitting: Podiatry

## 2023-11-25 ENCOUNTER — Ambulatory Visit (INDEPENDENT_AMBULATORY_CARE_PROVIDER_SITE_OTHER): Admitting: Podiatry

## 2023-11-25 DIAGNOSIS — M79674 Pain in right toe(s): Secondary | ICD-10-CM

## 2023-11-25 DIAGNOSIS — B351 Tinea unguium: Secondary | ICD-10-CM

## 2023-11-25 DIAGNOSIS — M79675 Pain in left toe(s): Secondary | ICD-10-CM

## 2023-11-25 DIAGNOSIS — E1142 Type 2 diabetes mellitus with diabetic polyneuropathy: Secondary | ICD-10-CM | POA: Diagnosis not present

## 2023-11-25 NOTE — Progress Notes (Signed)
  Subjective:  Patient ID: Erica Estrada, female    DOB: 1951/01/02,  MRN: [id number]  Erica Estrada presents to clinic today for at risk foot care with history of diabetic neuropathy and painful mycotic toenails of both feet that are difficult to trim. Pain interferes with daily activities and wearing enclosed shoe gear comfortably.  Chief Complaint  Patient presents with   [facility]    Rm2 Diabetic foot care/ Dr. Rock Pounds    New problem(s): None.   PCP is [place] HERO, MD.  Allergies  Allergen Reactions   Zoster Vac Recomb Adjuvanted     Other Reaction(s): cellulits   Ace Inhibitors Cough    Chronic cough for 3 months. Trial off lisinopril starting 05/11/2011 effective in stopping cough.   Lisinopril Other (See Comments)   Fluticasone  Other (See Comments)    choking    Review of Systems: Negative except as noted in the HPI.  Objective: No changes noted in today's physical examination. There were no vitals filed for this visit. Erica Estrada is a pleasant 73 y.o. female WD, WN in NAD. AAO x 3.  Vascular Examination: CFT less than 3 seconds. DP/PT pulses 0/4 LLE. DP/ PT pulses palpable RLE. Digital hair absent. Skin temperature gradient warm to warn b/l. No ischemia or gangrene. No cyanosis or clubbing noted b/l.    Neurological Examination: Sensation grossly intact b/l with 10 gram monofilament. Vibratory sensation intact b/l. Pt has subjective symptoms of neuropathy.  Dermatological Examination: Pedal skin thin, shiny and atrophic b/l. No open wounds. No interdigital macerations.   Toenails 1-5 b/l thick, discolored, elongated with subungual debris and pain on dorsal palpation.   No corns, calluses nor porokeratotic lesions noted.  Musculoskeletal Examination: Muscle strength 5/5 to all lower extremity muscle groups bilaterally. No pain, crepitus or joint limitation noted with ROM bilateral LE. No gross bony deformities bilaterally.  Radiographs:  None  Assessment/Plan: 1. Pain due to onychomycosis of toenails of both feet   2. Diabetic peripheral neuropathy associated with type 2 diabetes mellitus [facility])   Consent given for treatment. Patient examined. All patient's and/or POA's questions/concerns addressed on today's visit. Treatment was provided by assistant Mable FABIENE Cherry under my supervision. Mycotic toenails 1-5 b/l debrided in length and girth without incident. Continue foot and shoe inspections daily. Monitor blood glucose per PCP/Endocrinologist's recommendations. Continue soft, supportive shoe gear daily. Report any pedal injuries to medical professional. Call office if there are any quesitons/concerns. Return in about 3 months (around 02/24/2024).  Delon LITTIE Merlin, DPM      [facility] LOCATION: [address], [place] [address]                   Office [phone]   [facility] LOCATION: [address] [place], [place] [address] Office [phone]

## 2023-12-04 ENCOUNTER — Other Ambulatory Visit: Payer: Self-pay | Admitting: Cardiology

## 2024-01-11 ENCOUNTER — Encounter: Payer: Self-pay | Admitting: Emergency Medicine

## 2024-01-11 ENCOUNTER — Emergency Department

## 2024-01-11 ENCOUNTER — Inpatient Hospital Stay
Admission: EM | Admit: 2024-01-11 | Discharge: 2024-01-13 | DRG: 418 | Disposition: A | Discharge status: Home / Self Care | Attending: Internal Medicine | Admitting: Internal Medicine

## 2024-01-11 DIAGNOSIS — R109 Unspecified abdominal pain: Secondary | ICD-10-CM

## 2024-01-11 DIAGNOSIS — E876 Hypokalemia: Secondary | ICD-10-CM | POA: Diagnosis present

## 2024-01-11 DIAGNOSIS — Z951 Presence of aortocoronary bypass graft: Secondary | ICD-10-CM

## 2024-01-11 DIAGNOSIS — K811 Chronic cholecystitis: Principal | ICD-10-CM | POA: Diagnosis present

## 2024-01-11 DIAGNOSIS — J45909 Unspecified asthma, uncomplicated: Secondary | ICD-10-CM | POA: Diagnosis present

## 2024-01-11 DIAGNOSIS — Z7982 Long term (current) use of aspirin: Secondary | ICD-10-CM

## 2024-01-11 DIAGNOSIS — Z887 Allergy status to serum and vaccine status: Secondary | ICD-10-CM

## 2024-01-11 DIAGNOSIS — I10 Essential (primary) hypertension: Secondary | ICD-10-CM | POA: Diagnosis present

## 2024-01-11 DIAGNOSIS — I110 Hypertensive heart disease with heart failure: Secondary | ICD-10-CM | POA: Diagnosis present

## 2024-01-11 DIAGNOSIS — Z794 Long term (current) use of insulin: Secondary | ICD-10-CM

## 2024-01-11 DIAGNOSIS — K219 Gastro-esophageal reflux disease without esophagitis: Secondary | ICD-10-CM | POA: Diagnosis present

## 2024-01-11 DIAGNOSIS — Z970 Presence of artificial eye: Secondary | ICD-10-CM

## 2024-01-11 DIAGNOSIS — Z90710 Acquired absence of both cervix and uterus: Secondary | ICD-10-CM

## 2024-01-11 DIAGNOSIS — E785 Hyperlipidemia, unspecified: Secondary | ICD-10-CM | POA: Diagnosis present

## 2024-01-11 DIAGNOSIS — K8012 Calculus of gallbladder with acute and chronic cholecystitis without obstruction: Principal | ICD-10-CM | POA: Diagnosis present

## 2024-01-11 DIAGNOSIS — E119 Type 2 diabetes mellitus without complications: Secondary | ICD-10-CM | POA: Diagnosis present

## 2024-01-11 DIAGNOSIS — Z87891 Personal history of nicotine dependence: Secondary | ICD-10-CM

## 2024-01-11 DIAGNOSIS — Z7984 Long term (current) use of oral hypoglycemic drugs: Secondary | ICD-10-CM

## 2024-01-11 DIAGNOSIS — I5032 Chronic diastolic (congestive) heart failure: Secondary | ICD-10-CM | POA: Diagnosis present

## 2024-01-11 DIAGNOSIS — Z888 Allergy status to other drugs, medicaments and biological substances status: Secondary | ICD-10-CM

## 2024-01-11 DIAGNOSIS — R160 Hepatomegaly, not elsewhere classified: Secondary | ICD-10-CM | POA: Diagnosis present

## 2024-01-11 DIAGNOSIS — Z79899 Other long term (current) drug therapy: Secondary | ICD-10-CM

## 2024-01-11 DIAGNOSIS — K810 Acute cholecystitis: Secondary | ICD-10-CM | POA: Diagnosis present

## 2024-01-11 DIAGNOSIS — I2510 Atherosclerotic heart disease of native coronary artery without angina pectoris: Secondary | ICD-10-CM | POA: Diagnosis present

## 2024-01-11 LAB — COMPREHENSIVE METABOLIC PANEL WITH GFR
ALT: 52 U/L — ABNORMAL HIGH (ref 0–44)
AST: 218 U/L — ABNORMAL HIGH (ref 15–41)
Albumin: 3.6 g/dL (ref 3.5–5.0)
Alkaline Phosphatase: 132 U/L — ABNORMAL HIGH (ref 38–126)
Anion gap: 14 (ref 5–15)
BUN: 15 mg/dL (ref 8–23)
CO2: 24 mmol/L (ref 22–32)
Calcium: 8.7 mg/dL — ABNORMAL LOW (ref 8.9–10.3)
Chloride: 101 mmol/L (ref 98–111)
Creatinine, Ser: 0.98 mg/dL (ref 0.44–1.00)
GFR, Estimated: >60 mL/min (ref >60)
Glucose, Bld: 214 mg/dL — ABNORMAL HIGH (ref 70–99)
Potassium: 2.8 mmol/L — ABNORMAL LOW (ref 3.5–5.1)
Sodium: 139 mmol/L (ref 135–145)
Total Bilirubin: 2.4 mg/dL — ABNORMAL HIGH (ref 0.0–1.2)
Total Protein: 8.1 g/dL (ref 6.5–8.1)

## 2024-01-11 LAB — CBC WITH DIFFERENTIAL/PLATELET
Abs Immature Granulocytes: 0.08 K/uL — ABNORMAL HIGH (ref 0.00–0.07)
Basophils Absolute: 0.1 K/uL (ref 0.0–0.1)
Basophils Relative: 0 %
Eosinophils Absolute: 0.1 K/uL (ref 0.0–0.5)
Eosinophils Relative: 1 %
HCT: 36.3 % (ref 36.0–46.0)
Hemoglobin: 11.9 g/dL — ABNORMAL LOW (ref 12.0–15.0)
Immature Granulocytes: 1 %
Lymphocytes Relative: 10 %
Lymphs Abs: 1.5 K/uL (ref 0.7–4.0)
MCH: 27.6 pg (ref 26.0–34.0)
MCHC: 32.8 g/dL (ref 30.0–36.0)
MCV: 84.2 fL (ref 80.0–100.0)
Monocytes Absolute: 0.9 K/uL (ref 0.1–1.0)
Monocytes Relative: 6 %
Neutro Abs: 12.6 K/uL — ABNORMAL HIGH (ref 1.7–7.7)
Neutrophils Relative %: 82 %
Platelets: 262 K/uL (ref 150–400)
RBC: 4.31 MIL/uL (ref 3.87–5.11)
RDW: 13.2 % (ref 11.5–15.5)
WBC: 15.1 K/uL — ABNORMAL HIGH (ref 4.0–10.5)
nRBC: 0 % (ref 0.0–0.2)

## 2024-01-11 LAB — URINALYSIS, ROUTINE W REFLEX MICROSCOPIC
Bacteria, UA: NONE SEEN
Bilirubin Urine: NEGATIVE
Glucose, UA: >=500 mg/dL — AB
Ketones, ur: NEGATIVE mg/dL
Leukocytes,Ua: NEGATIVE
Nitrite: NEGATIVE
Protein, ur: 30 mg/dL — AB
Specific Gravity, Urine: 1.027 (ref 1.005–1.030)
pH: 5 (ref 5.0–8.0)

## 2024-01-11 LAB — MAGNESIUM: Magnesium: 1.7 mg/dL (ref 1.7–2.4)

## 2024-01-11 LAB — LIPASE, BLOOD: Lipase: 34 U/L (ref 11–51)

## 2024-01-11 LAB — TROPONIN I (HIGH SENSITIVITY): Troponin I (High Sensitivity): 10 ng/L (ref <18)

## 2024-01-11 LAB — BRAIN NATRIURETIC PEPTIDE: B Natriuretic Peptide: 50.6 pg/mL (ref 0.0–100.0)

## 2024-01-11 MED ORDER — IOHEXOL 300 MG/ML  SOLN
100.0000 mL | Freq: Once | INTRAMUSCULAR | Status: AC | PRN
  Administered 2024-01-11: 100 mL via INTRAVENOUS

## 2024-01-11 MED ORDER — PIPERACILLIN-TAZOBACTAM 3.375 G IVPB 30 MIN
3.3750 g | Freq: Once | INTRAVENOUS | Status: AC
  Administered 2024-01-11: 3.375 g via INTRAVENOUS
  Filled 2024-01-11: qty 50

## 2024-01-11 MED ORDER — ONDANSETRON HCL 4 MG/2ML IJ SOLN
4.0000 mg | Freq: Once | INTRAMUSCULAR | Status: AC
  Administered 2024-01-11: 4 mg via INTRAVENOUS
  Filled 2024-01-11: qty 2

## 2024-01-11 MED ORDER — POTASSIUM CHLORIDE CRYS ER 20 MEQ PO TBCR
40.0000 meq | EXTENDED_RELEASE_TABLET | Freq: Once | ORAL | Status: AC
  Administered 2024-01-12: 40 meq via ORAL
  Filled 2024-01-11: qty 2

## 2024-01-11 MED ORDER — SODIUM CHLORIDE 0.9 % IV BOLUS
1000.0000 mL | Freq: Once | INTRAVENOUS | Status: AC
  Administered 2024-01-11: 1000 mL via INTRAVENOUS

## 2024-01-11 MED ORDER — POTASSIUM CHLORIDE 10 MEQ/100ML IV SOLN
10.0000 meq | Freq: Once | INTRAVENOUS | Status: AC
  Administered 2024-01-12: 10 meq via INTRAVENOUS
  Filled 2024-01-11: qty 100

## 2024-01-11 MED ORDER — MORPHINE SULFATE (PF) 4 MG/ML IV SOLN
4.0000 mg | Freq: Once | INTRAVENOUS | Status: AC
  Administered 2024-01-11: 4 mg via INTRAVENOUS
  Filled 2024-01-11 (×2): qty 1

## 2024-01-11 NOTE — ED Triage Notes (Signed)
 Pt having R sided flank pain- hurts to breathe and when she uses restroom. Denies hx of kidney stone or UTI. Does not take blood thinners. SOB but denies chest pain.

## 2024-01-11 NOTE — ED Provider Notes (Signed)
 [facility] Provider Note    Event Date/Time   First MD Initiated Contact with Patient 01/11/24 2305     (approximate)   History   Flank Pain and Shortness of Breath   HPI  Erica Estrada is a 73 y.o. female  with a past medical history of coronary artery disease status post CABG x 3 (12/30/2022), hypertension, type 2 diabetes, former smoker x 20+ years, hyperlipidemia, anemia,       Physical Exam   Triage Vital Signs: ED Triage Vitals  Encounter Vitals Group     BP 01/11/24 1942 126/66     Girls Systolic BP Percentile --      Girls Diastolic BP Percentile --      Boys Systolic BP Percentile --      Boys Diastolic BP Percentile --      Pulse Rate 01/11/24 1942 80     Resp 01/11/24 1942 18     Temp 01/11/24 1942 98.6 F (37 C)     Temp Source 01/11/24 1942 Oral     SpO2 01/11/24 1942 94 %     Weight 01/11/24 1940 162 lb (73.5 kg)     Height 01/11/24 1940 5' 5 (1.651 m)     Head Circumference --      Peak Flow --      Pain Score --      Pain Loc --      Pain Education --      Exclude from Growth Chart --     Most recent vital signs: Vitals:   01/11/24 1942  BP: 126/66  Pulse: 80  Resp: 18  Temp: 98.6 F (37 C)  SpO2: 94%    Nursing Triage Note reviewed. Vital signs reviewed and patients oxygen saturation is normoxic***  General: Patient is well nourished, well developed, awake and alert, resting comfortably in no acute distress Head: Normocephalic and atraumatic Eyes: Normal inspection, extraocular muscles intact, no conjunctival pallor Ear, nose, throat: Normal external exam Neck: Normal range of motion Respiratory: Patient is in no respiratory distress, lungs CTAB Cardiovascular: Patient is not tachycardic, RRR without murmur appreciated GI: Abd SNT with no guarding or rebound  Back: Normal inspection of the back with good strength and range of motion throughout all ext Extremities: pulses intact with good cap  refills, no LE pitting edema or calf tenderness Neuro: The patient is alert and oriented to person, place, and time, appropriately conversive, with 5/5 bilat UE/LE strength, no gross motor or sensory defects noted. Coordination appears to be adequate. Skin: Warm, dry, and intact Psych: normal mood and affect, no SI or HI  ED Results / Procedures / Treatments   Labs (all labs ordered are listed, but only abnormal results are displayed) Labs Reviewed  COMPREHENSIVE METABOLIC PANEL WITH GFR - Abnormal; Notable for the following components:      Result Value   Potassium 2.8 (*)    Glucose, Bld 214 (*)    Calcium  8.7 (*)    AST 218 (*)    ALT 52 (*)    Alkaline Phosphatase 132 (*)    Total Bilirubin 2.4 (*)    All other components within normal limits  CBC WITH DIFFERENTIAL/PLATELET - Abnormal; Notable for the following components:   WBC 15.1 (*)    Hemoglobin 11.9 (*)    Neutro Abs 12.6 (*)    Abs Immature Granulocytes 0.08 (*)    All other components within normal limits  URINALYSIS, ROUTINE W REFLEX  MICROSCOPIC - Abnormal; Notable for the following components:   Color, Urine AMBER (*)    APPearance CLEAR (*)    Glucose, UA >=500 (*)    Hgb urine dipstick SMALL (*)    Protein, ur 30 (*)    All other components within normal limits  BRAIN NATRIURETIC PEPTIDE  TROPONIN I (HIGH SENSITIVITY)  TROPONIN I (HIGH SENSITIVITY)     EKG   RADIOLOGY ***    PROCEDURES:  Critical Care performed: {CriticalCareYesNo:19197::Yes, see critical care procedure note(s),No}  Procedures   MEDICATIONS ORDERED IN ED: Medications - No data to display   IMPRESSION / MDM / ASSESSMENT AND PLAN / ED COURSE                                Differential diagnosis includes, but is not limited to, ***    ***     -- Risk: 5 This patient has a high risk of morbidity due to further diagnostic testing or treatment. Rationale: This patient's evaluation and management involve a high  risk of morbidity due to the potential severity of presenting symptoms, need for diagnostic testing, and/or initiation of treatment that may require close monitoring. The differential includes conditions with potential for significant deterioration or requiring escalation of care. Treatment decisions in the ED, including medication administration, procedural interventions, or disposition planning, reflect this level of risk. COPA: 5 The patient has the following acute or chronic illness/injury that poses a possible threat to life or bodily function: [X] : The patient has a potentially serious acute condition or an acute exacerbation of a chronic illness requiring urgent evaluation and management in the Emergency Department. The clinical presentation necessitates immediate consideration of life-threatening or function-threatening diagnoses, even if they are ultimately ruled out.   FINAL CLINICAL IMPRESSION(S) / ED DIAGNOSES   Final diagnoses:  None     Rx / DC Orders   ED Discharge Orders     None        Note:  This document was prepared using Dragon voice recognition software and may include unintentional dictation errors.

## 2024-01-11 NOTE — ED Notes (Signed)
 Patient transported to CT

## 2024-01-12 ENCOUNTER — Inpatient Hospital Stay: Admitting: Anesthesiology

## 2024-01-12 ENCOUNTER — Other Ambulatory Visit: Payer: Self-pay

## 2024-01-12 ENCOUNTER — Encounter: Admission: EM | Disposition: A | Payer: Self-pay | Source: Home / Self Care | Attending: Internal Medicine

## 2024-01-12 ENCOUNTER — Emergency Department

## 2024-01-12 DIAGNOSIS — K811 Chronic cholecystitis: Secondary | ICD-10-CM | POA: Diagnosis present

## 2024-01-12 DIAGNOSIS — I110 Hypertensive heart disease with heart failure: Secondary | ICD-10-CM | POA: Diagnosis present

## 2024-01-12 DIAGNOSIS — Z794 Long term (current) use of insulin: Secondary | ICD-10-CM | POA: Diagnosis not present

## 2024-01-12 DIAGNOSIS — Z970 Presence of artificial eye: Secondary | ICD-10-CM | POA: Diagnosis not present

## 2024-01-12 DIAGNOSIS — J45909 Unspecified asthma, uncomplicated: Secondary | ICD-10-CM | POA: Diagnosis present

## 2024-01-12 DIAGNOSIS — I5032 Chronic diastolic (congestive) heart failure: Secondary | ICD-10-CM | POA: Diagnosis present

## 2024-01-12 DIAGNOSIS — Z7984 Long term (current) use of oral hypoglycemic drugs: Secondary | ICD-10-CM | POA: Diagnosis not present

## 2024-01-12 DIAGNOSIS — Z90710 Acquired absence of both cervix and uterus: Secondary | ICD-10-CM | POA: Diagnosis not present

## 2024-01-12 DIAGNOSIS — E119 Type 2 diabetes mellitus without complications: Secondary | ICD-10-CM | POA: Diagnosis present

## 2024-01-12 DIAGNOSIS — Z7982 Long term (current) use of aspirin: Secondary | ICD-10-CM | POA: Diagnosis not present

## 2024-01-12 DIAGNOSIS — Z888 Allergy status to other drugs, medicaments and biological substances status: Secondary | ICD-10-CM | POA: Diagnosis not present

## 2024-01-12 DIAGNOSIS — Z951 Presence of aortocoronary bypass graft: Secondary | ICD-10-CM | POA: Diagnosis not present

## 2024-01-12 DIAGNOSIS — Z87891 Personal history of nicotine dependence: Secondary | ICD-10-CM | POA: Diagnosis not present

## 2024-01-12 DIAGNOSIS — K219 Gastro-esophageal reflux disease without esophagitis: Secondary | ICD-10-CM | POA: Diagnosis present

## 2024-01-12 DIAGNOSIS — K810 Acute cholecystitis: Secondary | ICD-10-CM | POA: Diagnosis not present

## 2024-01-12 DIAGNOSIS — Z79899 Other long term (current) drug therapy: Secondary | ICD-10-CM | POA: Diagnosis not present

## 2024-01-12 DIAGNOSIS — I2510 Atherosclerotic heart disease of native coronary artery without angina pectoris: Secondary | ICD-10-CM | POA: Diagnosis present

## 2024-01-12 DIAGNOSIS — Z887 Allergy status to serum and vaccine status: Secondary | ICD-10-CM | POA: Diagnosis not present

## 2024-01-12 DIAGNOSIS — K8012 Calculus of gallbladder with acute and chronic cholecystitis without obstruction: Secondary | ICD-10-CM | POA: Diagnosis present

## 2024-01-12 DIAGNOSIS — E876 Hypokalemia: Secondary | ICD-10-CM | POA: Diagnosis present

## 2024-01-12 DIAGNOSIS — E785 Hyperlipidemia, unspecified: Secondary | ICD-10-CM | POA: Diagnosis present

## 2024-01-12 DIAGNOSIS — R160 Hepatomegaly, not elsewhere classified: Secondary | ICD-10-CM | POA: Diagnosis present

## 2024-01-12 LAB — GLUCOSE, CAPILLARY
Glucose-Capillary: 73 mg/dL (ref 70–99)
Glucose-Capillary: 179 mg/dL — ABNORMAL HIGH (ref 70–99)
Glucose-Capillary: 215 mg/dL — ABNORMAL HIGH (ref 70–99)
Glucose-Capillary: 188 mg/dL — ABNORMAL HIGH (ref 70–99)
Glucose-Capillary: 212 mg/dL — ABNORMAL HIGH (ref 70–99)

## 2024-01-12 LAB — LACTIC ACID, PLASMA
Lactic Acid, Venous: 1 mmol/L (ref 0.5–1.9)
Lactic Acid, Venous: 1.6 mmol/L (ref 0.5–1.9)

## 2024-01-12 LAB — HEMOGLOBIN A1C
Hgb A1c MFr Bld: 7.2 % — ABNORMAL HIGH (ref 4.8–5.6)
Mean Plasma Glucose: 159.94 mg/dL

## 2024-01-12 LAB — POTASSIUM: Potassium: 3 mmol/L — ABNORMAL LOW (ref 3.5–5.1)

## 2024-01-12 LAB — TROPONIN I (HIGH SENSITIVITY): Troponin I (High Sensitivity): 11 ng/L (ref <18)

## 2024-01-12 SURGERY — CHOLECYSTECTOMY, ROBOT-ASSISTED, LAPAROSCOPIC
Anesthesia: General | Site: Abdomen

## 2024-01-12 MED ORDER — HEMOSTATIC AGENTS (NO CHARGE) OPTIME
TOPICAL | Status: DC | PRN
  Administered 2024-01-12: 1 via TOPICAL

## 2024-01-12 MED ORDER — DEXMEDETOMIDINE HCL IN NACL 80 MCG/20ML IV SOLN
INTRAVENOUS | Status: DC | PRN
  Administered 2024-01-12: 8 ug via INTRAVENOUS

## 2024-01-12 MED ORDER — DROPERIDOL 2.5 MG/ML IJ SOLN
INTRAMUSCULAR | Status: AC
  Filled 2024-01-12: qty 2

## 2024-01-12 MED ORDER — GABAPENTIN 300 MG PO CAPS
300.0000 mg | ORAL_CAPSULE | Freq: Every evening | ORAL | Status: DC
  Administered 2024-01-12: 300 mg via ORAL
  Filled 2024-01-12: qty 1

## 2024-01-12 MED ORDER — ACETAMINOPHEN 325 MG PO TABS: 650.0000 mg | ORAL_TABLET | Freq: Four times a day (QID) | ORAL | Status: DC | PRN

## 2024-01-12 MED ORDER — FENTANYL CITRATE (PF) 100 MCG/2ML IJ SOLN
INTRAMUSCULAR | Status: AC
  Filled 2024-01-12: qty 2

## 2024-01-12 MED ORDER — SUGAMMADEX SODIUM 200 MG/2ML IV SOLN
INTRAVENOUS | Status: DC | PRN
Start: 2024-01-12 — End: 2024-01-12
  Administered 2024-01-12: 200 mg via INTRAVENOUS

## 2024-01-12 MED ORDER — PIPERACILLIN-TAZOBACTAM 3.375 G IVPB
INTRAVENOUS | Status: AC
Start: 2024-01-12 — End: 2024-01-12
  Filled 2024-01-12: qty 50

## 2024-01-12 MED ORDER — FENTANYL CITRATE (PF) 100 MCG/2ML IJ SOLN
INTRAMUSCULAR | Status: DC | PRN
  Administered 2024-01-12: 25 ug via INTRAVENOUS
  Administered 2024-01-12 (×2): 50 ug via INTRAVENOUS
  Administered 2024-01-12: 25 ug via INTRAVENOUS
  Administered 2024-01-12: 50 ug via INTRAVENOUS

## 2024-01-12 MED ORDER — METHIMAZOLE 5 MG PO TABS
5.0000 mg | ORAL_TABLET | Freq: Every day | ORAL | Status: DC
  Administered 2024-01-12 – 2024-01-13 (×2): 5 mg via ORAL
  Filled 2024-01-12 (×2): qty 1

## 2024-01-12 MED ORDER — INDOCYANINE GREEN 25 MG IV SOLR
INTRAVENOUS | Status: AC
  Filled 2024-01-12: qty 10

## 2024-01-12 MED ORDER — ROCURONIUM BROMIDE 100 MG/10ML IV SOLN
INTRAVENOUS | Status: DC | PRN
  Administered 2024-01-12: 60 mg via INTRAVENOUS
  Administered 2024-01-12: 20 mg via INTRAVENOUS

## 2024-01-12 MED ORDER — FENTANYL CITRATE (PF) 100 MCG/2ML IJ SOLN: 25.0000 ug | INTRAMUSCULAR | Status: DC | PRN

## 2024-01-12 MED ORDER — ACETAMINOPHEN 10 MG/ML IV SOLN: 1000.0000 mg | Freq: Once | INTRAVENOUS | Status: DC | PRN

## 2024-01-12 MED ORDER — DEXTROSE 50 % IV SOLN
25.0000 mL | Freq: Once | INTRAVENOUS | Status: AC
Start: 2024-01-12 — End: 2024-01-12
  Administered 2024-01-12: 25 mL via INTRAVENOUS

## 2024-01-12 MED ORDER — DEXTROSE 50 % IV SOLN
INTRAVENOUS | Status: AC
  Filled 2024-01-12: qty 50

## 2024-01-12 MED ORDER — 0.9 % SODIUM CHLORIDE (POUR BTL) OPTIME
TOPICAL | Status: DC | PRN
Start: 2024-01-12 — End: 2024-01-12
  Administered 2024-01-12: 500 mL

## 2024-01-12 MED ORDER — BUPIVACAINE-EPINEPHRINE (PF) 0.5% -1:200000 IJ SOLN
INTRAMUSCULAR | Status: DC | PRN
  Administered 2024-01-12: 30 mL via PERINEURAL

## 2024-01-12 MED ORDER — METOPROLOL TARTRATE 25 MG PO TABS
25.0000 mg | ORAL_TABLET | Freq: Two times a day (BID) | ORAL | Status: DC
  Administered 2024-01-12 – 2024-01-13 (×2): 25 mg via ORAL
  Filled 2024-01-12 (×4): qty 1

## 2024-01-12 MED ORDER — LIDOCAINE HCL (PF) 2 % IJ SOLN
INTRAMUSCULAR | Status: AC
  Filled 2024-01-12: qty 5

## 2024-01-12 MED ORDER — UMECLIDINIUM BROMIDE 62.5 MCG/ACT IN AEPB
1.0000 | INHALATION_SPRAY | Freq: Every day | RESPIRATORY_TRACT | Status: DC
  Administered 2024-01-13: 1 via RESPIRATORY_TRACT
  Filled 2024-01-12: qty 7

## 2024-01-12 MED ORDER — SODIUM CHLORIDE 0.9 % IV SOLN: INTRAVENOUS | Status: DC | PRN

## 2024-01-12 MED ORDER — INSULIN ASPART 100 UNIT/ML IJ SOLN
0.0000 [IU] | Freq: Every day | INTRAMUSCULAR | Status: DC
  Administered 2024-01-12: 2 [IU] via SUBCUTANEOUS
  Filled 2024-01-12: qty 1

## 2024-01-12 MED ORDER — OXYCODONE HCL 5 MG/5ML PO SOLN: 5.0000 mg | Freq: Once | ORAL | Status: DC | PRN

## 2024-01-12 MED ORDER — GADOBUTROL 1 MMOL/ML IV SOLN
7.0000 mL | Freq: Once | INTRAVENOUS | Status: AC | PRN
  Administered 2024-01-12: 7 mL via INTRAVENOUS

## 2024-01-12 MED ORDER — TIOTROPIUM BROMIDE 18 MCG IN CAPS: 1.0000 | ORAL_CAPSULE | Freq: Every day | RESPIRATORY_TRACT | Status: DC

## 2024-01-12 MED ORDER — SODIUM CHLORIDE 0.9 % IV BOLUS
500.0000 mL | Freq: Once | INTRAVENOUS | Status: AC
  Administered 2024-01-12: 500 mL via INTRAVENOUS

## 2024-01-12 MED ORDER — ROCURONIUM BROMIDE 10 MG/ML (PF) SYRINGE
PREFILLED_SYRINGE | INTRAVENOUS | Status: AC
  Filled 2024-01-12: qty 10

## 2024-01-12 MED ORDER — ACETAMINOPHEN 10 MG/ML IV SOLN
INTRAVENOUS | Status: AC
  Filled 2024-01-12: qty 100

## 2024-01-12 MED ORDER — HYDROMORPHONE HCL 1 MG/ML IJ SOLN
0.5000 mg | INTRAMUSCULAR | Status: DC | PRN
  Administered 2024-01-12 – 2024-01-13 (×2): 0.5 mg via INTRAVENOUS
  Filled 2024-01-12 (×3): qty 0.5

## 2024-01-12 MED ORDER — ASPIRIN 81 MG PO TBEC
81.0000 mg | DELAYED_RELEASE_TABLET | Freq: Every day | ORAL | Status: DC
  Filled 2024-01-12 (×2): qty 1

## 2024-01-12 MED ORDER — ENOXAPARIN SODIUM 40 MG/0.4ML IJ SOSY
40.0000 mg | PREFILLED_SYRINGE | INTRAMUSCULAR | Status: DC
  Administered 2024-01-12: 40 mg via SUBCUTANEOUS
  Filled 2024-01-12: qty 0.4

## 2024-01-12 MED ORDER — MAGNESIUM SULFATE IN D5W 1-5 GM/100ML-% IV SOLN
1.0000 g | Freq: Once | INTRAVENOUS | Status: AC
  Administered 2024-01-12: 1 g via INTRAVENOUS
  Filled 2024-01-12: qty 100

## 2024-01-12 MED ORDER — INSULIN ASPART 100 UNIT/ML IJ SOLN
0.0000 [IU] | Freq: Three times a day (TID) | INTRAMUSCULAR | Status: DC
  Administered 2024-01-12 – 2024-01-13 (×2): 3 [IU] via SUBCUTANEOUS
  Filled 2024-01-12 (×3): qty 1

## 2024-01-12 MED ORDER — PANTOPRAZOLE SODIUM 40 MG PO TBEC
40.0000 mg | DELAYED_RELEASE_TABLET | Freq: Every day | ORAL | Status: DC
  Administered 2024-01-12 – 2024-01-13 (×2): 40 mg via ORAL
  Filled 2024-01-12 (×3): qty 1

## 2024-01-12 MED ORDER — ALBUTEROL SULFATE (2.5 MG/3ML) 0.083% IN NEBU: 3.0000 mL | INHALATION_SOLUTION | Freq: Four times a day (QID) | RESPIRATORY_TRACT | Status: DC | PRN

## 2024-01-12 MED ORDER — SERTRALINE HCL 50 MG PO TABS
25.0000 mg | ORAL_TABLET | Freq: Every day | ORAL | Status: DC
  Administered 2024-01-12 – 2024-01-13 (×2): 25 mg via ORAL
  Filled 2024-01-12 (×2): qty 1

## 2024-01-12 MED ORDER — ONDANSETRON HCL 4 MG/2ML IJ SOLN
INTRAMUSCULAR | Status: AC
  Filled 2024-01-12: qty 2

## 2024-01-12 MED ORDER — ONDANSETRON HCL 4 MG/2ML IJ SOLN: 4.0000 mg | Freq: Four times a day (QID) | INTRAMUSCULAR | Status: DC | PRN

## 2024-01-12 MED ORDER — PROPOFOL 10 MG/ML IV BOLUS
INTRAVENOUS | Status: AC
  Filled 2024-01-12: qty 20

## 2024-01-12 MED ORDER — TRAZODONE HCL 50 MG PO TABS
50.0000 mg | ORAL_TABLET | Freq: Every day | ORAL | Status: DC
  Administered 2024-01-12: 50 mg via ORAL
  Filled 2024-01-12: qty 1

## 2024-01-12 MED ORDER — OXYCODONE HCL 5 MG PO TABS: 5.0000 mg | ORAL_TABLET | Freq: Once | ORAL | Status: DC | PRN

## 2024-01-12 MED ORDER — PIPERACILLIN-TAZOBACTAM 3.375 G IVPB
3.3750 g | Freq: Three times a day (TID) | INTRAVENOUS | Status: DC
  Administered 2024-01-12 – 2024-01-13 (×4): 3.375 g via INTRAVENOUS
  Filled 2024-01-12 (×4): qty 50

## 2024-01-12 MED ORDER — ONDANSETRON HCL 4 MG/2ML IJ SOLN
INTRAMUSCULAR | Status: DC | PRN
  Administered 2024-01-12: 4 mg via INTRAVENOUS

## 2024-01-12 MED ORDER — ACETAMINOPHEN 10 MG/ML IV SOLN
INTRAVENOUS | Status: DC | PRN
  Administered 2024-01-12: 1000 mg via INTRAVENOUS

## 2024-01-12 MED ORDER — CYCLOBENZAPRINE HCL 10 MG PO TABS
5.0000 mg | ORAL_TABLET | Freq: Every day | ORAL | Status: DC
  Administered 2024-01-12: 5 mg via ORAL
  Filled 2024-01-12: qty 1

## 2024-01-12 MED ORDER — PAROXETINE HCL 20 MG PO TABS
40.0000 mg | ORAL_TABLET | ORAL | Status: DC
  Administered 2024-01-12 – 2024-01-13 (×2): 40 mg via ORAL
  Filled 2024-01-12 (×2): qty 2

## 2024-01-12 MED ORDER — LIDOCAINE HCL (CARDIAC) PF 100 MG/5ML IV SOSY
PREFILLED_SYRINGE | INTRAVENOUS | Status: DC | PRN
  Administered 2024-01-12: 60 mg via INTRAVENOUS

## 2024-01-12 MED ORDER — DROPERIDOL 2.5 MG/ML IJ SOLN
0.6250 mg | Freq: Once | INTRAMUSCULAR | Status: AC | PRN
  Administered 2024-01-12: 0.625 mg via INTRAVENOUS

## 2024-01-12 MED ORDER — FLUTICASONE PROPIONATE 50 MCG/ACT NA SUSP
1.0000 | Freq: Every day | NASAL | Status: DC
  Administered 2024-01-13: 1 via NASAL
  Filled 2024-01-12: qty 16

## 2024-01-12 MED ORDER — DEXAMETHASONE SOD PHOSPHATE PF 10 MG/ML IJ SOLN
INTRAMUSCULAR | Status: DC | PRN
  Administered 2024-01-12: 8 mg via INTRAVENOUS

## 2024-01-12 MED ORDER — INDOCYANINE GREEN 25 MG IV SOLR
1.2500 mg | Freq: Once | INTRAVENOUS | Status: AC
  Administered 2024-01-12: 1.25 mg via TOPICAL
  Filled 2024-01-12: qty 10

## 2024-01-12 MED ORDER — PROPOFOL 10 MG/ML IV BOLUS
INTRAVENOUS | Status: DC | PRN
Start: 2024-01-12 — End: 2024-01-12
  Administered 2024-01-12: 50 mg via INTRAVENOUS
  Administered 2024-01-12: 80 mg via INTRAVENOUS

## 2024-01-12 SURGICAL SUPPLY — 39 items
BAG PRESSURE INF REUSE 1000 (BAG) IMPLANT
CANNULA REDUCER 12-8 DVNC XI (CANNULA) ×1 IMPLANT
CAUTERY HOOK MNPLR 1.6 DVNC XI (INSTRUMENTS) ×1 IMPLANT
CLIP LIGATING HEMO O LOK GREEN (MISCELLANEOUS) ×1 IMPLANT
DEFOGGER SCOPE WARM SEASHARP (MISCELLANEOUS) ×1 IMPLANT
DERMABOND ADVANCED .7 DNX12 (GAUZE/BANDAGES/DRESSINGS) ×1 IMPLANT
DERMABOND ADVANCED .7 DNX6 (GAUZE/BANDAGES/DRESSINGS) IMPLANT
DRAPE ARM DVNC X/XI (DISPOSABLE) ×4 IMPLANT
DRAPE COLUMN DVNC XI (DISPOSABLE) ×1 IMPLANT
ELECTRODE REM PT RTRN 9FT ADLT (ELECTROSURGICAL) ×1 IMPLANT
FORCEPS BPLR 8 MD DVNC XI (FORCEP) IMPLANT
FORCEPS BPLR R/ABLATION 8 DVNC (INSTRUMENTS) ×1 IMPLANT
FORCEPS PROGRASP DVNC XI (FORCEP) ×1 IMPLANT
GLOVE BIOGEL PI IND STRL 7.5 (GLOVE) ×2 IMPLANT
GLOVE SURG SYN 7.0 PF PI (GLOVE) ×2 IMPLANT
GOWN STRL REUS W/ TWL LRG LVL3 (GOWN DISPOSABLE) ×3 IMPLANT
GRASPER SUT TROCAR 14GX15 (MISCELLANEOUS) ×1 IMPLANT
HEMOSTAT SURGICEL 2X14 (HEMOSTASIS) IMPLANT
IRRIGATOR SUCT 8 DISP DVNC XI (IRRIGATION / IRRIGATOR) IMPLANT
IV 0.9% NACL 1000 ML (IV SOLUTION) IMPLANT
KIT PINK PAD W/HEAD ARM REST (MISCELLANEOUS) ×1 IMPLANT
LABEL OR SOLS (LABEL) ×1 IMPLANT
MANIFOLD NEPTUNE II (INSTRUMENTS) ×1 IMPLANT
NDL HYPO 22X1.5 SAFETY MO (MISCELLANEOUS) ×1 IMPLANT
NDL INSUFFLATION 14GA 120MM (NEEDLE) ×1 IMPLANT
NEEDLE HYPO 22X1.5 SAFETY MO (MISCELLANEOUS) ×1 IMPLANT
NEEDLE INSUFFLATION 14GA 120MM (NEEDLE) ×1 IMPLANT
NS IRRIG 500ML POUR BTL (IV SOLUTION) ×1 IMPLANT
OBTURATOR OPTICALSTD 8 DVNC (TROCAR) ×1 IMPLANT
PACK LAP CHOLECYSTECTOMY (MISCELLANEOUS) ×1 IMPLANT
SEAL UNIV 5-12 XI (MISCELLANEOUS) ×4 IMPLANT
SET TUBE SMOKE EVAC HIGH FLOW (TUBING) ×1 IMPLANT
SOLUTION ELECTROSURG ANTI STCK (MISCELLANEOUS) ×1 IMPLANT
SPIKE FLUID TRANSFER (MISCELLANEOUS) ×2 IMPLANT
SPONGE T-LAP 4X18 ~~LOC~~+RFID (SPONGE) IMPLANT
SUT VICRYL 0 UR6 27IN ABS (SUTURE) ×1 IMPLANT
SUTURE MNCRL 4-0 27XMF (SUTURE) ×1 IMPLANT
SYSTEM BAG RETRIEVAL 10MM (BASKET) ×1 IMPLANT
WATER STERILE IRR 500ML POUR (IV SOLUTION) ×1 IMPLANT

## 2024-01-12 NOTE — Transfer of Care (Signed)
 Immediate Anesthesia Transfer of Care Note  Patient: Erica Estrada  Procedure(s) Performed: CHOLECYSTECTOMY, ROBOT-ASSISTED, LAPAROSCOPIC (Abdomen)  Patient Location: PACU  Anesthesia Type:General  Level of Consciousness: awake  Airway & Oxygen Therapy: Patient Spontanous Breathing and Patient connected to face mask oxygen  Post-op Assessment: Report given to RN and Post -op Vital signs reviewed and stable  Post vital signs: Reviewed and stable  Last Vitals:  Vitals Value Taken Time  BP 168/69 01/12/24 10:52  Temp    Pulse 89 01/12/24 11:00  Resp 8 01/12/24 11:00  SpO2 100 % 01/12/24 11:00  Vitals shown include unfiled device data.  Last Pain:  Vitals:   01/12/24 0838  TempSrc: Oral  PainSc:          Complications: No notable events documented.

## 2024-01-12 NOTE — Op Note (Signed)
 Robotic assisted laparoscopic Cholecystectomy  Pre-operative Diagnosis: Acute Cholecsytitis  Post-operative Diagnosis: Same  Procedure:  Robotic assisted laparoscopic Cholecystectomy  Surgeon: Jayson Endow, MD  Anesthesia: Gen. with endotracheal tube  Findings: Inflamed GB consistent with acute cholecystitis, critical view of safety obtained, surgicel sheet left in GB fossa  Estimated Blood Loss: 20cc       Specimens: Gallbladder           Complications: none   Procedure Details  The patient was seen again in the Holding Room. The benefits, complications, treatment options, and expected outcomes were discussed with the patient. The risks of bleeding, infection, recurrence of symptoms, failure to resolve symptoms, bile duct damage, bile duct leak, retained common bile duct stone, bowel injury, any of which could require further surgery and/or ERCP, stent, or papillotomy were reviewed with the patient. The likelihood of improving the patient's symptoms with return to their baseline status is good.  The patient and/or family concurred with the proposed plan, giving informed consent.  The patient was taken to Operating Room, identified  and the procedure verified as robotic Cholecystectomy.  A Time Out was held and the above information confirmed.  Prior to the induction of general anesthesia, antibiotic prophylaxis was administered. VTE prophylaxis was in place. General endotracheal anesthesia was then administered and tolerated well. After the induction, the abdomen was prepped with Chloraprep and draped in the sterile fashion. The patient was positioned in the supine position.  A veress needle was inserted into the abdomen using standard drop technique. An 8mm infra-umbilical robotic port was then placed under direct visualization. There was no injury noted at the site of veress needle insertion. Two right sided abdominal 8mm ports followed by an 8mm left abdominal robotic ports were  placed under direct visualization. The left sided abdominal port was then upsized to a 12mm robotic port.  The patient was positioned  in reverse Trendelenburg, robot was brought to the surgical field and docked in the standard fashion.  We made sure all the instrumentation was kept indirect view at all times and that there were no collision between the arms. I scrubbed out and went to the console.  The gallbladder was identified, the fundus grasped and retracted cephalad. Adhesions were lysed bluntly. The infundibulum was grasped and retracted laterally, exposing the peritoneum overlying the triangle of Calot. This was then divided and exposed in a blunt fashion. An extended critical view of the cystic duct and cystic artery was obtained.  The cystic duct was clearly identified and bluntly dissected.   Artery and duct were double clipped and divided. Using ICG cholangiography we visualized the cystic duct. The gallbladder was taken from the gallbladder fossa in a retrograde fashion with the electrocautery.  Hemostasis was achieved with the electrocautery. nspection of the right upper quadrant was performed. No bleeding, bile duct injury or leak, or bowel injury was noted.Surgicel was placed in GB fossa to aid in hemostasis Robotic instruments and robotic arms were undocked in the standard fashion.  I scrubbed back in.  The gallbladder was removed and placed in an Endocatch bag.   The left lower quadrant fascia was then closed with a 0 vicryl using a suture needle passer. The pre-peritoneal space was then infiltrated with liposomal bupivicaine and marcaine solution. Pneumoperitoneum was released.  4-0 subcuticular Monocryl was used to close the skin. Dermabond was  applied.  The patient was then extubated and brought to the recovery room in stable condition. Sponge, lap, and needle counts were correct  at closure and at the conclusion of the case.               Jayson Endow, M.D. [facility]

## 2024-01-12 NOTE — Progress Notes (Signed)
   01/12/24 0850  Spiritual Encounters  Type of Visit Initial  Care provided to: Patient  Conversation partners present during encounter Nurse  Referral source Other (comment) (Spiritual Consult)  Reason for visit Routine spiritual support  OnCall Visit No  Interventions  Spiritual Care Interventions Made Established relationship of care and support;Compassionate presence;Reflective listening;Normalization of emotions;Prayer;Encouragement  Intervention Outcomes  Outcomes Connection to spiritual care;Reduced anxiety;Reduced fear;Awareness of support  Spiritual Care Plan  Spiritual Care Issues Still Outstanding No further spiritual care needs at this time (see row info)   Spiritual consult placed for prayer and chaplain went to pt's room to find they had just taken her to surgery. Chaplain found pt in pre-op and had the opportunity to pray with and talk with pt before she went into surgery. Chaplain then made sure pt's husband knew she had found the pt and fulfilled the request for prayer.

## 2024-01-12 NOTE — Plan of Care (Signed)

## 2024-01-12 NOTE — Anesthesia Procedure Notes (Signed)
 Procedure Name: Intubation Date/Time: 01/12/2024 9:25 AM  Performed by: Jaylene Nest, CRNAPre-anesthesia Checklist: Patient identified, Patient being monitored, Timeout performed, Emergency Drugs available and Suction available Patient Re-evaluated:Patient Re-evaluated prior to induction Oxygen Delivery Method: Circle system utilized Preoxygenation: Pre-oxygenation with 100% oxygen Induction Type: IV induction Ventilation: Mask ventilation without difficulty Laryngoscope Size: 3, McGrath and 4 Grade View: Grade I Tube type: Oral Tube size: 7.0 mm Number of attempts: 1 Airway Equipment and Method: Stylet and Video-laryngoscopy Placement Confirmation: ETT inserted through vocal cords under direct vision, positive ETCO2 and breath sounds checked- equal and bilateral Secured at: 21 cm Tube secured with: Tape Dental Injury: Teeth and Oropharynx as per pre-operative assessment  Difficulty Due To: Difficult Airway- due to anterior larynx

## 2024-01-12 NOTE — Progress Notes (Signed)
 Patient status post robotic assisted cholecystectomy.  Patient did have cholecystitis.  Postoperatively okay for clear liquid diet and then she can advance as tolerated.  I would do 24 hours of perioperative antibiotics with Zosyn is fine.  Hold aspirin  until discharge which will hopefully be tomorrow.  No labs needed in the morning from surgical standpoint unless hemodynamic instability or clinical need for labs.

## 2024-01-12 NOTE — Anesthesia Preprocedure Evaluation (Signed)
 Anesthesia Evaluation  Patient identified by MRN, date of birth, ID band Patient awake    Reviewed: Allergy & Precautions, H&P , NPO status , Patient's Chart, lab work & pertinent test results, reviewed documented beta blocker date and time   Airway Mallampati: II  TM Distance: >3 FB Neck ROM: full    Dental  (+) Teeth Intact   Pulmonary shortness of breath and with exertion, asthma , former smoker   Pulmonary exam normal        Cardiovascular Exercise Tolerance: Poor hypertension, On Medications + CAD and +CHF  Normal cardiovascular exam Rhythm:regular Rate:Normal     Neuro/Psych  Headaches PSYCHIATRIC DISORDERS Anxiety Depression     Neuromuscular disease    GI/Hepatic ,GERD  Medicated,,(+) Hepatitis -  Endo/Other  negative endocrine ROSdiabetes    Renal/GU negative Renal ROS  negative genitourinary   Musculoskeletal   Abdominal   Peds  Hematology  (+) Blood dyscrasia, anemia   Anesthesia Other Findings Past Medical History: No date: Arthritis No date: Asthma No date: Coronary artery calcification     Comment:  Noted on prior CTA chest (01/2021) No date: Depression No date: Diabetes mellitus without complication (HCC) No date: Former tobacco use No date: GERD (gastroesophageal reflux disease) No date: Graves' disease No date: Headache 1995: Hepatitis     Comment:  Hep C.  Treated. No date: Hyperlipidemia No date: Hypertension No date: Multiple thyroid  nodules No date: Precordial chest pain     Comment:  MV 02/2021 low risk, no significant ischemia Past Surgical History: No date: ABDOMINAL HYSTERECTOMY 12/13/2022: CORONARY ARTERY BYPASS GRAFT; N/A     Comment:  Procedure: CORONARY ARTERY BYPASS GRAFTING (CABG) TIMES               THREE UTILIZING LEFT INTERNAL MAMMARY ARTERY AND               ENDOSCOPIC VEIN HARVEST RIGHT GREATER SAPHENOUS VEIN;                Surgeon: Lucas Dorise POUR, MD;  Location: [facility]  OR;  Service:              Open Heart Surgery;  Laterality: N/A; No date: EYE SURGERY; Left     Comment:  prosthetic eye put in 11/27/2022: RIGHT/LEFT HEART CATH AND CORONARY ANGIOGRAPHY; Bilateral     Comment:  Procedure: RIGHT/LEFT HEART CATH AND CORONARY               ANGIOGRAPHY;  Surgeon: Mady Bruckner, MD;  Location:               [facility] INVASIVE CV LAB;  Service: Cardiovascular;                Laterality: Bilateral; 12/13/2022: TEE WITHOUT CARDIOVERSION; N/A     Comment:  Procedure: TRANSESOPHAGEAL ECHOCARDIOGRAM;  Surgeon:               Lucas Dorise POUR, MD;  Location: [facility] OR;  Service: Open               Heart Surgery;  Laterality: N/A; No date: TONSILLECTOMY     Comment:  removed as a child BMI    Body Mass Index: 26.96 kg/m     Reproductive/Obstetrics negative OB ROS                              Anesthesia Physical Anesthesia Plan  ASA: 3 and emergent  Anesthesia Plan: General ETT   Post-op Pain Management:    Induction:   PONV Risk Score and Plan: 4 or greater  Airway Management Planned:   Additional Equipment:   Intra-op Plan:   Post-operative Plan:   Informed Consent: I have reviewed the patients History and Physical, chart, labs and discussed the procedure including the risks, benefits and alternatives for the proposed anesthesia with the patient or authorized representative who has indicated his/her understanding and acceptance.     Dental Advisory Given  Plan Discussed with: CRNA  Anesthesia Plan Comments:         Anesthesia Quick Evaluation

## 2024-01-12 NOTE — H&P (Signed)
 History and Physical    Erica Estrada FMW:[id number] DOB: 1951-01-28 DOA: 01/11/2024  PCP: [place] HERO, MD (Confirm with patient/family/NH records and if not entered, this has to be entered at [facility] point of entry) Patient coming from: Home  I have personally briefly reviewed patient's old medical records in [facility]  Chief Complaint: RUQ pain,   HPI: Erica Estrada is a 73 y.o. female with medical history significant of multivessel CAD status post CABG, HTN, IDDM, HLD, presented with worsening of RLQ abdominal pain.  Symptoms started 2 days ago, patient started develop intermittent cramping-like right upper quadrant abdominal pain, worsening with eating meals, denies any fever chills no nauseous vomiting.  Last night the pain became constant, associate with nausea but no vomiting and patient decided to come to ED.  Denies any chest pain shortness of breath palpitations lightheadedness.  ED Course: Afebrile, nontachycardic blood pressure 115/64 O2 sats of 100% on room air.  MRCP showed acute cholecystitis, without intrahepatic biliary dilation, a hepatic abscess versus mass also identified.  Blood work showed WBC 15.1 hemoglobin 0.9 BUN 15 creatinine 0.9 glucose 214 bicarb 24K 2.8.,  Magnesium  1.7.  Patient was given IV bolus 1.5 L and started on Zosyn.  General surgeon consulted.  Review of Systems: As per HPI otherwise 14 point review of systems negative.    Past Medical History:  Diagnosis Date   Arthritis    Asthma    Coronary artery calcification    Noted on prior CTA chest (01/2021)   Depression    Diabetes mellitus without complication (HCC)    Former tobacco use    GERD (gastroesophageal reflux disease)    Graves' disease    Headache    Hepatitis 1995   Hep C.  Treated.   Hyperlipidemia    Hypertension    Multiple thyroid  nodules    Precordial chest pain    MV 02/2021 low risk, no significant ischemia    Past Surgical History:  Procedure  Laterality Date   ABDOMINAL HYSTERECTOMY     CORONARY ARTERY BYPASS GRAFT N/A 12/13/2022   Procedure: CORONARY ARTERY BYPASS GRAFTING (CABG) TIMES THREE UTILIZING LEFT INTERNAL MAMMARY ARTERY AND ENDOSCOPIC VEIN HARVEST RIGHT GREATER SAPHENOUS VEIN;  Surgeon: Lucas Dorise POUR, MD;  Location: [facility] OR;  Service: Open Heart Surgery;  Laterality: N/A;   EYE SURGERY Left    prosthetic eye put in   RIGHT/LEFT HEART CATH AND CORONARY ANGIOGRAPHY Bilateral 11/27/2022   Procedure: RIGHT/LEFT HEART CATH AND CORONARY ANGIOGRAPHY;  Surgeon: Mady Bruckner, MD;  Location: [facility] INVASIVE CV LAB;  Service: Cardiovascular;  Laterality: Bilateral;   TEE WITHOUT CARDIOVERSION N/A 12/13/2022   Procedure: TRANSESOPHAGEAL ECHOCARDIOGRAM;  Surgeon: Lucas Dorise POUR, MD;  Location: [facility] OR;  Service: Open Heart Surgery;  Laterality: N/A;   TONSILLECTOMY     removed as a child     reports that she quit smoking about 4 years ago. Her smoking use included cigarettes. She started smoking about 37 years ago. She has a 16.5 pack-year smoking history. She has never used smokeless tobacco. She reports that she does not drink alcohol and does not use drugs.  Allergies  Allergen Reactions   Zoster Vac Recomb Adjuvanted     Other Reaction(s): cellulits   Ace Inhibitors Cough    Chronic cough for 3 months. Trial off lisinopril starting 05/11/2011 effective in stopping cough.   Lisinopril Other (See Comments)   Fluticasone  Other (See Comments)    choking    Family  History  Problem Relation Age of Onset   Breast cancer Neg Hx      Prior to Admission medications   Medication Sig Start Date End Date Taking? Authorizing Provider  acetaminophen  (TYLENOL ) 500 MG tablet Take 1-2 tablets (500-1,000 mg total) by mouth every 6 (six) hours as needed (for pain). Maximum dose of of 4000 mg (8 tablets) in a 24 hour period. Do not take with your NORCO/VICODIN because they contain acetaminophen . 01/03/23  Yes Hammock, Sheri, NP  amLODipine   (NORVASC ) 10 MG tablet Take 10 mg by mouth daily.   Yes [provider]  Artificial Saliva (BIOTENE DRY MOUTH MOISTURIZING) SOLN as needed (dry mouth). 07/14/20  Yes [provider]  ASPERCREME LIDOCAINE  4 % CREA Apply 1 application  topically 2 (two) times daily as needed (pain). Back and knees.   Yes [provider]  aspirin  EC 81 MG tablet Take 81 mg by mouth daily. Swallow whole.   Yes [provider]  atorvastatin  (LIPITOR ) 80 MG tablet Take 1 tablet (80 mg total) by mouth daily. 11/13/17  Yes Thomas, Lashunda, NP  calcium  carbonate (TUMS - DOSED IN MG ELEMENTAL CALCIUM ) 500 MG chewable tablet Chew 2 tablets by mouth 2 (two) times daily as needed for indigestion or heartburn.   Yes [provider]  camphor-menthol VIKKI) lotion Apply 1 application topically as needed for itching.   Yes [provider]  Cholecalciferol 25 MCG (1000 UT) capsule Take 1,000 Units by mouth daily.   Yes [provider]  cyclobenzaprine  (FLEXERIL ) 5 MG tablet Take 5 mg by mouth at bedtime.   Yes [provider]  docusate sodium  (COLACE) 100 MG capsule Take 100 mg by mouth daily as needed.   Yes [provider]  ezetimibe  (ZETIA ) 10 MG tablet Take 1 tablet (10 mg total) by mouth daily. 07/04/23 01/12/24 Yes Hammock, Tylene, NP  ferrous sulfate  325 (65 FE) MG tablet Take 1 tablet (325 mg total) by mouth every other day. 12/26/22 01/12/24 Yes Perri DELENA Meliton Mickey., MD  folic acid  (FOLVITE ) 1 MG tablet Take 1 mg by mouth daily.   Yes [provider]  furosemide  (LASIX ) 20 MG tablet Take one tablet daily as needed for weight gain of 2 lb overnight, swelling, or shortness of breath 11/05/23  Yes Hammock, Tylene, NP  gabapentin  (NEURONTIN ) 300 MG capsule Take 300 mg by mouth every evening. 11/30/22  Yes [provider]  HUMALOG  KWIKPEN 100 UNIT/ML KwikPen Inject 8 Units into the skin as needed (when glucose exceeds 120). 06/21/23  Yes  [provider]  HYDROcodone -acetaminophen  (NORCO/VICODIN) 5-325 MG tablet Take 1 tablet by mouth every 6 (six) hours as needed. 12/20/22  Yes Zimmerman, Donielle M, PA-C  ibuprofen  (ADVIL ) 800 MG tablet Take 800 mg by mouth every 8 (eight) hours as needed for mild pain (pain score 1-3) or moderate pain (pain score 4-6).   Yes [provider]  insulin  aspart (NOVOLOG  FLEXPEN) 100 UNIT/ML FlexPen Inject 8 Units into the skin every evening.   Yes [provider]  insulin  degludec (TRESIBA  FLEXTOUCH) 200 UNIT/ML FlexTouch Pen Inject 65 Units into the skin at bedtime.   Yes [provider]  isosorbide  mononitrate (IMDUR ) 30 MG 24 hr tablet Take 1 tablet (30 mg total) by mouth two times daily 11/05/23  Yes Hammock, Sheri, NP  JARDIANCE 25 MG TABS tablet Take 25 mg by mouth daily. 04/10/22  Yes [provider]  metFORMIN  (GLUCOPHAGE -XR) 500 MG 24 hr tablet Take 1,000  mg by mouth 2 (two) times daily. 01/03/24  Yes [provider]  methimazole  (TAPAZOLE ) 5 MG tablet Take 2.5 mg by mouth daily. Patient taking differently: Take 5 mg by mouth daily.   Yes [provider]  metoprolol  tartrate (LOPRESSOR ) 25 MG tablet Take 0.5 tablets by mouth 2 times daily. 12/04/23  Yes Hammock, Tylene, NP  mometasone  (NASONEX ) 50 MCG/ACT nasal spray Place 2 sprays into the nose daily as needed.   Yes [provider]  omeprazole (PRILOSEC) 40 MG capsule Take 40 mg by mouth daily. 04/16/23  Yes [provider]  PARoxetine  (PAXIL ) 40 MG tablet Take 1 tablet (40 mg total) by mouth every morning. 11/13/17  Yes Thomas, Lashunda, NP  potassium chloride  SA (KLOR-CON  M) 20 MEQ tablet Take one tablet daily as needed for a weight gain of 2 pounds overnight, swelling, or shortness of breath when you take the Furosemide  05/15/23  Yes Hammock, Sheri, NP  sertraline  (ZOLOFT ) 25 MG tablet Take 25 mg by mouth daily.   Yes [provider]  SPIRIVA  HANDIHALER 18 MCG  inhalation capsule Place 1 capsule into inhaler and inhale daily. 03/22/17  Yes [provider]  traZODone  (DESYREL ) 50 MG tablet Take 50 mg by mouth at bedtime. 08/14/21  Yes [provider]  VENTOLIN  HFA 108 (90 Base) MCG/ACT inhaler Inhale 2 puffs into the lungs every 6 (six) hours as needed. 03/26/17  Yes [provider]  Lancets (ONETOUCH DELICA PLUS LANCET33G) MISC Apply topically. 08/19/21   [provider]  metFORMIN  (GLUCOPHAGE ) 500 MG tablet Take 1,000 mg by mouth 2 (two) times daily with a meal. Patient not taking: Reported on 01/12/2024    [provider]  [facility] ULTRA test strip USE AS DIRECTED FOR BLOOD SUGAR TESTING THREE TIMES DAILY FOR DIABETES E11.9 08/19/21   [provider]    Physical Exam: Vitals:   01/12/24 0309 01/12/24 0330 01/12/24 0546 01/12/24 0838  BP: 99/63 102/72 115/64 128/62  Pulse: 74 75 76 84  Resp:   18 16  Temp:   97.7 F (36.5 C) 98.2 F (36.8 C)  TempSrc:    Oral  SpO2: 97% 98% 100% 96%  Weight:      Height:        Constitutional: NAD, calm, comfortable Vitals:   01/12/24 0309 01/12/24 0330 01/12/24 0546 01/12/24 0838  BP: 99/63 102/72 115/64 128/62  Pulse: 74 75 76 84  Resp:   18 16  Temp:   97.7 F (36.5 C) 98.2 F (36.8 C)  TempSrc:    Oral  SpO2: 97% 98% 100% 96%  Weight:      Height:       Eyes: PERRL, lids and conjunctivae normal ENMT: Mucous membranes are moist. Posterior pharynx clear of any exudate or lesions.Normal dentition.  Neck: normal, supple, no masses, no thyromegaly Respiratory: clear to auscultation bilaterally, no wheezing, no crackles. Normal respiratory effort. No accessory muscle use.  Cardiovascular: Regular rate and rhythm, no murmurs / rubs / gallops. No extremity edema. 2+ pedal pulses. No carotid bruits.  Abdomen: RUQ tenderness, no rebound no guarding, no masses palpated. No hepatosplenomegaly. Bowel sounds positive.  Musculoskeletal: no clubbing / cyanosis.  No joint deformity upper and lower extremities. Good ROM, no contractures. Normal muscle tone.  Skin: no rashes, lesions, ulcers. No induration Neurologic: CN 2-12 grossly intact. Sensation intact, DTR normal. Strength 5/5 in all 4.  Psychiatric: Normal judgment and insight. Alert and oriented x 3. Normal mood.  Labs on Admission: I have personally reviewed following labs and imaging studies  CBC: Recent Labs  Lab 01/11/24 1950  WBC 15.1*  NEUTROABS 12.6*  HGB 11.9*  HCT 36.3  MCV 84.2  PLT 262   Basic Metabolic Panel: Recent Labs  Lab 01/11/24 1950 01/11/24 2326  NA 139  --   K 2.8*  --   CL 101  --   CO2 24  --   GLUCOSE 214*  --   BUN 15  --   CREATININE 0.98  --   CALCIUM  8.7*  --   MG  --  1.7   GFR: Estimated Creatinine Clearance: 49.8 mL/min (by C-G formula based on SCr of 0.98 mg/dL). Liver Function Tests: Recent Labs  Lab 01/11/24 1950  AST 218*  ALT 52*  ALKPHOS 132*  BILITOT 2.4*  PROT 8.1  ALBUMIN  3.6   Recent Labs  Lab 01/11/24 2326  LIPASE 34   No results for input(s): AMMONIA in the last 168 hours. Coagulation Profile: No results for input(s): INR, PROTIME in the last 168 hours. Cardiac Enzymes: No results for input(s): CKTOTAL, CKMB, CKMBINDEX, TROPONINI in the last 168 hours. BNP (last 3 results) No results for input(s): PROBNP in the last 8760 hours. HbA1C: No results for input(s): HGBA1C in the last 72 hours. CBG: Recent Labs  Lab 01/12/24 0839  GLUCAP 73   Lipid Profile: No results for input(s): CHOL, HDL, LDLCALC, TRIG, CHOLHDL, LDLDIRECT in the last 72 hours. Thyroid  Function Tests: No results for input(s): TSH, T4TOTAL, FREET4, T3FREE, THYROIDAB in the last 72 hours. Anemia Panel: No results for input(s): VITAMINB12, FOLATE, FERRITIN, TIBC, IRON, RETICCTPCT in the last 72 hours. Urine analysis:    Component Value Date/Time   COLORURINE AMBER (A) 01/11/2024 1951    APPEARANCEUR CLEAR (A) 01/11/2024 1951   LABSPEC 1.027 01/11/2024 1951   PHURINE 5.0 01/11/2024 1951   GLUCOSEU >=500 (A) 01/11/2024 1951   HGBUR SMALL (A) 01/11/2024 1951   BILIRUBINUR NEGATIVE 01/11/2024 1951   KETONESUR NEGATIVE 01/11/2024 1951   PROTEINUR 30 (A) 01/11/2024 1951   NITRITE NEGATIVE 01/11/2024 1951   LEUKOCYTESUR NEGATIVE 01/11/2024 1951    Radiological Exams on Admission: MR ABDOMEN MRCP W WO CONTAST Result Date: 01/12/2024 CLINICAL DATA:  Right upper quadrant pain.  Cholelithiasis. EXAM: MRI ABDOMEN WITHOUT AND WITH CONTRAST (INCLUDING MRCP) TECHNIQUE: Multiplanar multisequence MR imaging of the abdomen was performed both before and after the administration of intravenous contrast. Heavily T2-weighted images of the biliary and pancreatic ducts were obtained, and three-dimensional MRCP images were rendered by post processing. CONTRAST:  7mL GADAVIST GADOBUTROL 1 MMOL/ML IV SOLN COMPARISON:  CT on 01/11/2024 FINDINGS: Lower chest: No acute findings. Hepatobiliary: A lesion is seen in the liver dome which shows ill-defined margins, mild peripheral T2 hyperintensity and contrast enhancement, as well as restricted diffusion. This is located in the upper liver, near the junction of the right and left lobes, and measures 2.7 x 2.6 cm on image 16/27. This is nonspecific, and differential diagnosis includes early abscess versus mass. Multiple small gallstones are seen. Gallbladder is distended and shows mild diffuse wall thickening, suspicious for cholecystitis. No evidence of biliary ductal dilatation or choledocholithiasis. Pancreas:  No mass or inflammatory changes. Spleen:  Within normal limits in size and appearance. Adrenals/Urinary Tract: No suspicious masses identified. No evidence of hydronephrosis. Stomach/Bowel: Unremarkable. Vascular/Lymphatic: No pathologically enlarged lymph nodes identified. No acute vascular findings. Other:  None. Musculoskeletal:  No suspicious bone lesions  identified. IMPRESSION: Distended gallbladder  with cholelithiasis and mild wall thickening, suspicious for acute cholecystitis. No evidence of biliary ductal dilatation or choledocholithiasis. 2.7 cm peripherally enhancing liver lesion, with differential diagnosis of abscess versus mass. Electronically Signed   By: Norleen DELENA Kil M.D.   On: 01/12/2024 04:17   MR 3D Recon At Scanner Result Date: 01/12/2024 CLINICAL DATA:  Right upper quadrant pain.  Cholelithiasis. EXAM: MRI ABDOMEN WITHOUT AND WITH CONTRAST (INCLUDING MRCP) TECHNIQUE: Multiplanar multisequence MR imaging of the abdomen was performed both before and after the administration of intravenous contrast. Heavily T2-weighted images of the biliary and pancreatic ducts were obtained, and three-dimensional MRCP images were rendered by post processing. CONTRAST:  7mL GADAVIST GADOBUTROL 1 MMOL/ML IV SOLN COMPARISON:  CT on 01/11/2024 FINDINGS: Lower chest: No acute findings. Hepatobiliary: A lesion is seen in the liver dome which shows ill-defined margins, mild peripheral T2 hyperintensity and contrast enhancement, as well as restricted diffusion. This is located in the upper liver, near the junction of the right and left lobes, and measures 2.7 x 2.6 cm on image 16/27. This is nonspecific, and differential diagnosis includes early abscess versus mass. Multiple small gallstones are seen. Gallbladder is distended and shows mild diffuse wall thickening, suspicious for cholecystitis. No evidence of biliary ductal dilatation or choledocholithiasis. Pancreas:  No mass or inflammatory changes. Spleen:  Within normal limits in size and appearance. Adrenals/Urinary Tract: No suspicious masses identified. No evidence of hydronephrosis. Stomach/Bowel: Unremarkable. Vascular/Lymphatic: No pathologically enlarged lymph nodes identified. No acute vascular findings. Other:  None. Musculoskeletal:  No suspicious bone lesions identified. IMPRESSION: Distended gallbladder with  cholelithiasis and mild wall thickening, suspicious for acute cholecystitis. No evidence of biliary ductal dilatation or choledocholithiasis. 2.7 cm peripherally enhancing liver lesion, with differential diagnosis of abscess versus mass. Electronically Signed   By: Norleen DELENA Kil M.D.   On: 01/12/2024 04:17   US  ABDOMEN LIMITED RUQ (LIVER/GB) Result Date: 01/12/2024 EXAM: Right Upper Quadrant Abdominal Ultrasound 01/12/2024 12:16:53 AM TECHNIQUE: Real-time ultrasonography of the right upper quadrant of the abdomen was performed. COMPARISON: CT 01/11/2024 CLINICAL HISTORY: Right sided abdominal pain. FINDINGS: LIVER: The liver demonstrates normal echogenicity. No intrahepatic biliary ductal dilatation. No evidence of mass. BILIARY SYSTEM: Gallstones noted within the gallbladder measuring up to 1 cm. The gallbladder is mildly distended. Mild gallbladder wall thickening measuring up to 7 mm. No pericholecystic fluid. Negative sonographic Murphy's sign. Common bile duct is within normal limits measuring 6 mm. RIGHT KIDNEY: The right kidney is grossly unremarkable in appearances without evidence of hydronephrosis, echogenic calculi or worrisome mass lesions. PANCREAS: Visualized portions of the pancreas are unremarkable. OTHER: No right upper quadrant ascites. IMPRESSION: 1. Cholelithiasis with mild gallbladder wall thickening and mild distention; negative sonographic Murphy sign. Findings may suggest chronic cholecystitis versus biliary colic in the appropriate setting. Recommend clinical correlation to exclude acute cholecystitis. 2. Common bile duct measures 6 mm, within normal limits for age if applicable. Electronically signed by: Franky Crease MD 01/12/2024 12:27 AM EDT RP Workstation: [id number]   CT CHEST ABDOMEN PELVIS W CONTRAST Result Date: 01/12/2024 EXAM: CT CHEST, ABDOMEN AND PELVIS WITH CONTRAST 01/11/2024 11:58:16 PM TECHNIQUE: CT of the chest, abdomen and pelvis was performed with the administration of  100 mL of iohexol  (OMNIPAQUE ) 300 MG/ML solution. Multiplanar reformatted images are provided for review. Automated exposure control, iterative reconstruction, and/or weight based adjustment of the mA/kV was utilized to reduce the radiation dose to as low as reasonably achievable. COMPARISON: 02/13/2022 CLINICAL HISTORY: Right sided abdominal pain, sob. FINDINGS: CHEST: MEDIASTINUM  AND LYMPH NODES: Heart and pericardium are unremarkable. Prior CABG. Aortic atherosclerosis. Multinodular thyroid  compatible with multinodular goiter. This has been previously evaluated by imaging. The central airways are clear. No mediastinal, hilar or axillary lymphadenopathy. LUNGS AND PLEURA: No focal consolidation or pulmonary edema. No pleural effusion or pneumothorax. ABDOMEN AND PELVIS: LIVER: The liver is unremarkable. GALLBLADDER AND BILE DUCTS: Small layering gallstones within the gallbladder. The gallbladder is moderately distended. No biliary ductal dilatation. SPLEEN: No acute abnormality. PANCREAS: No acute abnormality. ADRENAL GLANDS: No acute abnormality. KIDNEYS, URETERS AND BLADDER: No stones in the kidneys or ureters. No hydronephrosis. No perinephric or periureteral stranding. Urinary bladder is unremarkable. GI AND BOWEL: Stomach demonstrates no acute abnormality. There is no bowel obstruction. Normal appendix. REPRODUCTIVE ORGANS: Prior hysterectomy. PERITONEUM AND RETROPERITONEUM: No ascites. No free air. VASCULATURE: Aorta is normal in caliber. Aortic atherosclerosis. ABDOMINAL AND PELVIS LYMPH NODES: No lymphadenopathy. BONES AND SOFT TISSUES: No acute osseous abnormality. No focal soft tissue abnormality. IMPRESSION: 1. Cholelithiasis with moderately distended gallbladder without biliary ductal dilatation. This can be further evaluated with right upper quadrant ultrasound 2. No acute cardiopulmonary disease. Electronically signed by: Franky Crease MD 01/12/2024 12:12 AM EDT RP Workstation: [id number]   DG Chest  2 View Result Date: 01/11/2024 CLINICAL DATA:  Short of breath, right flank pain EXAM: CHEST - 2 VIEW COMPARISON:  01/09/2023 FINDINGS: Frontal and lateral views of the chest demonstrate postsurgical changes from median sternotomy. The cardiac silhouette is unremarkable. No acute airspace disease, effusion, or pneumothorax. No acute bony abnormalities. IMPRESSION: 1. No acute intrathoracic process. Electronically Signed   By: Ozell Daring M.D.   On: 01/11/2024 20:40    EKG: Independently reviewed.  Sinus rhythm, no acute ST changes, flattening T waves on multiple leads.  Assessment/Plan Principal Problem:   Chronic cholecystitis Active Problems:   Acute cholecystitis  (please populate well all problems here in Problem List. (For example, if patient is on BP meds at home and you resume or decide to hold them, it is a problem that needs to be her. Same for CAD, COPD, HLD and so on)  Acute cholecystitis Acute transaminitis Acute bilirubinemia - Patient goes to the OR for cholecystectomy this morning -MRCP negative for CBD obstruction - N.p.o. - Continue Zosyn - Repeat CMP tomorrow  Hypokalemia - IV and p.o. replacement - Recheck potassium level and supplement - Magnesium  = 1.7, 1 g of IV magnesium  given  Multivessel CAD status post CABG - No chest pain, troponin trending negative x 2, ACS ruled out - Continue aspirin , statin  HTN - Blood pressure borderline low, hold off home BP meds - As needed hydralazine   IDDM - SSI for now  DVT prophylaxis: Lovenox  Code Status: Full code Family Communication: Husband at bedside Disposition Plan: Expect more than 2 midnight hospital stay, patient is sick with acute cholecystitis requiring surgery and IV antibiotics. Consults called: General Surgeon Admission status: MedSurg admission   Cort ONEIDA Mana MD [facility] Pager [phone]  01/12/2024, 9:21 AM

## 2024-01-12 NOTE — H&P (Signed)
 Patient ID: Erica Estrada, female   DOB: January 02, 1951, 73 y.o.   MRN: [id number] CC: Biliary Colic/Chronic Cholecystitis/Acute Cholecystitis  History of Present Illness Erica Estrada is a 73 y.o. female with past medical history significant for coronary artery disease status post CABG in 2024, diabetes who presents in consultation for right upper quadrant pain and nausea.  The patient reports that on Friday, 2 days ago, she started to develop increased nausea.  Subsequently she developed right upper quadrant pain that that she describes as stabbing that did radiate down to her back.  She denies any episodes of vomiting.  She denies any fevers or chills.  She denies any previous history of pain similar to this.  She is on aspirin  but denies being on any blood thinners.  Abdominal surgical history significant for an abdominal hysterectomy..  Past Medical History Past Medical History:  Diagnosis Date   Arthritis    Asthma    Coronary artery calcification    Noted on prior CTA chest (01/2021)   Depression    Diabetes mellitus without complication (HCC)    Former tobacco use    GERD (gastroesophageal reflux disease)    Graves' disease    Headache    Hepatitis 1995   Hep C.  Treated.   Hyperlipidemia    Hypertension    Multiple thyroid  nodules    Precordial chest pain    MV 02/2021 low risk, no significant ischemia       Past Surgical History:  Procedure Laterality Date   ABDOMINAL HYSTERECTOMY     CORONARY ARTERY BYPASS GRAFT N/A 12/13/2022   Procedure: CORONARY ARTERY BYPASS GRAFTING (CABG) TIMES THREE UTILIZING LEFT INTERNAL MAMMARY ARTERY AND ENDOSCOPIC VEIN HARVEST RIGHT GREATER SAPHENOUS VEIN;  Surgeon: Lucas Dorise POUR, MD;  Location: [facility] OR;  Service: Open Heart Surgery;  Laterality: N/A;   EYE SURGERY Left    prosthetic eye put in   RIGHT/LEFT HEART CATH AND CORONARY ANGIOGRAPHY Bilateral 11/27/2022   Procedure: RIGHT/LEFT HEART CATH AND CORONARY ANGIOGRAPHY;  Surgeon:  Mady Bruckner, MD;  Location: [facility] INVASIVE CV LAB;  Service: Cardiovascular;  Laterality: Bilateral;   TEE WITHOUT CARDIOVERSION N/A 12/13/2022   Procedure: TRANSESOPHAGEAL ECHOCARDIOGRAM;  Surgeon: Lucas Dorise POUR, MD;  Location: [facility] OR;  Service: Open Heart Surgery;  Laterality: N/A;   TONSILLECTOMY     removed as a child    Allergies  Allergen Reactions   Zoster Vac Recomb Adjuvanted     Other Reaction(s): cellulits   Ace Inhibitors Cough    Chronic cough for 3 months. Trial off lisinopril starting 05/11/2011 effective in stopping cough.   Lisinopril Other (See Comments)   Fluticasone  Other (See Comments)    choking    Current Facility-Administered Medications  Medication Dose Route Frequency Provider Last Rate Last Admin   indocyanine green (IC-GREEN) injection 1.25 mg  1.25 mg Topical Once Marinda Jayson KIDD, MD       piperacillin-tazobactam (ZOSYN) IVPB 3.375 g  3.375 g Intravenous Q8H Marinda Jayson KIDD, MD        Family History Family History  Problem Relation Age of Onset   Breast cancer Neg Hx        Social History Social History   Tobacco Use   Smoking status: Former    Current packs/day: 0.00    Average packs/day: 0.5 packs/day for 33.0 years (16.5 ttl pk-yrs)    Types: Cigarettes    Start date: 08/11/1986    Quit date: 08/11/2019    Years  since quitting: 4.4   Smokeless tobacco: Never  Vaping Use   Vaping status: Never Used  Substance Use Topics   Alcohol use: No   Drug use: No        ROS Full ROS of systems performed and is otherwise negative there than what is stated in the HPI  Physical Exam Blood pressure 115/64, pulse 76, temperature 97.7 F (36.5 C), resp. rate 18, height 5' 5 (1.651 m), weight 73.5 kg, SpO2 100%.  Alert and oriented x 3, normal work of breathing room air, regular rate and rhythm, abdomen is soft, tender to palpation in the right upper quadrant without any rebound tenderness or guarding, moving all extremity spontaneously, lower  midline scar has healed well, sternal drain scars in the upper abdomen have also healed well.  Moving all extremities spontaneously.  Data Reviewed CT scan reviewed and she does have a distended gallbladder with some thickening of the wall.  There is some mild inflammation around this, labs significant for an increased bilirubin as well as a leukocytosis.  MRCP was ordered and no evidence of choledocholithiasis.  I have personally reviewed the patient's imaging and medical records.    Assessment    Patient with likely acute cholecystitis.  Differential includes chronic cholecystitis and biliary colic  Plan    Will plan for robotic assisted cholecystectomy today.  I discussed risk, benefits alternatives of the procedure including risk of infection, bleeding, retained stone, bile leak, injury to common bile duct and need for open procedure.  She understands these risks and wishes to proceed with surgery.  Started on Zosyn.    Jayson MALVA Endow 01/12/2024, 7:54 AM

## 2024-01-13 DIAGNOSIS — K810 Acute cholecystitis: Secondary | ICD-10-CM

## 2024-01-13 DIAGNOSIS — E119 Type 2 diabetes mellitus without complications: Secondary | ICD-10-CM

## 2024-01-13 DIAGNOSIS — I5032 Chronic diastolic (congestive) heart failure: Secondary | ICD-10-CM | POA: Diagnosis not present

## 2024-01-13 LAB — BASIC METABOLIC PANEL WITH GFR
Anion gap: 12 (ref 5–15)
BUN: 10 mg/dL (ref 8–23)
CO2: 24 mmol/L (ref 22–32)
Calcium: 8.6 mg/dL — ABNORMAL LOW (ref 8.9–10.3)
Chloride: 104 mmol/L (ref 98–111)
Creatinine, Ser: 0.77 mg/dL (ref 0.44–1.00)
GFR, Estimated: >60 mL/min (ref >60)
Glucose, Bld: 123 mg/dL — ABNORMAL HIGH (ref 70–99)
Potassium: 2.9 mmol/L — ABNORMAL LOW (ref 3.5–5.1)
Sodium: 140 mmol/L (ref 135–145)

## 2024-01-13 LAB — CBC
HCT: 33.3 % — ABNORMAL LOW (ref 36.0–46.0)
Hemoglobin: 10.9 g/dL — ABNORMAL LOW (ref 12.0–15.0)
MCH: 27.3 pg (ref 26.0–34.0)
MCHC: 32.7 g/dL (ref 30.0–36.0)
MCV: 83.5 fL (ref 80.0–100.0)
Platelets: 253 K/uL (ref 150–400)
RBC: 3.99 MIL/uL (ref 3.87–5.11)
RDW: 13.3 % (ref 11.5–15.5)
WBC: 14.1 K/uL — ABNORMAL HIGH (ref 4.0–10.5)
nRBC: 0 % (ref 0.0–0.2)

## 2024-01-13 LAB — GLUCOSE, CAPILLARY
Glucose-Capillary: 110 mg/dL — ABNORMAL HIGH (ref 70–99)
Glucose-Capillary: 188 mg/dL — ABNORMAL HIGH (ref 70–99)

## 2024-01-13 LAB — MAGNESIUM: Magnesium: 2.1 mg/dL (ref 1.7–2.4)

## 2024-01-13 MED ORDER — POTASSIUM CHLORIDE 10 MEQ/100ML IV SOLN
10.0000 meq | INTRAVENOUS | Status: AC
  Administered 2024-01-13 (×2): 10 meq via INTRAVENOUS
  Filled 2024-01-13 (×2): qty 100

## 2024-01-13 MED ORDER — POTASSIUM CHLORIDE CRYS ER 20 MEQ PO TBCR
40.0000 meq | EXTENDED_RELEASE_TABLET | ORAL | Status: AC
  Administered 2024-01-13 (×2): 40 meq via ORAL
  Filled 2024-01-13 (×2): qty 2

## 2024-01-13 MED ORDER — TRESIBA FLEXTOUCH 200 UNIT/ML ~~LOC~~ SOPN: 16.0000 [IU] | PEN_INJECTOR | Freq: Every day | SUBCUTANEOUS | Status: AC

## 2024-01-13 MED ORDER — OXYCODONE-ACETAMINOPHEN 5-325 MG PO TABS: 1.0000 | ORAL_TABLET | Freq: Four times a day (QID) | ORAL | 0 refills | Status: AC | PRN

## 2024-01-13 MED ORDER — POTASSIUM CHLORIDE 20 MEQ PO PACK: 40.0000 meq | PACK | ORAL | Status: DC

## 2024-01-13 MED ORDER — OXYCODONE-ACETAMINOPHEN 5-325 MG PO TABS
1.0000 | ORAL_TABLET | ORAL | Status: DC | PRN
Start: 2024-01-13 — End: 2024-01-13

## 2024-01-13 NOTE — TOC CM/SW Note (Signed)
 Transition of Care [facility]) - Inpatient Brief Assessment   Patient Details  Name: Wandra Babin MRN: [id number] Date of Birth: [birth year]  Transition of Care [facility]) CM/SW Contact:    Corean ONEIDA Haddock, RN Phone Number: 01/13/2024, 10:35 AM   Clinical Narrative:   Transition of Care [facility]) Screening Note   Patient Details  Name: Shadavia Dampier Date of Birth: 1950/05/11   Transition of Care [facility]) CM/SW Contact:    Corean ONEIDA Haddock, RN Phone Number: 01/13/2024, 10:35 AM    Transition of Care Department [facility]) has reviewed patient and no TOC needs have been identified at this time. . If new patient transition needs arise, please place a TOC consult.  Per SDOH transportation resources added to AVS  Transition of Care Asessment: Insurance and Status: Insurance coverage has been reviewed Patient has primary care physician: Yes     Prior/Current Home Services: Current home services Social Drivers of Health Review: SDOH reviewed interventions complete Readmission risk has been reviewed: Yes Transition of care needs: no transition of care needs at this time

## 2024-01-13 NOTE — Discharge Summary (Signed)
 Physician Discharge Summary   Patient: Erica Estrada MRN: [id number] DOB: 12/28/1950  Admit date:     01/11/2024  Discharge date: 01/13/24  Discharge Physician: Murvin Mana   PCP: Buren Rock HERO, MD   Recommendations at discharge:   Follow-up with PCP in 1 week. Follow-up for liver mass may refer to oncology if needed. Follow-up with general surgery as scheduled.  Discharge Diagnoses: Principal Problem:   Chronic cholecystitis Active Problems:   Hypertension   Diabetes mellitus, type 2 (HCC)   Chronic diastolic CHF (congestive heart failure) (HCC)   Acute cholecystitis  Resolved Problems:   * No resolved hospital problems. *  Hospital Course:  Erica Estrada is a 73 y.o. female with medical history significant of multivessel CAD status post CABG, HTN, IDDM, HLD, presented with worsening of RLQ abdominal pain.  He has significant leukocytosis, CT, MRI study consistent with cholecystitis with gallstone.  Patient had a cholecystectomy on 11/2.  Also giving IV antibiotics.  Condition improved after surgery, medically stable for discharge.  Assessment and Plan: Acute cholecystitis with cholelithiasis. Patient is status post cholecystectomy, doing well, abdominal pain essentially improved.  No need for additional antibiotics.  Medically stable for discharge.  Possible liver mass.   Incidental finding on MRI scan, need to follow-up with PCP as outpatient, may consider refer to oncology.  Type 2 diabetes. Glucose not significant elevated, patient was taking high-dose of Lantus , reduce the dose.  Follow-up with PCP as outpatient.  May consider increase dose again when glucose running high.  Essential hypertension. 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 Potassium 2.9, give 80 mEq of oral potassium and 20 mEq of IV potassium before discharge.  Patient was also on chronic potassium supplement.       Consultants: General  surgery Procedures performed: Cystectomy. Disposition: Home Diet recommendation:  Discharge Diet Orders (From admission, onward)     Start     Ordered   01/13/24 0000  Diet - low sodium heart healthy        01/13/24 1018           Cardiac diet DISCHARGE MEDICATION: Allergies as of 01/13/2024       Reactions   Zoster Vac Recomb Adjuvanted    Other Reaction(s): cellulits   Ace Inhibitors Cough   Chronic cough for 3 months. Trial off lisinopril starting 05/11/2011 effective in stopping cough.   Lisinopril Other (See Comments)   Fluticasone  Other (See Comments)   choking        Medication List     STOP taking these medications    amLODipine  10 MG tablet Commonly known as: NORVASC    HYDROcodone -acetaminophen  5-325 MG tablet Commonly known as: NORCO/VICODIN   ibuprofen  800 MG tablet Commonly known as: ADVIL        TAKE these medications    acetaminophen  500 MG tablet Commonly known as: TYLENOL  Take 1-2 tablets (500-1,000 mg total) by mouth every 6 (six) hours as needed (for pain). Maximum dose of of 4000 mg (8 tablets) in a 24 hour period. Do not take with your NORCO/VICODIN because they contain acetaminophen .   Aspercreme Lidocaine  4 % Crea Generic drug: Lidocaine  HCl Apply 1 application  topically 2 (two) times daily as needed (pain). Back and knees.   aspirin  EC 81 MG tablet Take 81 mg by mouth daily. Swallow whole.   atorvastatin  80 MG tablet Commonly known as: LIPITOR  Take 1 tablet (80 mg total) by mouth daily.   Biotene Dry Mouth Moisturizing  Soln as needed (dry mouth).   calcium  carbonate 500 MG chewable tablet Commonly known as: TUMS - dosed in mg elemental calcium  Chew 2 tablets by mouth 2 (two) times daily as needed for indigestion or heartburn.   camphor-menthol lotion Commonly known as: SARNA Apply 1 application topically as needed for itching.   Cholecalciferol 25 MCG (1000 UT) capsule Take 1,000 Units by mouth daily.   cyclobenzaprine   5 MG tablet Commonly known as: FLEXERIL  Take 5 mg by mouth at bedtime.   docusate sodium  100 MG capsule Commonly known as: COLACE Take 100 mg by mouth daily as needed.   ezetimibe  10 MG tablet Commonly known as: ZETIA  Take 1 tablet (10 mg total) by mouth daily.   FeroSul 325 (65 Fe) MG tablet Generic drug: ferrous sulfate  Take 1 tablet (325 mg total) by mouth every other day.   folic acid  1 MG tablet Commonly known as: FOLVITE  Take 1 mg by mouth daily.   furosemide  20 MG tablet Commonly known as: LASIX  Take one tablet daily as needed for weight gain of 2 lb overnight, swelling, or shortness of breath   gabapentin  300 MG capsule Commonly known as: NEURONTIN  Take 300 mg by mouth every evening.   HumaLOG  KwikPen 100 UNIT/ML KwikPen Generic drug: insulin  lispro Inject 8 Units into the skin as needed (when glucose exceeds 120).   isosorbide  mononitrate 30 MG 24 hr tablet Commonly known as: IMDUR  Take 1 tablet (30 mg total) by mouth two times daily   Jardiance 25 MG Tabs tablet Generic drug: empagliflozin Take 25 mg by mouth daily.   metFORMIN  500 MG 24 hr tablet Commonly known as: GLUCOPHAGE -XR Take 1,000 mg by mouth 2 (two) times daily. What changed: Another medication with the same name was removed. Continue taking this medication, and follow the directions you see here.   methimazole  5 MG tablet Commonly known as: TAPAZOLE  Take 2.5 mg by mouth daily. What changed: how much to take   metoprolol  tartrate 25 MG tablet Commonly known as: LOPRESSOR  Take 0.5 tablets by mouth 2 times daily.   mometasone  50 MCG/ACT nasal spray Commonly known as: NASONEX  Place 2 sprays into the nose daily as needed.   NovoLOG  FlexPen 100 UNIT/ML FlexPen Generic drug: insulin  aspart Inject 8 Units into the skin every evening.   omeprazole 40 MG capsule Commonly known as: PRILOSEC Take 40 mg by mouth daily.   OneTouch Delica Plus Lancet33G Misc Apply topically.   OneTouch Ultra  test strip Generic drug: glucose blood USE AS DIRECTED FOR BLOOD SUGAR TESTING THREE TIMES DAILY FOR DIABETES E11.9   oxyCODONE -acetaminophen  5-325 MG tablet Commonly known as: PERCOCET/ROXICET Take 1 tablet by mouth every 6 (six) hours as needed for moderate pain (pain score 4-6) or severe pain (pain score 7-10).   PARoxetine  40 MG tablet Commonly known as: PAXIL  Take 1 tablet (40 mg total) by mouth every morning.   potassium chloride  SA 20 MEQ tablet Commonly known as: KLOR-CON  M Take one tablet daily as needed for a weight gain of 2 pounds overnight, swelling, or shortness of breath when you take the Furosemide    sertraline  25 MG tablet Commonly known as: ZOLOFT  Take 25 mg by mouth daily.   Spiriva  HandiHaler 18 MCG Caps Generic drug: Tiotropium Bromide  Place 1 capsule into inhaler and inhale daily.   traZODone  50 MG tablet Commonly known as: DESYREL  Take 50 mg by mouth at bedtime.   Tresiba  FlexTouch 200 UNIT/ML FlexTouch Pen Generic drug: insulin  degludec Inject 16 Units  into the skin at bedtime. What changed: how much to take   Ventolin  HFA 108 (90 Base) MCG/ACT inhaler Generic drug: albuterol  Inhale 2 puffs into the lungs every 6 (six) hours as needed.        Follow-up Information     Schulz, Zachary R, PA-C Follow up in 2 week(s).   Specialty: Physician Assistant Contact information: [address] [place] [address] [phone]         Buren Rock HERO, MD Follow up in 1 week(s).   Specialty: Family Medicine Contact information: [address] [place] [address] [phone]                Discharge Exam: Erica Estrada   01/11/24 1940  Weight: 73.5 kg   General exam: Appears calm and comfortable  Respiratory system: Clear to auscultation. Respiratory effort normal. Cardiovascular system: S1 & S2 heard, RRR. No JVD, murmurs, rubs, gallops or clicks. No pedal edema. Gastrointestinal system: Abdomen is nondistended,  soft and nontender. No organomegaly or masses felt. Normal bowel sounds heard. Central nervous system: Alert and oriented. No focal neurological deficits. Extremities: Symmetric 5 x 5 power. Skin: No rashes, lesions or ulcers Psychiatry: Judgement and insight appear normal. Mood & affect appropriate.    Condition at discharge: good  The results of significant diagnostics from this hospitalization (including imaging, microbiology, ancillary and laboratory) are listed below for reference.   Imaging Studies: MR ABDOMEN MRCP W WO CONTAST Result Date: 01/12/2024 CLINICAL DATA:  Right upper quadrant pain.  Cholelithiasis. EXAM: MRI ABDOMEN WITHOUT AND WITH CONTRAST (INCLUDING MRCP) TECHNIQUE: Multiplanar multisequence MR imaging of the abdomen was performed both before and after the administration of intravenous contrast. Heavily T2-weighted images of the biliary and pancreatic ducts were obtained, and three-dimensional MRCP images were rendered by post processing. CONTRAST:  7mL GADAVIST GADOBUTROL 1 MMOL/ML IV SOLN COMPARISON:  CT on 01/11/2024 FINDINGS: Lower chest: No acute findings. Hepatobiliary: A lesion is seen in the liver dome which shows ill-defined margins, mild peripheral T2 hyperintensity and contrast enhancement, as well as restricted diffusion. This is located in the upper liver, near the junction of the right and left lobes, and measures 2.7 x 2.6 cm on image 16/27. This is nonspecific, and differential diagnosis includes early abscess versus mass. Multiple small gallstones are seen. Gallbladder is distended and shows mild diffuse wall thickening, suspicious for cholecystitis. No evidence of biliary ductal dilatation or choledocholithiasis. Pancreas:  No mass or inflammatory changes. Spleen:  Within normal limits in size and appearance. Adrenals/Urinary Tract: No suspicious masses identified. No evidence of hydronephrosis. Stomach/Bowel: Unremarkable. Vascular/Lymphatic: No pathologically  enlarged lymph nodes identified. No acute vascular findings. Other:  None. Musculoskeletal:  No suspicious bone lesions identified. IMPRESSION: Distended gallbladder with cholelithiasis and mild wall thickening, suspicious for acute cholecystitis. No evidence of biliary ductal dilatation or choledocholithiasis. 2.7 cm peripherally enhancing liver lesion, with differential diagnosis of abscess versus mass. Electronically Signed   By: Norleen DELENA Kil M.D.   On: 01/12/2024 04:17   MR 3D Recon At Scanner Result Date: 01/12/2024 CLINICAL DATA:  Right upper quadrant pain.  Cholelithiasis. EXAM: MRI ABDOMEN WITHOUT AND WITH CONTRAST (INCLUDING MRCP) TECHNIQUE: Multiplanar multisequence MR imaging of the abdomen was performed both before and after the administration of intravenous contrast. Heavily T2-weighted images of the biliary and pancreatic ducts were obtained, and three-dimensional MRCP images were rendered by post processing. CONTRAST:  7mL GADAVIST GADOBUTROL 1 MMOL/ML IV SOLN COMPARISON:  CT on 01/11/2024 FINDINGS: Lower chest:  No acute findings. Hepatobiliary: A lesion is seen in the liver dome which shows ill-defined margins, mild peripheral T2 hyperintensity and contrast enhancement, as well as restricted diffusion. This is located in the upper liver, near the junction of the right and left lobes, and measures 2.7 x 2.6 cm on image 16/27. This is nonspecific, and differential diagnosis includes early abscess versus mass. Multiple small gallstones are seen. Gallbladder is distended and shows mild diffuse wall thickening, suspicious for cholecystitis. No evidence of biliary ductal dilatation or choledocholithiasis. Pancreas:  No mass or inflammatory changes. Spleen:  Within normal limits in size and appearance. Adrenals/Urinary Tract: No suspicious masses identified. No evidence of hydronephrosis. Stomach/Bowel: Unremarkable. Vascular/Lymphatic: No pathologically enlarged lymph nodes identified. No acute vascular  findings. Other:  None. Musculoskeletal:  No suspicious bone lesions identified. IMPRESSION: Distended gallbladder with cholelithiasis and mild wall thickening, suspicious for acute cholecystitis. No evidence of biliary ductal dilatation or choledocholithiasis. 2.7 cm peripherally enhancing liver lesion, with differential diagnosis of abscess versus mass. Electronically Signed   By: Norleen DELENA Kil M.D.   On: 01/12/2024 04:17   US  ABDOMEN LIMITED RUQ (LIVER/GB) Result Date: 01/12/2024 EXAM: Right Upper Quadrant Abdominal Ultrasound 01/12/2024 12:16:53 AM TECHNIQUE: Real-time ultrasonography of the right upper quadrant of the abdomen was performed. COMPARISON: CT 01/11/2024 CLINICAL HISTORY: Right sided abdominal pain. FINDINGS: LIVER: The liver demonstrates normal echogenicity. No intrahepatic biliary ductal dilatation. No evidence of mass. BILIARY SYSTEM: Gallstones noted within the gallbladder measuring up to 1 cm. The gallbladder is mildly distended. Mild gallbladder wall thickening measuring up to 7 mm. No pericholecystic fluid. Negative sonographic Murphy's sign. Common bile duct is within normal limits measuring 6 mm. RIGHT KIDNEY: The right kidney is grossly unremarkable in appearances without evidence of hydronephrosis, echogenic calculi or worrisome mass lesions. PANCREAS: Visualized portions of the pancreas are unremarkable. OTHER: No right upper quadrant ascites. IMPRESSION: 1. Cholelithiasis with mild gallbladder wall thickening and mild distention; negative sonographic Murphy sign. Findings may suggest chronic cholecystitis versus biliary colic in the appropriate setting. Recommend clinical correlation to exclude acute cholecystitis. 2. Common bile duct measures 6 mm, within normal limits for age if applicable. Electronically signed by: Franky Crease MD 01/12/2024 12:27 AM EDT RP Workstation: [id number]   CT CHEST ABDOMEN PELVIS W CONTRAST Result Date: 01/12/2024 EXAM: CT CHEST, ABDOMEN AND PELVIS WITH  CONTRAST 01/11/2024 11:58:16 PM TECHNIQUE: CT of the chest, abdomen and pelvis was performed with the administration of 100 mL of iohexol  (OMNIPAQUE ) 300 MG/ML solution. Multiplanar reformatted images are provided for review. Automated exposure control, iterative reconstruction, and/or weight based adjustment of the mA/kV was utilized to reduce the radiation dose to as low as reasonably achievable. COMPARISON: 02/13/2022 CLINICAL HISTORY: Right sided abdominal pain, sob. FINDINGS: CHEST: MEDIASTINUM AND LYMPH NODES: Heart and pericardium are unremarkable. Prior CABG. Aortic atherosclerosis. Multinodular thyroid  compatible with multinodular goiter. This has been previously evaluated by imaging. The central airways are clear. No mediastinal, hilar or axillary lymphadenopathy. LUNGS AND PLEURA: No focal consolidation or pulmonary edema. No pleural effusion or pneumothorax. ABDOMEN AND PELVIS: LIVER: The liver is unremarkable. GALLBLADDER AND BILE DUCTS: Small layering gallstones within the gallbladder. The gallbladder is moderately distended. No biliary ductal dilatation. SPLEEN: No acute abnormality. PANCREAS: No acute abnormality. ADRENAL GLANDS: No acute abnormality. KIDNEYS, URETERS AND BLADDER: No stones in the kidneys or ureters. No hydronephrosis. No perinephric or periureteral stranding. Urinary bladder is unremarkable. GI AND BOWEL: Stomach demonstrates no acute abnormality. There is no bowel obstruction. Normal appendix. REPRODUCTIVE ORGANS:  Prior hysterectomy. PERITONEUM AND RETROPERITONEUM: No ascites. No free air. VASCULATURE: Aorta is normal in caliber. Aortic atherosclerosis. ABDOMINAL AND PELVIS LYMPH NODES: No lymphadenopathy. BONES AND SOFT TISSUES: No acute osseous abnormality. No focal soft tissue abnormality. IMPRESSION: 1. Cholelithiasis with moderately distended gallbladder without biliary ductal dilatation. This can be further evaluated with right upper quadrant ultrasound 2. No acute  cardiopulmonary disease. Electronically signed by: Franky Crease MD 01/12/2024 12:12 AM EDT RP Workstation: [id number]   DG Chest 2 View Result Date: 01/11/2024 CLINICAL DATA:  Short of breath, right flank pain EXAM: CHEST - 2 VIEW COMPARISON:  01/09/2023 FINDINGS: Frontal and lateral views of the chest demonstrate postsurgical changes from median sternotomy. The cardiac silhouette is unremarkable. No acute airspace disease, effusion, or pneumothorax. No acute bony abnormalities. IMPRESSION: 1. No acute intrathoracic process. Electronically Signed   By: Ozell Daring M.D.   On: 01/11/2024 20:40    Microbiology: Results for orders placed or performed during the hospital encounter of 01/11/24  Blood culture (routine x 2)     Status: None (Preliminary result)   Collection Time: 01/11/24 11:44 PM   Specimen: BLOOD  Result Value Ref Range Status   Specimen Description BLOOD BLOOD RIGHT ARM  Final   Special Requests   Final    BOTTLES DRAWN AEROBIC AND ANAEROBIC Blood Culture adequate volume   Culture   Final    NO GROWTH 2 DAYS Performed at [facility], [address] [place], [place] [address]    Report Status PENDING  Incomplete  Blood culture (routine x 2)     Status: None (Preliminary result)   Collection Time: 01/11/24 11:45 PM   Specimen: BLOOD  Result Value Ref Range Status   Specimen Description BLOOD BLOOD RIGHT ARM  Final   Special Requests   Final    BOTTLES DRAWN AEROBIC AND ANAEROBIC Blood Culture results may not be optimal due to an inadequate volume of blood received in culture bottles   Culture   Final    NO GROWTH 2 DAYS Performed at [facility], [address] [place], [place] [address]    Report Status PENDING  Incomplete    Labs: CBC: Recent Labs  Lab 01/11/24 1950 01/13/24 0447  WBC 15.1* 14.1*  NEUTROABS 12.6*  --   HGB 11.9* 10.9*  HCT 36.3 33.3*  MCV 84.2 83.5  PLT 262 253   Basic Metabolic Panel: Recent Labs  Lab  01/11/24 1950 01/11/24 2326 01/12/24 1444 01/13/24 0756  NA 139  --   --  140  K 2.8*  --  3.0* 2.9*  CL 101  --   --  104  CO2 24  --   --  24  GLUCOSE 214*  --   --  123*  BUN 15  --   --  10  CREATININE 0.98  --   --  0.77  CALCIUM  8.7*  --   --  8.6*  MG  --  1.7  --  2.1   Liver Function Tests: Recent Labs  Lab 01/11/24 1950  AST 218*  ALT 52*  ALKPHOS 132*  BILITOT 2.4*  PROT 8.1  ALBUMIN  3.6   CBG: Recent Labs  Lab 01/12/24 1054 01/12/24 1251 01/12/24 1833 01/12/24 2155 01/13/24 0756  GLUCAP 179* 215* 188* 212* 110*    Discharge time spent: 35 minutes.  Signed: Murvin Mana, MD [facility] 01/13/2024

## 2024-01-13 NOTE — Hospital Course (Addendum)
 Erica Estrada is a 73 y.o. female with medical history significant of multivessel CAD status post CABG, HTN, IDDM, HLD, presented with worsening of RLQ abdominal pain.  He has significant leukocytosis, CT, MRI study consistent with cholecystitis with gallstone.  Patient had a cholecystectomy on 11/2.  Also giving IV antibiotics.  Condition improved after surgery, medically stable for discharge.  Assessment and Plan: Acute cholecystitis with cholelithiasis. Patient is status post cholecystectomy, doing well, abdominal pain essentially improved.  No need for additional antibiotics.  Medically stable for discharge.  Possible liver mass.   Incidental finding on MRI scan, need to follow-up with PCP as outpatient, may consider refer to oncology.  Type 2 diabetes. Glucose not significant elevated, patient was taking high-dose of Lantus , reduce the dose.  Follow-up with PCP as outpatient.  May consider increase dose again when glucose running high.  Essential hypertension. Blood pressure not significant elevated,

## 2024-01-13 NOTE — Progress Notes (Signed)
 Patient is doing well this morning.  Tolerating a diet.  She says that she has also had flatus.  Pain is well-controlled.  On exam abdomen soft, appropriately tender over incisions and incisions are dressed with glue.  There is no erythema or evidence of infection.  Labs reviewed and she has an improving white count.  BMP within normal limits.  She will complete 24 hours of perioperative antibiotics today.  Appropriate for discharge.  She can follow-up in our office in 2 weeks.

## 2024-01-13 NOTE — Discharge Instructions (Addendum)
 You may shower today.  Please let the warm water run of your incisions and pat dry.  You may take Tylenol  and ibuprofen  as needed for pain.  Please do not submerge the wounds in water for 2 weeks.  No heavy lifting greater than 10 to 15 pounds for 4 weeks.    Transportation Resources for [employer]  Agency Name: [facility] Agency Address: [address] [place], [place] [address] Phone: [phone] Email: [email or web address] Website: [email or web address] Service(s) Offered: Housing services, self-sufficiency, congregate meal program, weatherization program, field seismologist program, emergency food assistance,  housing counseling, home ownership program, wheels-towork program.  Agency Name: [facility] [employer] [phone]) Address: [address] [place], [place] [address] Phone: [phone] Website: [email or web address] Service(s) Offered: Transportation for bluelinx, subscription and demand response; Dial-a-Ride for citizens 3 years of age or older.  Agency Name: Department of Social Services Address: [address] [place], [place] [address] Phone: [phone] Service(s) Offered: Child support services; child welfare services; food stamps; Medicaid; work first family assistance; and aid with fuel,  rent, food and medicine, transportation assistance.  Agency Name: Disabled [employer] ([employer]) Transportation  Network Phone: [phone] Service(s) Offered: Transports veterans to the [facility] medical center. Call  forty-eight hours in advance and leave the name, telephone  number, date, and time of appointment. Veteran will be  contacted by the driver the day before the appointment to  arrange a pick up point    [employer] currently provides door to door services. [employer]A connects with PART daily for services to [facility]. [employer] also performs  contract services to [employer] operates 27 vehicles, all but 3 mini-vans are equipped with lifts for special needs as well as the general public. [employer] drivers are each CDL certified and trained in First Aid and CPR. [employer] was established in 2002 by [employer]. An independent Industrial/product Designer. [employer] operates via cytogeneticist with required local 10% match funding from [place]. [employer] provides over 80,000 passenger trips each year, including [facility] and [employer] sites.  Call at least by 11 AM one business day prior to needing transportation  [employer].                      [place], [place] [address]     Office Hours: Monday-Friday  8 AM - 5 PM

## 2024-01-14 LAB — SURGICAL PATHOLOGY

## 2024-01-14 NOTE — Anesthesia Postprocedure Evaluation (Signed)
 Anesthesia Post Note  Patient: Erica Estrada  Procedure(s) Performed: CHOLECYSTECTOMY, ROBOT-ASSISTED, LAPAROSCOPIC (Abdomen)  Patient location during evaluation: PACU Anesthesia Type: General Level of consciousness: awake and alert Pain management: pain level controlled Vital Signs Assessment: post-procedure vital signs reviewed and stable Respiratory status: spontaneous breathing, nonlabored ventilation, respiratory function stable and patient connected to nasal cannula oxygen Cardiovascular status: blood pressure returned to baseline and stable Postop Assessment: no apparent nausea or vomiting Anesthetic complications: no   No notable events documented.   Last Vitals:  Vitals:   01/13/24 0533 01/13/24 0743  BP: 129/75 125/72  Pulse: 77 74  Resp: 20 18  Temp: 36.8 C 36.8 C  SpO2: 96% 94%    Last Pain:  Vitals:   01/13/24 0745  TempSrc:   PainSc: 0-No pain                 Lynwood KANDICE Clause

## 2024-01-16 LAB — CULTURE, BLOOD (ROUTINE X 2)
Culture: NO GROWTH
Culture: NO GROWTH
Special Requests: ADEQUATE

## 2024-01-29 ENCOUNTER — Encounter: Payer: Self-pay | Admitting: Physician Assistant

## 2024-01-29 ENCOUNTER — Encounter: Admitting: Physician Assistant

## 2024-01-29 ENCOUNTER — Ambulatory Visit (INDEPENDENT_AMBULATORY_CARE_PROVIDER_SITE_OTHER): Admitting: Physician Assistant

## 2024-01-29 VITALS — BP 112/63 | HR 60 | Ht 65.0 in | Wt 158.0 lb

## 2024-01-29 DIAGNOSIS — K810 Acute cholecystitis: Secondary | ICD-10-CM

## 2024-01-29 DIAGNOSIS — Z09 Encounter for follow-up examination after completed treatment for conditions other than malignant neoplasm: Secondary | ICD-10-CM

## 2024-01-29 NOTE — Patient Instructions (Signed)

## 2024-01-29 NOTE — Progress Notes (Signed)
 [facility] POST-OP OFFICE VISIT  01/29/2024  HPI: Erica Estrada is a 73 y.o. female 17 days s/p robotic assisted laparoscopic cholecystectomy for cholecystitis with Dr Marinda   She has done very well Minimal to no abdominal pain at this point No fever, chills, nausea, emesis She is tolerating PO; no diarrhea Incisions are well healed She is ambulatory No other complaints   Vital signs: BP 112/63   Pulse 60   Ht 5' 5 (1.651 m)   Wt 158 lb (71.7 kg)   SpO2 98%   BMI 26.29 kg/m    Physical Exam: Constitutional: Well appearing female, NAD Abdomen: Soft, non-tender, non-distended, no rebound/guarding Skin: Laparoscopic incisions are healing well, no erythema or drainage   Assessment/Plan: This is a 73 y.o. female 17 days s/p robotic assisted laparoscopic cholecystectomy for cholecystitis with Dr Marinda    - Pain control prn  - Reviewed wound care recommendation  - Reviewed lifting restrictions; 4 weeks total  - Reviewed surgical pathology; Acute on chronic cholecystitis, focal areas of gangrene   - She can follow up on as needed basis; She understands to call with questions/concerns  -- Erica Platt, PA-C [facility] 01/29/2024, 2:49 PM M-F: 7am - 4pm

## 2024-02-03 ENCOUNTER — Ambulatory Visit: Attending: Cardiology | Admitting: Cardiology

## 2024-02-03 ENCOUNTER — Encounter: Payer: Self-pay | Admitting: Cardiology

## 2024-02-03 VITALS — BP 122/64 | HR 56 | Ht 65.0 in | Wt 158.8 lb

## 2024-02-03 DIAGNOSIS — E118 Type 2 diabetes mellitus with unspecified complications: Secondary | ICD-10-CM | POA: Diagnosis present

## 2024-02-03 DIAGNOSIS — I2510 Atherosclerotic heart disease of native coronary artery without angina pectoris: Secondary | ICD-10-CM | POA: Diagnosis not present

## 2024-02-03 DIAGNOSIS — I10 Essential (primary) hypertension: Secondary | ICD-10-CM | POA: Insufficient documentation

## 2024-02-03 DIAGNOSIS — E785 Hyperlipidemia, unspecified: Secondary | ICD-10-CM | POA: Diagnosis present

## 2024-02-03 DIAGNOSIS — Z794 Long term (current) use of insulin: Secondary | ICD-10-CM | POA: Insufficient documentation

## 2024-02-03 DIAGNOSIS — Z951 Presence of aortocoronary bypass graft: Secondary | ICD-10-CM | POA: Diagnosis not present

## 2024-02-03 DIAGNOSIS — Z79899 Other long term (current) drug therapy: Secondary | ICD-10-CM | POA: Insufficient documentation

## 2024-02-03 DIAGNOSIS — E876 Hypokalemia: Secondary | ICD-10-CM | POA: Insufficient documentation

## 2024-02-03 DIAGNOSIS — D500 Iron deficiency anemia secondary to blood loss (chronic): Secondary | ICD-10-CM | POA: Diagnosis present

## 2024-02-03 DIAGNOSIS — G63 Polyneuropathy in diseases classified elsewhere: Secondary | ICD-10-CM | POA: Insufficient documentation

## 2024-02-03 NOTE — Patient Instructions (Signed)
 Medication Instructions:  Your physician recommends that you continue on your current medications as directed. Please refer to the Current Medication list given to you today.   *If you need a refill on your cardiac medications before your next appointment, please call your pharmacy*  Lab Work: Your provider would like for you to have following labs drawn today lipid and cmp.   If you have labs (blood work) drawn today and your tests are completely normal, you will receive your results only by: MyChart Message (if you have MyChart) OR A paper copy in the mail If you have any lab test that is abnormal or we need to change your treatment, we will call you to review the results.  Testing/Procedures: No test ordered today   Follow-Up: At [facility], you and your health needs are our priority.  As part of our continuing mission to provide you with exceptional heart care, our providers are all part of one team.  This team includes your primary Cardiologist (physician) and Advanced Practice Providers or APPs (Physician Assistants and Nurse Practitioners) who all work together to provide you with the care you need, when you need it.  Your next appointment:   6 month(s)  Provider:   You may see Redell Cave, MD or one of the following Advanced Practice Providers on your designated Care Team:   Tylene Lunch, NP   We recommend signing up for the patient portal called MyChart.  Sign up information is provided on this After Visit Summary.  MyChart is used to connect with patients for Virtual Visits (Telemedicine).  Patients are able to view lab/test results, encounter notes, upcoming appointments, etc.  Non-urgent messages can be sent to your provider as well.   To learn more about what you can do with MyChart, go to [email or web address].

## 2024-02-03 NOTE — Progress Notes (Signed)
 Cardiology Office Note   Date:  02/03/2024  ID:  Erica Estrada, Erica Estrada 1951/01/10, MRN [id number] PCP: Buren Rock HERO, MD  [facility] Providers Cardiologist:  Redell Cave, MD     History of Present Illness Erica Estrada is a 73 y.o. female with past medical history of coronary artery disease status post CABG x 3 (12/30/2022), hypertension, type 2 diabetes, former smoker x 20+ years, hyperlipidemia, anemia, who presents today for follow-up of coronary artery disease.   Previous echocardiogram revealed LVEF 60-65%.  Lexiscan  Myoview  did not show any ischemia.  CT angio of the chest in 11/22 showed three-vessel coronary artery calcifications.  Repeat Lexiscan  09/2022 showed findings consistent with ischemia on the study was an indeterminate risk. Left heart catheterization completed 11/27/2022 revealed severe three-vessel coronary artery disease, normal left heart filling pressures, mildly elevated right heart and pulmonary artery pressures with normal Fick cardiac output/index.  She was referred to cardiac surgery for CABG evaluation in the setting of three-vessel coronary artery disease and diabetes.  Her isosorbide  mononitrate was increased to 30 mg daily for additional antianginal therapy and continue with aggressive secondary prevention of coronary artery disease. Echocardiogram 11/27/2022 showed ejection fraction 55 to 60%, G1 DD, mild mitral regurgitation.  She was considered stable for discharge and was discharged home on 11/27/2022.  She was to return to [facility] on Thursday 12/13/22 for surgery.   Patient underwent coronary artery bypass grafting x 3 with LIMA-> LAD, SVG-> PDA, SVG-> diagonal.  Saphenous vein harvest was completed from the right lower extremity.  There was spontaneous return of sinus rhythm after cross-clamp removal with the time of 77 minutes.  The distal and proximal anastomosis were checked for hemostasis.  Position of the graft was  satisfactory.  She had 2 temporary epicardial pacing wires that were placed in the right atrium and 2 on the right ventricle.  The patient was weaned from CPB without difficulty on no inotropes.  Cardiac output was 5 L/min.  TEE showed normal LV systolic function.  Heparin  was fully reversed with protamine  and the aortic and venous cannulas were removed.  Hemostasis was achieved.  Mediastinal and bilateral pleural drainage tubes were placed the sternum was closed in the usual fashion.  She was transported to the surgical intensive care unit in stable condition.  She did not require inotropic/pressor support postoperatively.  She underwent removal of mediastinal chest tubes, arterial line, and Swan on postop day 1.  She was transitioned off of insulin  drip to Levemir  and sliding scale coverage.  She was on Jardiance, insulin , and metformin  prior to surgery.  Metformin  was restarted on postop day 4.  Jardiance was needed to be restarted at discharge.  Her preop hemoglobin A1c was 8.2.  Which would require close medical follow-up after discharge.  She remained in sinus rhythm and was stable for transfer to progressive care unit on 12/16/2022.  PT and OT evaluations were obtained and they recommended home health PT and OT.  She needed a lot of encouragement to ambulate.  She had mild volume overload and was diuresed accordingly.  Epicardial pacing wires were removed on 12/18/2022.  She had increased appetite was tolerated the diet and with administration of laxatives was able to have a bowel movement.  She was able to ambulate on room air with good oxygenation.  All her wounds were clean, dry, and healing without signs of infection.  Ambulation improved and she was felt stable for discharge on 12/20/22.she presented back to  [name] emergency department 12/24/2010 with complaint of chest pain.  High-sensitivity troponin 45 and 46.  There was concern for pericarditis and she was treated with colchicine  and ibuprofen   repeat echocardiogram revealed LVEF of 55 to 60%, G1 DD, some regional wall motion abnormalities were noted suggestive of postoperative septal changes. Without concerns for valvular abnormalities. Sed rate was elevated at 92. She had noted improvement with NSAIDs and anti-inflammatories and was able to be discharged on 12/26/2022.  She had recently been evaluated in the ER in [facility] in February 2025 with complaints of right-sided chest pain.  Workup was unrevealing.  She had also been scheduled for ABIs with pain and burning to her bilateral lower extremities without swelling.   She was last seen in clinic 06/26/23 with continued concerns of shortness of breath with changes on last visit, worsening fatigue, occasional swelling to her bilateral lower extremities.  States that her weights have remained stable.  Denies any recurrent chest pain, palpitations, lightheadedness.  States she has been compliant with her current medication regimen, stating that her medications a bubble packed for convenience.   She was last seen in clinic 08/07/2023 accompanied by family member.  She continues to do well from a cardiac perspective she complained of leg burning and tingling to the bottom of her feet from her neuropathy.  She was continued on her current medication regimen without changes being made or further testing being ordered.   Recently hospitalized at [facility] 11/1 - 01/13/2024 for chronic cholecystitis.  She presented to the emergency department with worsening right lower quadrant abdominal pain with significant leukocytosis and a CT MRI study consistent with cholecystitis with gallstones.  She had a cholecystectomy on 11/2.  She was treated with IV antibiotics was continued on her current medication regimen.  She did suffer from hypokalemia during her recent hospitalization and had to have her electrolytes replaced.  She returns to clinic today stating that she has been doing well from a cardiac perspective.   She states that she was hospitalized and recently had her gallbladder removed but she said no issues or complications after that.  During her hospitalization she was noted to have hypokalemia and had to be given potassium supplementation.  She does continue to complain of leg pain and burning and tingling to the bottom of her feet from neuropathy.  She states she has been compliant with her current medication regimen without any undue side effects.  ROS: 10 point review of system has been reviewed and considered negative except was been listed in the HPI  Studies Reviewed      2D echo 05/09/2023 1. Left ventricular ejection fraction, by estimation, is 55 to 60%. The  left ventricle has normal function. The left ventricle has no regional  wall motion abnormalities. Left ventricular diastolic parameters are  consistent with Grade I diastolic  dysfunction (impaired relaxation).   2. Right ventricular systolic function is normal. The right ventricular  size is normal. Tricuspid regurgitation signal is inadequate for assessing  PA pressure.   3. The mitral valve is normal in structure. Trivial mitral valve  regurgitation. No evidence of mitral stenosis.   4. The aortic valve is normal in structure. There is mild calcification  of the aortic valve. Aortic valve regurgitation is not visualized. No  aortic stenosis is present.   5. The inferior vena cava is normal in size with greater than 50%  respiratory variability, suggesting right atrial pressure of 3 mmHg.    TTE 12/25/22 1.  Left ventricular ejection fraction, by estimation, is 55 to 60%. The  left ventricle has normal function. The left ventricle demonstrates  regional wall motion abnormalities (see scoring diagram/findings for  description). Left ventricular diastolic  parameters are consistent with Grade I diastolic dysfunction (impaired  relaxation). Wall motion suggestive of post-operative septal changes.   2. Right ventricular  systolic function is normal. The right ventricular  size is normal. There is normal pulmonary artery systolic pressure.   3. The mitral valve is normal in structure. No evidence of mitral valve  regurgitation. No evidence of mitral stenosis.   4. The aortic valve is tricuspid. There is mild calcification of the  aortic valve. There is mild thickening of the aortic valve. Aortic valve  regurgitation is trivial. Aortic valve sclerosis is present, with no  evidence of aortic valve stenosis.   5. Cannot exclude a small PFO.    TEE Intraoperative 12/13/22 _ Left Ventricle: The left ventricle is unchanged from pre-bypass. has  normal  systolic function, with an ejection fraction of 60%. The cavity size was  normal.  The wall motion is normal.  _ Right Ventricle: The right ventricle appears unchanged from pre-bypass  normal  function.  _ Aorta: The aorta appears unchanged from pre-bypass.  _ Left Atrial Appendage: The left atrial appendage appears unchanged from  pre-bypass.  _ Aortic Valve: The aortic valve appears unchanged from pre-bypass. No  stenosis  present. There is no regurgitation.  _ Mitral Valve: There is trivial-mild regurgitation.  _ Tricuspid Valve: There is mild regurgitation.  _ Pulmonic Valve: The pulmonic valve appears unchanged from pre-bypass.  _ Interatrial Septum: The interatrial septum appears unchanged from  pre-bypass.  _ Pericardium: The pericardium appears unchanged from pre-bypass.  _ Comments: Post-bypass images reviewed with surgeon.    TTE 11/27/22 1. Left ventricular ejection fraction, by estimation, is 55 to 60%. The  left ventricle has normal function. The left ventricle has no regional  wall motion abnormalities. Left ventricular diastolic parameters are  consistent with Grade I diastolic  dysfunction (impaired relaxation).   2. Right ventricular systolic function is normal. The right ventricular  size is normal. Tricuspid regurgitation signal is  inadequate for assessing  PA pressure.   3. The mitral valve is degenerative. Mild mitral valve regurgitation. No  evidence of mitral stenosis.   4. The aortic valve is tricuspid. There is mild thickening of the aortic  valve. Aortic valve regurgitation is not visualized. Aortic valve  sclerosis is present, with no evidence of aortic valve stenosis.    R/LHC 11/27/22 Conclusions: Severe three-vessel coronary artery disease, as detailed below. Normal left heart filling pressures. Mildly elevated right heart and pulmonary artery pressures. Normal Fick cardiac output/index.   Recommendations: Refer to cardiac surgery for CABG evaluation in the setting of three-vessel coronary artery disease and diabetes mellitus. Obtain echocardiogram prior to discharge today. Increase isosorbide  mononitrate to 30 mg daily for additional antianginal therapy. Aggressive secondary prevention of coronary artery disease. Lexiscan  MPI 10/09/22   Findings are consistent with ischemia. The study is intermediate risk.   No ST deviation was noted.   LV perfusion is abnormal. There is evidence of ischemia. There is evidence of infarction. Defect 1: There is a medium defect with moderate reduction in uptake present in the mid to basal anterior location(s) that is partially reversible. There is normal wall motion in the defect area. Consistent with ischemia.   Left ventricular function is normal. End diastolic cavity size is normal. End systolic  cavity size is normal.   Unfortunately, this is a very suboptimal study due to intense GI uptake at the border of the inferior wall.  The anterior wall reversible defect might be due to artifact.   CT attenuation images show evidence of aortic and coronary calcifications.   02/10/2021- Lexiscan  MPI Pharmacological myocardial perfusion imaging study with no significant  ischemia Normal wall motion, EF estimated at 68% No EKG changes concerning for ischemia at peak stress or in  recovery. CT attenuation correction images with mild aortic atherosclerosis, mild coronary calcification Low risk scan   01/22/21-TTE 1. Left ventricular ejection fraction, by estimation, is 60 to 65%. The  left ventricle has normal function. The left ventricle has no regional  wall motion abnormalities. Left ventricular diastolic parameters were  normal.   2. Right ventricular systolic function is normal. The right ventricular  size is normal.   3. The mitral valve is normal in structure. Trivial mitral valve  regurgitation.   4. The aortic valve is normal in structure. Aortic valve regurgitation is  not visualized.   Risk Assessment/Calculations           Physical Exam VS:  BP 122/64   Pulse (!) 56   Ht 5' 5 (1.651 m)   Wt 158 lb 12.8 oz (72 kg)   SpO2 95%   BMI 26.43 kg/m        Wt Readings from Last 3 Encounters:  02/03/24 158 lb 12.8 oz (72 kg)  01/29/24 158 lb (71.7 kg)  01/11/24 162 lb (73.5 kg)    GEN: Well nourished, well developed in no acute distress NECK: No JVD; No carotid bruits CARDIAC: RRR, no murmurs, rubs, gallops RESPIRATORY:  Clear to auscultation without rales, wheezing or rhonchi  ABDOMEN: Soft, non-tender, non-distended EXTREMITIES:  No edema; No deformity   ASSESSMENT AND PLAN Coronary artery disease status post CABG x 3 vessels (12/30/2022).  She denies any chest discomfort today.  States that she has been doing well without having any anginal or anginal equivalents.  She has been continued on aspirin  81 mg daily, atorvastatin  80 mg daily, ezetimibe  10 mg daily, Toprol  tartrate 25 mg twice daily and Imdur  30 mg twice daily.  No further ischemic evaluation or testing is needed at this time.  Primary hypertension with a blood pressure today 122/64.  Blood pressures remain stable.  She is continued on metoprolol , Imdur , and amlodipine .  She has been encouraged to continue to monitor pressures 1 to 2 hours postmedication administration at home as  well.  Mixed hyperlipidemia with a last direct LDL of 86.  She has been sent for updated labs today.  She is continued on atorvastatin  80 mg daily and ezetimibe  10 mg daily.  If she continues to remain above goal of 53 we will refer her to Pharm D for potential Repatha or Praluent injections.  Type 2 diabetes with last hemoglobin A1c of 7.2.  She is continued on multiple diabetic medications.  Ongoing management per PCP.  Peripheral neuropathy where she has been continued on Neurontin .  She previously had ABIs that showed mild stenosis.  Recommendation was to repeat in 12 months.  She will need an ABI in February.  Hypokalemia noted during recent hospitalization with a serum potassium of 2.9.  She was treated with oral and IV potassium supplements.  She has been sent for an updated BMP today for further evaluation.        Dispo: Patient to return to clinic to see MD/APP in  6 months or sooner if needed for further evaluation  Signed, Kataleya Zaugg, NP

## 2024-02-04 ENCOUNTER — Ambulatory Visit: Payer: Self-pay | Admitting: Cardiology

## 2024-02-04 LAB — COMPREHENSIVE METABOLIC PANEL WITH GFR
ALT: 6 IU/L (ref 0–32)
AST: 23 IU/L (ref 0–40)
Albumin: 3.7 g/dL — ABNORMAL LOW (ref 3.8–4.8)
Alkaline Phosphatase: 107 IU/L (ref 49–135)
BUN/Creatinine Ratio: 11 — ABNORMAL LOW (ref 12–28)
BUN: 9 mg/dL (ref 8–27)
Bilirubin Total: 0.3 mg/dL (ref 0.0–1.2)
CO2: 19 mmol/L — ABNORMAL LOW (ref 20–29)
Calcium: 9.2 mg/dL (ref 8.7–10.3)
Chloride: 105 mmol/L (ref 96–106)
Creatinine, Ser: 0.84 mg/dL (ref 0.57–1.00)
Globulin, Total: 2.8 g/dL (ref 1.5–4.5)
Glucose: 159 mg/dL — ABNORMAL HIGH (ref 70–99)
Potassium: 4.1 mmol/L (ref 3.5–5.2)
Sodium: 141 mmol/L (ref 134–144)
Total Protein: 6.5 g/dL (ref 6.0–8.5)
eGFR: 72 mL/min/1.73

## 2024-02-04 LAB — LIPID PANEL
Chol/HDL Ratio: 2.7 ratio (ref 0.0–4.4)
Cholesterol, Total: 113 mg/dL (ref 100–199)
HDL: 42 mg/dL (ref >39)
LDL Chol Calc (NIH): 53 mg/dL (ref 0–99)
Triglycerides: 96 mg/dL (ref 0–149)
VLDL Cholesterol Cal: 18 mg/dL (ref 5–40)

## 2024-02-04 NOTE — Progress Notes (Signed)
 Potassium and kidney function remain stable. Potassium level has improved since hospital discharge. Continue current medication regimen without changes at this time.

## 2024-02-21 ENCOUNTER — Ambulatory Visit: Admitting: Pulmonary Disease

## 2024-02-21 ENCOUNTER — Encounter: Payer: Self-pay | Admitting: Pulmonary Disease

## 2024-02-21 VITALS — BP 106/66 | HR 57 | Temp 97.8°F | Ht 65.0 in | Wt 155.0 lb

## 2024-02-21 DIAGNOSIS — Z23 Encounter for immunization: Secondary | ICD-10-CM | POA: Diagnosis not present

## 2024-02-21 DIAGNOSIS — R911 Solitary pulmonary nodule: Secondary | ICD-10-CM

## 2024-02-21 NOTE — Progress Notes (Unsigned)
 Subjective:    Patient ID: Erica Estrada, female    DOB: Aug 22, 1950, 73 y.o.   MRN: [id number]  Patient Care Team: Buren Rock HERO, MD as PCP - General (Family Medicine) Darliss Rogue, MD as PCP - Cardiology (Cardiology) Tamea Dedra CROME, MD as Consulting Physician (Pulmonary Disease)  Chief Complaint  Patient presents with   Lung Mass    cT scan done on 01/11/2024. Occasional shortness of breath on exertion.     BACKGROUND:   HPI    Review of Systems A 10 point review of systems was performed and it is as noted above otherwise negative.   Past Medical History:  Diagnosis Date   Arthritis    Asthma    Coronary artery calcification    Noted on prior CTA chest (01/2021)   Depression    Diabetes mellitus without complication (HCC)    Former tobacco use    GERD (gastroesophageal reflux disease)    Graves' disease    Headache    Hepatitis 1995   Hep C.  Treated.   Hyperlipidemia    Hypertension    Multiple thyroid  nodules    Precordial chest pain    MV 02/2021 low risk, no significant ischemia    Past Surgical History:  Procedure Laterality Date   ABDOMINAL HYSTERECTOMY     CHOLECYSTECTOMY     CORONARY ARTERY BYPASS GRAFT N/A 12/13/2022   Procedure: CORONARY ARTERY BYPASS GRAFTING (CABG) TIMES THREE UTILIZING LEFT INTERNAL MAMMARY ARTERY AND ENDOSCOPIC VEIN HARVEST RIGHT GREATER SAPHENOUS VEIN;  Surgeon: Lucas Dorise POUR, MD;  Location: [facility] OR;  Service: Open Heart Surgery;  Laterality: N/A;   EYE SURGERY Left    prosthetic eye put in   RIGHT/LEFT HEART CATH AND CORONARY ANGIOGRAPHY Bilateral 11/27/2022   Procedure: RIGHT/LEFT HEART CATH AND CORONARY ANGIOGRAPHY;  Surgeon: Mady Bruckner, MD;  Location: [facility] INVASIVE CV LAB;  Service: Cardiovascular;  Laterality: Bilateral;   TEE WITHOUT CARDIOVERSION N/A 12/13/2022   Procedure: TRANSESOPHAGEAL ECHOCARDIOGRAM;  Surgeon: Lucas Dorise POUR, MD;  Location: [facility] OR;  Service: Open Heart Surgery;   Laterality: N/A;   TONSILLECTOMY     removed as a child    Patient Active Problem List   Diagnosis Date Noted   Chronic cholecystitis 01/12/2024   Acute cholecystitis 01/12/2024   Post-op pain 12/26/2022   Pressure injury of skin 12/26/2022   Sedimentation rate elevation 12/25/2022   T wave inversion in EKG 12/25/2022   Chest pain 12/24/2022   Chronic diastolic CHF (congestive heart failure) (HCC) 12/24/2022   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   Abnormal stress test 11/27/2022   Shortness of breath 11/27/2022   Anemia 01/22/2021   Chest pain of uncertain etiology 01/21/2021   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   Posttraumatic stress disorder 12/23/2018   Severe recurrent major depression without psychotic features (HCC) 11/10/2017   Abdominal pain, chronic, generalized 09/20/2016   Hepatic steatosis 08/14/2016   Multiple thyroid  nodules 01/12/2016   Herniation of lumbar intervertebral disc with radiculopathy 05/20/2013   Degenerative disc disease 07/18/2011   Allergic rhinitis 06/21/2011   GERD (gastroesophageal reflux disease) 04/28/2011   Glaucoma suspect with open angle 04/17/2011   Depression 12/26/2010   Vision impairment 12/26/2010   Hypertension 12/26/2010   Hyperlipidemia with target LDL less than 70 12/26/2010   H/O: substance abuse (HCC) 12/26/2010   Diabetes mellitus, type 2 (HCC) 12/26/2010   Cervical stenosis of spine 12/26/2010    Family History  Problem Relation Age of Onset  Breast cancer Neg Hx     Social History   Tobacco Use   Smoking status: Former    Current packs/day: 0.00    Average packs/day: 0.5 packs/day for 33.0 years (16.5 ttl pk-yrs)    Types: Cigarettes    Start date: 08/11/1986    Quit date: 08/11/2019    Years since quitting: 4.5    Passive exposure: Past   Smokeless tobacco: Never  Substance Use Topics   Alcohol use: No    Allergies[1]  Active Medications[2]  Immunization History  Administered Date(s) Administered   Fluad  Trivalent(High Dose 65+) 12/19/2022   Hepatitis B, ADULT 11/19/2014, 12/29/2015   INFLUENZA, HIGH DOSE SEASONAL PF 11/19/2014, 12/29/2015   Influenza Inj Mdck Quad Pf 12/26/2017   Influenza,inj,Quad PF,6+ Mos 11/10/2013   Influenza-Unspecified 12/26/2010, 12/20/2011, 11/28/2012, 11/19/2014, 12/29/2015, 12/10/2021   Pneumococcal Conjugate-13 06/08/2014   Pneumococcal Polysaccharide-23 12/20/2011   Tdap 04/02/2012   Zoster, Live 10/27/2014        Objective:     Vitals:   02/21/24 0924  BP: 106/66  Pulse: (!) 57  Temp: 97.8 F (36.6 C)  Height: 5' 5 (1.651 m)  Weight: 155 lb (70.3 kg)  SpO2: 98%  TempSrc: Temporal  BMI (Calculated): 25.79     SpO2: 98 %  GENERAL: Well-developed well-nourished woman, no acute distress, ambulates with assistance of a cane.  No conversational dyspnea. HEAD: Normocephalic, atraumatic.  EYES: Prosthetic left eye. MOUTH: Very poor dentition, missing teeth, oral mucosa moist.  No thrush. NECK: Supple. No thyromegaly. Trachea midline. No JVD.  No adenopathy. PULMONARY: Good air entry bilaterally.  No adventitious sounds. CARDIOVASCULAR: S1 and S2. Regular rate and rhythm.  Grade 1/6 systolic ejection murmur left sternal border. ABDOMEN: Benign. MUSCULOSKELETAL: No joint deformity, no clubbing, no edema.  NEUROLOGIC: No focal deficit, no gait disturbance, speech is fluent. SKIN: Intact,warm,dry. PSYCH: Mood and behavior normal         Assessment & Plan:     ICD-10-CM   1. Nodule of upper lobe of right lung - 4mm  R91.1    BENIGN    2. Need for influenza vaccination  Z23       Orders Placed This Encounter  Procedures   Flu vaccine HIGH DOSE PF(Fluzone Trivalent)    No orders of the defined types were placed in this encounter.    Advised if symptoms do not improve or worsen, to please contact office for sooner follow up or seek emergency care.    I spent xxx minutes of dedicated to the care of this patient on the date of this  encounter to include pre-visit review of records, face-to-face time with the patient discussing conditions above, post visit ordering of testing, clinical documentation with the electronic health record, making appropriate referrals as documented, and communicating necessary findings to members of the patients care team.   C. Leita Sanders, MD Advanced Bronchoscopy P[facility]M [facility]    *This note was dictated using voice recognition software/Dragon.  Despite best efforts to proofread, errors can occur which can change the meaning. Any transcriptional errors that result from this process are unintentional and may not be fully corrected at the time of dictation.    [1]  Allergies Allergen Reactions   Zoster Vac Recomb Adjuvanted     Other Reaction(s): cellulits   Ace Inhibitors Cough    Chronic cough for 3 months. Trial off lisinopril starting 05/11/2011 effective in stopping cough.   Lisinopril Other (See Comments)   Fluticasone  Other (See  Comments)    choking  [2]  Current Meds  Medication Sig   acetaminophen  (TYLENOL ) 500 MG tablet Take 1-2 tablets (500-1,000 mg total) by mouth every 6 (six) hours as needed (for pain). Maximum dose of of 4000 mg (8 tablets) in a 24 hour period. Do not take with your NORCO/VICODIN because they contain acetaminophen .   Artificial Saliva (BIOTENE DRY MOUTH MOISTURIZING) SOLN as needed (dry mouth).   ASPERCREME LIDOCAINE  4 % CREA Apply 1 application  topically 2 (two) times daily as needed (pain). Back and knees.   aspirin  EC 81 MG tablet Take 81 mg by mouth daily. Swallow whole.   atorvastatin  (LIPITOR ) 80 MG tablet Take 1 tablet (80 mg total) by mouth daily.   calcium  carbonate (TUMS - DOSED IN MG ELEMENTAL CALCIUM ) 500 MG chewable tablet Chew 2 tablets by mouth 2 (two) times daily as needed for indigestion or heartburn.   camphor-menthol (SARNA) lotion Apply 1 application topically as needed for itching.   Cholecalciferol 25 MCG (1000  UT) capsule Take 1,000 Units by mouth daily.   cyclobenzaprine  (FLEXERIL ) 5 MG tablet Take 5 mg by mouth at bedtime.   docusate sodium  (COLACE) 100 MG capsule Take 100 mg by mouth daily as needed.   ezetimibe  (ZETIA ) 10 MG tablet Take 1 tablet (10 mg total) by mouth daily.   ferrous sulfate  325 (65 FE) MG tablet Take 1 tablet (325 mg total) by mouth every other day.   folic acid  (FOLVITE ) 1 MG tablet Take 1 mg by mouth daily.   furosemide  (LASIX ) 20 MG tablet Take one tablet daily as needed for weight gain of 2 lb overnight, swelling, or shortness of breath   gabapentin  (NEURONTIN ) 300 MG capsule Take 300 mg by mouth every evening.   HUMALOG  KWIKPEN 100 UNIT/ML KwikPen Inject 8 Units into the skin as needed (when glucose exceeds 120).   insulin  aspart (NOVOLOG  FLEXPEN) 100 UNIT/ML FlexPen Inject 8 Units into the skin every evening.   insulin  degludec (TRESIBA  FLEXTOUCH) 200 UNIT/ML FlexTouch Pen Inject 16 Units into the skin at bedtime.   isosorbide  mononitrate (IMDUR ) 30 MG 24 hr tablet Take 1 tablet (30 mg total) by mouth two times daily   JARDIANCE 25 MG TABS tablet Take 25 mg by mouth daily.   Lancets (ONETOUCH DELICA PLUS LANCET33G) MISC Apply topically.   metFORMIN  (GLUCOPHAGE -XR) 500 MG 24 hr tablet Take 1,000 mg by mouth 2 (two) times daily.   methimazole  (TAPAZOLE ) 5 MG tablet Take 2.5 mg by mouth daily. (Patient taking differently: Take 5 mg by mouth daily.)   metoprolol  tartrate (LOPRESSOR ) 25 MG tablet Take 0.5 tablets by mouth 2 times daily.   mometasone  (NASONEX ) 50 MCG/ACT nasal spray Place 2 sprays into the nose daily as needed.   omeprazole (PRILOSEC) 40 MG capsule Take 40 mg by mouth daily.   ONETOUCH ULTRA test strip USE AS DIRECTED FOR BLOOD SUGAR TESTING THREE TIMES DAILY FOR DIABETES E11.9   oxyCODONE -acetaminophen  (PERCOCET/ROXICET) 5-325 MG tablet Take 1 tablet by mouth every 6 (six) hours as needed for moderate pain (pain score 4-6) or severe pain (pain score 7-10).    PARoxetine  (PAXIL ) 40 MG tablet Take 1 tablet (40 mg total) by mouth every morning.   potassium chloride  SA (KLOR-CON  M) 20 MEQ tablet Take one tablet daily as needed for a weight gain of 2 pounds overnight, swelling, or shortness of breath when you take the Furosemide    sertraline  (ZOLOFT ) 25 MG tablet Take 25 mg by mouth daily.  SPIRIVA  HANDIHALER 18 MCG inhalation capsule Place 1 capsule into inhaler and inhale daily.   traZODone  (DESYREL ) 50 MG tablet Take 50 mg by mouth at bedtime.   VENTOLIN  HFA 108 (90 Base) MCG/ACT inhaler Inhale 2 puffs into the lungs every 6 (six) hours as needed.

## 2024-02-21 NOTE — Patient Instructions (Signed)
 VISIT SUMMARY:  Today, you came in for a follow-up regarding a lung nodule. A recent chest CT scan showed some scarring from a past episode of pneumonia, but no concerning findings. Your breathing is stable, though you experience some fatigue when walking and soreness in your leg bones at night. You also received your flu shot today.  YOUR PLAN:  -PULMONARY NODULE: A pulmonary nodule is a small, round growth in the lung. Your recent chest CT scan showed no concerning findings, and the scarring is likely from a past episode of pneumonia. Your breathing is generally good, with mild fatigue when walking, which is likely due to musculoskeletal issues. Your lungs are clear on examination. Please continue to follow up with your primary care physician for any additional concerns.  -INFLUENZA VACCINATION: Influenza, or the flu, is a contagious respiratory illness. You have not received a flu shot recently, so we administered the flu vaccination today to help protect you from the flu this season.  INSTRUCTIONS:  Please continue to follow up with your primary care physician for any additional concerns regarding your lung nodule and overall health. If you experience any new or worsening symptoms, contact your doctor immediately.

## 2024-02-22 ENCOUNTER — Encounter: Payer: Self-pay | Admitting: Pulmonary Disease

## 2024-02-24 ENCOUNTER — Ambulatory Visit: Admitting: Podiatry

## 2024-02-24 ENCOUNTER — Encounter: Payer: Self-pay | Admitting: Podiatry

## 2024-02-24 ENCOUNTER — Ambulatory Visit

## 2024-02-24 DIAGNOSIS — E1142 Type 2 diabetes mellitus with diabetic polyneuropathy: Secondary | ICD-10-CM

## 2024-02-24 DIAGNOSIS — M795 Residual foreign body in soft tissue: Secondary | ICD-10-CM

## 2024-02-24 DIAGNOSIS — B351 Tinea unguium: Secondary | ICD-10-CM

## 2024-03-01 NOTE — Progress Notes (Signed)
 "  Subjective:  Patient ID: Erica Estrada, female    DOB: [birth year],  MRN: [id number]  Erica Estrada presents to clinic today for at risk foot care with history of diabetic neuropathy and painful thick toenails that are difficult to trim. Pain interferes with ambulation. Aggravating factors include wearing enclosed shoe gear. Pain is relieved with periodic professional debridement. Patient states she has a lesion at the bottom of her foot which is painful. It has been present for several days. She does not remember stepping on any broken glass or splinter.  Chief Complaint  Patient presents with   Diabetes    A1c 7.2 She saw Dr. Buren in Oct. She states she has a mole on the bottom of her foot that is causing pain     PCP is Buren Rock HERO, MD.  Allergies[1]  Review of Systems: Negative except as noted in the HPI.  Objective: No changes noted in today's physical examination. There were no vitals filed for this visit. Erica Estrada is a pleasant 72 y.o. female WD, WN in NAD. AAO x 3.  Vascular Examination: CFT less than 3 seconds. DP/PT pulses 0/4 LLE. DP/ PT pulses palpable RLE. Digital hair absent. Skin temperature gradient warm to warn b/l. No ischemia or gangrene. No cyanosis or clubbing noted b/l.    Neurological Examination: Sensation grossly intact b/l with 10 gram monofilament. Vibratory sensation intact b/l. Pt has subjective symptoms of neuropathy.  Dermatological Examination: Evidence of visible localized superficial FBO submet head 2 left foot with tenderness to palpation. No surrounding erythema, no edema, no fluctuance.  Pedal skin thin, shiny and atrophic b/l. No interdigital macerations.   Toenails 1-5 b/l thick, discolored, elongated with subungual debris and pain on dorsal palpation.   Musculoskeletal Examination: Muscle strength 5/5 to all lower extremity muscle groups bilaterally. No pain, crepitus or joint limitation noted with ROM  bilateral LE. No gross bony deformities bilaterally.  Xray findings post removal FBO left foot: No gas in tissues left foot. No evidence of fracture left foot. No foreign body evident left foot.  Assessment/Plan: 1. Residual foreign body in soft tissue   2. Pain due to onychomycosis of toenails of both feet   3. Diabetic peripheral neuropathy associated with type 2 diabetes mellitus (HCC)    Patient was evaluated and treated. All patient's and/or POA's questions/concerns addressed on today's visit. Applied betadine solution to plantar aspect left foot. Superficial foreign body excised similar to splinter or thorn removed. Area cleansed with wound wash and alcohol. Triple antibiotic ointment and band-aid applied. She is to keep clean and apply antibiotic cream once daily until healed. Call office with any concerns.  3 views xrays left foot taken and reviewed with patient.   Toenails 1-5 b/l debrided in length and girth without incident. Continue foot and shoe inspections daily. Monitor blood glucose per PCP/Endocrinologist's recommendations. Continue soft, supportive shoe gear daily. Report any pedal injuries to medical professional. Call office if there are any questions/concerns.  Return in about 3 months (around 05/24/2024).  Delon LITTIE Merlin, DPM      [place] LOCATION: [address], [place] [address]  Office [phone]   [facility] LOCATION: [address] [place], [place] [address] Office [phone]     [1]  Allergies Allergen Reactions   Zoster Vac Recomb Adjuvanted     Other Reaction(s): cellulits   Ace Inhibitors Cough    Chronic cough for 3 months. Trial off lisinopril starting 05/11/2011 effective in stopping cough.   Lisinopril Other (See Comments)   Fluticasone  Other (See Comments)    choking   "

## 2024-03-10 ENCOUNTER — Other Ambulatory Visit: Payer: Self-pay | Admitting: Cardiology
